# Patient Record
Sex: Female | Born: 1935 | Race: White | Hispanic: No | Smoking: Never smoker
Health system: Southern US, Community
[De-identification: ages and names within clinical notes are randomized; demographics above are authoritative.]

## PROBLEM LIST (undated history)

## (undated) DIAGNOSIS — I951 Orthostatic hypotension: Secondary | ICD-10-CM

## (undated) DIAGNOSIS — I10 Essential (primary) hypertension: Secondary | ICD-10-CM

## (undated) DIAGNOSIS — M549 Dorsalgia, unspecified: Secondary | ICD-10-CM

## (undated) DIAGNOSIS — F32A Depression, unspecified: Secondary | ICD-10-CM

## (undated) DIAGNOSIS — K5900 Constipation, unspecified: Secondary | ICD-10-CM

## (undated) DIAGNOSIS — E785 Hyperlipidemia, unspecified: Secondary | ICD-10-CM

## (undated) DIAGNOSIS — R350 Frequency of micturition: Secondary | ICD-10-CM

## (undated) DIAGNOSIS — C4491 Basal cell carcinoma of skin, unspecified: Secondary | ICD-10-CM

## (undated) DIAGNOSIS — F419 Anxiety disorder, unspecified: Secondary | ICD-10-CM

## (undated) DIAGNOSIS — M1990 Unspecified osteoarthritis, unspecified site: Secondary | ICD-10-CM

## (undated) DIAGNOSIS — T8859XA Other complications of anesthesia, initial encounter: Secondary | ICD-10-CM

## (undated) DIAGNOSIS — F329 Major depressive disorder, single episode, unspecified: Secondary | ICD-10-CM

## (undated) HISTORY — PX: FRACTURE SURGERY: SHX138

## (undated) HISTORY — DX: Depression, unspecified: F32.A

## (undated) HISTORY — DX: Major depressive disorder, single episode, unspecified: F32.9

## (undated) HISTORY — DX: Essential (primary) hypertension: I10

## (undated) HISTORY — DX: Hyperlipidemia, unspecified: E78.5

## (undated) HISTORY — DX: Anxiety disorder, unspecified: F41.9

## (undated) HISTORY — DX: Constipation, unspecified: K59.00

## (undated) HISTORY — DX: Dorsalgia, unspecified: M54.9

## (undated) HISTORY — PX: CATARACT EXTRACTION W/ INTRAOCULAR LENS  IMPLANT, BILATERAL: SHX1307

## (undated) HISTORY — DX: Orthostatic hypotension: I95.1

## (undated) HISTORY — PX: DILATION AND CURETTAGE OF UTERUS: SHX78

---

## 1996-01-06 HISTORY — PX: ANKLE FRACTURE SURGERY: SHX122

## 2003-01-06 HISTORY — PX: WRIST FRACTURE SURGERY: SHX121

## 2003-10-09 ENCOUNTER — Ambulatory Visit: Payer: Self-pay | Admitting: Ophthalmology

## 2004-10-07 ENCOUNTER — Ambulatory Visit: Payer: Self-pay | Admitting: Ophthalmology

## 2010-04-09 ENCOUNTER — Emergency Department: Payer: Self-pay | Admitting: Unknown Physician Specialty

## 2010-05-12 ENCOUNTER — Ambulatory Visit: Payer: Self-pay | Admitting: Obstetrics and Gynecology

## 2010-06-06 DIAGNOSIS — I951 Orthostatic hypotension: Secondary | ICD-10-CM

## 2010-06-06 HISTORY — DX: Orthostatic hypotension: I95.1

## 2010-06-11 ENCOUNTER — Ambulatory Visit: Payer: Self-pay | Admitting: Internal Medicine

## 2010-08-01 LAB — HM MAMMOGRAPHY: HM Mammogram: NORMAL

## 2010-08-29 ENCOUNTER — Other Ambulatory Visit: Payer: Self-pay | Admitting: Internal Medicine

## 2010-08-29 NOTE — Telephone Encounter (Signed)
OK to refill hydroxyzine HCL 25 mg one tablet every 8 hours as needed for itching  #90 3 refills

## 2010-08-29 NOTE — Telephone Encounter (Signed)
Patient called and stated you prescribed her hydroxyzine 25 mg but she would rather have hydroxyzine HCL that her previous doctor prescribed.  She stated she doesn't like the way the other made her feel and the HCL was much better.  Please advise

## 2010-09-01 MED ORDER — HYDROXYZINE HCL 25 MG PO TABS: 25.0000 mg | ORAL_TABLET | Freq: Three times a day (TID) | ORAL | Status: AC | PRN

## 2010-09-01 NOTE — Telephone Encounter (Signed)
Rx has been called in  

## 2010-09-01 NOTE — Telephone Encounter (Signed)
Addended by: Darletta Moll A on: 09/01/2010 03:52 PM   Modules accepted: Orders

## 2010-09-18 ENCOUNTER — Encounter: Payer: Self-pay | Admitting: Internal Medicine

## 2010-09-18 ENCOUNTER — Ambulatory Visit (INDEPENDENT_AMBULATORY_CARE_PROVIDER_SITE_OTHER): Payer: Medicare Other | Admitting: Internal Medicine

## 2010-09-18 VITALS — BP 156/126 | HR 67 | Temp 97.7°F | Resp 16 | Wt 123.0 lb

## 2010-09-18 DIAGNOSIS — F411 Generalized anxiety disorder: Secondary | ICD-10-CM

## 2010-09-18 DIAGNOSIS — G252 Other specified forms of tremor: Secondary | ICD-10-CM

## 2010-09-18 DIAGNOSIS — I10 Essential (primary) hypertension: Secondary | ICD-10-CM | POA: Insufficient documentation

## 2010-09-18 DIAGNOSIS — R030 Elevated blood-pressure reading, without diagnosis of hypertension: Secondary | ICD-10-CM

## 2010-09-18 DIAGNOSIS — F419 Anxiety disorder, unspecified: Secondary | ICD-10-CM

## 2010-09-18 DIAGNOSIS — R251 Tremor, unspecified: Secondary | ICD-10-CM

## 2010-09-18 DIAGNOSIS — M533 Sacrococcygeal disorders, not elsewhere classified: Secondary | ICD-10-CM

## 2010-09-18 DIAGNOSIS — R259 Unspecified abnormal involuntary movements: Secondary | ICD-10-CM

## 2010-09-18 DIAGNOSIS — I951 Orthostatic hypotension: Secondary | ICD-10-CM

## 2010-09-18 MED ORDER — CLORAZEPATE DIPOTASSIUM 7.5 MG PO TABS: ORAL_TABLET | ORAL | Status: DC

## 2010-09-18 NOTE — Progress Notes (Signed)
  Subjective:    Patient ID: [name], female    DOB: 25-May-1926, 77 y.o.   MRN: [id number]  HPI  77 yo white femalewith recent workup for isolated syncopal event which occurred in the setting of recent physical exhaustion, normal workup except for mild dehydration and chronic orthostasis, presents with fatigue (chronic) and difficulty rising from the recliner at home and occasional loss of balance without falls.  No recurrent syncope,  No dragging one foot. Also reporting left lateral thigh pain, with no recent fall but relates a history of remote fall onto hip 30 yrs ago without fracture.   Past Medical History  Diagnosis Date  . Chronic orthostatic hypotension June 2012    with pprior syncopal event,  did not tolelrate florinef trial  . Anxiety     No current outpatient prescriptions on file prior to visit.     Review of Systems  Constitutional: Positive for fatigue. Negative for fever, chills and unexpected weight change.  HENT: Negative for hearing loss, ear pain, nosebleeds, congestion, sore throat, facial swelling, rhinorrhea, sneezing, mouth sores, trouble swallowing, neck pain, neck stiffness, voice change, postnasal drip, sinus pressure, tinnitus and ear discharge.   Eyes: Negative for pain, discharge, redness and visual disturbance.  Respiratory: Negative for cough, chest tightness, shortness of breath, wheezing and stridor.   Cardiovascular: Negative for chest pain, palpitations and leg swelling.  Musculoskeletal: Positive for arthralgias and gait problem. Negative for myalgias.  Skin: Negative for color change and rash.  Neurological: Positive for tremors and light-headedness. Negative for dizziness, weakness and headaches.  Hematological: Negative for adenopathy.  Psychiatric/Behavioral: The patient is nervous/anxious.    BP 156/126  Pulse 67  Temp(Src) 97.7 F (36.5 C) (Oral)  Resp 16  Wt 123 lb (55.792 kg)  SpO2 98%     Objective:   Physical Exam    Constitutional: She is oriented to person, place, and time. She appears well-developed and well-nourished.  HENT:  Mouth/Throat: Oropharynx is clear and moist.  Eyes: EOM are normal. Pupils are equal, round, and reactive to light. No scleral icterus.  Neck: Normal range of motion. Neck supple. No JVD present. No thyromegaly present.  Cardiovascular: Normal rate, regular rhythm, normal heart sounds and intact distal pulses.   Pulmonary/Chest: Effort normal and breath sounds normal.  Abdominal: Soft. Bowel sounds are normal. She exhibits no mass. There is no tenderness.  Musculoskeletal: Normal range of motion. She exhibits no edema.       Right shoulder: She exhibits normal range of motion, no tenderness, no swelling, no effusion, no crepitus, no deformity, no pain, no spasm and normal strength.       Right hip: She exhibits normal range of motion, normal strength, no swelling and no laceration.       Left hip: She exhibits normal range of motion, no swelling, no deformity and no laceration.  Lymphadenopathy:    She has no cervical adenopathy.  Neurological: She is alert and oriented to person, place, and time. She displays tremor. She displays normal reflexes. No cranial nerve deficit. Coordination normal.       Tremor noted of head and hands  Skin: Skin is warm and dry.  Psychiatric: She has a normal mood and affect.          Assessment & Plan:

## 2010-09-18 NOTE — Patient Instructions (Signed)
You should only use the clorazepate as needed for anxiety or insomnia, not more than 2 pills in 24 hours.  The hydroxyzine is for itching but also causes sedation.     Your hip pain appears to be coming from your sacroiliac joint having arthritis. You may use up to 4 or 6 tylenol daily and either one Alleve daily  or 2 ibuprofen every 8 hours (but not both).  Your tremor and your balance may be due to anxiety or possibly from Parkinsons's Disease and if you would like to see a neurologist we will schedule you to see one.

## 2010-09-21 ENCOUNTER — Encounter: Payer: Self-pay | Admitting: Internal Medicine

## 2010-09-21 DIAGNOSIS — I951 Orthostatic hypotension: Secondary | ICD-10-CM | POA: Insufficient documentation

## 2010-09-21 DIAGNOSIS — F419 Anxiety disorder, unspecified: Secondary | ICD-10-CM | POA: Insufficient documentation

## 2010-09-21 NOTE — Assessment & Plan Note (Signed)
She did not tolerate prior trial of Florinef.  Recurrent measurements show drops in systolic but not below 100, and she has had no recurrence of syncope.

## 2010-09-21 NOTE — Assessment & Plan Note (Addendum)
aggravated by her care of ailing husband.  Dicussed trial of SSRI but she is not interested and prefers to use Librium pr insomnia .

## 2010-09-21 NOTE — Assessment & Plan Note (Signed)
Her neurologic and MSK exam is essentially normal except for her coarse tremor of head and hands.  Given her reports of loss of balance, we discussed the possibility that she may be developing Parkinson's' Disease and a Neurology evaluation was offered but deferred.

## 2010-10-15 ENCOUNTER — Encounter: Payer: Self-pay | Admitting: Internal Medicine

## 2010-10-20 ENCOUNTER — Telehealth: Payer: Self-pay | Admitting: Internal Medicine

## 2010-10-20 NOTE — Telephone Encounter (Signed)
Tried calling patient, but line was busy.  Will try again later.

## 2010-10-20 NOTE — Telephone Encounter (Signed)
Pt called wanted appointment gave her 10/28/10 @ 2:15.  Pt wants early in sooner.  She unsteady on her feet, wobbly when walking balance off, has problems with memory.  Both symptons has gotten worse in last month.  Pt has had a small tia in April.  Is it possible to see her earlier than 10/23

## 2010-10-28 ENCOUNTER — Ambulatory Visit (INDEPENDENT_AMBULATORY_CARE_PROVIDER_SITE_OTHER): Payer: Medicare Other | Admitting: Internal Medicine

## 2010-10-28 ENCOUNTER — Encounter: Payer: Self-pay | Admitting: Internal Medicine

## 2010-10-28 DIAGNOSIS — E785 Hyperlipidemia, unspecified: Secondary | ICD-10-CM

## 2010-10-28 DIAGNOSIS — R5383 Other fatigue: Secondary | ICD-10-CM

## 2010-10-28 DIAGNOSIS — M533 Sacrococcygeal disorders, not elsewhere classified: Secondary | ICD-10-CM

## 2010-10-28 DIAGNOSIS — R5381 Other malaise: Secondary | ICD-10-CM

## 2010-10-28 DIAGNOSIS — R030 Elevated blood-pressure reading, without diagnosis of hypertension: Secondary | ICD-10-CM

## 2010-10-28 DIAGNOSIS — I951 Orthostatic hypotension: Secondary | ICD-10-CM

## 2010-10-28 NOTE — Progress Notes (Signed)
Subjective:    Patient ID: [name], female    DOB: May 15, 1926, 77 y.o.   MRN: [id number]  HPI  77 yo white femae with 6 to 8l month history of dizziness and fatigue,  Originally found to be dehydrated, orthostatic,  workup done previously without cardiology evaluation given normal cardiac exam, presents with continued fatigue. " I feel weak and give out."   She has persistent mild back and hip pain which radiates down the back of her leg but not below the knee.  Aggravated by prlonged stopping or kneeling associated with yard work and  [employer], which she continues,  Including cutting up wood in the yard. . She also reports persistent anxiety over the health of her husband and is using clorazepate once during the day and again in the evening for insomna.  She denies chest pain , cough, orthopnea, edema, and shortness of breath.  No weight loss, fevers or night sweats.   Past Medical History  Diagnosis Date  . Chronic orthostatic hypotension June 2012    with pprior syncopal event,  did not tolelrate florinef trial  . Anxiety     Current Outpatient Prescriptions on File Prior to Visit  Medication Sig Dispense Refill  . aspirin 81 MG tablet Take 81 mg by mouth daily.        . clorazepate (TRANXENE) 7.5 MG tablet Take 1.5 tablets daily and at bedtime as needed for anxiety  90 tablet  2  . hydrOXYzine (ATARAX) 25 MG tablet Take 25 mg by mouth every 6 (six) hours as needed.        . Iron-Vitamins (GERITOL PO) Take by mouth.          Review of Systems  Constitutional: Positive for fatigue. Negative for fever, chills and unexpected weight change.  HENT: Negative for hearing loss, ear pain, nosebleeds, congestion, sore throat, facial swelling, rhinorrhea, sneezing, mouth sores, trouble swallowing, neck pain, neck stiffness, voice change, postnasal drip, sinus pressure, tinnitus and ear discharge.   Eyes: Negative for pain, discharge, redness and visual disturbance.  Respiratory: Negative for  cough, chest tightness, shortness of breath, wheezing and stridor.   Cardiovascular: Negative for chest pain, palpitations and leg swelling.  Musculoskeletal: Positive for back pain. Negative for myalgias and arthralgias.  Skin: Negative for color change and rash.  Neurological: Negative for dizziness, weakness, light-headedness and headaches.  Hematological: Negative for adenopathy.  Psychiatric/Behavioral: Positive for sleep disturbance. The patient is nervous/anxious.        Objective:   Physical Exam  Constitutional: She is oriented to person, place, and time. She appears well-developed and well-nourished.  HENT:  Mouth/Throat: Oropharynx is clear and moist.  Eyes: EOM are normal. Pupils are equal, round, and reactive to light. No scleral icterus.  Neck: Normal range of motion. Neck supple. No JVD present. No thyromegaly present.  Cardiovascular: Normal rate, regular rhythm, normal heart sounds and intact distal pulses.   Pulmonary/Chest: Effort normal and breath sounds normal.  Abdominal: Soft. Bowel sounds are normal. She exhibits no mass. There is no tenderness.  Musculoskeletal: Normal range of motion. She exhibits no edema.  Lymphadenopathy:    She has no cervical adenopathy.  Neurological: She is alert and oriented to person, place, and time.  Skin: Skin is warm and dry.  Psychiatric: She has a normal mood and affect.       Assessment & Plan:  Fatigue:  Thyroid, anemia, liver and kidney dysfunction ruled out.  Given persistent symptoms will refer to Cardiology  for evaluation.   Hyperlipidemia:  She is tolerating pravastatin started at last vist 3 months ago without myalgias.  Repeat lipids are due.

## 2010-10-28 NOTE — Patient Instructions (Signed)
Return this week for fasting lipids  We will refer you to Dr. Mariah Milling for a heart evaluation

## 2010-10-29 ENCOUNTER — Other Ambulatory Visit (INDEPENDENT_AMBULATORY_CARE_PROVIDER_SITE_OTHER): Payer: Medicare Other | Admitting: *Deleted

## 2010-10-29 DIAGNOSIS — E785 Hyperlipidemia, unspecified: Secondary | ICD-10-CM

## 2010-10-29 DIAGNOSIS — R5383 Other fatigue: Secondary | ICD-10-CM

## 2010-10-29 DIAGNOSIS — R5381 Other malaise: Secondary | ICD-10-CM

## 2010-10-29 LAB — COMPREHENSIVE METABOLIC PANEL
ALT: 13 U/L (ref 0–35)
AST: 23 U/L (ref 0–37)
Albumin: 4.1 g/dL (ref 3.5–5.2)
Alkaline Phosphatase: 44 U/L (ref 39–117)
BUN: 13 mg/dL (ref 6–23)
CO2: 30 mEq/L (ref 19–32)
Calcium: 9.6 mg/dL (ref 8.4–10.5)
Chloride: 106 mEq/L (ref 96–112)
Creatinine, Ser: 0.9 mg/dL (ref 0.4–1.2)
GFR: 63.73 mL/min (ref >60.00)
Glucose, Bld: 96 mg/dL (ref 70–99)
Potassium: 4.6 mEq/L (ref 3.5–5.1)
Sodium: 141 mEq/L (ref 135–145)
Total Bilirubin: 0.6 mg/dL (ref 0.3–1.2)
Total Protein: 6.7 g/dL (ref 6.0–8.3)

## 2010-10-29 LAB — LIPID PANEL
Cholesterol: 248 mg/dL — ABNORMAL HIGH (ref 0–200)
HDL: 58.3 mg/dL (ref >39.00)
Total CHOL/HDL Ratio: 4
Triglycerides: 111 mg/dL (ref 0.0–149.0)
VLDL: 22.2 mg/dL (ref 0.0–40.0)

## 2010-10-29 LAB — CBC WITH DIFFERENTIAL/PLATELET
Basophils Absolute: 0 10*3/uL (ref 0.0–0.1)
Basophils Relative: 0.5 % (ref 0.0–3.0)
Eosinophils Absolute: 0.1 10*3/uL (ref 0.0–0.7)
Eosinophils Relative: 2.8 % (ref 0.0–5.0)
HCT: 38.8 % (ref 36.0–46.0)
Hemoglobin: 13.3 g/dL (ref 12.0–15.0)
Lymphocytes Relative: 33.3 % (ref 12.0–46.0)
Lymphs Abs: 1.6 10*3/uL (ref 0.7–4.0)
MCHC: 34.3 g/dL (ref 30.0–36.0)
MCV: 96.2 fl (ref 78.0–100.0)
Monocytes Absolute: 0.4 10*3/uL (ref 0.1–1.0)
Monocytes Relative: 8.3 % (ref 3.0–12.0)
Neutro Abs: 2.6 10*3/uL (ref 1.4–7.7)
Neutrophils Relative %: 55.1 % (ref 43.0–77.0)
Platelets: 221 10*3/uL (ref 150.0–400.0)
RBC: 4.03 Mil/uL (ref 3.87–5.11)
RDW: 13.4 % (ref 11.5–14.6)
WBC: 4.7 10*3/uL (ref 4.5–10.5)

## 2010-10-29 LAB — LDL CHOLESTEROL, DIRECT: Direct LDL: 174.6 mg/dL

## 2010-10-29 LAB — TSH: TSH: 1.78 u[IU]/mL (ref 0.35–5.50)

## 2010-10-30 ENCOUNTER — Other Ambulatory Visit: Payer: Self-pay | Admitting: *Deleted

## 2010-10-30 MED ORDER — PRAVASTATIN SODIUM 20 MG PO TABS: 20.0000 mg | ORAL_TABLET | Freq: Every day | ORAL | Status: DC

## 2010-11-08 ENCOUNTER — Encounter: Payer: Self-pay | Admitting: Internal Medicine

## 2010-11-08 ENCOUNTER — Telehealth: Payer: Self-pay | Admitting: Internal Medicine

## 2010-11-08 DIAGNOSIS — M533 Sacrococcygeal disorders, not elsewhere classified: Secondary | ICD-10-CM | POA: Insufficient documentation

## 2010-11-08 DIAGNOSIS — E785 Hyperlipidemia, unspecified: Secondary | ICD-10-CM | POA: Insufficient documentation

## 2010-11-08 NOTE — Assessment & Plan Note (Signed)
She has no weakness on exam, is able to rise from a chair without using her hands, and has a negative straight leg lift .  Continue tyleno and aleve for symptoms.

## 2010-11-08 NOTE — Assessment & Plan Note (Addendum)
Pravastatin was started in June for LDL of 166 .  Repeat lipids suggeast that she has not been taking it as prescribed.  Will review with patient and increase dose if she is actually taking it

## 2010-11-08 NOTE — Telephone Encounter (Signed)
Her cholesterol is even higher, has she been taking the pravastatin daily as prescribed?  Also has she receoved a call about the referral to Dr. Mariah Milling for a cardioogy evaluation?

## 2010-11-08 NOTE — Assessment & Plan Note (Signed)
She did not tolerate a trial of florinef several months ago and has had no more syncopal episodes,  AM cortisol was normal.

## 2010-11-08 NOTE — Assessment & Plan Note (Signed)
Repeat bp today is normal.  

## 2010-11-10 NOTE — Telephone Encounter (Signed)
Patient notified. She says that she has been taking the pravastatin daily. Also she has received the call for the referral and her appt is tomorrow with Dr. Mariah Milling.

## 2010-11-11 ENCOUNTER — Ambulatory Visit (INDEPENDENT_AMBULATORY_CARE_PROVIDER_SITE_OTHER): Payer: Medicare Other | Admitting: Cardiovascular Disease

## 2010-11-11 ENCOUNTER — Encounter: Payer: Self-pay | Admitting: Cardiovascular Disease

## 2010-11-11 VITALS — BP 130/60 | HR 78 | Ht 68.0 in | Wt 125.0 lb

## 2010-11-11 DIAGNOSIS — R55 Syncope and collapse: Secondary | ICD-10-CM

## 2010-11-11 DIAGNOSIS — E785 Hyperlipidemia, unspecified: Secondary | ICD-10-CM

## 2010-11-11 NOTE — Patient Instructions (Signed)
You are doing well. No medication changes were made.  Please call us if you have new issues that need to be addressed before your next appt.  The office will contact you for a follow up Appt. In 6 months  

## 2010-11-12 DIAGNOSIS — R55 Syncope and collapse: Secondary | ICD-10-CM | POA: Insufficient documentation

## 2010-11-12 DIAGNOSIS — E785 Hyperlipidemia, unspecified: Secondary | ICD-10-CM | POA: Insufficient documentation

## 2010-11-12 NOTE — Assessment & Plan Note (Signed)
We have encouraged her to stay on the statin given total chol. >200

## 2010-11-12 NOTE — Progress Notes (Signed)
Patient ID: [name], female    DOB: September 08, 1926, 77 y.o.   MRN: [id number]  HPI Comments: [name] is a please 77 yo woman, patient of Dr. Darrick Huntsman, with a hx of hyperlipidemia, who is very active at baseline, with a hx of near syncope after standing up from her breakfast table, who presents for further evaluation.  She reports that she "works hard", and frequently mows, and helps to cut trees among other activities. It was during a period over several days, that she had been very active that she had her near syncope epsiode. She does report having minimal po fluid intake. She had been taking allergy pills for a "feeling that her head felt big". She also reports losing weight, approximately 10 lbs. She reports eating, though "probably not enough". She attributes some of the weight loss to working hard. She stood up from the dinning room table and almost "blacked out". She sat back down and felt sweaty with left tingling. Symptoms quickly passed. She went to urgent care and was told that she had a UTI.    She does have  has persistent mild back and hip pain which radiates down the back of her leg. . She also reports anxiety over the health of her husband.  She denies chest pain , cough, orthopnea, edema, and shortness of breath.  No weight loss, fevers or night sweats.   EKG shows NSR with no significant ST or T wave changes    Outpatient Encounter Prescriptions as of 11/11/2010  Medication Sig Dispense Refill  . aspirin 81 MG tablet Take 81 mg by mouth daily.        . clorazepate (TRANXENE) 7.5 MG tablet Take 1.5 tablets daily and at bedtime as needed for anxiety  90 tablet  2  . Ginkgo Biloba 120 MG CAPS Take 120 mg by mouth daily.        . hydrOXYzine (ATARAX) 25 MG tablet Take 25 mg by mouth every 6 (six) hours as needed.        . Iron-Vitamins (GERITOL PO) Take by mouth.        . Lutein 20 MG CAPS Take 20 mg by mouth daily.        . magnesium oxide (MAG-OX) 400 MG tablet Take 400 mg by  mouth daily.        [name] POTASSIUM GLUCONATE PO Take by mouth.        . pravastatin (PRAVACHOL) 20 MG tablet Take 1 tablet (20 mg total) by mouth daily.  90 tablet  2  . vitamin B-12 (CYANOCOBALAMIN) 1000 MCG tablet Take 1,000 mcg by mouth daily.        . vitamin E 400 UNIT capsule Take 400 Units by mouth daily.           Review of Systems  Constitutional: Negative.   HENT: Negative.   Eyes: Negative.   Respiratory: Negative.   Cardiovascular: Negative.   Gastrointestinal: Negative.   Musculoskeletal: Negative.   Skin: Negative.   Neurological:       Near syncope  Hematological: Negative.   Psychiatric/Behavioral: Negative.   All other systems reviewed and are negative.    BP 130/60  Pulse 78  Ht 5\' 8"  (1.727 m)  Wt 125 lb (56.7 kg)  BMI 19.01 kg/m2   Physical Exam  Nursing note and vitals reviewed. Constitutional: She is oriented to person, place, and time. She appears well-developed and well-nourished.  HENT:  Head: Normocephalic.  Nose: Nose normal.  Mouth/Throat: Oropharynx is clear and moist.  Eyes: Conjunctivae are normal. Pupils are equal, round, and reactive to light.  Neck: Normal range of motion. Neck supple. No JVD present.  Cardiovascular: Normal rate, regular rhythm, S1 normal, S2 normal, normal heart sounds and intact distal pulses.  Exam reveals no gallop and no friction rub.   No murmur heard. Pulmonary/Chest: Effort normal and breath sounds normal. No respiratory distress. She has no wheezes. She has no rales. She exhibits no tenderness.  Abdominal: Soft. Bowel sounds are normal. She exhibits no distension. There is no tenderness.  Musculoskeletal: Normal range of motion. She exhibits no edema and no tenderness.  Lymphadenopathy:    She has no cervical adenopathy.  Neurological: She is alert and oriented to person, place, and time. Coordination normal.  Skin: Skin is warm and dry. No rash noted. No erythema.  Psychiatric: She has a normal mood and  affect. Her behavior is normal. Judgment and thought content normal.         Assessment and Plan

## 2010-11-12 NOTE — Assessment & Plan Note (Signed)
Previous episode of near syncope. No further episodes.We have suggested he try to stay more hydrated. She is not conscious of her fluids or eating enough calories (recent weight loss). We have enouraged her that if she would like to stay as active as she has been, that she will need to take time to eat and drink, avoid over-doing it.  If she has additional episodes, we could consider additional workup including echo/holter.

## 2010-11-14 ENCOUNTER — Ambulatory Visit: Payer: Medicare Other | Admitting: Internal Medicine

## 2010-11-25 ENCOUNTER — Ambulatory Visit (INDEPENDENT_AMBULATORY_CARE_PROVIDER_SITE_OTHER): Payer: Medicare Other | Admitting: Internal Medicine

## 2010-11-25 ENCOUNTER — Encounter: Payer: Self-pay | Admitting: Internal Medicine

## 2010-11-25 VITALS — BP 146/68 | HR 70 | Temp 98.1°F | Resp 16 | Ht 67.0 in | Wt 127.2 lb

## 2010-11-25 DIAGNOSIS — R413 Other amnesia: Secondary | ICD-10-CM

## 2010-11-25 DIAGNOSIS — F419 Anxiety disorder, unspecified: Secondary | ICD-10-CM

## 2010-11-25 DIAGNOSIS — F411 Generalized anxiety disorder: Secondary | ICD-10-CM

## 2010-11-25 DIAGNOSIS — M79602 Pain in left arm: Secondary | ICD-10-CM

## 2010-11-25 DIAGNOSIS — M79609 Pain in unspecified limb: Secondary | ICD-10-CM

## 2010-11-25 NOTE — Patient Instructions (Signed)
I do recommend that you consider therapy with paxil or zoloft to help  your anxiety   I have you given you samples of Pristiq to see if an antidepressant will help.  Take it daily for one month,  Unless it makes you feel bad.   If it helps,  We will transition to a generic but similar medication (paxil or zoloft)   It would be ok to take and additional half of clorazepate if you wake up at 1:30 am with insomnia.  If your chest or arm symptomss become troublesome,  We will schedule a stress test.

## 2010-11-25 NOTE — Progress Notes (Signed)
Subjective:    Patient ID: [name], female    DOB: Mar 12, 1926, 77 y.o.   MRN: [id number]  HPI  [name] returns to discuss her concerns about her feelings of "craziness."  She was recently evaluated by Dr. Mariah Milling for persistent fatigue and presyncope, given her normal noncardiac workup int he past year. He agrreed that in the setting of vigorous outdoor activity and advance age, dehydration was likely a major componenet in her symptoms and deferred cardiac stress test and ECHO for now.  Patient now states that she has had an isolated recent episode of left hand and arm pain that woke her from sleep , lasted 30 minutes, and resolved spontaneously.  Also reporting separate occasion  of chest numbness brought on by lying down on her right arm.     Regarding her "crazinness," she recalls an episode in 1998 of getting lost while driving to a neice's house from her home in [place] to [place].  She is worried that she may have had a blackout.  A 2nd episode occurred several years ago while driving with a friend and missing her turn while recounting a recent trip to  [facility].     She is anxious about her husband's health,  Which apparently is poor due to ischemic cardiomyopathy and leg weakness which has limited his ambulation and is presumed to be neuropathy.  Past Medical History  Diagnosis Date  . Chronic orthostatic hypotension June 2012    with pprior syncopal event,  did not tolelrate florinef trial  . Anxiety   . Hyperlipidemia     Current Outpatient Prescriptions on File Prior to Visit  Medication Sig Dispense Refill  . aspirin 81 MG tablet Take 81 mg by mouth daily.        . clorazepate (TRANXENE) 7.5 MG tablet Take 1.5 tablets daily and at bedtime as needed for anxiety  90 tablet  2  . Ginkgo Biloba 120 MG CAPS Take 120 mg by mouth daily.        . hydrOXYzine (ATARAX) 25 MG tablet Take 25 mg by mouth every 6 (six) hours as needed.        . Iron-Vitamins  (GERITOL PO) Take by mouth.        . Lutein 20 MG CAPS Take 20 mg by mouth daily.        . magnesium oxide (MAG-OX) 400 MG tablet Take 400 mg by mouth daily.        [name] POTASSIUM GLUCONATE PO Take by mouth.        . pravastatin (PRAVACHOL) 20 MG tablet Take 1 tablet (20 mg total) by mouth daily.  90 tablet  2  . vitamin B-12 (CYANOCOBALAMIN) 1000 MCG tablet Take 1,000 mcg by mouth daily.        . vitamin E 400 UNIT capsule Take 400 Units by mouth daily.          Review of Systems  Constitutional: Positive for fatigue. Negative for fever, chills and unexpected weight change.  HENT: Negative for hearing loss, ear pain, nosebleeds, congestion, sore throat, facial swelling, rhinorrhea, sneezing, mouth sores, trouble swallowing, neck pain, neck stiffness, voice change, postnasal drip, sinus pressure, tinnitus and ear discharge.   Eyes: Negative for pain, discharge, redness and visual disturbance.  Respiratory: Negative for cough, chest tightness, shortness of breath, wheezing and stridor.   Cardiovascular: Negative for chest pain, palpitations and leg swelling.  Musculoskeletal: Positive for back pain. Negative for myalgias and arthralgias.  Skin: Negative for color change and rash.  Neurological: Negative for dizziness, weakness, light-headedness and headaches.  Hematological: Negative for adenopathy.  Psychiatric/Behavioral: Positive for sleep disturbance. The patient is nervous/anxious.        Objective:   Physical Exam  Constitutional: She is oriented to person, place, and time. She appears well-developed and well-nourished.  HENT:  Mouth/Throat: Oropharynx is clear and moist.  Eyes: EOM are normal. Pupils are equal, round, and reactive to light. No scleral icterus.  Neck: Normal range of motion. Neck supple. No JVD present. No thyromegaly present.  Cardiovascular: Normal rate, regular rhythm, normal heart sounds and intact distal pulses.   Pulmonary/Chest: Effort normal and breath sounds  normal.  Abdominal: Soft. Bowel sounds are normal. She exhibits no mass. There is no tenderness.  Musculoskeletal: Normal range of motion. She exhibits no edema.  Lymphadenopathy:    She has no cervical adenopathy.  Neurological: She is alert and oriented to person, place, and time.  Skin: Skin is warm and dry.  Psychiatric: Her speech is normal and behavior is normal. Judgment and thought content normal. Her mood appears anxious. She exhibits abnormal recent memory.          Assessment & Plan:  Chest pain, arm pain:  Given her isolated episodes,  Generally healthy condition , and lack of symptoms brought on by chopping wood and moqwing grass, it is unlikely that her sympotms are due to angina or cervical steosis.  Addressed her as yet inadequately treated anxiety disorder.  She wil willing to try an antidepressant,  Samples of Pristiq for one month given.  Return in one month

## 2010-12-29 ENCOUNTER — Ambulatory Visit (INDEPENDENT_AMBULATORY_CARE_PROVIDER_SITE_OTHER): Payer: Medicare Other | Admitting: Internal Medicine

## 2010-12-29 ENCOUNTER — Encounter: Payer: Self-pay | Admitting: Internal Medicine

## 2010-12-29 ENCOUNTER — Telehealth: Payer: Self-pay | Admitting: *Deleted

## 2010-12-29 VITALS — BP 178/80 | HR 88 | Temp 97.5°F | Wt 125.0 lb

## 2010-12-29 DIAGNOSIS — R42 Dizziness and giddiness: Secondary | ICD-10-CM

## 2010-12-29 NOTE — Progress Notes (Signed)
Subjective:    Patient ID: [name], female    DOB: 1926/12/31, 77 y.o.   MRN: [id number]  HPI 77YO female with h/o orthostatic hypotension presents for acute visit c/o brief episode of dizziness this morning at 9am while she was standing and preparing cookies for baking. She reports she was standing when she suddenly felt lightheaded and dizzy with the room spinning. She notes that she felt hot and sweaty.  She leaned on the counter, then sat down with resolution of her symptoms in a few seconds. She denies any palpitations, chest pain, dyspnea, nausea during this time.  She has not had any recurrence of symptoms.  Over the last week, she has felt well. She has been out in her yard cutting down trees and mowing with a push mower. She hasn't noted any lightheadedness or other symptoms.  She notes that she may be dehydrated from her yard work and notes decreased intake of fluids while working outside. She also notes that recently, she has lit her home wood burning stove and this typically exacerbates her allergies. She has had trouble equalizing pressure in her ears recently.  She denies fever, chills, headache, visual changes, diarrhea, nausea, or other symptoms.   Review of Systems  Constitutional: Negative for fever, chills, appetite change, fatigue and unexpected weight change.  HENT: Negative for ear pain, congestion, sore throat, trouble swallowing, neck pain, voice change and sinus pressure.   Eyes: Negative for visual disturbance.  Respiratory: Negative for cough, shortness of breath, wheezing and stridor.   Cardiovascular: Negative for chest pain, palpitations and leg swelling.  Gastrointestinal: Negative for nausea, vomiting, abdominal pain, diarrhea, constipation, blood in stool, abdominal distention and anal bleeding.  Genitourinary: Negative for dysuria and flank pain.  Musculoskeletal: Negative for myalgias, arthralgias and gait problem.  Skin: Negative for color change and rash.    Neurological: Positive for dizziness. Negative for tremors, syncope, speech difficulty, weakness and headaches.  Hematological: Negative for adenopathy. Does not bruise/bleed easily.  Psychiatric/Behavioral: Negative for suicidal ideas, sleep disturbance and dysphoric mood. The patient is not nervous/anxious.    BP 178/80  Pulse 88  Temp(Src) 97.5 F (36.4 C) (Oral)  Wt 125 lb (56.7 kg)  SpO2 98%     Objective:   Physical Exam  Constitutional: She is oriented to person, place, and time. She appears well-developed and well-nourished. No distress.  HENT:  Head: Normocephalic and atraumatic.  Right Ear: External ear normal. A middle ear effusion is present.  Left Ear: External ear normal. A middle ear effusion is present.  Nose: Nose normal.  Mouth/Throat: Oropharynx is clear and moist. No oropharyngeal exudate.  Eyes: Conjunctivae are normal. Pupils are equal, round, and reactive to light. Right eye exhibits no discharge. Left eye exhibits no discharge. No scleral icterus.  Neck: Normal range of motion. Neck supple. No tracheal deviation present. No thyromegaly present.  Cardiovascular: Normal rate, regular rhythm, normal heart sounds and intact distal pulses.  Exam reveals no gallop and no friction rub.   No murmur heard. Pulmonary/Chest: Effort normal and breath sounds normal. No respiratory distress. She has no wheezes. She has no rales. She exhibits no tenderness.  Musculoskeletal: Normal range of motion. She exhibits no edema and no tenderness.  Lymphadenopathy:    She has no cervical adenopathy.  Neurological: She is alert and oriented to person, place, and time. No cranial nerve deficit. She exhibits normal muscle tone. Coordination normal.  Skin: Skin is warm and dry. No rash noted. She is  not diaphoretic. No erythema. No pallor.  Psychiatric: She has a normal mood and affect. Her behavior is normal. Judgment and thought content normal.          Assessment & Plan:  1.  Dizziness - Resolved. Suspect related to prolonged standing and orthostatic hypotension. Recent increase in allergic symptoms after starting wood-burning fireplace at home may also be playing a role. Note bilateral middle ear effusions on exam. She does have recent h/o TIA and we discussed options including evaluation with CT head versus monitoring for recurrent symptoms.  She would prefer to monitor symptoms for now.  She will start back on Benadryl to help with allergies. She will also increase po fluid intake.  If any recurrent dizziness, she will call back immediately, or go to ED for evaluation.

## 2010-12-29 NOTE — Telephone Encounter (Signed)
I spoke w/patient. She was standing at her kitchen counter this am and began to feel like her head was "spinning", she braced herself and then sat down. She began to feel nauseous and hot. Symptoms have resolved but she feels weak. I scheduled pt apt today for eval. NO CP or SOB.

## 2011-01-07 ENCOUNTER — Ambulatory Visit (INDEPENDENT_AMBULATORY_CARE_PROVIDER_SITE_OTHER): Payer: Medicare Other | Admitting: Internal Medicine

## 2011-01-07 ENCOUNTER — Encounter: Payer: Self-pay | Admitting: Internal Medicine

## 2011-01-07 DIAGNOSIS — I951 Orthostatic hypotension: Secondary | ICD-10-CM

## 2011-01-07 DIAGNOSIS — E785 Hyperlipidemia, unspecified: Secondary | ICD-10-CM

## 2011-01-07 DIAGNOSIS — G4700 Insomnia, unspecified: Secondary | ICD-10-CM | POA: Insufficient documentation

## 2011-01-07 MED ORDER — CHLORDIAZEPOXIDE HCL 10 MG PO CAPS: 10.0000 mg | ORAL_CAPSULE | Freq: Every day | ORAL | Status: DC | PRN

## 2011-01-07 NOTE — Assessment & Plan Note (Signed)
likely the cause of her recent dizziness, aggravated by prolonged standing/.  Reminded to increase her liquid intake.

## 2011-01-07 NOTE — Assessment & Plan Note (Signed)
Managed with pravastatin and aspirin.  Repeat lipids are due

## 2011-01-07 NOTE — Progress Notes (Signed)
Subjective:    Patient ID: [name], female    DOB: 18-Aug-1926, 78 y.o.   MRN: [id number]  HPI  Healthy 78 year old woman with chronic orthostatic hypotension  returns for followup after evaluation for a dizzy spell which occurred during an afternoon of cooking which has She has not had any recurrent spells, but is reporting some low back pain , brought a urine specimen from home fore evaluation and urle out UTI.  2nd issue is anxiety, which remains problematic.  She did not tolerate  Trial of priistiq due to increased anxiety.  Using clorazepate at bedtime for insomnia but it makes her dream a lot, which she finds unrestful, and causes dry mouth .  She also uses hdroxyzine dueing the day for anxiety.  Past Medical History  Diagnosis Date  . Chronic orthostatic hypotension June 2012    with pprior syncopal event,  did not tolelrate florinef trial  . Anxiety   . Hyperlipidemia    Current Outpatient Prescriptions on File Prior to Visit  Medication Sig Dispense Refill  . aspirin 81 MG tablet Take 81 mg by mouth daily.        . Ginkgo Biloba 120 MG CAPS Take 120 mg by mouth daily.        . hydrOXYzine (ATARAX) 25 MG tablet Take 25 mg by mouth every 6 (six) hours as needed.        . Iron-Vitamins (GERITOL PO) Take by mouth.        . Lutein 20 MG CAPS Take 20 mg by mouth daily.        . magnesium oxide (MAG-OX) 400 MG tablet Take 400 mg by mouth daily.        [name] POTASSIUM GLUCONATE PO Take by mouth.        . pravastatin (PRAVACHOL) 20 MG tablet Take 1 tablet (20 mg total) by mouth daily.  90 tablet  2  . vitamin B-12 (CYANOCOBALAMIN) 1000 MCG tablet Take 1,000 mcg by mouth daily.        . vitamin E 400 UNIT capsule Take 400 Units by mouth daily.          Review of Systems  Constitutional: Negative for fever, chills, appetite change, fatigue and unexpected weight change.  HENT: Negative for ear pain, congestion, sore throat, trouble swallowing, neck pain, voice change and sinus pressure.     Eyes: Negative for visual disturbance.  Respiratory: Negative for cough, shortness of breath, wheezing and stridor.   Cardiovascular: Negative for chest pain, palpitations and leg swelling.  Gastrointestinal: Negative for nausea, vomiting, abdominal pain, diarrhea, constipation, blood in stool, abdominal distention and anal bleeding.  Genitourinary: Negative for dysuria and flank pain.  Musculoskeletal: Positive for back pain. Negative for myalgias, arthralgias and gait problem.  Skin: Negative for color change and rash.  Neurological: Positive for dizziness. Negative for tremors, syncope, speech difficulty, weakness and headaches.  Hematological: Negative for adenopathy. Does not bruise/bleed easily.  Psychiatric/Behavioral: Negative for suicidal ideas, sleep disturbance and dysphoric mood. The patient is not nervous/anxious.        Objective:   Physical Exam  Constitutional: She is oriented to person, place, and time. She appears well-developed and well-nourished. No distress.  HENT:  Head: Normocephalic and atraumatic.  Right Ear: External ear normal. A middle ear effusion is present.  Left Ear: External ear normal. A middle ear effusion is present.  Nose: Nose normal.  Mouth/Throat: Oropharynx is clear and moist. No oropharyngeal exudate.  Eyes: Conjunctivae  are normal. Pupils are equal, round, and reactive to light. Right eye exhibits no discharge. Left eye exhibits no discharge. No scleral icterus.  Neck: Normal range of motion. Neck supple. No tracheal deviation present. No thyromegaly present.  Cardiovascular: Normal rate, regular rhythm, normal heart sounds and intact distal pulses.  Exam reveals no gallop and no friction rub.   No murmur heard. Pulmonary/Chest: Effort normal and breath sounds normal. No respiratory distress. She has no wheezes. She has no rales. She exhibits no tenderness.  Musculoskeletal: Normal range of motion. She exhibits no edema and no tenderness.   Lymphadenopathy:    She has no cervical adenopathy.  Neurological: She is alert and oriented to person, place, and time. No cranial nerve deficit. She exhibits normal muscle tone. Coordination normal.  Skin: Skin is warm and dry. No rash noted. She is not diaphoretic. No erythema. No pallor.  Psychiatric: She has a normal mood and affect. Her behavior is normal. Judgment and thought content normal.          Assessment & Plan:

## 2011-01-07 NOTE — Patient Instructions (Signed)
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

## 2011-01-07 NOTE — Assessment & Plan Note (Signed)
Secondary to anxiety aggravated by husband's health,  Did not tmultiple trials of SSRIs.  CTrial of  clordiazepoxide insteady of tranxene.

## 2011-02-04 ENCOUNTER — Telehealth: Payer: Self-pay | Admitting: *Deleted

## 2011-02-04 NOTE — Telephone Encounter (Signed)
Prior auth needed for librium, form is in red folder.

## 2011-02-05 ENCOUNTER — Encounter: Payer: Self-pay | Admitting: Internal Medicine

## 2011-02-05 ENCOUNTER — Ambulatory Visit (INDEPENDENT_AMBULATORY_CARE_PROVIDER_SITE_OTHER): Payer: Medicare Other | Admitting: Internal Medicine

## 2011-02-05 ENCOUNTER — Ambulatory Visit: Payer: Self-pay | Admitting: Internal Medicine

## 2011-02-05 VITALS — BP 160/60 | HR 84 | Temp 98.2°F | Wt 127.0 lb

## 2011-02-05 DIAGNOSIS — M549 Dorsalgia, unspecified: Secondary | ICD-10-CM

## 2011-02-05 DIAGNOSIS — R030 Elevated blood-pressure reading, without diagnosis of hypertension: Secondary | ICD-10-CM

## 2011-02-05 DIAGNOSIS — M533 Sacrococcygeal disorders, not elsewhere classified: Secondary | ICD-10-CM

## 2011-02-05 DIAGNOSIS — M5432 Sciatica, left side: Secondary | ICD-10-CM

## 2011-02-05 DIAGNOSIS — M5430 Sciatica, unspecified side: Secondary | ICD-10-CM

## 2011-02-05 NOTE — Progress Notes (Signed)
Subjective:    Patient ID: [name], female    DOB: [birth year], 78 y.o.   MRN: [id number]  HPI  [name] is a healthy 78 yr old  white female who presents today with history of recurrent persistent eft sacroiliac pain which radiates to left hip and left lateral leg.  Interfering with ambulation.  Has been hurting her for one year, intermittent in nature.  Her chiropractor Dr. Meredeth Ide did some  X rays of her lumbar spine recently which she has brought with her which  which are very abnormal secondary to loss of disc space almost will between L3 and L4.  Her pain is aggravated by squatting ,  But not by sitting or bending unless she is lifting something heavy.  She has been taking Tylenol and tried some of her husbands Percocet which apparently did nothing for her.  She is concerned that she will end up with spinal stenosis like her husband if she does not have intervention soon.  Past Medical History  Diagnosis Date  . Chronic orthostatic hypotension June 2012    with pprior syncopal event,  did not tolelrate florinef trial  . Anxiety   . Hyperlipidemia     Current Outpatient Prescriptions on File Prior to Visit  Medication Sig Dispense Refill  . aspirin 81 MG tablet Take 81 mg by mouth daily.        . chlordiazePOXIDE (LIBRIUM) 10 MG capsule Take 1 capsule (10 mg total) by mouth daily as needed for anxiety.  30 capsule  1  . Ginkgo Biloba 120 MG CAPS Take 120 mg by mouth daily.        . hydrOXYzine (ATARAX) 25 MG tablet Take 25 mg by mouth every 6 (six) hours as needed.        . Iron-Vitamins (GERITOL PO) Take by mouth.        . Lutein 20 MG CAPS Take 20 mg by mouth daily.        . magnesium oxide (MAG-OX) 400 MG tablet Take 400 mg by mouth daily.        [name] POTASSIUM GLUCONATE PO Take by mouth.        . pravastatin (PRAVACHOL) 20 MG tablet Take 1 tablet (20 mg total) by mouth daily.  90 tablet  2  . vitamin B-12 (CYANOCOBALAMIN) 1000 MCG tablet Take 1,000 mcg by mouth daily.        .  vitamin E 400 UNIT capsule Take 400 Units by mouth daily.          Review of Systems  Constitutional: Positive for fatigue. Negative for fever, chills and unexpected weight change.  HENT: Negative for hearing loss, ear pain, nosebleeds, congestion, sore throat, facial swelling, rhinorrhea, sneezing, mouth sores, trouble swallowing, neck pain, neck stiffness, voice change, postnasal drip, sinus pressure, tinnitus and ear discharge.   Eyes: Negative for pain, discharge, redness and visual disturbance.  Respiratory: Negative for cough, chest tightness, shortness of breath, wheezing and stridor.   Cardiovascular: Negative for chest pain, palpitations and leg swelling.  Musculoskeletal: Positive for back pain and arthralgias. Negative for myalgias.  Skin: Negative for color change and rash.  Neurological: Negative for dizziness, weakness, light-headedness and headaches.  Hematological: Negative for adenopathy.  Psychiatric/Behavioral: Positive for sleep disturbance.       Objective:   Physical Exam  Constitutional: She is oriented to person, place, and time. She appears well-developed and well-nourished.  HENT:  Mouth/Throat: Oropharynx is clear and moist.  Eyes: EOM are  normal. Pupils are equal, round, and reactive to light. No scleral icterus.  Neck: Normal range of motion. Neck supple. No JVD present. No thyromegaly present.  Cardiovascular: Normal rate, regular rhythm, normal heart sounds and intact distal pulses.   Pulmonary/Chest: Effort normal and breath sounds normal.  Abdominal: Soft. Bowel sounds are normal. She exhibits no mass. There is no tenderness.  Musculoskeletal: Normal range of motion. She exhibits no edema and no tenderness.  Lymphadenopathy:    She has no cervical adenopathy.  Neurological: She is alert and oriented to person, place, and time. She has normal reflexes.  Skin: Skin is warm and dry.  Psychiatric: She has a normal mood and affect.      Assessment &  Plan:   Back pain, sacroiliac Her lumbar spine films are quite abnormal with almost total loss of disc space between L3 and L4. Her pain radiates into her left hip and down her left thigh. Will order an MRI to rule out spinal stenosis and nerve root compromise. She is agreeable to neurosurgical evaluation if these conditions are found. Urinalysis was ordered to rule out pyelonephritis as a cause of her back pain per patient request.  Elevated blood pressure reading without diagnosis of hypertension She has had 2 readings now which were elevated. Repeat blood pressure was 140/70. She has a history of orthostatic hypotension but did not tolerate trial of Florinef. I am hesitant to treat her with her elevated readings given her persistent symptoms with change in position. I will have her check her blood pressures at home to confirm that these readings are transient.    Updated Medication List Outpatient Encounter Prescriptions as of 02/05/2011  Medication Sig Dispense Refill  . aspirin 81 MG tablet Take 81 mg by mouth daily.        . chlordiazePOXIDE (LIBRIUM) 10 MG capsule Take 1 capsule (10 mg total) by mouth daily as needed for anxiety.  30 capsule  1  . Ginkgo Biloba 120 MG CAPS Take 120 mg by mouth daily.        . hydrOXYzine (ATARAX) 25 MG tablet Take 25 mg by mouth every 6 (six) hours as needed.        . Iron-Vitamins (GERITOL PO) Take by mouth.        . Lutein 20 MG CAPS Take 20 mg by mouth daily.        . magnesium oxide (MAG-OX) 400 MG tablet Take 400 mg by mouth daily.        [name] POTASSIUM GLUCONATE PO Take by mouth.        . pravastatin (PRAVACHOL) 20 MG tablet Take 1 tablet (20 mg total) by mouth daily.  90 tablet  2  . vitamin B-12 (CYANOCOBALAMIN) 1000 MCG tablet Take 1,000 mcg by mouth daily.        . vitamin E 400 UNIT capsule Take 400 Units by mouth daily.

## 2011-02-05 NOTE — Telephone Encounter (Signed)
Form completed and faxed back

## 2011-02-05 NOTE — Patient Instructions (Signed)
You pain may be due to both your lumbar spine and your left hip.   You may combine motrin with tylenol for pain relief,  Use 2 or 3 motrin and on tylenol every 6 hours as needed.    We will call you with an appt for the MRI of the spine and you can do the hip films today

## 2011-02-06 ENCOUNTER — Other Ambulatory Visit: Payer: Self-pay | Admitting: *Deleted

## 2011-02-06 NOTE — Telephone Encounter (Signed)
Faxed request from [facility], last filled 01/16/11.

## 2011-02-06 NOTE — Telephone Encounter (Signed)
Prior auth for librium approved, approval letter placed in doctor's office for signature and scanning.

## 2011-02-07 ENCOUNTER — Encounter: Payer: Self-pay | Admitting: Internal Medicine

## 2011-02-07 NOTE — Assessment & Plan Note (Addendum)
Her lumbar spine films are quite abnormal with almost total loss of disc space between L3 and L4. Her pain radiates into her left hip and down her left thigh. Will order an MRI to rule out spinal stenosis and nerve root compromise. She is agreeable to neurosurgical evaluation if these conditions are found. Urinalysis was ordered to rule out pyelonephritis as a cause of her back pain per patient request.

## 2011-02-07 NOTE — Assessment & Plan Note (Signed)
She has had 2 readings now which were elevated. Repeat blood pressure was 140/70. She has a history of orthostatic hypotension but did not tolerate trial of Florinef. I am hesitant to treat her with her elevated readings given her persistent symptoms with change in position. I will have her check her blood pressures at home to confirm that these readings are transient.

## 2011-02-09 NOTE — Telephone Encounter (Signed)
She has requested this refill a little early , which is why I have not filled it. Has she increased her use? She told me shw was taking 1/2 tablet once or twice daily.

## 2011-02-09 NOTE — Telephone Encounter (Signed)
Pt says she takes one whole capsule a day, she says these were last filled on 01/07/11.

## 2011-02-10 MED ORDER — CHLORDIAZEPOXIDE HCL 10 MG PO CAPS: 10.0000 mg | ORAL_CAPSULE | Freq: Every day | ORAL | Status: AC | PRN

## 2011-02-10 NOTE — Telephone Encounter (Signed)
Ok to refill 

## 2011-02-10 NOTE — Telephone Encounter (Signed)
Medicine called to pharmacy- [facility].

## 2011-02-14 ENCOUNTER — Ambulatory Visit: Payer: Self-pay | Admitting: Internal Medicine

## 2011-02-16 ENCOUNTER — Telehealth: Payer: Self-pay | Admitting: Internal Medicine

## 2011-02-16 DIAGNOSIS — M533 Sacrococcygeal disorders, not elsewhere classified: Secondary | ICD-10-CM

## 2011-02-16 NOTE — Assessment & Plan Note (Signed)
Multiple leves had degenerative changes on MRI Feb 2012  But no high grade spinal stenosis

## 2011-02-16 NOTE — Telephone Encounter (Signed)
Called patient, no answer 

## 2011-02-16 NOTE — Telephone Encounter (Signed)
Her MRI of lumbar spine showed degenerative changes a tmultiple levels but nothing severe enough to warrant surgical evaluation

## 2011-02-17 NOTE — Telephone Encounter (Signed)
Patient notified. Also patient scheduled appt with DR. Darrick Huntsman for Friday to discuss results and other options.

## 2011-02-17 NOTE — Telephone Encounter (Signed)
Pt called out mri results [phone]

## 2011-02-20 ENCOUNTER — Encounter: Payer: Self-pay | Admitting: Internal Medicine

## 2011-02-20 ENCOUNTER — Telehealth: Payer: Self-pay | Admitting: *Deleted

## 2011-02-20 ENCOUNTER — Ambulatory Visit (INDEPENDENT_AMBULATORY_CARE_PROVIDER_SITE_OTHER): Payer: Medicare Other | Admitting: Internal Medicine

## 2011-02-20 DIAGNOSIS — M533 Sacrococcygeal disorders, not elsewhere classified: Secondary | ICD-10-CM

## 2011-02-20 DIAGNOSIS — G4700 Insomnia, unspecified: Secondary | ICD-10-CM

## 2011-02-20 MED ORDER — MELOXICAM 15 MG PO TABS: 15.0000 mg | ORAL_TABLET | Freq: Every day | ORAL | Status: DC

## 2011-02-20 NOTE — Telephone Encounter (Signed)
Pt called to let you know that she is taking clorazepate 7.5 mg's, says she was told to call back with this information.

## 2011-02-20 NOTE — Patient Instructions (Signed)
Your MRI showed that your vertebrae have degenerated a bi,  t but there is nothing severe enough to warrant surgery.  You need to avoid activites that aggravate your back pain (bending and lifting ,  Yardwork, etc)  I am prescribing an alternative to motrin :  meloxicam 1 tablet lasts all day long.  For back pain and arthritis.  OK to combine with tylenol but not with iburpofen or alleve.    continue  Your baby aspirin daily   Call the office to tell us which medication YOU ARE TAKING AT BEDTIME   If you want to try zoloft  For anxiety,  Take 25 mg daily at bedtime,  You may increase  To 50 mg after one week

## 2011-02-20 NOTE — Progress Notes (Signed)
Subjective:    Patient ID: [name], female    DOB: 1926/07/25, 78 y.o.   MRN: [id number]  HPI  Ms. Mogle is a 78 year old white female with a history of anxiety disorder and recurrent low back pain who presents today for follow up on results of MRI done of her lumbar spine. 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 She has had persistent low back pain at times radiates to her left leg and is concerned that she has developed spinal stenosis as her husband has had it. She is concerned about letting her go on too long without treatment since it has really significantly impacted his life. She has been taking ibuprofen once a day and Tylenol on as needed. She denies numbness and tingling in either leg and does think that maybe her anxiety is contributing to her symptoms. Her MRI showed multilevel mild degenerative changes and facet joint changes but nothing that was surgical. .  Past Medical History  Diagnosis Date  . Chronic orthostatic hypotension June 2012    with pprior syncopal event,  did not tolelrate florinef trial  . Anxiety   . Hyperlipidemia    Current Outpatient Prescriptions on File Prior to Visit  Medication Sig Dispense Refill  . aspirin 81 MG tablet Take 81 mg by mouth daily.        . chlordiazePOXIDE (LIBRIUM) 10 MG capsule Take 1 capsule (10 mg total) by mouth daily as needed for anxiety.  30 capsule  5  . Ginkgo Biloba 120 MG CAPS Take 120 mg by mouth daily.        . hydrOXYzine (ATARAX) 25 MG tablet Take 25 mg by mouth every 6 (six) hours as needed.        . Iron-Vitamins (GERITOL PO) Take by mouth.        . Lutein 20 MG CAPS Take 20 mg by mouth daily.        . magnesium oxide (MAG-OX) 400 MG tablet Take 400 mg by mouth daily.        [name] POTASSIUM GLUCONATE PO Take by mouth.        . pravastatin (PRAVACHOL) 20 MG tablet Take 1 tablet (20 mg total) by  mouth daily.  90 tablet  2  . vitamin B-12 (CYANOCOBALAMIN) 1000 MCG tablet Take 1,000 mcg by mouth daily.        . vitamin E 400 UNIT capsule Take 400 Units by mouth daily.          Review of Systems  Constitutional: Positive for fatigue. Negative for fever, chills and unexpected weight change.  HENT: Negative for hearing loss, ear pain, nosebleeds, congestion, sore throat, facial swelling, rhinorrhea, sneezing, mouth sores, trouble swallowing, neck pain, neck stiffness, voice change, postnasal drip, sinus pressure, tinnitus and ear discharge.   Eyes: Negative for pain, discharge, redness and visual disturbance.  Respiratory: Negative for cough, chest tightness, shortness of breath, wheezing and stridor.   Cardiovascular: Negative for chest pain, palpitations and leg swelling.  Musculoskeletal: Positive for back pain and arthralgias. Negative for myalgias.  Skin: Negative for color change and rash.  Neurological: Negative for dizziness, weakness, light-headedness and headaches.  Hematological: Negative for adenopathy.  Psychiatric/Behavioral: Positive for sleep disturbance.       Objective:   Physical Exam  Constitutional: She is oriented to person, place, and time. She appears well-developed and well-nourished.  HENT:  Mouth/Throat: Oropharynx is clear and moist.  Eyes: EOM are normal. Pupils are equal, round, and reactive to light. No scleral icterus.  Neck: Normal range of motion. Neck supple. No JVD present. No thyromegaly present.  Cardiovascular: Normal rate, regular rhythm, normal heart sounds and intact distal pulses.   Pulmonary/Chest: Effort normal and breath sounds normal.  Abdominal: Soft. Bowel sounds are normal. She exhibits no mass. There is no tenderness.  Musculoskeletal: Normal range of motion. She exhibits no edema.  Lymphadenopathy:    She has no cervical adenopathy.  Neurological: She is alert and oriented to person, place, and time.  Skin: Skin is warm and dry.   Psychiatric: She has a normal mood and affect.          Assessment & Plan:   Back pain, sacroiliac Secondary to mild degenerative changes seen on MRI with no surgical issues or nerve root compromise. I recommended that she limit her activities in the yard to those were appropriate for a woman of her 78 years of age. I specifically requested that she stop chopping wood and lifting heavy bundles of wood.  Insomnia Clinical data chronic anxiety. She uses Tranxene and Librium as needed. I have cautioned her that both of these are considered benzodiazepines as there and therefore are habit-forming I have recommended that she consider a trial of an SSRI to the right her from daily use of these medications. She is interested in trying Zoloft and has a prescription that her husband was not able to use that is 50 mg daily. She will begin using this and return in one month. I have asked her start with half of a tablet daily for the first week to avoid any side effects of nausea that are commonly encountered in the elderly when they started the medication.    Updated Medication List Outpatient Encounter Prescriptions as of 02/20/2011  Medication Sig Dispense Refill  . aspirin 81 MG tablet Take 81 mg by mouth daily.        . chlordiazePOXIDE (LIBRIUM) 10 MG capsule Take 1 capsule (10 mg total) by mouth daily as needed for anxiety.  30 capsule  5  . clorazepate (TRANXENE) 7.5 MG tablet Take 7.5 mg by mouth 2 (two) times daily as needed.      . Ginkgo Biloba 120 MG CAPS Take 120 mg by mouth daily.        . hydrOXYzine (ATARAX) 25 MG tablet Take 25 mg by mouth every 6 (six) hours as needed.        . Iron-Vitamins (GERITOL PO) Take by mouth.        . Lutein 20 MG CAPS Take 20 mg by mouth daily.        . magnesium oxide (MAG-OX) 400 MG tablet Take 400 mg by mouth daily.        [name] POTASSIUM GLUCONATE PO Take by mouth.        . pravastatin (PRAVACHOL) 20 MG tablet Take 1 tablet (20 mg total) by mouth daily.  90 tablet   2  . vitamin B-12 (CYANOCOBALAMIN) 1000 MCG tablet Take 1,000 mcg by mouth daily.        . vitamin E 400 UNIT capsule Take 400 Units by mouth daily.        . meloxicam (MOBIC) 15 MG tablet Take 1 tablet (15 mg total) by mouth daily.  30 tablet  3

## 2011-02-22 NOTE — Assessment & Plan Note (Signed)
Secondary to mild degenerative changes seen on MRI with no surgical issues or nerve root compromise. I recommended that she limit her activities in the yard to those were appropriate for a woman of her age. I specifically requested that she stop chopping wood and lifting heavy bundles of wood.

## 2011-02-22 NOTE — Assessment & Plan Note (Signed)
Clinical data chronic anxiety. She uses Tranxene and Librium as needed. I have cautioned her that both of these are considered benzodiazepines as there and therefore are habit-forming I have recommended that she consider a trial of an SSRI to the right her from daily use of these medications. She is interested in trying Zoloft and has a prescription that her husband was not able to use that is 50 mg daily. She will begin using this and return in one month. I have asked her start with half of a tablet daily for the first week to avoid any side effects of nausea that are commonly encountered in the elderly when they started the medication.

## 2011-03-09 ENCOUNTER — Telehealth: Payer: Self-pay | Admitting: Internal Medicine

## 2011-03-09 NOTE — Telephone Encounter (Signed)
[phone] PT CALLED SHE IS CONCERNED THAT SHE HAS BEEN GETTING UP 6 TIMES AT NIGHT TO GO TO THE RESTROOM  THIS AS BEEN GOING ON SINCE Friday.  THE LAST TIME THIS HAPPENED SHE WAS DEHYDRATED

## 2011-03-10 NOTE — Telephone Encounter (Signed)
Need more info:  Is it a full bladder she is emptying 6 times a night or just a little amount of urine each time?

## 2011-03-10 NOTE — Telephone Encounter (Signed)
I talked to patient she stated this frequency started Friday night.  She said the frequency is only at night she is normal during the day.  She does not have any burning when she does urinate.  She reports she stops drinking any fluids long before she goes to bed.  Please advise.

## 2011-03-11 NOTE — Telephone Encounter (Signed)
Patient is coming in tomorrow. 

## 2011-03-11 NOTE — Telephone Encounter (Signed)
Patient stated is a pretty good amount every time she urinates, not just a small amount.  She stated she got up 3 times last night.  She wanted to know if you wanted her to make an appt.  Please advise.

## 2011-03-11 NOTE — Telephone Encounter (Signed)
Yes, to provide a urine specimedn for UA and culture.

## 2011-03-12 ENCOUNTER — Telehealth: Payer: Self-pay | Admitting: *Deleted

## 2011-03-12 ENCOUNTER — Encounter: Payer: Self-pay | Admitting: Internal Medicine

## 2011-03-12 ENCOUNTER — Other Ambulatory Visit (INDEPENDENT_AMBULATORY_CARE_PROVIDER_SITE_OTHER): Payer: Medicare Other | Admitting: *Deleted

## 2011-03-12 DIAGNOSIS — R350 Frequency of micturition: Secondary | ICD-10-CM

## 2011-03-12 LAB — POCT URINALYSIS DIPSTICK
Bilirubin, UA: NEGATIVE
Blood, UA: NEGATIVE
Glucose, UA: NEGATIVE
Ketones, UA: NEGATIVE
Leukocytes, UA: NEGATIVE
Nitrite, UA: NEGATIVE
Protein, UA: NEGATIVE
Spec Grav, UA: 1.015
Urobilinogen, UA: 0.2
pH, UA: 7

## 2011-03-12 NOTE — Telephone Encounter (Signed)
Patient has been having urine frequency during the night. She brought in a urine sample this morning which was completley negative. (Results have been entered). Do you want me to send for culture? Please advise.

## 2011-03-12 NOTE — Telephone Encounter (Signed)
Patient notified.  Lab appt scheduled.

## 2011-03-12 NOTE — Telephone Encounter (Signed)
No, not necessary.  Have her try taking her husband's furosemide 20 mg one tablet daily in the morning for one week to see if this gets rid of the nighttime diuresis.  Return for BMET in one week V58.69

## 2011-03-13 ENCOUNTER — Encounter: Payer: Self-pay | Admitting: Internal Medicine

## 2011-03-18 ENCOUNTER — Other Ambulatory Visit (INDEPENDENT_AMBULATORY_CARE_PROVIDER_SITE_OTHER): Payer: Medicare Other | Admitting: *Deleted

## 2011-03-18 ENCOUNTER — Other Ambulatory Visit: Payer: Self-pay | Admitting: Internal Medicine

## 2011-03-18 DIAGNOSIS — Z79899 Other long term (current) drug therapy: Secondary | ICD-10-CM

## 2011-03-18 LAB — BASIC METABOLIC PANEL
BUN: 17 mg/dL (ref 6–23)
CO2: 30 mEq/L (ref 19–32)
Calcium: 9.5 mg/dL (ref 8.4–10.5)
Chloride: 99 mEq/L (ref 96–112)
Creatinine, Ser: 1 mg/dL (ref 0.4–1.2)
GFR: 55.74 mL/min — ABNORMAL LOW (ref >60.00)
Glucose, Bld: 86 mg/dL (ref 70–99)
Potassium: 4.3 mEq/L (ref 3.5–5.1)
Sodium: 137 mEq/L (ref 135–145)

## 2011-03-18 NOTE — Telephone Encounter (Signed)
No, she told me today that that medicationa was for him.  she was going to try her other medication for sleep, the clorazepate 7.5 mg that she already takes once daily.    She should not take both.

## 2011-03-18 NOTE — Telephone Encounter (Signed)
Patient called and stated you had told her she could use [name] to see if it worked for her.  She has been using it and it does work, you gave her an Rx this morning for a refill for [name] and she took it to the drug store and they would not fill it because it was early.  She wanted to know if you would approve her own Rx for [name] so she didn't have to use [name].  Please advise.

## 2011-03-19 ENCOUNTER — Ambulatory Visit: Payer: Medicare Other | Admitting: Internal Medicine

## 2011-03-19 NOTE — Telephone Encounter (Signed)
Patient stated she likes the [place] better because it doesn't dry her mouth out, and she was not taking the clorazepate and the ambien at the same time.  She wanted to know if you could approve ambien for her and she would not take anymore of the clorazepate.  Please advise.

## 2011-03-22 MED ORDER — ZOLPIDEM TARTRATE 5 MG PO TABS: 5.0000 mg | ORAL_TABLET | Freq: Every evening | ORAL | Status: DC | PRN

## 2011-03-22 NOTE — Telephone Encounter (Signed)
Yes, you can call in the zolpidem for her and discontinue the chlorazepate.  Dose is 5 mg    #30 with 2 refills.

## 2011-04-02 ENCOUNTER — Ambulatory Visit: Payer: Medicare Other | Admitting: Internal Medicine

## 2011-04-27 ENCOUNTER — Telehealth: Payer: Self-pay | Admitting: Internal Medicine

## 2011-04-27 DIAGNOSIS — Z79899 Other long term (current) drug therapy: Secondary | ICD-10-CM

## 2011-04-27 DIAGNOSIS — E785 Hyperlipidemia, unspecified: Secondary | ICD-10-CM

## 2011-04-27 NOTE — Telephone Encounter (Signed)
Yes I do,  Order in [facility]

## 2011-04-27 NOTE — Telephone Encounter (Signed)
Patient notified

## 2011-04-27 NOTE — Telephone Encounter (Signed)
I tried calling patient but it just kept ringing, I will try again later.

## 2011-04-27 NOTE — Telephone Encounter (Signed)
Patient called and stated she has an appt with you tomorrow and wanted to know if you wanted her to come fasting to check her cholesterol.  Please advise.

## 2011-04-27 NOTE — Telephone Encounter (Signed)
Tried calling patient again and was told that she is in the restroom and to call back in 5 min. Will try again before leaving .

## 2011-04-28 ENCOUNTER — Ambulatory Visit (INDEPENDENT_AMBULATORY_CARE_PROVIDER_SITE_OTHER): Payer: Medicare Other | Admitting: Internal Medicine

## 2011-04-28 ENCOUNTER — Encounter: Payer: Self-pay | Admitting: Internal Medicine

## 2011-04-28 ENCOUNTER — Telehealth: Payer: Self-pay | Admitting: Internal Medicine

## 2011-04-28 VITALS — BP 126/72 | HR 79 | Temp 98.2°F | Resp 16 | Wt 123.0 lb

## 2011-04-28 DIAGNOSIS — R259 Unspecified abnormal involuntary movements: Secondary | ICD-10-CM

## 2011-04-28 DIAGNOSIS — E785 Hyperlipidemia, unspecified: Secondary | ICD-10-CM

## 2011-04-28 DIAGNOSIS — F411 Generalized anxiety disorder: Secondary | ICD-10-CM

## 2011-04-28 DIAGNOSIS — Z79899 Other long term (current) drug therapy: Secondary | ICD-10-CM

## 2011-04-28 DIAGNOSIS — F419 Anxiety disorder, unspecified: Secondary | ICD-10-CM

## 2011-04-28 DIAGNOSIS — R030 Elevated blood-pressure reading, without diagnosis of hypertension: Secondary | ICD-10-CM

## 2011-04-28 DIAGNOSIS — G252 Other specified forms of tremor: Secondary | ICD-10-CM

## 2011-04-28 LAB — COMPREHENSIVE METABOLIC PANEL
ALT: 15 U/L (ref 0–35)
AST: 30 U/L (ref 0–37)
Albumin: 4.6 g/dL (ref 3.5–5.2)
Alkaline Phosphatase: 46 U/L (ref 39–117)
BUN: 14 mg/dL (ref 6–23)
CO2: 27 mEq/L (ref 19–32)
Calcium: 9.6 mg/dL (ref 8.4–10.5)
Chloride: 104 mEq/L (ref 96–112)
Creatinine, Ser: 1 mg/dL (ref 0.4–1.2)
GFR: 59.08 mL/min — ABNORMAL LOW (ref >60.00)
Glucose, Bld: 91 mg/dL (ref 70–99)
Potassium: 4.5 mEq/L (ref 3.5–5.1)
Sodium: 140 mEq/L (ref 135–145)
Total Bilirubin: 0.9 mg/dL (ref 0.3–1.2)
Total Protein: 7.2 g/dL (ref 6.0–8.3)

## 2011-04-28 LAB — LIPID PANEL
Cholesterol: 199 mg/dL (ref 0–200)
HDL: 59.3 mg/dL (ref >39.00)
LDL Cholesterol: 117 mg/dL — ABNORMAL HIGH (ref 0–99)
Total CHOL/HDL Ratio: 3
Triglycerides: 115 mg/dL (ref 0.0–149.0)
VLDL: 23 mg/dL (ref 0.0–40.0)

## 2011-04-28 MED ORDER — DIAZEPAM 10 MG PO TABS: 10.0000 mg | ORAL_TABLET | Freq: Two times a day (BID) | ORAL | Status: AC | PRN

## 2011-04-28 MED ORDER — FUROSEMIDE 20 MG PO TABS: 20.0000 mg | ORAL_TABLET | Freq: Every day | ORAL | Status: DC

## 2011-04-28 NOTE — Telephone Encounter (Signed)
Pt came back in stating she needs to get rx for furosemide [facility] [phone] Please advise when this is called in

## 2011-04-28 NOTE — Patient Instructions (Signed)
I would like you to try valium twice daily for a week instead of librium to see if it helps your back.    Start with 1/2 tablet, repeat in one hour if no effect.    Call me in a week to determine if you would llike to resume librium twice dialy  Do not increase your tranxene or ambien at night until we sow how the increased anxiety medication affects you.

## 2011-04-28 NOTE — Telephone Encounter (Signed)
Patient notified, Rx called in. 

## 2011-04-29 NOTE — Assessment & Plan Note (Signed)
improved with use of Toprol. No changes today

## 2011-04-29 NOTE — Assessment & Plan Note (Signed)
Dicussed current use of librium and request for escalation of dose.  Recommended trial of valium bid instead given concurrent muscle spasm and back pain.  One week rx given of 10 mg dose.  Will resume librium if she prefers , after one week trial.  No change in nighttime meds for insomnia at this time.  contineu tranxene. 7.5 mg

## 2011-04-29 NOTE — Progress Notes (Signed)
Patient ID: [name], female   DOB: [birth year], 78 y.o.   MRN: [id number]   Patient Active Problem List  Diagnoses  . Elevated blood pressure reading without diagnosis of hypertension  . Chronic orthostatic hypotension  . Anxiety  . Tremor, coarse  . Other and unspecified hyperlipidemia  . Back pain, sacroiliac  . Near syncope  . Hyperlipidemia  . Insomnia    Subjective:  CC:   Chief Complaint  Patient presents with  . Medication Problem    discuss medications    HPI:   Gwendolyn Hubbardis a 78 y.o. female who presents  with worsening anxiety causing increased somatic complaints of back pain,  insomnia despite use of tranxene at bedtime.  Her husband's declining health is a major contributor to her worries,  He is irritable and verbally abusive to her when he is in pain. She has been taking librium once daily and is requesting an increase to twice daily. 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 Appetite good, no nausea, dizziness,  Abdominal pain. Bowels moving regularly.   Past Medical History  Diagnosis Date  . Chronic orthostatic hypotension June 2012    with pprior syncopal event,  did not tolelrate florinef trial  . Anxiety   . Hyperlipidemia     No past surgical history on file.       The following portions of the patient's history were reviewed and updated as appropriate: Allergies, current medications, and problem list.    Review of Systems:   12 Pt  review of systems was negative except those addressed in the HPI,     History   Social History  . Marital Status: Married    Spouse Name: N/A    Number of Children: N/A  . Years of Education: N/A   Occupational History  . Not on file.   Social History Main Topics  . Smoking status: Never Smoker   . Smokeless tobacco: Never Used  . Alcohol Use: No  . Drug Use: No  . Sexually Active: Not on file    Other Topics Concern  . Not on file   Social History Narrative  . No narrative on file    Objective:  BP 126/72  Pulse 79  Temp(Src) 98.2 F (36.8 C) (Oral)  Resp 16  Wt 123 lb (55.792 kg)  SpO2 100%  General appearance: alert, cooperative and appears stated age Ears: normal TM's and external ear canals both ears Throat: lips, mucosa, and tongue normal; teeth and gums normal Neck: no adenopathy, no carotid bruit, supple, symmetrical, trachea midline and thyroid not enlarged, symmetric, no tenderness/mass/nodules Back: symmetric, no curvature. ROM normal. No CVA tenderness. Lungs: clear to auscultation bilaterally Heart: regular rate and rhythm, S1, S2 normal, no murmur, click, rub or gallop Abdomen: soft, non-tender; bowel sounds normal; no masses,  no organomegaly Pulses: 2+ and symmetric Skin: Skin color, texture, turgor normal. No rashes or lesions Lymph nodes: Cervical, supraclavicular, and axillary nodes normal.  Assessment and Plan:  Tremor, coarse improved with use of Toprol. No changes today  Elevated blood pressure reading without diagnosis of hypertension Better since addition of toprol for tremor.   Anxiety Dicussed current use of librium and request for escalation of dose.  Recommended trial of valium bid instead given concurrent muscle spasm and back pain.  One week rx given of 10 mg dose.  Will resume librium  if she prefers , after one week trial.  No change in nighttime meds for insomnia at this time.  contineu tranxene. 7.5 mg   Hyperlipidemia imporved ,  LDL now 117 with pravastatin,  LFTS normal    Updated Medication List Outpatient Encounter Prescriptions as of 04/28/2011  Medication Sig Dispense Refill  . aspirin 81 MG tablet Take 81 mg by mouth daily.        . clorazepate (TRANXENE) 7.5 MG tablet Take 7.5 mg by mouth 2 (two) times daily as needed.      . Iron-Vitamins (GERITOL PO) Take by mouth.        . Lutein 20 MG CAPS Take 20 mg by mouth  daily.        . magnesium oxide (MAG-OX) 400 MG tablet Take 400 mg by mouth daily.        . meloxicam (MOBIC) 15 MG tablet Take 1 tablet (15 mg total) by mouth daily.  30 tablet  3  . POTASSIUM GLUCONATE PO Take by mouth.        . pravastatin (PRAVACHOL) 20 MG tablet Take 1 tablet (20 mg total) by mouth daily.  90 tablet  2  . vitamin B-12 (CYANOCOBALAMIN) 1000 MCG tablet Take 1,000 mcg by mouth daily.        . vitamin E 400 UNIT capsule Take 400 Units by mouth daily.        [name] zolpidem (AMBIEN) 5 MG tablet Take 1 tablet (5 mg total) by mouth at bedtime as needed for sleep.  30 tablet  3  . DISCONTD: chlordiazePOXIDE (LIBRIUM) 10 MG capsule Take 10 mg by mouth daily.      . diazepam (VALIUM) 10 MG tablet Take 1 tablet (10 mg total) by mouth every 12 (twelve) hours as needed for anxiety (for one week ).  14 tablet  0  . DISCONTD: furosemide (LASIX) 20 MG tablet Take 1 tablet (20 mg total) by mouth daily.  30 tablet  3  . DISCONTD: Ginkgo Biloba 120 MG CAPS Take 120 mg by mouth daily.        [name] DISCONTD: hydrOXYzine (ATARAX) 25 MG tablet Take 25 mg by mouth every 6 (six) hours as needed.           Orders Placed This Encounter  Procedures  . Comp Met (CMET)    No Follow-up on file.

## 2011-04-29 NOTE — Assessment & Plan Note (Signed)
imporved ,  LDL now 117 with pravastatin,  LFTS normal

## 2011-04-29 NOTE — Assessment & Plan Note (Signed)
Better since addition of toprol for tremor.

## 2011-05-01 ENCOUNTER — Telehealth: Payer: Self-pay | Admitting: *Deleted

## 2011-05-01 NOTE — Telephone Encounter (Signed)
Spoke to pt husband and he mentioned pt will return call to set up appointment.

## 2011-05-14 ENCOUNTER — Telehealth: Payer: Self-pay | Admitting: Internal Medicine

## 2011-05-14 NOTE — Telephone Encounter (Signed)
Pt called she spoke with lettie @[facility]  about a back brace.  She wanted to let you know this.   Pt stated they keep calling to she told them she wanted back brace.  She decide to try this since they have been calling all the time Call back # [phone]

## 2011-05-15 NOTE — Telephone Encounter (Signed)
Patient just wanted to let us know she did get the back brace.

## 2011-05-18 ENCOUNTER — Ambulatory Visit: Payer: Medicare Other | Admitting: Cardiovascular Disease

## 2011-05-25 ENCOUNTER — Other Ambulatory Visit: Payer: Self-pay | Admitting: Internal Medicine

## 2011-05-25 DIAGNOSIS — F419 Anxiety disorder, unspecified: Secondary | ICD-10-CM

## 2011-05-25 MED ORDER — CHLORDIAZEPOXIDE HCL 10 MG PO CAPS: 10.0000 mg | ORAL_CAPSULE | Freq: Two times a day (BID) | ORAL | Status: DC | PRN

## 2011-05-25 NOTE — Telephone Encounter (Signed)
rx for librium reinstated.  Please call to pharmacy. And tell pharmacy to dc any valium rx that mey be on file.

## 2011-05-25 NOTE — Telephone Encounter (Signed)
Medication phoned to [facility] pharmacy as instructed. Also d/c Valium rx.Patient notified as instructed by telephone.

## 2011-05-25 NOTE — Telephone Encounter (Signed)
Please call in rx for librium in chart and dc any rx they have on file for valium

## 2011-05-25 NOTE — Telephone Encounter (Signed)
Patient wanting to switch back to her librium at twice a day the valium is making her feel more depressed.

## 2011-06-10 ENCOUNTER — Encounter: Payer: Self-pay | Admitting: Cardiovascular Disease

## 2011-06-10 ENCOUNTER — Ambulatory Visit (INDEPENDENT_AMBULATORY_CARE_PROVIDER_SITE_OTHER): Payer: Medicare Other | Admitting: Cardiovascular Disease

## 2011-06-10 VITALS — BP 140/62 | HR 72 | Ht 68.0 in | Wt 122.0 lb

## 2011-06-10 DIAGNOSIS — E785 Hyperlipidemia, unspecified: Secondary | ICD-10-CM

## 2011-06-10 DIAGNOSIS — M533 Sacrococcygeal disorders, not elsewhere classified: Secondary | ICD-10-CM

## 2011-06-10 DIAGNOSIS — R55 Syncope and collapse: Secondary | ICD-10-CM

## 2011-06-10 DIAGNOSIS — R030 Elevated blood-pressure reading, without diagnosis of hypertension: Secondary | ICD-10-CM

## 2011-06-10 NOTE — Progress Notes (Addendum)
Patient ID: [name], female    DOB: 04-Apr-1926, 78 y.o.   MRN: [id number]  HPI Comments: [name] is a please 78 yo woman, patient of Dr. Darrick Huntsman, with a hx of hyperlipidemia, who is very active at baseline, with a hx of near syncope after standing up from her breakfast table (held secondary to dehydration), who presents for routine followup  She continues to be very active. She reports that her weight is stable. Her legs are somewhat weak when she gets up and she does not walk long distances.   She denies chest pain , cough, orthopnea, edema, and shortness of breath.  No weight loss, fevers or night sweats. She takes care of her husband who has underlying medical issues.  EKG shows NSR ,  rate 72 beats per minute with no significant ST or T wave changes    Outpatient Encounter Prescriptions as of 06/10/2011  Medication Sig Dispense Refill  . aspirin 81 MG tablet Take 81 mg by mouth daily.        . chlordiazePOXIDE (LIBRIUM) 10 MG capsule Take 10 mg by mouth daily.      . clorazepate (TRANXENE) 7.5 MG tablet Take 7.5 mg by mouth daily.       . furosemide (LASIX) 20 MG tablet Take 1 tablet (20 mg total) by mouth daily.  30 tablet  3  . Iron-Vitamins (GERITOL PO) Take by mouth.        . Lutein 20 MG CAPS Take 20 mg by mouth daily.        . magnesium oxide (MAG-OX) 400 MG tablet Take 400 mg by mouth daily.        [name] POTASSIUM GLUCONATE PO Take by mouth.        . pravastatin (PRAVACHOL) 20 MG tablet Take 1 tablet (20 mg total) by mouth daily.  90 tablet  2  . vitamin B-12 (CYANOCOBALAMIN) 1000 MCG tablet Take 1,000 mcg by mouth daily.        . vitamin E 400 UNIT capsule Take 400 Units by mouth daily.        [name] zolpidem (AMBIEN) 5 MG tablet Take 1 tablet (5 mg total) by mouth at bedtime as needed for sleep.  30 tablet  3   Review of Systems  Constitutional: Negative.   HENT: Negative.   Eyes: Negative.   Respiratory: Negative.   Cardiovascular: Negative.   Gastrointestinal: Negative.     Musculoskeletal: Negative.   Skin: Negative.   Neurological: Negative.        Near syncope  Hematological: Negative.   Psychiatric/Behavioral: Negative.   All other systems reviewed and are negative.    BP 140/62  Pulse 72  Ht 5\' 8"  (1.727 m)  Wt 122 lb (55.339 kg)  BMI 18.55 kg/m2  Physical Exam  Nursing note and vitals reviewed. Constitutional: She is oriented to person, place, and time. She appears well-developed and well-nourished.  HENT:  Head: Normocephalic.  Nose: Nose normal.  Mouth/Throat: Oropharynx is clear and moist.  Eyes: Conjunctivae are normal. Pupils are equal, round, and reactive to light.  Neck: Normal range of motion. Neck supple. No JVD present.  Cardiovascular: Normal rate, regular rhythm, S1 normal, S2 normal, normal heart sounds and intact distal pulses.  Exam reveals no gallop and no friction rub.   No murmur heard. Pulmonary/Chest: Effort normal and breath sounds normal. No respiratory distress. She has no wheezes. She has no rales. She exhibits no tenderness.  Abdominal: Soft. Bowel sounds are  normal. She exhibits no distension. There is no tenderness.  Musculoskeletal: Normal range of motion. She exhibits edema. She exhibits no tenderness.  Lymphadenopathy:    She has no cervical adenopathy.  Neurological: She is alert and oriented to person, place, and time. Coordination normal.  Skin: Skin is warm and dry. No rash noted. No erythema.  Psychiatric: She has a normal mood and affect. Her behavior is normal. Judgment and thought content normal.         Assessment and Plan

## 2011-06-10 NOTE — Assessment & Plan Note (Signed)
Left low back pain with rare sciatica seems to be her major issue

## 2011-06-10 NOTE — Assessment & Plan Note (Signed)
No further episodes of syncope or near syncope. She attributes her previous episode 2 dehydration. No further workup at this time. We have asked her to contact us if she has any similar symptoms.

## 2011-06-10 NOTE — Patient Instructions (Addendum)
You are doing well. No medication changes were made.  Please call us if you have new issues that need to be addressed before your next appt.  Your physician wants you to follow-up in: 12 months.  You will receive a reminder letter in the mail two months in advance. If you don't receive a letter, please call our office to schedule the follow-up appointment. 

## 2011-06-10 NOTE — Assessment & Plan Note (Signed)
Cholesterol has significantly improved on pravastatin. She attributes her previous elevated numbers to eating poorly.

## 2011-07-28 ENCOUNTER — Other Ambulatory Visit: Payer: Self-pay | Admitting: Internal Medicine

## 2011-07-29 MED ORDER — CLORAZEPATE DIPOTASSIUM 7.5 MG PO TABS: ORAL_TABLET | ORAL | Status: DC

## 2011-07-30 ENCOUNTER — Other Ambulatory Visit: Payer: Self-pay | Admitting: *Deleted

## 2011-07-30 MED ORDER — PRAVASTATIN SODIUM 20 MG PO TABS: 20.0000 mg | ORAL_TABLET | Freq: Every day | ORAL | Status: DC

## 2011-09-01 ENCOUNTER — Ambulatory Visit (INDEPENDENT_AMBULATORY_CARE_PROVIDER_SITE_OTHER): Payer: Medicare Other | Admitting: Internal Medicine

## 2011-09-01 ENCOUNTER — Encounter: Payer: Self-pay | Admitting: Internal Medicine

## 2011-09-01 VITALS — BP 136/68 | HR 84 | Temp 98.5°F | Resp 16 | Wt 120.8 lb

## 2011-09-01 DIAGNOSIS — M5442 Lumbago with sciatica, left side: Secondary | ICD-10-CM

## 2011-09-01 DIAGNOSIS — G4700 Insomnia, unspecified: Secondary | ICD-10-CM

## 2011-09-01 DIAGNOSIS — M5137 Other intervertebral disc degeneration, lumbosacral region: Secondary | ICD-10-CM | POA: Insufficient documentation

## 2011-09-01 DIAGNOSIS — M5430 Sciatica, unspecified side: Secondary | ICD-10-CM

## 2011-09-01 NOTE — Progress Notes (Signed)
Patient ID: [name], female   DOB: 08/07/1926, 78 y.o.   MRN: [id number]  Patient Active Problem List  Diagnosis  . Elevated blood pressure reading without diagnosis of hypertension  . Chronic orthostatic hypotension  . Anxiety  . Tremor, coarse  . Other and unspecified hyperlipidemia  . Back pain, sacroiliac  . Hyperlipidemia  . Insomnia  . Lumbago with sciatica of left side    Subjective:  CC:   Chief Complaint  Patient presents with  . Back Pain    HPI:   [name] a 78 y.o. female who presents with  Acute on chronic right sided back pain.  Her pain is focal and located at the [facility] joint.  She reports it is more intense than her usual pain . Aggravated by stooping to make bed,  Cannot bend over without having severe pain and continues to have pain even when walking .  No longer relieved  By her usual chiropractic therapy by Dr. Meredeth Ide. The pain Radiates down left leg to foot.  Wants to have a few new therapies by Dr. Meredeth Ide for DDD by prior  lumbar films .  2) followup on insomnia. Since her last visit she has stopped using the lorazepam completely. She is using only tranxene and Vistaril for management of insomnia and anxiety.   Past Medical History  Diagnosis Date  . Chronic orthostatic hypotension June 2012    with pprior syncopal event,  did not tolelrate florinef trial  . Anxiety   . Hyperlipidemia     History reviewed. No pertinent past surgical history.       The following portions of the patient's history were reviewed and updated as appropriate: Allergies, current medications, and problem list.    Review of Systems:   A comprehensive ROS was done and positive for insomnia, anxiety and back pain     The rest was negative.    History   Social History  . Marital Status: Married    Spouse Name: N/A    Number of Children: N/A  . Years of Education: N/A   Occupational History  . Not on file.   Social History Main Topics  . Smoking  status: Never Smoker   . Smokeless tobacco: Never Used  . Alcohol Use: No  . Drug Use: No  . Sexually Active: Not on file   Other Topics Concern  . Not on file   Social History Narrative  . No narrative on file    Objective:  BP 136/68  Pulse 84  Temp 98.5 F (36.9 C) (Oral)  Resp 16  Wt 120 lb 12 oz (54.772 kg)  SpO2 96%  General appearance: alert, cooperative and appears stated age Ears: normal TM's and external ear canals both ears Throat: lips, mucosa, and tongue normal; teeth and gums normal Neck: no adenopathy, no carotid bruit, supple, symmetrical, trachea midline and thyroid not enlarged, symmetric, no tenderness/mass/nodules Back: symmetric, no curvature. ROM normal. No CVA tenderness. Lungs: clear to auscultation bilaterally Heart: regular rate and rhythm, S1, S2 normal, no murmur, click, rub or gallop Abdomen: soft, non-tender; bowel sounds normal; no masses,  no organomegaly Pulses: 2+ and symmetric Skin: Skin color, texture, turgor normal. No rashes or lesions MSK: Normal hip range of motion. Strength in lower extremities 5 out of 5. Can toe walk and heel walk. No muscle wasting. Neuro: DTRs are 2+ bilaterally in Achilles and patellar regions Lymph nodes: Cervical, supraclavicular, and axillary nodes normal.  Assessment and Plan:  Lumbago  with sciatica of left side Her exam is completely normal regarding strength range of motion and reflexes. Continue Motrin. She is welcome to try whatever conservative therapies Dr. Meredeth Ide would like to try. If they do not work I have offered to refer her for MRI since she is having radicular pain.  Insomnia Now managed with Tranxene and Vistaril.   Updated Medication List Outpatient Encounter Prescriptions as of 09/01/2011  Medication Sig Dispense Refill  . aspirin 81 MG tablet Take 81 mg by mouth daily.        . clorazepate (TRANXENE) 7.5 MG tablet Take one and a half tablet daily at bedtime as needed for anxiety.  90  tablet  1  . furosemide (LASIX) 20 MG tablet Take 1 tablet (20 mg total) by mouth daily.  30 tablet  3  . hydrOXYzine (VISTARIL) 25 MG capsule Take 25 mg by mouth 3 (three) times daily as needed.      . Iron-Vitamins (GERITOL PO) Take by mouth.        . Lutein 20 MG CAPS Take 20 mg by mouth daily.        . magnesium oxide (MAG-OX) 400 MG tablet Take 400 mg by mouth daily.        [name] POTASSIUM GLUCONATE PO Take by mouth.        . pravastatin (PRAVACHOL) 20 MG tablet Take 1 tablet (20 mg total) by mouth daily.  90 tablet  2  . vitamin B-12 (CYANOCOBALAMIN) 1000 MCG tablet Take 1,000 mcg by mouth daily.        . vitamin E 400 UNIT capsule Take 400 Units by mouth daily.        [name] DISCONTD: chlordiazePOXIDE (LIBRIUM) 10 MG capsule Take 10 mg by mouth daily.      [name] DISCONTD: zolpidem (AMBIEN) 5 MG tablet Take 1 tablet (5 mg total) by mouth at bedtime as needed for sleep.  30 tablet  3     Orders Placed This Encounter  Procedures  . [facility] MAMMOGRAPHY    No Follow-up on file.

## 2011-09-01 NOTE — Patient Instructions (Addendum)
You can take up to 3 motrin and 1 tylenol every 6 hours if needed for back pain  Ok to try Dr. Reita Cliche treatments for your back pain .  If the pain persistently radiates down leg,  We should get an MRI

## 2011-09-02 NOTE — Assessment & Plan Note (Signed)
Her exam is completely normal regarding strength range of motion and reflexes. Continue Motrin. She is welcome to try whatever conservative therapies Dr. Meredeth Ide would like to try. If they do not work I have offered to refer her for MRI since she is having radicular pain.

## 2011-09-02 NOTE — Assessment & Plan Note (Signed)
Now managed with Tranxene and Vistaril.

## 2011-10-26 ENCOUNTER — Encounter: Payer: Self-pay | Admitting: Internal Medicine

## 2011-10-26 ENCOUNTER — Ambulatory Visit (INDEPENDENT_AMBULATORY_CARE_PROVIDER_SITE_OTHER): Payer: Medicare Other | Admitting: Internal Medicine

## 2011-10-26 VITALS — BP 120/72 | HR 71 | Temp 97.8°F | Ht 66.0 in | Wt 123.2 lb

## 2011-10-26 DIAGNOSIS — E785 Hyperlipidemia, unspecified: Secondary | ICD-10-CM

## 2011-10-26 DIAGNOSIS — F419 Anxiety disorder, unspecified: Secondary | ICD-10-CM

## 2011-10-26 DIAGNOSIS — Z1239 Encounter for other screening for malignant neoplasm of breast: Secondary | ICD-10-CM

## 2011-10-26 DIAGNOSIS — Z1211 Encounter for screening for malignant neoplasm of colon: Secondary | ICD-10-CM

## 2011-10-26 DIAGNOSIS — Z79899 Other long term (current) drug therapy: Secondary | ICD-10-CM

## 2011-10-26 DIAGNOSIS — Z0000 Encounter for general adult medical examination without abnormal findings: Secondary | ICD-10-CM

## 2011-10-26 LAB — LIPID PANEL
Cholesterol: 189 mg/dL (ref 0–200)
HDL: 56.5 mg/dL (ref >39.00)
LDL Cholesterol: 115 mg/dL — ABNORMAL HIGH (ref 0–99)
Total CHOL/HDL Ratio: 3
Triglycerides: 88 mg/dL (ref 0.0–149.0)
VLDL: 17.6 mg/dL (ref 0.0–40.0)

## 2011-10-26 LAB — LDL CHOLESTEROL, DIRECT: Direct LDL: 105.7 mg/dL

## 2011-10-26 MED ORDER — CLORAZEPATE DIPOTASSIUM 7.5 MG PO TABS: 7.5000 mg | ORAL_TABLET | Freq: Three times a day (TID) | ORAL | Status: DC | PRN

## 2011-10-26 MED ORDER — HYDROXYZINE PAMOATE 25 MG PO CAPS: 25.0000 mg | ORAL_CAPSULE | Freq: Three times a day (TID) | ORAL | Status: DC | PRN

## 2011-10-26 NOTE — Progress Notes (Signed)
Patient ID: [name], female   DOB: [birth year], 78 y.o.   MRN: [id number] The patient is here for annual Medicare wellness examination and management of other chronic and acute problems. Her previous episode of right-sided back pain in the setting of degenerative disc disease has resolved. She did have an episode recently brought on by carrying heavy typewriter up a flight of stairs. She continues to do yard work outside her to lift anything very heavy.   The risk factors are reflected in the social history.  The roster of all physicians providing medical care to patient - is listed in the Snapshot section of the chart.  Activities of daily living:  The patient is 100% independent in all ADLs: dressing, toileting, feeding as well as independent mobility  Home safety : The patient has smoke detectors in the home. They wear seatbelts.  There are no firearms at home. There is no violence in the home.   There is no risks for hepatitis, STDs or HIV. There is no   history of blood transfusion. They have no travel history to infectious disease endemic areas of the world.  The patient has seen their dentist in the last six month. They have seen their eye doctor in the last year. They admit to no hearing difficulty with regard to whispered voices or television programs.  They have deferred audiologic testing in the last year.  They do not  have excessive sun exposure. She observes the need for sun protection with use of: hats, long sleeves and use of sunscreen if there is significant sun exposure.   Diet: the importance of a healthy diet is discussed. She does  have a healthy diet.  The benefits of regular aerobic exercise were discussed. She walks 4 times per week ,  20 minutes.   Depression screen: there are no signs or vegative symptoms of depression- irritability, change in appetite, anhedonia, sadness/tearfullness.  Cognitive assessment: the patient manages all their financial and personal  affairs and is actively engaged. They could relate day,date,year and events; recalled 2/3 objects at 3 minutes; performed clock-face test normally.  The following portions of the patient's history were reviewed and updated as appropriate: allergies, current medications, past family history, past medical history,  past surgical history, past social history  and problem list.  Visual acuity was not assessed per patient preference since she has regular follow up with her ophthalmologist. Hearing and body mass index were assessed and reviewed.   During the course of the visit the patient was educated and counseled about appropriate screening and preventive services including : fall prevention , diabetes screening, nutrition counseling, colorectal cancer screening, and recommended immunizations.    Objective:  .BP 120/72  Pulse 71  Temp 97.8 F (36.6 C) (Oral)  Ht 5\' 6"  (1.676 m)  Wt 123 lb 4 oz (55.906 kg)  BMI 19.89 kg/m2  SpO2 96%  General appearance: alert, cooperative and appears stated age Ears: normal TM's and external ear canals both ears Throat: lips, mucosa, and tongue normal; teeth and gums normal Neck: no adenopathy, no carotid bruit, supple, symmetrical, trachea midline and thyroid not enlarged, symmetric, no tenderness/mass/nodules Back: symmetric, no curvature. ROM normal. No CVA tenderness. Lungs: clear to auscultation bilaterally Heart: regular rate and rhythm, S1, S2 normal, no murmur, click, rub or gallop Abdomen: soft, non-tender; bowel sounds normal; no masses,  no organomegaly Pulses: 2+ and symmetric Skin: Skin color, texture, turgor normal. No rashes or lesions Lymph nodes: Cervical, supraclavicular, and axillary nodes  normal.  Assessment and Plan  Screening for breast cancer She has a history deferred due to  Screening for colon cancer She's not interested in continuing colon cancer screening given her age.  Anxiety Anxiety is managed with clorazepate and   hydroxyzine. Refills provided today.   Updated Medication List Outpatient Encounter Prescriptions as of 10/26/2011  Medication Sig Dispense Refill  . aspirin 81 MG tablet Take 81 mg by mouth daily.        . clorazepate (TRANXENE) 7.5 MG tablet Take 1 tablet (7.5 mg total) by mouth 3 (three) times daily as needed for anxiety.  90 tablet  1  . furosemide (LASIX) 20 MG tablet Take 1 tablet (20 mg total) by mouth daily.  30 tablet  3  . hydrOXYzine (VISTARIL) 25 MG capsule Take 1 capsule (25 mg total) by mouth 3 (three) times daily as needed.  90 capsule  11  . Iron-Vitamins (GERITOL PO) Take by mouth.        . Lutein 20 MG CAPS Take 20 mg by mouth daily.        . magnesium oxide (MAG-OX) 400 MG tablet Take 400 mg by mouth daily.        [name] POTASSIUM GLUCONATE PO Take by mouth.        . pravastatin (PRAVACHOL) 20 MG tablet Take 1 tablet (20 mg total) by mouth daily.  90 tablet  2  . vitamin B-12 (CYANOCOBALAMIN) 1000 MCG tablet Take 1,000 mcg by mouth daily.        . vitamin E 400 UNIT capsule Take 400 Units by mouth daily.        [name] DISCONTD: clorazepate (TRANXENE) 7.5 MG tablet Take one and a half tablet daily at bedtime as needed for anxiety.  90 tablet  1  . DISCONTD: hydrOXYzine (VISTARIL) 25 MG capsule Take 25 mg by mouth 3 (three) times daily as needed.

## 2011-10-27 DIAGNOSIS — Z1239 Encounter for other screening for malignant neoplasm of breast: Secondary | ICD-10-CM | POA: Insufficient documentation

## 2011-10-27 DIAGNOSIS — Z1211 Encounter for screening for malignant neoplasm of colon: Secondary | ICD-10-CM | POA: Insufficient documentation

## 2011-10-27 NOTE — Assessment & Plan Note (Signed)
Anxiety is managed with clorazepate and  hydroxyzine. Refills provided today.

## 2011-10-27 NOTE — Assessment & Plan Note (Signed)
She's not interested in continuing colon cancer screening given her age.

## 2011-10-27 NOTE — Assessment & Plan Note (Signed)
She has a history deferred due to

## 2011-12-15 ENCOUNTER — Ambulatory Visit (INDEPENDENT_AMBULATORY_CARE_PROVIDER_SITE_OTHER): Payer: Medicare Other | Admitting: Internal Medicine

## 2011-12-15 ENCOUNTER — Encounter: Payer: Self-pay | Admitting: Internal Medicine

## 2011-12-15 ENCOUNTER — Telehealth: Payer: Self-pay | Admitting: Internal Medicine

## 2011-12-15 VITALS — BP 120/60 | HR 68 | Temp 98.0°F | Resp 12 | Ht 66.0 in | Wt 125.0 lb

## 2011-12-15 DIAGNOSIS — F419 Anxiety disorder, unspecified: Secondary | ICD-10-CM

## 2011-12-15 DIAGNOSIS — R259 Unspecified abnormal involuntary movements: Secondary | ICD-10-CM

## 2011-12-15 DIAGNOSIS — G4762 Sleep related leg cramps: Secondary | ICD-10-CM | POA: Insufficient documentation

## 2011-12-15 DIAGNOSIS — F411 Generalized anxiety disorder: Secondary | ICD-10-CM

## 2011-12-15 DIAGNOSIS — M5430 Sciatica, unspecified side: Secondary | ICD-10-CM

## 2011-12-15 DIAGNOSIS — G4700 Insomnia, unspecified: Secondary | ICD-10-CM

## 2011-12-15 DIAGNOSIS — G252 Other specified forms of tremor: Secondary | ICD-10-CM

## 2011-12-15 DIAGNOSIS — M5442 Lumbago with sciatica, left side: Secondary | ICD-10-CM

## 2011-12-15 DIAGNOSIS — Z1331 Encounter for screening for depression: Secondary | ICD-10-CM

## 2011-12-15 DIAGNOSIS — E785 Hyperlipidemia, unspecified: Secondary | ICD-10-CM

## 2011-12-15 DIAGNOSIS — M533 Sacrococcygeal disorders, not elsewhere classified: Secondary | ICD-10-CM

## 2011-12-15 LAB — COMPREHENSIVE METABOLIC PANEL
ALT: 15 U/L (ref 0–35)
AST: 24 U/L (ref 0–37)
Albumin: 4.1 g/dL (ref 3.5–5.2)
Alkaline Phosphatase: 46 U/L (ref 39–117)
BUN: 14 mg/dL (ref 6–23)
CO2: 29 mEq/L (ref 19–32)
Calcium: 9.3 mg/dL (ref 8.4–10.5)
Chloride: 102 mEq/L (ref 96–112)
Creatinine, Ser: 1 mg/dL (ref 0.4–1.2)
GFR: 56.93 mL/min — ABNORMAL LOW (ref >60.00)
Glucose, Bld: 97 mg/dL (ref 70–99)
Potassium: 4.4 mEq/L (ref 3.5–5.1)
Sodium: 137 mEq/L (ref 135–145)
Total Bilirubin: 0.5 mg/dL (ref 0.3–1.2)
Total Protein: 7.2 g/dL (ref 6.0–8.3)

## 2011-12-15 LAB — MAGNESIUM: Magnesium: 2 mg/dL (ref 1.5–2.5)

## 2011-12-15 NOTE — Telephone Encounter (Addendum)
error 

## 2011-12-15 NOTE — Assessment & Plan Note (Addendum)
Referral to neurology  offerered but  deferred .  Reassurance rovided that I do not think she has a clinical diagnosis of PD>

## 2011-12-15 NOTE — Patient Instructions (Addendum)
If you change you mind about seeing a neurologist,  Let me know    Do not resume your potassium or magnesium unless we direct yo to after reviewing your labs today    You can try 1/2 of a benadryl as a substitution for the hydroxyzine if it is too expensive

## 2011-12-15 NOTE — Progress Notes (Signed)
Patient ID: [name], female   DOB: May 13, 1926, 78 y.o.   MRN: [id number]  Patient Active Problem List  Diagnosis  . Elevated blood pressure reading without diagnosis of hypertension  . Chronic orthostatic hypotension  . Anxiety  . Tremor, coarse  . Other and unspecified hyperlipidemia  . Back pain, sacroiliac  . Hyperlipidemia  . Insomnia  . Lumbago with sciatica of left side  . Screening for colon cancer  . Screening for breast cancer  . Nocturnal leg cramps    Subjective:  CC:   Chief Complaint  Patient presents with  . Medication Problem    HPI:   Gwendolyn Hubbardis a 78 y.o. female who presents with anxiety leg cramps and tremor. She stopped the OTC  potassium and magnesium at night time one week ago,  Per  RN directive   And the leg cramps that were  Occurring haveesolved. She is concerned that her tremor is a sing of Parkinson's disease.  She has no history of falls, dizzy spells, or touble initiating movement.  She is using clorazepate  for chronic insomnia and hydroxyzine for anxiety,.    Past Medical History  Diagnosis Date  . Chronic orthostatic hypotension June 2012    with pprior syncopal event,  did not tolelrate florinef trial  . Anxiety   . Hyperlipidemia     History reviewed. No pertinent past surgical history.   The following portions of the patient's history were reviewed and updated as appropriate: Allergies, current medications, and problem list.    Review of Systems:   Patient denies headache, fevers, malaise, unintentional weight loss, skin rash, eye pain, sinus congestion and sinus pain, sore throat, dysphagia,  hemoptysis , cough, dyspnea, wheezing, chest pain, palpitations, orthopnea, edema, abdominal pain, nausea, melena, diarrhea, constipation, flank pain, dysuria, hematuria, urinary  Frequency, nocturia, numbness, tingling, seizures,  Focal weakness, Loss of consciousness,  and suicidal ideation.       History   Social History  .  Marital Status: Married    Spouse Name: N/A    Number of Children: N/A  . Years of Education: N/A   Occupational History  . Not on file.   Social History Main Topics  . Smoking status: Never Smoker   . Smokeless tobacco: Never Used  . Alcohol Use: No  . Drug Use: No  . Sexually Active: Not on file   Other Topics Concern  . Not on file   Social History Narrative  . No narrative on file    Objective:  BP 120/60  Pulse 68  Temp 98 F (36.7 C) (Oral)  Resp 12  Ht 5\' 6"  (1.676 m)  Wt 125 lb (56.7 kg)  BMI 20.18 kg/m2  SpO2 96%  General appearance: alert, cooperative and appears stated age Ears: normal TM's and external ear canals both ears Throat: lips, mucosa, and tongue normal; teeth and gums normal Neck: no adenopathy, no carotid bruit, supple, symmetrical, trachea midline and thyroid not enlarged, symmetric, no tenderness/mass/nodules Back: symmetric, no curvature. ROM normal. No CVA tenderness. Lungs: clear to auscultation bilaterally Heart: regular rate and rhythm, S1, S2 normal, no murmur, click, rub or gallop Abdomen: soft, non-tender; bowel sounds normal; no masses,  no organomegaly Pulses: 2+ and symmetric Skin: Skin color, texture, turgor normal. No rashes or lesions Lymph nodes: Cervical, supraclavicular, and axillary nodes normal.  Assessment and Plan:  Tremor, coarse Referral to neurology  offerered but  deferred .  Reassurance rovided that I do not think she has a  clinical diagnosis of PD>  Anxiety Aggravated by husband's chronic pain and insomnia due to spinal stenosis.  Continue current medications. Refills given,.   Back pain, sacroiliac Chronic, nonradiating.  Managed with prn medications and activity modificaiton   Lumbago with sciatica of left side Managed with prn NSAIDs and activity modification.   Insomnia managed with tranxene qhs.  Refill given.   Hyperlipidemia Well controlled, lfts normal.,  No changes today   Updated  Medication List Outpatient Encounter Prescriptions as of 12/15/2011  Medication Sig Dispense Refill  . aspirin 81 MG tablet Take 81 mg by mouth daily.        . clorazepate (TRANXENE) 7.5 MG tablet Take 1 tablet (7.5 mg total) by mouth 3 (three) times daily as needed for anxiety.  90 tablet  1  . hydrOXYzine (VISTARIL) 25 MG capsule Take 1 capsule (25 mg total) by mouth 3 (three) times daily as needed.  90 capsule  11  . Iron-Vitamins (GERITOL PO) Take by mouth.        . Lutein 20 MG CAPS Take 20 mg by mouth daily.        . pravastatin (PRAVACHOL) 20 MG tablet Take 1 tablet (20 mg total) by mouth daily.  90 tablet  2  . vitamin B-12 (CYANOCOBALAMIN) 1000 MCG tablet Take 1,000 mcg by mouth daily.        . vitamin E 400 UNIT capsule Take 400 Units by mouth daily.        . furosemide (LASIX) 20 MG tablet Take 1 tablet (20 mg total) by mouth daily.  30 tablet  3  . magnesium oxide (MAG-OX) 400 MG tablet Take 400 mg by mouth daily.       [name] POTASSIUM GLUCONATE PO Take by mouth.

## 2011-12-19 ENCOUNTER — Encounter: Payer: Self-pay | Admitting: Internal Medicine

## 2011-12-19 NOTE — Assessment & Plan Note (Addendum)
Managed with prn NSAIDs and activity modification.

## 2011-12-19 NOTE — Assessment & Plan Note (Signed)
Well controlled, lfts normal.,  No changes today

## 2011-12-19 NOTE — Assessment & Plan Note (Signed)
Chronic, nonradiating.  Managed with prn medications and activity modificaiton

## 2011-12-19 NOTE — Assessment & Plan Note (Signed)
managed with tranxene qhs.  Refill given.

## 2011-12-19 NOTE — Assessment & Plan Note (Signed)
Aggravated by husband's chronic pain and insomnia due to spinal stenosis.  Continue current medications. Refills given,.

## 2012-01-14 ENCOUNTER — Telehealth: Payer: Self-pay | Admitting: General Practice

## 2012-01-14 NOTE — Telephone Encounter (Signed)
Approval for Zolpidem Tartrate found and faxed to Pharmacy, Approved until 04/04/12.

## 2012-02-22 ENCOUNTER — Other Ambulatory Visit: Payer: Self-pay | Admitting: *Deleted

## 2012-02-24 MED ORDER — CLORAZEPATE DIPOTASSIUM 7.5 MG PO TABS: 7.5000 mg | ORAL_TABLET | Freq: Three times a day (TID) | ORAL | Status: DC | PRN

## 2012-03-03 ENCOUNTER — Ambulatory Visit (INDEPENDENT_AMBULATORY_CARE_PROVIDER_SITE_OTHER): Payer: Medicare Other | Admitting: Adult Health

## 2012-03-03 ENCOUNTER — Encounter: Payer: Self-pay | Admitting: Adult Health

## 2012-03-03 VITALS — BP 112/66 | HR 75 | Temp 98.2°F | Resp 14 | Ht 66.0 in | Wt 123.0 lb

## 2012-03-03 DIAGNOSIS — R52 Pain, unspecified: Secondary | ICD-10-CM

## 2012-03-03 DIAGNOSIS — M5430 Sciatica, unspecified side: Secondary | ICD-10-CM

## 2012-03-03 DIAGNOSIS — M5442 Lumbago with sciatica, left side: Secondary | ICD-10-CM

## 2012-03-03 MED ORDER — TRAMADOL HCL 50 MG PO TABS: 50.0000 mg | ORAL_TABLET | Freq: Three times a day (TID) | ORAL | Status: DC | PRN

## 2012-03-03 NOTE — Assessment & Plan Note (Signed)
Will order repeat MRI 2/2 new finding of weakness as well as patient's report of increased weakness notice with ambulation. Ordered ultram for pain to see if this helps relieve her discomfort.

## 2012-03-03 NOTE — Progress Notes (Signed)
  Subjective:    Patient ID: [name], female    DOB: 15-Dec-1926, 79 y.o.   MRN: [id number]  HPI   Mrs.  [name] is an 79 y/o female who presents to clinic today with complaints of low back pain that radiates down the left buttocks, left leg to mid thigh. She reports that pain was aggravated on Sunday after she spent several hours cooking Saturday. I believe she said she was making  Jam. The pain has intensified. She has tried taking Tylenol and Motrin without any improvement. She feels better when she is lying down. The pain is aggravated with standing or sitting. Now patient has weakness in the left lower extremity. She has been to see her chiropractor, Dr. Meredeth Ide. Patient reports that her symptoms are improved while she is having the therapy at the chiropractors office but then the symptoms return. Patient is concerned because she is the sole caregiver for her ailing husband.  Patient had an MRI done in January of 2013. Her MRI showed multilevel mild degenerative disc disease associated with scoliosis convex toward the left; facet joint changes. There was no high grade spinal stenosis or neural foraminal encroachment.    Review of Systems  Constitutional: Positive for activity change. Negative for fever and chills.       Normally very active but recent aggravation of LBP is limiting activity  Neurological: Positive for weakness. Negative for dizziness, tremors, numbness and headaches.       Reports recent weakness in left lower extremity - "I can feel it when I am standing".  Psychiatric/Behavioral: Negative.     BP 112/66  Pulse 75  Temp(Src) 98.2 F (36.8 C) (Oral)  Resp 14  Ht 5\' 6"  (1.676 m)  Wt 123 lb (55.792 kg)  BMI 19.86 kg/m2  SpO2 98%     Objective:   Physical Exam  Constitutional: She is oriented to person, place, and time. She appears well-developed and well-nourished. No distress.  Well dressed.  Cardiovascular: Normal rate and regular rhythm.    Pulmonary/Chest: Effort normal.  Musculoskeletal: She exhibits tenderness.  Tenderness upon palpation L5-S1 area. Also pain upon palpation of left sacroiliac joint. Weakness noted in lifting knee against resistance on the left as compared to the right.  Neurological: She is alert and oriented to person, place, and time. Coordination normal.  Psychiatric: She has a normal mood and affect. Her behavior is normal. Judgment and thought content normal.        Assessment & Plan:

## 2012-03-03 NOTE — Patient Instructions (Signed)
  I am ordering an MRI of the lumbar spine.

## 2012-03-07 ENCOUNTER — Ambulatory Visit: Payer: Medicare Other | Admitting: Internal Medicine

## 2012-03-09 ENCOUNTER — Ambulatory Visit: Payer: Self-pay | Admitting: Internal Medicine

## 2012-03-11 ENCOUNTER — Ambulatory Visit: Payer: Medicare Other | Admitting: Internal Medicine

## 2012-03-14 ENCOUNTER — Encounter: Payer: Self-pay | Admitting: Internal Medicine

## 2012-03-14 ENCOUNTER — Ambulatory Visit (INDEPENDENT_AMBULATORY_CARE_PROVIDER_SITE_OTHER): Payer: Medicare Other | Admitting: Internal Medicine

## 2012-03-14 VITALS — BP 122/72 | HR 76 | Temp 98.1°F | Resp 15 | Wt 124.2 lb

## 2012-03-14 DIAGNOSIS — M5430 Sciatica, unspecified side: Secondary | ICD-10-CM

## 2012-03-14 DIAGNOSIS — M5442 Lumbago with sciatica, left side: Secondary | ICD-10-CM

## 2012-03-14 MED ORDER — HYDROCODONE-ACETAMINOPHEN 5-325 MG PO TABS: 1.0000 | ORAL_TABLET | Freq: Three times a day (TID) | ORAL | Status: DC | PRN

## 2012-03-14 NOTE — Progress Notes (Signed)
Patient ID: [name], female   DOB: December 11, 1926, 79 y.o.   MRN: [id number]   Patient Active Problem List  Diagnosis  . Chronic orthostatic hypotension  . Anxiety  . Tremor, coarse  . Other and unspecified hyperlipidemia  . Back pain, sacroiliac  . Hyperlipidemia  . Insomnia  . Lumbago with sciatica of left side  . Screening for colon cancer  . Screening for breast cancer  . Nocturnal leg cramps    Subjective:  CC:   Chief Complaint  Patient presents with  . Follow-up    MRI    HPI:   Gwendolyn Hubbardis a 79 y.o. female who presents Followup on recent MRI of lumbar spine. Patient requested repeat evaluation for persistent low back pain which continues to plague her a day and night. Her pain radiates to her leg. She is sleeping at night with the use of tramadol and clorazepate but states of the tramadol makes her feel bad. The pain is aggravated by pushing her husband around in a wheelchair. The MRI showed severe scoliosis and degenerative changes with progression of changes at L2-L3 showing a progressive right paracentral bulge and progressive flattening of the right lateral recess at this level.     Past Medical History  Diagnosis Date  . Chronic orthostatic hypotension June 2012    with pprior syncopal event,  did not tolelrate florinef trial  . Anxiety   . Hyperlipidemia     History reviewed. No pertinent past surgical history.     The following portions of the patient's history were reviewed and updated as appropriate: Allergies, current medications, and problem list.    Review of Systems:   Patient denies headache, fevers, malaise, unintentional weight loss, skin rash, eye pain, sinus congestion and sinus pain, sore throat, dysphagia,  hemoptysis , cough, dyspnea, wheezing, chest pain, palpitations, orthopnea, edema, abdominal pain, nausea, melena, diarrhea, constipation, flank pain, dysuria, hematuria, urinary  Frequency, nocturia, numbness, tingling,  seizures,  Focal weakness, Loss of consciousness,   insomnia, depression and suicidal ideation.     History   Social History  . Marital Status: Married    Spouse Name: N/A    Number of Children: N/A  . Years of Education: N/A   Occupational History  . Not on file.   Social History Main Topics  . Smoking status: Never Smoker   . Smokeless tobacco: Never Used  . Alcohol Use: No  . Drug Use: No  . Sexually Active: Not on file   Other Topics Concern  . Not on file   Social History Narrative  . No narrative on file    Objective:  BP 122/72  Pulse 76  Temp(Src) 98.1 F (36.7 C) (Oral)  Resp 15  Wt 124 lb 4 oz (56.359 kg)  BMI 20.06 kg/m2  General appearance: alert, cooperative and appears stated age Ears: normal TM's and external ear canals both ears Throat: lips, mucosa, and tongue normal; teeth and gums normal Neck: no adenopathy, no carotid bruit, supple, symmetrical, trachea midline and thyroid not enlarged, symmetric, no tenderness/mass/nodules Back: symmetric, no curvature. ROM normal. No CVA tenderness. Lungs: clear to auscultation bilaterally Heart: regular rate and rhythm, S1, S2 normal, no murmur, click, rub or gallop Abdomen: soft, non-tender; bowel sounds normal; no masses,  no organomegaly Pulses: 2+ and symmetric Skin: Skin color, texture, turgor normal. No rashes or lesions Lymph nodes: Cervical, supraclavicular, and axillary nodes normal.  Assessment and Plan:  Lumbago with sciatica of left side She has severe scoliosis  and degenerative changes with progression of degenerative changes at L2-L3 causing paracentral annular bulge to the right and disc protrusion with flattening of the right lateral recess by MRI dated 03/09/2012. Her pain is not well controlled on tramadol due to side effects of tramadol. Her pain is aggravated by pushing her husband in her wheelchair. She does not have changes consistent with the need for surgery. I'm recommending that she  be referred to the pain clinic Center for epidural injection. She has some misgivings about this since her husband's pain was not controlled well with epidural injections. I reminded her that he has severe spinal stenosis is not severe. I am sending a prescription for Vicodin to control her pain. This is in place and the tramadol.   Updated Medication List Outpatient Encounter Prescriptions as of 03/14/2012  Medication Sig Dispense Refill  . aspirin 81 MG tablet Take 81 mg by mouth daily.        . clorazepate (TRANXENE) 7.5 MG tablet Take 1 tablet (7.5 mg total) by mouth 3 (three) times daily as needed for anxiety.  90 tablet  1  . hydrOXYzine (VISTARIL) 25 MG capsule Take 1 capsule (25 mg total) by mouth 3 (three) times daily as needed.  90 capsule  11  . Iron-Vitamins (GERITOL PO) Take by mouth.        . Lutein 20 MG CAPS Take 20 mg by mouth daily.        . pravastatin (PRAVACHOL) 20 MG tablet Take 1 tablet (20 mg total) by mouth daily.  90 tablet  2  . traMADol (ULTRAM) 50 MG tablet Take 1 tablet (50 mg total) by mouth every 8 (eight) hours as needed for pain.  30 tablet  1  . vitamin B-12 (CYANOCOBALAMIN) 1000 MCG tablet Take 1,000 mcg by mouth daily.        . vitamin E 400 UNIT capsule Take 400 Units by mouth daily.        . furosemide (LASIX) 20 MG tablet Take 1 tablet (20 mg total) by mouth daily.  30 tablet  3  . HYDROcodone-acetaminophen (NORCO/VICODIN) 5-325 MG per tablet Take 1 tablet by mouth every 8 (eight) hours as needed for pain.  90 tablet  3  . hydrOXYzine (ATARAX/VISTARIL) 25 MG tablet Take 25 mg by mouth every 6 (six) hours as needed.       . magnesium oxide (MAG-OX) 400 MG tablet Take 400 mg by mouth daily.       [name] POTASSIUM GLUCONATE PO Take by mouth.         No facility-administered encounter medications on file as of 03/14/2012.     Orders Placed This Encounter  Procedures  . Ambulatory referral to Pain Clinic    No Follow-up on file.

## 2012-03-14 NOTE — Patient Instructions (Addendum)
You can take an extra hydroxyzine if needed to help you fall back asleep   The new medication for pain (hydrocodoen) may make you sleepy , so make sure you are at home   Magnesium citrate is powerful and will work within the hour.  Do not take more than once  A week  use nightly dulcolax,  Ex lax or correctol for relief by morning  ,  Ok to take every night if takign a narcotic (pain medication that is prescribed)    Your MRI shows progressive degenerative changes but worse on the right side, not the left   I will be happy to  refer you to the Pain Clinic to see if a shot will help

## 2012-03-15 ENCOUNTER — Encounter: Payer: Self-pay | Admitting: Internal Medicine

## 2012-03-15 NOTE — Assessment & Plan Note (Signed)
She has severe scoliosis and degenerative changes with progression of degenerative changes at L2-L3 causing paracentral annular bulge to the right and disc protrusion with flattening of the right lateral recess by MRI dated 03/09/2012. Her pain is not well controlled on tramadol due to side effects of tramadol. Her pain is aggravated by pushing her husband in her wheelchair. She does not have changes consistent with the need for surgery. I'm recommending that she be referred to the pain clinic Center for epidural injection. She has some misgivings about this since her husband's pain was not controlled well with epidural injections. I reminded her that he has severe spinal stenosis is not severe. I am sending a prescription for Vicodin to control her pain. This is in place and the tramadol.

## 2012-03-23 ENCOUNTER — Ambulatory Visit: Payer: Self-pay | Admitting: Pain Medicine

## 2012-03-30 ENCOUNTER — Ambulatory Visit: Payer: Self-pay | Admitting: Pain Medicine

## 2012-04-01 ENCOUNTER — Encounter: Payer: Self-pay | Admitting: Internal Medicine

## 2012-05-23 ENCOUNTER — Other Ambulatory Visit: Payer: Self-pay | Admitting: *Deleted

## 2012-05-23 MED ORDER — PRAVASTATIN SODIUM 20 MG PO TABS: 20.0000 mg | ORAL_TABLET | Freq: Every day | ORAL | Status: DC

## 2012-05-23 NOTE — Telephone Encounter (Signed)
Rx sent to pharmacy by escript  

## 2012-05-27 ENCOUNTER — Telehealth: Payer: Self-pay | Admitting: Internal Medicine

## 2012-05-27 ENCOUNTER — Encounter: Payer: Self-pay | Admitting: Internal Medicine

## 2012-05-27 ENCOUNTER — Ambulatory Visit (INDEPENDENT_AMBULATORY_CARE_PROVIDER_SITE_OTHER): Payer: Medicare Other | Admitting: Internal Medicine

## 2012-05-27 VITALS — BP 150/68 | HR 76 | Temp 97.7°F | Resp 16 | Wt 126.8 lb

## 2012-05-27 DIAGNOSIS — R252 Cramp and spasm: Secondary | ICD-10-CM

## 2012-05-27 DIAGNOSIS — L97929 Non-pressure chronic ulcer of unspecified part of left lower leg with unspecified severity: Secondary | ICD-10-CM

## 2012-05-27 DIAGNOSIS — I872 Venous insufficiency (chronic) (peripheral): Secondary | ICD-10-CM

## 2012-05-27 DIAGNOSIS — M533 Sacrococcygeal disorders, not elsewhere classified: Secondary | ICD-10-CM

## 2012-05-27 DIAGNOSIS — R350 Frequency of micturition: Secondary | ICD-10-CM

## 2012-05-27 DIAGNOSIS — I83018 Varicose veins of right lower extremity with ulcer other part of lower leg: Secondary | ICD-10-CM

## 2012-05-27 DIAGNOSIS — I83011 Varicose veins of right lower extremity with ulcer of thigh: Secondary | ICD-10-CM

## 2012-05-27 LAB — POCT URINALYSIS DIPSTICK
Bilirubin, UA: NEGATIVE
Blood, UA: NEGATIVE
Glucose, UA: NEGATIVE
Ketones, UA: NEGATIVE
Leukocytes, UA: NEGATIVE
Nitrite, UA: NEGATIVE
Protein, UA: NEGATIVE
Spec Grav, UA: <=1.005
Urobilinogen, UA: 0.2
pH, UA: 6

## 2012-05-27 LAB — COMPREHENSIVE METABOLIC PANEL
ALT: 16 U/L (ref 0–35)
AST: 23 U/L (ref 0–37)
Albumin: 4 g/dL (ref 3.5–5.2)
Alkaline Phosphatase: 43 U/L (ref 39–117)
BUN: 11 mg/dL (ref 6–23)
CO2: 28 mEq/L (ref 19–32)
Calcium: 9 mg/dL (ref 8.4–10.5)
Chloride: 100 mEq/L (ref 96–112)
Creatinine, Ser: 0.8 mg/dL (ref 0.4–1.2)
GFR: 71.69 mL/min (ref >60.00)
Glucose, Bld: 93 mg/dL (ref 70–99)
Potassium: 3.8 mEq/L (ref 3.5–5.1)
Sodium: 134 mEq/L — ABNORMAL LOW (ref 135–145)
Total Bilirubin: 0.5 mg/dL (ref 0.3–1.2)
Total Protein: 6.8 g/dL (ref 6.0–8.3)

## 2012-05-27 LAB — CBC WITH DIFFERENTIAL/PLATELET
Basophils Absolute: 0.1 10*3/uL (ref 0.0–0.1)
Basophils Relative: 0.8 % (ref 0.0–3.0)
Eosinophils Absolute: 0.1 10*3/uL (ref 0.0–0.7)
Eosinophils Relative: 1.1 % (ref 0.0–5.0)
HCT: 36 % (ref 36.0–46.0)
Hemoglobin: 12.4 g/dL (ref 12.0–15.0)
Lymphocytes Relative: 28.5 % (ref 12.0–46.0)
Lymphs Abs: 1.9 10*3/uL (ref 0.7–4.0)
MCHC: 34.6 g/dL (ref 30.0–36.0)
MCV: 97.6 fl (ref 78.0–100.0)
Monocytes Absolute: 0.6 10*3/uL (ref 0.1–1.0)
Monocytes Relative: 8.4 % (ref 3.0–12.0)
Neutro Abs: 4 10*3/uL (ref 1.4–7.7)
Neutrophils Relative %: 61.2 % (ref 43.0–77.0)
Platelets: 246 10*3/uL (ref 150.0–400.0)
RBC: 3.69 Mil/uL — ABNORMAL LOW (ref 3.87–5.11)
RDW: 13.1 % (ref 11.5–14.6)
WBC: 6.6 10*3/uL (ref 4.5–10.5)

## 2012-05-27 LAB — TSH: TSH: 0.74 u[IU]/mL (ref 0.35–5.50)

## 2012-05-27 NOTE — Telephone Encounter (Signed)
Referral is in process as requested 

## 2012-05-27 NOTE — Telephone Encounter (Signed)
The patient has changed her mind she will go the wound center.

## 2012-05-27 NOTE — Patient Instructions (Addendum)
Your wound on your right lower leg needs debridement and should not be cleaned with anything but sterile saline   The hydrogen peroxide is killing good cells along with the bad . The oitment may be aggravating the skin due to an allergic reaction  I am recommending that you allow me to send you the Wound center sinc eit has not healed in 5 weeks   I am checking your electrolytes to make sure they are not caused by deficiencies

## 2012-05-27 NOTE — Progress Notes (Signed)
Patient ID: [name], female   DOB: 24-Sep-1926, 79 y.o.   MRN: [id number]   Patient Active Problem List   Diagnosis Date Noted  . Urinary frequency 05/29/2012  . Venous ulcer of left lower extremity without varicose veins 05/29/2012  . Nocturnal leg cramps 12/15/2011  . Screening for colon cancer 10/27/2011  . Screening for breast cancer 10/27/2011  . Lumbago with sciatica of left side 09/01/2011  . Insomnia 01/07/2011  . Hyperlipidemia 11/12/2010  . Other and unspecified hyperlipidemia 11/08/2010  . Back pain, sacroiliac 11/08/2010  . Tremor, coarse 09/21/2010  . Chronic orthostatic hypotension   . Anxiety     Subjective:  CC:   Chief Complaint  Patient presents with  . Acute Visit    polyuria , headache dull ,     HPI:   Gwendolyn Hubbardis a 79 y.o. female who presents Urinary frequency.  Symptoms have been present fo 48 hours, urinating at times hourly in the evening hours, with what feels like a full bladder with each void.  No burning or suprapubic pain .  No fevers, nuchal rigidity or vision changes.  No recent travel, no working in the yard like she used to for hours at a time.    Past Medical History  Diagnosis Date  . Chronic orthostatic hypotension June 2012    with pprior syncopal event,  did not tolelrate florinef trial  . Anxiety   . Hyperlipidemia     History reviewed. No pertinent past surgical history.     The following portions of the patient's history were reviewed and updated as appropriate: Allergies, current medications, and problem list.    Review of Systems:   12 Pt  review of systems was negative except those addressed in the HPI,     History   Social History  . Marital Status: Married    Spouse Name: N/A    Number of Children: N/A  . Years of Education: N/A   Occupational History  . Not on file.   Social History Main Topics  . Smoking status: Never Smoker   . Smokeless tobacco: Never Used  . Alcohol Use: No  . Drug  Use: No  . Sexually Active: Not on file   Other Topics Concern  . Not on file   Social History Narrative  . No narrative on file    Objective:  BP 150/68  Pulse 76  Temp(Src) 97.7 F (36.5 C) (Oral)  Resp 16  Wt 126 lb 12 oz (57.493 kg)  BMI 20.47 kg/m2  SpO2 98%  General appearance: alert, cooperative and appears stated age Ears: normal TM's and external ear canals both ears Throat: lips, mucosa, and tongue normal; teeth and gums normal Neck: no adenopathy, no carotid bruit, supple, symmetrical, trachea midline and thyroid not enlarged, symmetric, no tenderness/mass/nodules Back: symmetric, no curvature. ROM normal. No CVA tenderness. Lungs: clear to auscultation bilaterally Heart: regular rate and rhythm, S1, S2 normal, no murmur, click, rub or gallop Abdomen: soft, non-tender; bowel sounds normal; no masses,  no organomegaly Pulses: 2+ and symmetric Skin: 3 cm stage 2 ulcer right medial leg with granulation tissue, slough and periwound erythema Lymph nodes: Cervical, supraclavicular, and axillary nodes normal.  Assessment and Plan:  Urinary frequency No signs of UTI or glucosuria by UA. Symptoms are improving spontaneously and were likely diet related. Reassurance provided.   Venous ulcer of left lower extremity without varicose veins She was noted to have a nonhealing ulcer of the right medial LE which  occurred 5 weeks ago iatrogenically during removal of a skin ca by Dr, Orson Aloe, her dermatologist.  Bopsy report is not available. Paitne thas been using hydrogen peroxide nad soap and water per instructions from Dr Orson Aloe. Referral to wound center.   Back pain, sacroiliac No improvement after recent epidural injection. Continue vicodin and tramadol prn .    Updated Medication List Outpatient Encounter Prescriptions as of 05/27/2012  Medication Sig Dispense Refill  . aspirin 81 MG tablet Take 81 mg by mouth daily.        . Black Cohosh (BLACK COHOSH HOT FLASH  RELIEF) 40 MG CAPS Take 1 capsule by mouth daily.      . clorazepate (TRANXENE) 7.5 MG tablet Take 1 tablet (7.5 mg total) by mouth 3 (three) times daily as needed for anxiety.  90 tablet  1  . HYDROcodone-acetaminophen (NORCO/VICODIN) 5-325 MG per tablet Take 1 tablet by mouth every 8 (eight) hours as needed for pain.  90 tablet  3  . hydrOXYzine (ATARAX/VISTARIL) 25 MG tablet Take 25 mg by mouth every 6 (six) hours as needed.       . hydrOXYzine (VISTARIL) 25 MG capsule Take 1 capsule (25 mg total) by mouth 3 (three) times daily as needed.  90 capsule  11  . Iron-Vitamins (GERITOL PO) Take by mouth.        . Lutein 20 MG CAPS Take 20 mg by mouth daily.        . magnesium oxide (MAG-OX) 400 MG tablet Take 400 mg by mouth daily.       [name] POTASSIUM GLUCONATE PO Take by mouth.        . pravastatin (PRAVACHOL) 20 MG tablet Take 1 tablet (20 mg total) by mouth daily.  90 tablet  1  . vitamin B-12 (CYANOCOBALAMIN) 1000 MCG tablet Take 1,000 mcg by mouth daily.        . vitamin E 400 UNIT capsule Take 400 Units by mouth daily.        . furosemide (LASIX) 20 MG tablet Take 1 tablet (20 mg total) by mouth daily.  30 tablet  3  . traMADol (ULTRAM) 50 MG tablet Take 1 tablet (50 mg total) by mouth every 8 (eight) hours as needed for pain.  30 tablet  1   No facility-administered encounter medications on file as of 05/27/2012.     Orders Placed This Encounter  Procedures  . CBC with Differential  . Comprehensive metabolic panel  . TSH  . POCT urinalysis dipstick    No Follow-up on file.

## 2012-05-29 ENCOUNTER — Encounter: Payer: Self-pay | Admitting: Internal Medicine

## 2012-05-29 DIAGNOSIS — L97929 Non-pressure chronic ulcer of unspecified part of left lower leg with unspecified severity: Secondary | ICD-10-CM | POA: Insufficient documentation

## 2012-05-29 DIAGNOSIS — N3941 Urge incontinence: Secondary | ICD-10-CM | POA: Insufficient documentation

## 2012-05-29 DIAGNOSIS — I872 Venous insufficiency (chronic) (peripheral): Secondary | ICD-10-CM | POA: Insufficient documentation

## 2012-05-29 NOTE — Assessment & Plan Note (Signed)
She was noted to have a nonhealing ulcer of the right medial LE which occurred 5 weeks ago iatrogenically during removal of a skin ca by Dr, Orson Aloe, her dermatologist.  Bopsy report is not available. Paitne thas been using hydrogen peroxide nad soap and water per instructions from Dr Orson Aloe. Referral to wound center.

## 2012-05-29 NOTE — Assessment & Plan Note (Addendum)
No improvement after recent epidural injection. Continue vicodin and tramadol prn .

## 2012-05-29 NOTE — Assessment & Plan Note (Signed)
No signs of UTI or glucosuria by UA. Symptoms are improving spontaneously and were likely diet related. Reassurance provided.

## 2012-05-31 ENCOUNTER — Telehealth: Payer: Self-pay | Admitting: Internal Medicine

## 2012-05-31 ENCOUNTER — Encounter: Payer: Self-pay | Admitting: *Deleted

## 2012-05-31 NOTE — Telephone Encounter (Signed)
Tried to return call to find out why patient needs to be seen left message patient not at home.

## 2012-05-31 NOTE — Telephone Encounter (Signed)
Patient scheduled next week at wound center to look at leg.

## 2012-05-31 NOTE — Telephone Encounter (Signed)
Pt called stated she called the wound center in [place].  They told her it would be next week before they could get her in.  She wanted to know if you could get her in sooner

## 2012-06-06 ENCOUNTER — Encounter: Payer: Self-pay | Admitting: Cardiothoracic Surgery

## 2012-06-06 ENCOUNTER — Encounter: Payer: Self-pay | Admitting: Nurse Practitioner

## 2012-07-05 ENCOUNTER — Encounter: Payer: Self-pay | Admitting: Cardiothoracic Surgery

## 2012-07-05 ENCOUNTER — Encounter: Payer: Self-pay | Admitting: Nurse Practitioner

## 2012-08-22 ENCOUNTER — Other Ambulatory Visit: Payer: Self-pay | Admitting: *Deleted

## 2012-08-24 ENCOUNTER — Other Ambulatory Visit: Payer: Self-pay | Admitting: Internal Medicine

## 2012-08-24 NOTE — Telephone Encounter (Signed)
Pt called checking on her rx for clorazepatedipotass  7.5mg  [place] Pt stated her drug store requested this on Monday Please advise pt when this is called in,.

## 2012-08-25 MED ORDER — CLORAZEPATE DIPOTASSIUM 7.5 MG PO TABS: 7.5000 mg | ORAL_TABLET | Freq: Three times a day (TID) | ORAL | Status: DC | PRN

## 2012-08-25 NOTE — Telephone Encounter (Signed)
Last OV 5/14, have printed RX for your signature. Script will be sent when signed.

## 2012-08-25 NOTE — Telephone Encounter (Signed)
Script signed and faxed.

## 2012-11-30 ENCOUNTER — Other Ambulatory Visit: Payer: Self-pay | Admitting: Internal Medicine

## 2012-11-30 MED ORDER — PRAVASTATIN SODIUM 20 MG PO TABS: 20.0000 mg | ORAL_TABLET | Freq: Every day | ORAL | Status: DC

## 2012-12-21 ENCOUNTER — Ambulatory Visit (INDEPENDENT_AMBULATORY_CARE_PROVIDER_SITE_OTHER): Payer: Medicare Other | Admitting: Internal Medicine

## 2012-12-21 VITALS — BP 126/80 | HR 86 | Temp 97.5°F | Wt 128.0 lb

## 2012-12-21 DIAGNOSIS — E559 Vitamin D deficiency, unspecified: Secondary | ICD-10-CM

## 2012-12-21 DIAGNOSIS — I951 Orthostatic hypotension: Secondary | ICD-10-CM

## 2012-12-21 DIAGNOSIS — M533 Sacrococcygeal disorders, not elsewhere classified: Secondary | ICD-10-CM

## 2012-12-21 DIAGNOSIS — Z79899 Other long term (current) drug therapy: Secondary | ICD-10-CM

## 2012-12-21 DIAGNOSIS — R5381 Other malaise: Secondary | ICD-10-CM

## 2012-12-21 DIAGNOSIS — F411 Generalized anxiety disorder: Secondary | ICD-10-CM

## 2012-12-21 DIAGNOSIS — F419 Anxiety disorder, unspecified: Secondary | ICD-10-CM

## 2012-12-21 DIAGNOSIS — E785 Hyperlipidemia, unspecified: Secondary | ICD-10-CM

## 2012-12-21 LAB — CBC WITH DIFFERENTIAL/PLATELET
Basophils Absolute: 0 10*3/uL (ref 0.0–0.1)
Basophils Relative: 0.6 % (ref 0.0–3.0)
Eosinophils Absolute: 0.1 10*3/uL (ref 0.0–0.7)
Eosinophils Relative: 1.8 % (ref 0.0–5.0)
HCT: 37.8 % (ref 36.0–46.0)
Hemoglobin: 12.9 g/dL (ref 12.0–15.0)
Lymphocytes Relative: 24.5 % (ref 12.0–46.0)
Lymphs Abs: 1.6 10*3/uL (ref 0.7–4.0)
MCHC: 34.1 g/dL (ref 30.0–36.0)
MCV: 95.1 fl (ref 78.0–100.0)
Monocytes Absolute: 0.5 10*3/uL (ref 0.1–1.0)
Monocytes Relative: 7.8 % (ref 3.0–12.0)
Neutro Abs: 4.2 10*3/uL (ref 1.4–7.7)
Neutrophils Relative %: 65.3 % (ref 43.0–77.0)
Platelets: 233 10*3/uL (ref 150.0–400.0)
RBC: 3.97 Mil/uL (ref 3.87–5.11)
RDW: 13 % (ref 11.5–14.6)
WBC: 6.5 10*3/uL (ref 4.5–10.5)

## 2012-12-21 LAB — COMPREHENSIVE METABOLIC PANEL
ALT: 13 U/L (ref 0–35)
AST: 21 U/L (ref 0–37)
Albumin: 4.4 g/dL (ref 3.5–5.2)
Alkaline Phosphatase: 47 U/L (ref 39–117)
BUN: 13 mg/dL (ref 6–23)
CO2: 28 mEq/L (ref 19–32)
Calcium: 9.5 mg/dL (ref 8.4–10.5)
Chloride: 104 mEq/L (ref 96–112)
Creatinine, Ser: 0.9 mg/dL (ref 0.4–1.2)
GFR: 62.59 mL/min (ref >60.00)
Glucose, Bld: 94 mg/dL (ref 70–99)
Potassium: 4.6 mEq/L (ref 3.5–5.1)
Sodium: 140 mEq/L (ref 135–145)
Total Bilirubin: 0.8 mg/dL (ref 0.3–1.2)
Total Protein: 7.1 g/dL (ref 6.0–8.3)

## 2012-12-21 LAB — TSH: TSH: 1.91 u[IU]/mL (ref 0.35–5.50)

## 2012-12-21 MED ORDER — HYDROCODONE-ACETAMINOPHEN 5-325 MG PO TABS: 1.0000 | ORAL_TABLET | Freq: Three times a day (TID) | ORAL | Status: DC | PRN

## 2012-12-21 MED ORDER — PREDNISONE (PAK) 10 MG PO TABS: ORAL_TABLET | ORAL | Status: DC

## 2012-12-21 NOTE — Progress Notes (Signed)
Patient ID: [name], female   DOB: [birth year], 79 y.o.   MRN: [id number]   Patient Active Problem List   Diagnosis Date Noted  . Urinary frequency 05/29/2012  . Venous ulcer of left lower extremity without varicose veins 05/29/2012  . Nocturnal leg cramps 12/15/2011  . Screening for colon cancer 10/27/2011  . Screening for breast cancer 10/27/2011  . Lumbago with sciatica of left side 09/01/2011  . Insomnia 01/07/2011  . Hyperlipidemia 11/12/2010  . Other and unspecified hyperlipidemia 11/08/2010  . Back pain, sacroiliac 11/08/2010  . Tremor, coarse 09/21/2010  . Chronic orthostatic hypotension   . Anxiety     Subjective:  CC:   Chief Complaint  Patient presents with  . Follow-up    HPI:   Gwendolyn Hubbardis a 79 y.o. female who presents Follow up on chronic anxiety.,hypotension  Insomnia and hyperlipiidemia .  She continues to take her medications as directed for management of anxiety and insomnia. She has no history of medication noncompliance or overdose. Her husband continues to be a major source of her anxiety given his continued decline secondary spinal stenosis and dementia.  Her Back pain has been acting up,  and she has had no significant improv trial of ibuprofen. She has not been doing any outside labor which has historically been the source of her episodes of pain. She has not seen his chiropractor in several months and plans to go see her soon    Past Medical History  Diagnosis Date  . Chronic orthostatic hypotension June 2012    with pprior syncopal event,  did not tolelrate florinef trial  . Anxiety   . Hyperlipidemia     History reviewed. No pertinent past surgical history.     The following portions of the patient's history were reviewed and updated as appropriate: Allergies, current medications, and problem list.    Review of Systems:   Patient denies headache, fevers, malaise, unintentional weight loss, skin rash, eye pain, sinus congestion  and sinus pain, sore throat, dysphagia,  hemoptysis , cough, dyspnea, wheezing, chest pain, palpitations, orthopnea, edema, abdominal pain, nausea, melena, diarrhea, constipation, flank pain, dysuria, hematuria, urinary  Frequency, nocturia, numbness, tingling, seizures,  Focal weakness, Loss of consciousness,  Tremor, insomnia, depression, anxiety, and suicidal ideation.       History   Social History  . Marital Status: Married    Spouse Name: N/A    Number of Children: N/A  . Years of Education: N/A   Occupational History  . Not on file.   Social History Main Topics  . Smoking status: Never Smoker   . Smokeless tobacco: Never Used  . Alcohol Use: No  . Drug Use: No  . Sexual Activity: Not Currently   Other Topics Concern  . Not on file   Social History Narrative  . No narrative on file    Objective:  Filed Vitals:   12/21/12 1044  BP: 126/80  Pulse: 86  Temp: 97.5 F (36.4 C)     General appearance: alert, cooperative and appears stated age Ears: normal TM's and external ear canals both ears Throat: lips, mucosa, and tongue normal; teeth and gums normal Neck: no adenopathy, no carotid bruit, supple, symmetrical, trachea midline and thyroid not enlarged, symmetric, no tenderness/mass/nodules Back: symmetric, no curvature. ROM normal. No CVA tenderness. Lungs: clear to auscultation bilaterally Heart: regular rate and rhythm, S1, S2 normal, no murmur, click, rub or gallop Abdomen: soft, non-tender; bowel sounds normal; no masses,  no organomegaly Pulses: 2+  and symmetric Skin: Skin color, texture, turgor normal. No rashes or lesions Lymph nodes: Cervical, supraclavicular, and axillary nodes normal.  Assessment and Plan:  Back pain, sacroiliac Secondary to known spinal stenosis complicated by scoliosis. Pain is currently nonradiating. It has not responded to ibuprofen. Tramadol and prednisone  Anxiety With insomnia. Patient currently is managing her symptoms  with hydroxyzine and lorazepam. Refills given.  Hyperlipidemia Well controlled on current regimen.  Liver enzymes normal. no changes today. Lab Results  Component Value Date   ALT 13 12/21/2012   AST 21 12/21/2012   ALKPHOS 47 12/21/2012   BILITOT 0.8 12/21/2012   Lab Results  Component Value Date   CHOL 214* 12/21/2012   HDL 55.90 12/21/2012   LDLCALC 115* 10/26/2011   LDLDIRECT 145.6 12/21/2012   TRIG 121.0 12/21/2012   CHOLHDL 4 12/21/2012    Chronic orthostatic hypotension Currently quiescent. She has had no falls or dizzy spells.   Updated Medication List Outpatient Encounter Prescriptions as of 12/21/2012  Medication Sig  . Black Cohosh (BLACK COHOSH HOT FLASH RELIEF) 40 MG CAPS Take 1 capsule by mouth daily.  . clorazepate (TRANXENE) 7.5 MG tablet Take 1 tablet (7.5 mg total) by mouth 3 (three) times daily as needed for anxiety.  [name] HYDROcodone-acetaminophen (NORCO/VICODIN) 5-325 MG per tablet Take 1 tablet by mouth every 8 (eight) hours as needed.  . hydrOXYzine (ATARAX/VISTARIL) 25 MG tablet Take 25 mg by mouth every 6 (six) hours as needed.   . Iron-Vitamins (GERITOL PO) Take by mouth.    . Lutein 20 MG CAPS Take 20 mg by mouth daily.    . magnesium oxide (MAG-OX) 400 MG tablet Take 400 mg by mouth daily.   [name] POTASSIUM GLUCONATE PO Take by mouth.    . pravastatin (PRAVACHOL) 20 MG tablet Take 1 tablet (20 mg total) by mouth daily.  . vitamin B-12 (CYANOCOBALAMIN) 1000 MCG tablet Take 1,000 mcg by mouth daily.    . vitamin E 400 UNIT capsule Take 400 Units by mouth daily.    . [DISCONTINUED] HYDROcodone-acetaminophen (NORCO/VICODIN) 5-325 MG per tablet Take 1 tablet by mouth every 8 (eight) hours as needed for pain.  [name] aspirin 81 MG tablet Take 81 mg by mouth daily.    . furosemide (LASIX) 20 MG tablet Take 1 tablet (20 mg total) by mouth daily.  . hydrOXYzine (VISTARIL) 25 MG capsule Take 1 capsule (25 mg total) by mouth 3 (three) times daily as needed.  .  predniSONE (STERAPRED UNI-PAK) 10 MG tablet 6 tablets on Day 1 , then reduce by 1 tablet daily until gone  . traMADol (ULTRAM) 50 MG tablet Take 1 tablet (50 mg total) by mouth every 8 (eight) hours as needed for pain.

## 2012-12-21 NOTE — Progress Notes (Signed)
Pre visit review using our clinic review tool, if applicable. No additional management support is needed unless otherwise documented below in the visit note. 

## 2012-12-21 NOTE — Patient Instructions (Addendum)
I am putting you on a 6 day course of prednisone for your back pain.  DO NOT take Aleve or Motrin with it Take all 6 tablets on Day 1at the same time Take all 5 tablets on Day 2 at the same time' Etc on Days 3 through 6   You may take up to 2 vicodin (hydrocodone) daily if needed for pain control   We will call you with the blood test results

## 2012-12-22 LAB — LIPID PANEL
Cholesterol: 214 mg/dL — ABNORMAL HIGH (ref 0–200)
HDL: 55.9 mg/dL (ref >39.00)
Total CHOL/HDL Ratio: 4
Triglycerides: 121 mg/dL (ref 0.0–149.0)
VLDL: 24.2 mg/dL (ref 0.0–40.0)

## 2012-12-22 LAB — VITAMIN D 25 HYDROXY (VIT D DEFICIENCY, FRACTURES): Vit D, 25-Hydroxy: 48 ng/mL (ref 30–89)

## 2012-12-22 LAB — LDL CHOLESTEROL, DIRECT: Direct LDL: 145.6 mg/dL

## 2012-12-23 ENCOUNTER — Encounter: Payer: Self-pay | Admitting: Internal Medicine

## 2012-12-23 NOTE — Assessment & Plan Note (Signed)
Currently quiescent. She has had no falls or dizzy spells.

## 2012-12-23 NOTE — Assessment & Plan Note (Signed)
With insomnia. Patient currently is managing her symptoms with hydroxyzine and lorazepam. Refills given.

## 2012-12-23 NOTE — Assessment & Plan Note (Addendum)
Not Well controlled on current regimen.  Liver enzymes normal. Increased pravastatin to 40 mg daily  Lab Results  Component Value Date   ALT 13 12/21/2012   AST 21 12/21/2012   ALKPHOS 47 12/21/2012   BILITOT 0.8 12/21/2012   Lab Results  Component Value Date   CHOL 214* 12/21/2012   HDL 55.90 12/21/2012   LDLCALC 115* 10/26/2011   LDLDIRECT 145.6 12/21/2012   TRIG 121.0 12/21/2012   CHOLHDL 4 12/21/2012

## 2012-12-23 NOTE — Assessment & Plan Note (Signed)
Secondary to known spinal stenosis complicated by scoliosis. Pain is currently nonradiating. It has not responded to ibuprofen. Tramadol and prednisone

## 2012-12-26 MED ORDER — PRAVASTATIN SODIUM 40 MG PO TABS: 40.0000 mg | ORAL_TABLET | Freq: Every day | ORAL | Status: DC

## 2012-12-26 NOTE — Addendum Note (Signed)
Addended by: Sherlene Shams on: 12/26/2012 07:48 AM   Modules accepted: Orders

## 2013-01-20 ENCOUNTER — Other Ambulatory Visit: Payer: Self-pay | Admitting: *Deleted

## 2013-01-20 NOTE — Telephone Encounter (Signed)
Patient called and stated her co pay is to high for this medication is there an alternative.

## 2013-01-20 NOTE — Telephone Encounter (Signed)
Last visit 12/21/12, ok refill?

## 2013-01-26 ENCOUNTER — Other Ambulatory Visit: Payer: Self-pay | Admitting: *Deleted

## 2013-01-27 NOTE — Telephone Encounter (Signed)
Patient stated will you refill this time and she can come in and talk with you later? Please advise.

## 2013-01-27 NOTE — Telephone Encounter (Signed)
No without an office visit. Too many alternatives to discuss because we have tried other things

## 2013-01-30 ENCOUNTER — Telehealth: Payer: Self-pay | Admitting: Internal Medicine

## 2013-01-30 MED ORDER — CLORAZEPATE DIPOTASSIUM 7.5 MG PO TABS: 7.5000 mg | ORAL_TABLET | Freq: Three times a day (TID) | ORAL | Status: DC | PRN

## 2013-01-30 NOTE — Telephone Encounter (Signed)
Pt calling to check status of refill.  States she was last here in December and there have been no changes.  Pt will be home the rest of the day.  Asking for a call.

## 2013-01-30 NOTE — Telephone Encounter (Signed)
Please advise 

## 2013-01-30 NOTE — Telephone Encounter (Signed)
See prior messages.,   Tranxene refilled.  I will not change medications without an office visit.

## 2013-01-30 NOTE — Telephone Encounter (Signed)
Pt asking if Dr. Derrel Nip will refill her medication.  Called to make an appt.  Next available not until Friday.  Pt states she cannot wait that long.  Pt did not want to make an appt Friday and did not want to see anyone else but states she needs her sleep meds.  It is in Tier 4 and she is looking for something cheaper.  Asking to please go ahead and refill until she can come in.  States to not leave a msg at her home if she is not available because her husband does not understand.  If she is unable to speak with you when you call, she will just call back when she gets back home.

## 2013-01-30 NOTE — Telephone Encounter (Signed)
Refill on clorazepate authorized and rx printed

## 2013-01-30 NOTE — Telephone Encounter (Signed)
Pt called to check status of medication refill.

## 2013-01-31 NOTE — Telephone Encounter (Signed)
Pt aware.

## 2013-02-10 ENCOUNTER — Encounter: Payer: Self-pay | Admitting: Internal Medicine

## 2013-02-10 ENCOUNTER — Ambulatory Visit (INDEPENDENT_AMBULATORY_CARE_PROVIDER_SITE_OTHER): Payer: Medicare Other | Admitting: Internal Medicine

## 2013-02-10 VITALS — BP 142/70 | HR 65 | Temp 97.7°F | Resp 16 | Wt 131.8 lb

## 2013-02-10 DIAGNOSIS — Z79899 Other long term (current) drug therapy: Secondary | ICD-10-CM

## 2013-02-10 DIAGNOSIS — F411 Generalized anxiety disorder: Secondary | ICD-10-CM

## 2013-02-10 DIAGNOSIS — E785 Hyperlipidemia, unspecified: Secondary | ICD-10-CM

## 2013-02-10 DIAGNOSIS — F419 Anxiety disorder, unspecified: Secondary | ICD-10-CM

## 2013-02-10 DIAGNOSIS — M5137 Other intervertebral disc degeneration, lumbosacral region: Secondary | ICD-10-CM

## 2013-02-10 DIAGNOSIS — G4700 Insomnia, unspecified: Secondary | ICD-10-CM

## 2013-02-10 MED ORDER — GABAPENTIN 100 MG PO CAPS: 100.0000 mg | ORAL_CAPSULE | Freq: Three times a day (TID) | ORAL | Status: DC

## 2013-02-10 MED ORDER — BUSPIRONE HCL 5 MG PO TABS: ORAL_TABLET | ORAL | Status: DC

## 2013-02-10 NOTE — Progress Notes (Signed)
Pre-visit discussion using our clinic review tool. No additional management support is needed unless otherwise documented below in the visit note.  

## 2013-02-10 NOTE — Progress Notes (Signed)
Patient ID: [name], female   DOB: November 12, 1926, 80 y.o.   MRN: [id number]    Patient Active Problem List   Diagnosis Date Noted  . Urinary frequency 05/29/2012  . Venous ulcer of left lower extremity without varicose veins 05/29/2012  . Nocturnal leg cramps 12/15/2011  . Screening for colon cancer 10/27/2011  . Screening for breast cancer 10/27/2011  . Degeneration of lumbar or lumbosacral intervertebral disc 09/01/2011  . Insomnia 01/07/2011  . Hyperlipidemia 11/12/2010  . Other and unspecified hyperlipidemia 11/08/2010  . Back pain, sacroiliac 11/08/2010  . Tremor, coarse 09/21/2010  . Chronic orthostatic hypotension   . Anxiety     Subjective:  CC:   Chief Complaint  Patient presents with  . Follow-up    medication issues    HPI:   [name] is a 80 y.o. female who presents for  Follow up on chronic issues including, hyperlipidemia, chronic anxiety  and insomnia.  She denies anxiety but states that she needs something for her "nerves," for when I feel shaky.  I do not have anxiety. " She has been using  using clorazepate and hydroxyzine at night and still notping through the night several nights per week well.  She reports falling asleep around 9 and waking up at 4 am  Unable to return to sleep,  She request that I "give her something for her nerves,and also states that  "I need Something to help me sleep and something for my back".She is  requesting a prescription for gabapentin ,  Because she tried her husband's  medication  one night for her back and it seemed to help relieve her back pain.   She is concerned about her husband's worsening dementia and persistent leg pain and difficulty walking,  He has found himself another physician who seems to be helping his pain with gabapentin,  Which we had tried several years ago.    Past Medical History  Diagnosis Date  . Chronic orthostatic hypotension June 2012    with pprior syncopal event,  did not tolelrate florinef  trial  . Anxiety   . Hyperlipidemia     History reviewed. No pertinent past surgical history.     The following portions of the patient's history were reviewed and updated as appropriate: Allergies, current medications, and problem list.    Review of Systems:   Patient denies headache, fevers, malaise, unintentional weight loss, skin rash, eye pain, sinus congestion and sinus pain, sore throat, dysphagia,  hemoptysis , cough, dyspnea, wheezing, chest pain, palpitations, orthopnea, edema, abdominal pain, nausea, melena, diarrhea, constipation, flank pain, dysuria, hematuria, urinary  Frequency, nocturia, numbness, tingling, seizures,  Focal weakness, Loss of consciousness,  Tremor, insomnia, depression, anxiety, and suicidal ideation.     History   Social History  . Marital Status: Married    Spouse Name: N/A    Number of Children: N/A  . Years of Education: N/A   Occupational History  . Not on file.   Social History Main Topics  . Smoking status: Never Smoker   . Smokeless tobacco: Never Used  . Alcohol Use: No  . Drug Use: No  . Sexual Activity: Not Currently   Other Topics Concern  . Not on file   Social History Narrative  . No narrative on file    Objective:  Filed Vitals:   02/10/13 0854  BP: 142/70  Pulse: 65  Temp: 97.7 F (36.5 C)  Resp: 16     General appearance: alert, cooperative and  appears stated age Ears: normal TM's and external ear canals both ears Throat: lips, mucosa, and tongue normal; teeth and gums normal Neck: no adenopathy, no carotid bruit, supple, symmetrical, trachea midline and thyroid not enlarged, symmetric, no tenderness/mass/nodules Back: symmetric, no curvature. ROM normal. No CVA tenderness. Lungs: clear to auscultation bilaterally Heart: regular rate and rhythm, S1, S2 normal, no murmur, click, rub or gallop Abdomen: soft, non-tender; bowel sounds normal; no masses,  no organomegaly Pulses: 2+ and symmetric Skin: Skin  color, texture, turgor normal. No rashes or lesions Lymph nodes: Cervical, supraclavicular, and axillary nodes normal.  Assessment and Plan:  Anxiety Trial of buspirone discussed,  May start with 5 mg bid to tid.  May titrate in 2 weeks if no improvement.   Other and unspecified hyperlipidemia Dose of pravastatin was increased last year for goal LDL 100 and repeat lipids are due.   Insomnia Continue clorazepate for now pending trial of buspirone for anxiety and addition of low dose gabapentin for back pain.   Degeneration of lumbar or lumbosacral intervertebral disc Improved pain control with trial of gabapentin (patient tried husband's medication).  Will rx 100 mg qhs.   A total of 30 minutes was spent with patient more than half of which was spent in counseling, reviewing records from other prviders and coordination of care.  Updated Medication List Outpatient Encounter Prescriptions as of 02/10/2013  Medication Sig  . aspirin 81 MG tablet Take 81 mg by mouth daily.    . Black Cohosh (BLACK COHOSH HOT FLASH RELIEF) 40 MG CAPS Take 1 capsule by mouth daily.  . clorazepate (TRANXENE) 7.5 MG tablet Take 1 tablet (7.5 mg total) by mouth 3 (three) times daily as needed for anxiety.  . hydrOXYzine (VISTARIL) 25 MG capsule Take 1 capsule (25 mg total) by mouth 3 (three) times daily as needed.  . Iron-Vitamins (GERITOL PO) Take by mouth.    . Lutein 20 MG CAPS Take 20 mg by mouth daily.    . magnesium oxide (MAG-OX) 400 MG tablet Take 400 mg by mouth daily.   [name] POTASSIUM GLUCONATE PO Take by mouth.    . pravastatin (PRAVACHOL) 40 MG tablet Take 1 tablet (40 mg total) by mouth daily.  . vitamin B-12 (CYANOCOBALAMIN) 1000 MCG tablet Take 1,000 mcg by mouth daily.    . vitamin E 400 UNIT capsule Take 400 Units by mouth daily.    . busPIRone (BUSPAR) 5 MG tablet 1 tablet 2 to 3 times daily for anxiety  . gabapentin (NEURONTIN) 100 MG capsule Take 1 capsule (100 mg total) by mouth 3 (three)  times daily.  . [DISCONTINUED] furosemide (LASIX) 20 MG tablet Take 1 tablet (20 mg total) by mouth daily.  . [DISCONTINUED] HYDROcodone-acetaminophen (NORCO/VICODIN) 5-325 MG per tablet Take 1 tablet by mouth every 8 (eight) hours as needed.  . [DISCONTINUED] hydrOXYzine (ATARAX/VISTARIL) 25 MG tablet Take 25 mg by mouth every 6 (six) hours as needed.   . [DISCONTINUED] predniSONE (STERAPRED UNI-PAK) 10 MG tablet 6 tablets on Day 1 , then reduce by 1 tablet daily until gone  . [DISCONTINUED] traMADol (ULTRAM) 50 MG tablet Take 1 tablet (50 mg total) by mouth every 8 (eight) hours as needed for pain.     No orders of the defined types were placed in this encounter.    No Follow-up on file.

## 2013-02-10 NOTE — Patient Instructions (Addendum)
I am starting you on buspirone for your nerves,  Start with two daily doses of 5 mg ,  You may add a 3rd dose late evening if it helps you relax and fall asleep.  We can increase the dose gradually up to 30 mg if needed    You can continue to use the clorazepate at bedtime if needed if it is affordable   Please return for fasting labs next week at your convenience so we can check your cholesterol

## 2013-02-11 ENCOUNTER — Encounter: Payer: Self-pay | Admitting: Internal Medicine

## 2013-02-11 NOTE — Assessment & Plan Note (Signed)
Trial of buspirone discussed,  May start with 5 mg bid to tid.  May titrate in 2 weeks if no improvement.

## 2013-02-11 NOTE — Assessment & Plan Note (Signed)
Improved pain control with trial of gabapentin (patient tried husband's medication).  Will rx 100 mg qhs.

## 2013-02-11 NOTE — Addendum Note (Signed)
Addended by: Crecencio Mc on: 02/11/2013 06:40 PM   Modules accepted: Orders

## 2013-02-11 NOTE — Assessment & Plan Note (Signed)
Dose of pravastatin was increased last year for goal LDL 100 and repeat lipids are due.

## 2013-02-11 NOTE — Assessment & Plan Note (Addendum)
Continue clorazepate for now pending trial of buspirone for anxiety and addition of low dose gabapentin for back pain.

## 2013-03-10 ENCOUNTER — Telehealth: Payer: Self-pay | Admitting: Internal Medicine

## 2013-03-10 NOTE — Telephone Encounter (Signed)
She can use the tranxene (clorazepate)  That she already has a prescription for every 8 hours.  I cannot prescribe anything else but she can increase the buspirone to 10 mg three times daily as well

## 2013-03-10 NOTE — Telephone Encounter (Signed)
Tried to reach patient no answer no voicemail. 

## 2013-03-10 NOTE — Telephone Encounter (Signed)
The patient is at the hospital she just received information that her husband is passing away .  She has Buspirone 5 mg . She is wanting to know if she can increase that medication or can the physician call something to the pharmacy. Please call the patient back at the hospital the number is [phone].

## 2013-03-10 NOTE — Telephone Encounter (Signed)
Please advise. Patient has taken three Buspirone today, and stated she feels this is not helping to make her as calm as she needs to be and wanted know if there is something else she can take or can she increase dose please advise.

## 2013-03-14 NOTE — Telephone Encounter (Signed)
Busy signal cannot each patient  No voicemail.

## 2013-03-15 NOTE — Telephone Encounter (Signed)
Finally able to reach patient to day and gave patient directions for medication patient wanted you to know her husband passed away on 04/22/2022.

## 2013-03-15 NOTE — Telephone Encounter (Signed)
He was in the hospital on [facility]. Ask if if she needs anything to let us know

## 2013-03-28 ENCOUNTER — Telehealth: Payer: Self-pay | Admitting: Internal Medicine

## 2013-03-28 NOTE — Telephone Encounter (Signed)
Appointment made and pt is aware.

## 2013-03-28 NOTE — Telephone Encounter (Signed)
Please advise you do not have anything this week.

## 2013-03-28 NOTE — Telephone Encounter (Signed)
Have [name] come in t 1;30 pm this Thursday.  Larene [place] is supposed to open my schedyule for this thursday

## 2013-03-28 NOTE — Telephone Encounter (Signed)
The patient is needing to see Dr. Derrel Nip this week . She is still having trouble sleeping and wants to talk to Dr. Derrel Nip to see what can be done.

## 2013-03-30 ENCOUNTER — Ambulatory Visit (INDEPENDENT_AMBULATORY_CARE_PROVIDER_SITE_OTHER): Payer: Medicare Other | Admitting: Internal Medicine

## 2013-03-30 ENCOUNTER — Encounter: Payer: Self-pay | Admitting: Internal Medicine

## 2013-03-30 VITALS — BP 128/68 | HR 77 | Temp 98.3°F | Resp 16 | Wt 130.8 lb

## 2013-03-30 DIAGNOSIS — F4321 Adjustment disorder with depressed mood: Secondary | ICD-10-CM

## 2013-03-30 DIAGNOSIS — F411 Generalized anxiety disorder: Secondary | ICD-10-CM

## 2013-03-30 DIAGNOSIS — F419 Anxiety disorder, unspecified: Secondary | ICD-10-CM

## 2013-03-30 DIAGNOSIS — M5137 Other intervertebral disc degeneration, lumbosacral region: Secondary | ICD-10-CM

## 2013-03-30 DIAGNOSIS — G4700 Insomnia, unspecified: Secondary | ICD-10-CM

## 2013-03-30 MED ORDER — BUSPIRONE HCL 10 MG PO TABS: ORAL_TABLET | ORAL | Status: DC

## 2013-03-30 MED ORDER — CLORAZEPATE DIPOTASSIUM 7.5 MG PO TABS: 7.5000 mg | ORAL_TABLET | Freq: Three times a day (TID) | ORAL | Status: DC | PRN

## 2013-03-30 NOTE — Progress Notes (Signed)
Patient ID: [name], female   DOB: 1926-06-13, 80 y.o.   MRN: [id number]  Patient Active Problem List   Diagnosis Date Noted  . Grief 04/02/2013  . Urinary frequency 05/29/2012  . Venous ulcer of left lower extremity without varicose veins 05/29/2012  . Nocturnal leg cramps 12/15/2011  . Screening for colon cancer 10/27/2011  . Screening for breast cancer 10/27/2011  . Degeneration of lumbar or lumbosacral intervertebral disc 09/01/2011  . Insomnia 01/07/2011  . Hyperlipidemia 11/12/2010  . Other and unspecified hyperlipidemia 11/08/2010  . Back pain, sacroiliac 11/08/2010  . Tremor, coarse 09/21/2010  . Chronic orthostatic hypotension   . Anxiety     Subjective:  CC:   Chief Complaint  Patient presents with  . Follow-up    Can't sleep at night    HPI:   [name] is a 80 y.o. female who presents for Grief, insomnia and anxiety.  [name] lost her husband last Monday after he was hospitalized with a fractured leg from a fall at home.  His pain became increasingly difficult to control and he developed acute renal failure.  He was receiving palliation and passsed in the hospital. Since his death she has had increased anxeity and difficulty initiating and maintaining sleep.  She has been using tranxenee ambien and hydroxyzine without improvement.    Past Medical History  Diagnosis Date  . Chronic orthostatic hypotension June 2012    with pprior syncopal event,  did not tolelrate florinef trial  . Anxiety   . Hyperlipidemia     History reviewed. No pertinent past surgical history.     The following portions of the patient's history were reviewed and updated as appropriate: Allergies, current medications, and problem list.    Review of Systems:   Patient denies headache, fevers, malaise, unintentional weight loss, skin rash, eye pain, sinus congestion and sinus pain, sore throat, dysphagia,  hemoptysis , cough, dyspnea, wheezing, chest pain, palpitations,  orthopnea, edema, abdominal pain, nausea, melena, diarrhea, constipation, flank pain, dysuria, hematuria, urinary  Frequency, nocturia, numbness, tingling, seizures,  Focal weakness, Loss of consciousness,  Tremor, insomnia, depression, anxiety, and suicidal ideation.     History   Social History  . Marital Status: Married    Spouse Name: N/A    Number of Children: N/A  . Years of Education: N/A   Occupational History  . Not on file.   Social History Main Topics  . Smoking status: Never Smoker   . Smokeless tobacco: Never Used  . Alcohol Use: No  . Drug Use: No  . Sexual Activity: Not Currently   Other Topics Concern  . Not on file   Social History Narrative  . No narrative on file    Objective:  Filed Vitals:   03/30/13 1331  BP: 128/68  Pulse: 77  Temp: 98.3 F (36.8 C)  Resp: 16     General appearance: alert, cooperative and appears stated age Ears: normal TM's and external ear canals both ears Throat: lips, mucosa, and tongue normal; teeth and gums normal Neck: no adenopathy, no carotid bruit, supple, symmetrical, trachea midline and thyroid not enlarged, symmetric, no tenderness/mass/nodules Back: symmetric, no curvature. ROM normal. No CVA tenderness. Lungs: clear to auscultation bilaterally Heart: regular rate and rhythm, S1, S2 normal, no murmur, click, rub or gallop Abdomen: soft, non-tender; bowel sounds normal; no masses,  no organomegaly Pulses: 2+ and symmetric Skin: Skin color, texture, turgor normal. No rashes or lesions Lymph nodes: Cervical, supraclavicular, and axillary nodes normal.  Assessment and Plan:  Insomnia Recommended using tranzene 7. 5 to 15 mg at bedtime for insomnia.   Anxiety Increasing buspirone to 10 mg bid   Degeneration of lumbar or lumbosacral intervertebral disc Adding gabapentin at bedtime.   Grief Long discussion with patient today about her coping methods and management.  She hs already started maging the  administrative tasks, and appears to be functioning well.    Updated Medication List Outpatient Encounter Prescriptions as of 03/30/2013  Medication Sig  . aspirin 81 MG tablet Take 81 mg by mouth daily.    . Black Cohosh (BLACK COHOSH HOT FLASH RELIEF) 40 MG CAPS Take 1 capsule by mouth daily.  . busPIRone (BUSPAR) 10 MG tablet 1 tablet 2 to 3 times daily for anxiety  . clorazepate (TRANXENE) 7.5 MG tablet Take 1 tablet (7.5 mg total) by mouth 3 (three) times daily as needed for anxiety.  . gabapentin (NEURONTIN) 100 MG capsule Take 1 capsule (100 mg total) by mouth 3 (three) times daily.  . hydrOXYzine (VISTARIL) 25 MG capsule Take 1 capsule (25 mg total) by mouth 3 (three) times daily as needed.  . Iron-Vitamins (GERITOL PO) Take by mouth.    . Lutein 20 MG CAPS Take 20 mg by mouth daily.    . magnesium oxide (MAG-OX) 400 MG tablet Take 400 mg by mouth daily.   [name] POTASSIUM GLUCONATE PO Take by mouth.    . pravastatin (PRAVACHOL) 40 MG tablet Take 1 tablet (40 mg total) by mouth daily.  . vitamin B-12 (CYANOCOBALAMIN) 1000 MCG tablet Take 1,000 mcg by mouth daily.    . vitamin E 400 UNIT capsule Take 400 Units by mouth daily.    . [DISCONTINUED] busPIRone (BUSPAR) 5 MG tablet 1 tablet 2 to 3 times daily for anxiety  . [DISCONTINUED] clorazepate (TRANXENE) 7.5 MG tablet Take 1 tablet (7.5 mg total) by mouth 3 (three) times daily as needed for anxiety.  . [DISCONTINUED] clorazepate (TRANXENE) 7.5 MG tablet Take 1 tablet (7.5 mg total) by mouth 3 (three) times daily as needed for anxiety.     No orders of the defined types were placed in this encounter.    Return in about 4 weeks (around 04/27/2013).

## 2013-03-30 NOTE — Patient Instructions (Addendum)
For your anxiety and insomnia:  Increase the buspirone to 10 mg twice daily. Use this during the day and save the clorazepate for bedtime   You may take 1 gabapentin and 2 clorazepate at bedtime if needed   Save the hydroxyzine to add on if needed , to the above regimen

## 2013-04-02 ENCOUNTER — Encounter: Payer: Self-pay | Admitting: Internal Medicine

## 2013-04-02 DIAGNOSIS — F4321 Adjustment disorder with depressed mood: Secondary | ICD-10-CM | POA: Insufficient documentation

## 2013-04-02 NOTE — Assessment & Plan Note (Addendum)
Increasing buspirone to 10 mg bid

## 2013-04-02 NOTE — Assessment & Plan Note (Signed)
Recommended using tranzene 7. 5 to 15 mg at bedtime for insomnia.

## 2013-04-02 NOTE — Assessment & Plan Note (Signed)
Long discussion with patient today about her coping methods and management.  She hs already started maging the administrative tasks, and appears to be functioning well.

## 2013-04-02 NOTE — Assessment & Plan Note (Signed)
Adding gabapentin at bedtime.

## 2013-04-28 ENCOUNTER — Encounter: Payer: Self-pay | Admitting: Internal Medicine

## 2013-04-28 ENCOUNTER — Ambulatory Visit (INDEPENDENT_AMBULATORY_CARE_PROVIDER_SITE_OTHER): Payer: Medicare Other | Admitting: Internal Medicine

## 2013-04-28 VITALS — BP 118/64 | HR 68 | Temp 97.5°F | Resp 16 | Wt 131.5 lb

## 2013-04-28 DIAGNOSIS — G4700 Insomnia, unspecified: Secondary | ICD-10-CM

## 2013-04-28 DIAGNOSIS — M5137 Other intervertebral disc degeneration, lumbosacral region: Secondary | ICD-10-CM

## 2013-04-28 DIAGNOSIS — F4321 Adjustment disorder with depressed mood: Secondary | ICD-10-CM

## 2013-04-28 MED ORDER — HYDROXYZINE PAMOATE 25 MG PO CAPS: 25.0000 mg | ORAL_CAPSULE | Freq: Three times a day (TID) | ORAL | Status: DC | PRN

## 2013-04-28 NOTE — Progress Notes (Signed)
Pre visit review using our clinic review tool, if applicable. No additional management support is needed unless otherwise documented below in the visit note. 

## 2013-04-28 NOTE — Progress Notes (Signed)
Patient ID: [name], female   DOB: Jan 01, 1927, 80 y.o.   MRN: [id number]   Patient Active Problem List   Diagnosis Date Noted  . Grief 04/02/2013  . Urinary frequency 05/29/2012  . Venous ulcer of left lower extremity without varicose veins 05/29/2012  . Nocturnal leg cramps 12/15/2011  . Screening for colon cancer 10/27/2011  . Screening for breast cancer 10/27/2011  . Degeneration of lumbar or lumbosacral intervertebral disc 09/01/2011  . Insomnia 01/07/2011  . Hyperlipidemia 11/12/2010  . Other and unspecified hyperlipidemia 11/08/2010  . Back pain, sacroiliac 11/08/2010  . Tremor, coarse 09/21/2010  . Chronic orthostatic hypotension   . Anxiety     Subjective:  CC:   Chief Complaint  Patient presents with  . Follow-up    HPI:   [name] is a 80 y.o. female who presents for Follow up on insomnia , aggravated by grief and anxiety following the death of her husband of 19 years.   She did not try the regimen I prescribed last month.   She started a trial of bedtime  Gabapentin for back pain with radiculopathy which  is helping , and has been taking 1-2 at bedtime,  along with hydroxyzine (using 2 daily and) 25 mg at bedtime with a clorazepate 7.5 mg at bedtime.  This combination has been effective, and she is able to wake up and void when necessary. She has been updating her house room by room which has helped take her mind off of her loss and solitude since her husband's death.       Past Medical History  Diagnosis Date  . Chronic orthostatic hypotension June 2012    with pprior syncopal event,  did not tolelrate florinef trial  . Anxiety   . Hyperlipidemia     No past surgical history on file.     The following portions of the patient's history were reviewed and updated as appropriate: Allergies, current medications, and problem list.    Review of Systems:   Patient denies headache, fevers, malaise, unintentional weight loss, skin rash, eye pain,  sinus congestion and sinus pain, sore throat, dysphagia,  hemoptysis , cough, dyspnea, wheezing, chest pain, palpitations, orthopnea, edema, abdominal pain, nausea, melena, diarrhea, constipation, flank pain, dysuria, hematuria, urinary  Frequency, nocturia, numbness, tingling, seizures,  Focal weakness, Loss of consciousness,  Tremor, insomnia, depression, anxiety, and suicidal ideation.     History   Social History  . Marital Status: Married    Spouse Name: N/A    Number of Children: N/A  . Years of Education: N/A   Occupational History  . Not on file.   Social History Main Topics  . Smoking status: Never Smoker   . Smokeless tobacco: Never Used  . Alcohol Use: No  . Drug Use: No  . Sexual Activity: Not Currently   Other Topics Concern  . Not on file   Social History Narrative  . No narrative on file    Objective:  Filed Vitals:   04/28/13 1410  BP: 118/64  Pulse: 68  Temp: 97.5 F (36.4 C)  Resp: 16     General appearance: alert, cooperative and appears stated age Ears: normal TM's and external ear canals both ears Throat: lips, mucosa, and tongue normal; teeth and gums normal Neck: no adenopathy, no carotid bruit, supple, symmetrical, trachea midline and thyroid not enlarged, symmetric, no tenderness/mass/nodules Back: symmetric, no curvature. ROM normal. No CVA tenderness. Lungs: clear to auscultation bilaterally Heart: regular rate and rhythm, S1,  S2 normal, no murmur, click, rub or gallop Abdomen: soft, non-tender; bowel sounds normal; no masses,  no organomegaly Pulses: 2+ and symmetric Skin: Skin color, texture, turgor normal. No rashes or lesions Lymph nodes: Cervical, supraclavicular, and axillary nodes normal.  Assessment and Plan:  Degeneration of lumbar or lumbosacral intervertebral disc Pain has improved with addition of gabapentin.   Grief Slowly improving.   Insomnia Chronic , more problematic since the death of her husband.   A total of  25 minutes was spent with patient more than half of which was spent in counseling, reviewing records from other prviders and coordination of care.   Updated Medication List Outpatient Encounter Prescriptions as of 04/28/2013  Medication Sig  . aspirin 81 MG tablet Take 81 mg by mouth daily.    . Black Cohosh (BLACK COHOSH HOT FLASH RELIEF) 40 MG CAPS Take 1 capsule by mouth as needed.   . clorazepate (TRANXENE) 7.5 MG tablet Take 7.5 mg by mouth 3 (three) times daily.  [name] gabapentin (NEURONTIN) 100 MG capsule Take 1 capsule (100 mg total) by mouth 3 (three) times daily.  . hydrOXYzine (VISTARIL) 25 MG capsule Take 1 capsule (25 mg total) by mouth 3 (three) times daily as needed.  . Iron-Vitamins (GERITOL PO) Take by mouth.    . Lutein 20 MG CAPS Take 20 mg by mouth daily.    . magnesium oxide (MAG-OX) 400 MG tablet Take 400 mg by mouth daily.   [name] POTASSIUM GLUCONATE PO Take by mouth.    . vitamin B-12 (CYANOCOBALAMIN) 1000 MCG tablet Take 1,000 mcg by mouth daily.    . vitamin E 400 UNIT capsule Take 400 Units by mouth daily.    . [DISCONTINUED] hydrOXYzine (VISTARIL) 25 MG capsule Take 1 capsule (25 mg total) by mouth 3 (three) times daily as needed.  . [DISCONTINUED] busPIRone (BUSPAR) 10 MG tablet 1 tablet 2 to 3 times daily for anxiety  . [DISCONTINUED] clorazepate (TRANXENE) 7.5 MG tablet Take 1 tablet (7.5 mg total) by mouth 3 (three) times daily as needed for anxiety.  . [DISCONTINUED] pravastatin (PRAVACHOL) 40 MG tablet Take 1 tablet (40 mg total) by mouth daily.     No orders of the defined types were placed in this encounter.    No Follow-up on file.

## 2013-04-28 NOTE — Patient Instructions (Signed)
If your insurance will not cover the hydroxyzine,  You can substitute generic benadryl (dipenhydramine) 25 mg every 8 hours   You can continue the clorazepate at bedtime and the gabapentin if it is helping your back

## 2013-04-30 NOTE — Assessment & Plan Note (Signed)
Chronic , more problematic since the death of her husband.

## 2013-04-30 NOTE — Assessment & Plan Note (Signed)
Pain has improved with addition of gabapentin.

## 2013-04-30 NOTE — Assessment & Plan Note (Signed)
Slowly improving

## 2013-05-09 ENCOUNTER — Telehealth: Payer: Self-pay | Admitting: Internal Medicine

## 2013-05-09 NOTE — Telephone Encounter (Signed)
Pt states she received a bill for 10.00.  States she pays her copay every time she comes to the office and this needs to be taken care of.  States she does not owe 10.00.  Attempted to research but pt states she just wants it taken care of and she will disregard the bill unless she hears from [place].

## 2013-06-13 ENCOUNTER — Ambulatory Visit (INDEPENDENT_AMBULATORY_CARE_PROVIDER_SITE_OTHER): Payer: Medicare Other | Admitting: Adult Health

## 2013-06-13 ENCOUNTER — Encounter: Payer: Self-pay | Admitting: Adult Health

## 2013-06-13 VITALS — BP 120/60 | HR 79 | Temp 99.8°F | Resp 16 | Wt 129.5 lb

## 2013-06-13 DIAGNOSIS — J069 Acute upper respiratory infection, unspecified: Secondary | ICD-10-CM

## 2013-06-13 MED ORDER — MOMETASONE FUROATE 50 MCG/ACT NA SUSP: NASAL | Status: DC

## 2013-06-13 NOTE — Progress Notes (Signed)
Subjective:    Patient ID: [name], female    DOB: 05/19/1926, 80 y.o.   MRN: [id number]  HPI 80 year old caucasian female with history of anxiety and chronic orthostatic hypotension presents today with allergies. Was working in the garden on Thursday and became hoarse on Friday and has slowly improved. She does have a headache, some bloody discharge from her nose, sore throat, coughing and decreased appeptite. Denies any chills, bodyaches, nausea or diarrhea. Has tried diphenhyrdamine and aspirin which has been of little help. She is also taking hydroxyzine.  She is tearful 2/2 husband of 65 years passing away 3 months ago. No children but reports that she has people she can talk to. Also has a church family. Attends church regularly. Staying active in her house. Not going out much.   Past Medical History  Diagnosis Date  . Chronic orthostatic hypotension June 2012    with pprior syncopal event,  did not tolelrate florinef trial  . Anxiety   . Hyperlipidemia     Current Outpatient Prescriptions on File Prior to Visit  Medication Sig Dispense Refill  . aspirin 81 MG tablet Take 81 mg by mouth daily.        . Black Cohosh (BLACK COHOSH HOT FLASH RELIEF) 40 MG CAPS Take 1 capsule by mouth as needed.       . clorazepate (TRANXENE) 7.5 MG tablet Take 7.5 mg by mouth 3 (three) times daily.      [name] gabapentin (NEURONTIN) 100 MG capsule Take 1 capsule (100 mg total) by mouth 3 (three) times daily.  90 capsule  3  . hydrOXYzine (VISTARIL) 25 MG capsule Take 1 capsule (25 mg total) by mouth 3 (three) times daily as needed.  90 capsule  11  . Iron-Vitamins (GERITOL PO) Take by mouth.        . Lutein 20 MG CAPS Take 20 mg by mouth daily.        . magnesium oxide (MAG-OX) 400 MG tablet Take 400 mg by mouth daily.       [name] POTASSIUM GLUCONATE PO Take by mouth.        . vitamin B-12 (CYANOCOBALAMIN) 1000 MCG tablet Take 1,000 mcg by mouth daily.        . vitamin E 400 UNIT capsule Take 400  Units by mouth daily.         No current facility-administered medications on file prior to visit.     Review of Systems  All other systems reviewed and are negative. Except for symptoms reported in HPI     Objective:  BP 120/60  Pulse 79  Temp(Src) 99.8 F (37.7 C) (Oral)  Resp 16  Wt 129 lb 8 oz (58.741 kg)  SpO2 95%   Physical Exam  Constitutional:  80 year old female seated in chair in NAD but appears tearful. Speech is clear and appropriate.  HENT:  Right Ear: Hearing, tympanic membrane, external ear and ear canal normal.  Left Ear: Hearing, tympanic membrane, external ear and ear canal normal.  Mouth/Throat: Uvula is midline and mucous membranes are normal. Posterior oropharyngeal erythema present.  Nose appears reddend  Cardiovascular: Normal rate, regular rhythm, normal heart sounds and intact distal pulses.   Pulmonary/Chest: Effort normal and breath sounds normal.      Assessment & Plan:   1. URI, acute Triggered by allergies vs viral. Start nasonex 2 sprays into each nostril. Tylenol for general discomfort or low grade fever. Continue regular medications. Saline spray  for moistening sinuses. Advised not to take vistaril and benadryl which she had been doing. RTC if not improved within 3-4 days or sooner if necessary.

## 2013-06-13 NOTE — Progress Notes (Signed)
Pre visit review using our clinic review tool, if applicable. No additional management support is needed unless otherwise documented below in the visit note. 

## 2013-06-13 NOTE — Patient Instructions (Signed)
  Start Nasonex 2 sprays into each nostril once daily. This will help for allergies as well as sinus inflammation.  You can also try saline nasal spray. This is several over-the-counter. You can use the spray as often as you like. This will irrigate and moist in your sinuses.  Continue with your usual medication.  Take Tylenol for low-grade fever or general discomfort.  Please call if your symptoms are not improved within 3-4 days or sooner if necessary.

## 2013-06-15 ENCOUNTER — Other Ambulatory Visit: Payer: Self-pay

## 2013-06-15 MED ORDER — FLUTICASONE PROPIONATE 50 MCG/ACT NA SUSP: 2.0000 | Freq: Every day | NASAL | Status: DC

## 2013-07-14 ENCOUNTER — Telehealth: Payer: Self-pay | Admitting: Internal Medicine

## 2013-07-14 NOTE — Telephone Encounter (Signed)
The patient is needing an appointment she is unable to sleep ,depressed over losing her husband. Wanting to discuss medication .

## 2013-07-14 NOTE — Telephone Encounter (Signed)
We need to keep an acute spot open on Monday for something acute.  insomnia is note acute, she has had it for years.  You can give her an 11:30 on Thursday

## 2013-07-14 NOTE — Telephone Encounter (Signed)
Can I put patient in acute spot 6.15 Monday?  Or 08/09/13 at 11.30 AM

## 2013-07-14 NOTE — Telephone Encounter (Signed)
Can we schedule for 11.30 on Thursday?

## 2013-07-17 NOTE — Telephone Encounter (Signed)
The patient is aware of her appointment.

## 2013-07-20 ENCOUNTER — Encounter: Payer: Self-pay | Admitting: Internal Medicine

## 2013-07-20 ENCOUNTER — Ambulatory Visit: Payer: Self-pay | Admitting: Internal Medicine

## 2013-07-20 ENCOUNTER — Telehealth: Payer: Self-pay | Admitting: *Deleted

## 2013-07-20 ENCOUNTER — Ambulatory Visit (INDEPENDENT_AMBULATORY_CARE_PROVIDER_SITE_OTHER): Payer: Medicare Other | Admitting: Internal Medicine

## 2013-07-20 VITALS — BP 120/64 | HR 85 | Temp 98.4°F | Resp 18 | Ht 66.0 in | Wt 124.8 lb

## 2013-07-20 DIAGNOSIS — R5383 Other fatigue: Secondary | ICD-10-CM

## 2013-07-20 DIAGNOSIS — M545 Low back pain, unspecified: Secondary | ICD-10-CM

## 2013-07-20 DIAGNOSIS — E785 Hyperlipidemia, unspecified: Secondary | ICD-10-CM

## 2013-07-20 DIAGNOSIS — R5381 Other malaise: Secondary | ICD-10-CM

## 2013-07-20 DIAGNOSIS — R413 Other amnesia: Secondary | ICD-10-CM

## 2013-07-20 DIAGNOSIS — M546 Pain in thoracic spine: Secondary | ICD-10-CM

## 2013-07-20 DIAGNOSIS — G4700 Insomnia, unspecified: Secondary | ICD-10-CM

## 2013-07-20 DIAGNOSIS — F05 Delirium due to known physiological condition: Secondary | ICD-10-CM

## 2013-07-20 DIAGNOSIS — Z79899 Other long term (current) drug therapy: Secondary | ICD-10-CM

## 2013-07-20 DIAGNOSIS — R350 Frequency of micturition: Secondary | ICD-10-CM

## 2013-07-20 LAB — CBC WITH DIFFERENTIAL/PLATELET
Basophils Absolute: 0.1 10*3/uL (ref 0.0–0.1)
Basophils Relative: 0.5 % (ref 0.0–3.0)
Eosinophils Absolute: 0.1 10*3/uL (ref 0.0–0.7)
Eosinophils Relative: 0.7 % (ref 0.0–5.0)
HCT: 31 % — ABNORMAL LOW (ref 36.0–46.0)
Hemoglobin: 10.2 g/dL — ABNORMAL LOW (ref 12.0–15.0)
Lymphocytes Relative: 12.2 % (ref 12.0–46.0)
Lymphs Abs: 1.5 10*3/uL (ref 0.7–4.0)
MCHC: 32.9 g/dL (ref 30.0–36.0)
MCV: 95 fl (ref 78.0–100.0)
Monocytes Absolute: 0.9 10*3/uL (ref 0.1–1.0)
Monocytes Relative: 7.6 % (ref 3.0–12.0)
Neutro Abs: 9.6 10*3/uL — ABNORMAL HIGH (ref 1.4–7.7)
Neutrophils Relative %: 79 % — ABNORMAL HIGH (ref 43.0–77.0)
Platelets: 383 10*3/uL (ref 150.0–400.0)
RBC: 3.26 Mil/uL — ABNORMAL LOW (ref 3.87–5.11)
RDW: 13.6 % (ref 11.5–15.5)
WBC: 12.1 10*3/uL — ABNORMAL HIGH (ref 4.0–10.5)

## 2013-07-20 LAB — COMPREHENSIVE METABOLIC PANEL
ALT: 12 U/L (ref 0–35)
AST: 19 U/L (ref 0–37)
Albumin: 3.1 g/dL — ABNORMAL LOW (ref 3.5–5.2)
Alkaline Phosphatase: 57 U/L (ref 39–117)
BUN: 13 mg/dL (ref 6–23)
CO2: 29 mEq/L (ref 19–32)
Calcium: 9.2 mg/dL (ref 8.4–10.5)
Chloride: 100 mEq/L (ref 96–112)
Creatinine, Ser: 0.9 mg/dL (ref 0.4–1.2)
GFR: 63.31 mL/min (ref >60.00)
Glucose, Bld: 94 mg/dL (ref 70–99)
Potassium: 4.6 mEq/L (ref 3.5–5.1)
Sodium: 136 mEq/L (ref 135–145)
Total Bilirubin: 0.6 mg/dL (ref 0.2–1.2)
Total Protein: 6.9 g/dL (ref 6.0–8.3)

## 2013-07-20 LAB — LIPID PANEL
Cholesterol: 175 mg/dL (ref 0–200)
HDL: 37.7 mg/dL — ABNORMAL LOW (ref >39.00)
LDL Cholesterol: 122 mg/dL — ABNORMAL HIGH (ref 0–99)
NonHDL: 137.3
Total CHOL/HDL Ratio: 5
Triglycerides: 79 mg/dL (ref 0.0–149.0)
VLDL: 15.8 mg/dL (ref 0.0–40.0)

## 2013-07-20 LAB — MAGNESIUM: Magnesium: 2.1 mg/dL (ref 1.5–2.5)

## 2013-07-20 LAB — VITAMIN B12: Vitamin B-12: >1500 pg/mL — ABNORMAL HIGH (ref 211–911)

## 2013-07-20 LAB — TSH: TSH: 0.98 u[IU]/mL (ref 0.35–4.50)

## 2013-07-20 MED ORDER — MIRTAZAPINE 15 MG PO TABS: 15.0000 mg | ORAL_TABLET | Freq: Every day | ORAL | Status: DC

## 2013-07-20 NOTE — Telephone Encounter (Signed)
fyi patient wasn't able to give a urine sample today

## 2013-07-20 NOTE — Progress Notes (Signed)
Pre-visit discussion using our clinic review tool. No additional management support is needed unless otherwise documented below in the visit note.  

## 2013-07-20 NOTE — Patient Instructions (Addendum)
You are depressed.  You have lost weight and you are more forgetful  I am concerned that your clorazepate is contributing to your lethargy and forgetfulness  I am starting you on mirtazipine at bedtime to help your insomnia, mood and appetite  I also want you get an MRI of your brain to rule out a stroke.   You need  To wear your Medic Alert ALL the time,  DO NOT TAKE IT OFF TO GO OUT!!!

## 2013-07-20 NOTE — Progress Notes (Signed)
Patient ID: [name], female   DOB: [birth year], 80 y.o.   MRN: [id number]    Patient Active Problem List   Diagnosis Date Noted  . Lethargy 07/22/2013  . Grief 04/02/2013  . Urinary frequency 05/29/2012  . Venous ulcer of left lower extremity without varicose veins 05/29/2012  . Nocturnal leg cramps 12/15/2011  . Screening for colon cancer 10/27/2011  . Screening for breast cancer 10/27/2011  . Degeneration of lumbar or lumbosacral intervertebral disc 09/01/2011  . Insomnia 01/07/2011  . Hyperlipidemia 11/12/2010  . Other and unspecified hyperlipidemia 11/08/2010  . Back pain, sacroiliac 11/08/2010  . Tremor, coarse 09/21/2010  . Chronic orthostatic hypotension   . Anxiety     Subjective:  CC:   Chief Complaint  Patient presents with  . Follow-up    medication refills    HPI:   TEARIA White is a 80 y.o. female who presents for Lethargy and forgetfulness. Forgot to put her teeth in today (haven't done that in 40 years)   02 sats at rest were  87 but improved to 95 with deep breathing  Using clorazepate and gabapentin at bedtime  And continues to report insomnia.    Has lost 5 lbs,  Appetite poor   Not moving bowels very often ,  Wants a nighttime medication    Past Medical History  Diagnosis Date  . Chronic orthostatic hypotension June 2012    with pprior syncopal event,  did not tolelrate florinef trial  . Anxiety   . Hyperlipidemia     History reviewed. No pertinent past surgical history.     The following portions of the patient's history were reviewed and updated as appropriate: Allergies, current medications, and problem list.    Review of Systems:   Patient denies headache, fevers, malaise, unintentional weight loss, skin rash, eye pain, sinus congestion and sinus pain, sore throat, dysphagia,  hemoptysis , cough, dyspnea, wheezing, chest pain, palpitations, orthopnea, edema, abdominal pain, nausea, melena, diarrhea, constipation, flank  pain, dysuria, hematuria, urinary  Frequency, nocturia, numbness, tingling, seizures,  Focal weakness, Loss of consciousness,  Tremor, insomnia, depression, anxiety, and suicidal ideation.     History   Social History  . Marital Status: Married    Spouse Name: N/A    Number of Children: N/A  . Years of Education: N/A   Occupational History  . Not on file.   Social History Main Topics  . Smoking status: Never Smoker   . Smokeless tobacco: Never Used  . Alcohol Use: No  . Drug Use: No  . Sexual Activity: Not Currently   Other Topics Concern  . Not on file   Social History Narrative  . No narrative on file    Objective:  Filed Vitals:   07/20/13 1154  BP: 120/64  Pulse: 85  Temp: 98.4 F (36.9 C)  Resp: 18     General appearance: alert, cooperative and appears stated age Ears: normal TM's and external ear canals both ears Throat: lips, mucosa, and tongue normal; teeth and gums normal Neck: no adenopathy, no carotid bruit, supple, symmetrical, trachea midline and thyroid not enlarged, symmetric, no tenderness/mass/nodules Back: symmetric, no curvature. ROM normal. No CVA tenderness. Lungs: clear to auscultation bilaterally Heart: regular rate and rhythm, S1, S2 normal, no murmur, click, rub or gallop Abdomen: soft, non-tender; bowel sounds normal; no masses,  no organomegaly Pulses: 2+ and symmetric Skin: Skin color, texture, turgor normal. No rashes or lesions Lymph nodes: Cervical, supraclavicular, and axillary nodes normal.  Assessment and Plan:  Insomnia Given her lethargy today I am discontinuing the clorazepate and recommending trial of mirtazaipine   Urinary frequency With no evidence of UTI by prior repeated testing   Resume trospium  Lethargy Labs not indicative of infection  thyroid or kidney disease.  MRi of brain ordered to rule out thalamic stroke.   Updated Medication List Outpatient Encounter Prescriptions as of 07/20/2013  Medication Sig   . aspirin 81 MG tablet Take 81 mg by mouth daily.    . Black Cohosh (BLACK COHOSH HOT FLASH RELIEF) 40 MG CAPS Take 1 capsule by mouth as needed.   . clorazepate (TRANXENE) 7.5 MG tablet Take 7.5 mg by mouth 3 (three) times daily.  [name] gabapentin (NEURONTIN) 100 MG capsule Take 1 capsule (100 mg total) by mouth 3 (three) times daily.  . hydrOXYzine (VISTARIL) 25 MG capsule Take 1 capsule (25 mg total) by mouth 3 (three) times daily as needed.  . Iron-Vitamins (GERITOL PO) Take by mouth.    . Lutein 20 MG CAPS Take 20 mg by mouth daily.    . magnesium oxide (MAG-OX) 400 MG tablet Take 400 mg by mouth daily.   [name] POTASSIUM GLUCONATE PO Take by mouth.    . vitamin B-12 (CYANOCOBALAMIN) 1000 MCG tablet Take 1,000 mcg by mouth daily.    . vitamin E 400 UNIT capsule Take 400 Units by mouth daily.    . diphenhydrAMINE (BENADRYL) 25 MG tablet Take 25 mg by mouth every 6 (six) hours as needed.  . fluticasone (FLONASE) 50 MCG/ACT nasal spray Place 2 sprays into both nostrils daily.  . mirtazapine (REMERON) 15 MG tablet Take 1 tablet (15 mg total) by mouth at bedtime. Increase to 2 pills after two weeks     Orders Placed This Encounter  Procedures  . Urine Culture  . DG Thoracic Spine W/Swimmers  . DG Lumbar Spine 2-3 Views  . MR Brain Wo Contrast  . TSH  . Comprehensive metabolic panel  . CBC with Differential  . Magnesium  . Vitamin B12  . Folate RBC  . Urinalysis, Routine w reflex microscopic  . Urinalysis, dipstick only    No Follow-up on file.

## 2013-07-21 ENCOUNTER — Encounter: Payer: Self-pay | Admitting: *Deleted

## 2013-07-21 LAB — FOLATE RBC: RBC Folate: 427 ng/mL (ref >280)

## 2013-07-22 ENCOUNTER — Encounter: Payer: Self-pay | Admitting: Internal Medicine

## 2013-07-22 DIAGNOSIS — R5383 Other fatigue: Secondary | ICD-10-CM | POA: Insufficient documentation

## 2013-07-22 NOTE — Assessment & Plan Note (Signed)
Given her lethargy today I am discontinuing the clorazepate and recommending trial of mirtazaipine

## 2013-07-22 NOTE — Assessment & Plan Note (Signed)
Labs not indicative of infection  thyroid or kidney disease.  MRi of brain ordered to rule out thalamic stroke.

## 2013-07-22 NOTE — Assessment & Plan Note (Signed)
With no evidence of UTI by prior repeated testing   Resume trospium

## 2013-07-24 ENCOUNTER — Telehealth: Payer: Self-pay | Admitting: *Deleted

## 2013-07-24 ENCOUNTER — Other Ambulatory Visit: Payer: Self-pay | Admitting: Internal Medicine

## 2013-07-24 ENCOUNTER — Other Ambulatory Visit (INDEPENDENT_AMBULATORY_CARE_PROVIDER_SITE_OTHER): Payer: Medicare Other | Admitting: *Deleted

## 2013-07-24 DIAGNOSIS — R5383 Other fatigue: Secondary | ICD-10-CM

## 2013-07-24 DIAGNOSIS — M545 Low back pain, unspecified: Secondary | ICD-10-CM

## 2013-07-24 DIAGNOSIS — N390 Urinary tract infection, site not specified: Secondary | ICD-10-CM

## 2013-07-24 LAB — POCT URINALYSIS DIPSTICK
Bilirubin, UA: NEGATIVE
Blood, UA: NEGATIVE
Glucose, UA: NEGATIVE
Ketones, UA: NEGATIVE
Nitrite, UA: NEGATIVE
Protein, UA: NEGATIVE
Spec Grav, UA: 1.01
Urobilinogen, UA: 1
pH, UA: 6

## 2013-07-24 LAB — URINALYSIS, ROUTINE W REFLEX MICROSCOPIC
Bilirubin Urine: NEGATIVE
Hgb urine dipstick: NEGATIVE
Ketones, ur: NEGATIVE
Nitrite: NEGATIVE
Specific Gravity, Urine: <=1.005 — AB (ref 1.000–1.030)
Total Protein, Urine: NEGATIVE
Urine Glucose: NEGATIVE
Urobilinogen, UA: 1 (ref 0.0–1.0)
pH: 6 (ref 5.0–8.0)

## 2013-07-24 MED ORDER — TRAMADOL HCL 50 MG PO TABS: 50.0000 mg | ORAL_TABLET | Freq: Two times a day (BID) | ORAL | Status: DC

## 2013-07-24 NOTE — Telephone Encounter (Signed)
UA resulted.   

## 2013-07-24 NOTE — Telephone Encounter (Signed)
Pt called states she has now began to have severe back soreness.  Further states she is barely able to move.  Please advise

## 2013-07-24 NOTE — Telephone Encounter (Signed)
Can you plase run the UA on [name]'s urine from this morning? Thanks if you already have, I can't find it

## 2013-07-26 LAB — URINE CULTURE: Colony Count: 15000

## 2013-07-29 ENCOUNTER — Ambulatory Visit: Payer: Self-pay | Admitting: Internal Medicine

## 2013-07-31 ENCOUNTER — Telehealth: Payer: Self-pay | Admitting: Internal Medicine

## 2013-07-31 DIAGNOSIS — R5383 Other fatigue: Secondary | ICD-10-CM

## 2013-07-31 NOTE — Assessment & Plan Note (Signed)
Her brain MRI was negative for acute infarct, hemorrhage and mass.  Chronic changes were noted including small vessel disease , progressive atrophy of brain and cervical spine issues due to herniated disk

## 2013-07-31 NOTE — Telephone Encounter (Signed)
Her brain MRI was negative for acute infarct, hemorrhage and mass.  Chronic changes were noted including small vessel disease , progressive atrophy of brain and cervical spine issues due to herniated disk

## 2013-08-02 ENCOUNTER — Encounter: Payer: Self-pay | Admitting: Internal Medicine

## 2013-08-07 ENCOUNTER — Telehealth: Payer: Self-pay | Admitting: Internal Medicine

## 2013-08-07 NOTE — Telephone Encounter (Signed)
Have a 11.15 on 08/09/13 please advise if we can use.

## 2013-08-07 NOTE — Telephone Encounter (Signed)
Wanting an appointment this week , the patient has sprained her back and she wants to talk about her labs.

## 2013-08-07 NOTE — Telephone Encounter (Signed)
Explained appointment for Wednesday just to address acute problem and will arrange later appointment to address any other problems patient agreed. Appointment scheduled,

## 2013-08-07 NOTE — Telephone Encounter (Signed)
Ok to give her the 11:15 but it must be stressed that this is a 15 min app t because I have a 12:00 meeting

## 2013-08-09 ENCOUNTER — Encounter: Payer: Self-pay | Admitting: Internal Medicine

## 2013-08-09 ENCOUNTER — Ambulatory Visit (INDEPENDENT_AMBULATORY_CARE_PROVIDER_SITE_OTHER): Payer: Medicare Other | Admitting: Internal Medicine

## 2013-08-09 VITALS — BP 144/70 | HR 79 | Temp 97.7°F | Resp 16 | Ht 66.0 in | Wt 121.0 lb

## 2013-08-09 DIAGNOSIS — R259 Unspecified abnormal involuntary movements: Secondary | ICD-10-CM

## 2013-08-09 DIAGNOSIS — M545 Low back pain, unspecified: Secondary | ICD-10-CM

## 2013-08-09 DIAGNOSIS — G4700 Insomnia, unspecified: Secondary | ICD-10-CM

## 2013-08-09 DIAGNOSIS — K5900 Constipation, unspecified: Secondary | ICD-10-CM

## 2013-08-09 DIAGNOSIS — G252 Other specified forms of tremor: Secondary | ICD-10-CM

## 2013-08-09 DIAGNOSIS — M51379 Other intervertebral disc degeneration, lumbosacral region without mention of lumbar back pain or lower extremity pain: Secondary | ICD-10-CM

## 2013-08-09 DIAGNOSIS — M5137 Other intervertebral disc degeneration, lumbosacral region: Secondary | ICD-10-CM

## 2013-08-09 DIAGNOSIS — M533 Sacrococcygeal disorders, not elsewhere classified: Secondary | ICD-10-CM

## 2013-08-09 LAB — POCT URINALYSIS DIPSTICK
Bilirubin, UA: NEGATIVE
Glucose, UA: NEGATIVE
Ketones, UA: NEGATIVE
Nitrite, UA: NEGATIVE
Protein, UA: NEGATIVE
Spec Grav, UA: 1.015
Urobilinogen, UA: 0.2
pH, UA: 6.5

## 2013-08-09 LAB — URINALYSIS, ROUTINE W REFLEX MICROSCOPIC
Bilirubin Urine: NEGATIVE
Hgb urine dipstick: NEGATIVE
Ketones, ur: NEGATIVE
Leukocytes, UA: NEGATIVE
Nitrite: NEGATIVE
Specific Gravity, Urine: 1.01 (ref 1.000–1.030)
Total Protein, Urine: NEGATIVE
Urine Glucose: NEGATIVE
Urobilinogen, UA: 0.2 (ref 0.0–1.0)
pH: 6.5 (ref 5.0–8.0)

## 2013-08-09 MED ORDER — MIRTAZAPINE 30 MG PO TABS: 30.0000 mg | ORAL_TABLET | Freq: Every day | ORAL | Status: DC

## 2013-08-09 MED ORDER — LACTULOSE 10 GM/15ML PO SOLN: 30.0000 g | Freq: Three times a day (TID) | ORAL | Status: DC

## 2013-08-09 MED ORDER — LACTULOSE 10 GM/15ML PO SOLN: 20.0000 g | Freq: Two times a day (BID) | ORAL | Status: DC | PRN

## 2013-08-09 NOTE — Assessment & Plan Note (Addendum)
She has spinal stenosis , scoliosis with progression of disk bulging at L2-L3 level by repeat MRI 2014.Increasing tramadol to tid ,  continue gabapentin

## 2013-08-09 NOTE — Progress Notes (Signed)
Pre-visit discussion using our clinic review tool. No additional management support is needed unless otherwise documented below in the visit note.  

## 2013-08-09 NOTE — Patient Instructions (Addendum)
I am treating your constipation  A liquid cathartic laxative called lactulose   Drink 30 ml and wait for 3 to 4 hours.,  You May repeat dose one time in 24 hours if you do nto have a bowel movement after the first dose   Then:  I want you to start taking  metamucil 2 capsules with a full glass of water every day  increaseyour daily  fluid intake to 64 ounces minimum daily  Using G2 or Powerade  We can increase the mirtazipine  At night to 30 mg  For your depression and insomnia.   We can a third tramadol tablet daily for pain.   take it during the day (morning afternon and evening)

## 2013-08-09 NOTE — Progress Notes (Signed)
Patient ID: [name], female   DOB: [birth year], 80 y.o.   MRN: [id number]   Patient Active Problem List   Diagnosis Date Noted  . Unspecified constipation 08/12/2013  . Lethargy 07/22/2013  . Urinary frequency 05/29/2012  . Venous ulcer of left lower extremity without varicose veins 05/29/2012  . Nocturnal leg cramps 12/15/2011  . Screening for colon cancer 10/27/2011  . Screening for breast cancer 10/27/2011  . Degeneration of lumbar or lumbosacral intervertebral disc 09/01/2011  . Insomnia 01/07/2011  . Hyperlipidemia 11/12/2010  . Other and unspecified hyperlipidemia 11/08/2010  . Back pain, sacroiliac 11/08/2010  . Tremor, coarse 09/21/2010  . Chronic orthostatic hypotension   . Anxiety     Subjective:  CC:   Chief Complaint  Patient presents with  . Back Pain    left sided back pain from thoracic radiating down back.  . Constipation    X 2 weeks stated by patient.    HPI:   [name] is a 80 y.o. female who presents for Right sided Back strain caused by worsening constipation and straining .  Started 2 weeks ago, no improvemen.   moving bowels only with use of senna and enema   Sleeping a lttle better but "not like before"  1 am wakes up unable to return to sleep  Using tramadol bid and gabapentin tid for back pain secondary to DDD by prior MRI.  Her brain MRI was negative for acute infarct, hemorrhage and mass but noted small vessel disease , progressive atrophy of brain and cervical spine issues due to herniated disk .  Using clorazepate and gabapentin at bedtime    Has lost 10 lbs since April      Past Medical History  Diagnosis Date  . Chronic orthostatic hypotension June 2012    with pprior syncopal event,  did not tolelrate florinef trial  . Anxiety   . Hyperlipidemia     No past surgical history on file.     The following portions of the patient's history were reviewed and updated as appropriate: Allergies, current medications, and  problem list.    Review of Systems:   Patient denies headache, fevers, malaise, unintentional weight loss, skin rash, eye pain, sinus congestion and sinus pain, sore throat, dysphagia,  hemoptysis , cough, dyspnea, wheezing, chest pain, palpitations, orthopnea, edema, abdominal pain, nausea, melena, diarrhea, constipation, flank pain, dysuria, hematuria, urinary  Frequency, nocturia, numbness, tingling, seizures,  Focal weakness, Loss of consciousness,  Tremor, insomnia, depression, anxiety, and suicidal ideation.     History   Social History  . Marital Status: Married    Spouse Name: N/A    Number of Children: N/A  . Years of Education: N/A   Occupational History  . Not on file.   Social History Main Topics  . Smoking status: Never Smoker   . Smokeless tobacco: Never Used  . Alcohol Use: No  . Drug Use: No  . Sexual Activity: Not Currently   Other Topics Concern  . Not on file   Social History Narrative  . No narrative on file    Objective:  Filed Vitals:   08/09/13 1119  BP: 144/70  Pulse: 79  Temp: 97.7 F (36.5 C)  Resp: 16     General appearance: alert, cooperative and appears stated age Ears: normal TM's and external ear canals both ears Throat: lips, mucosa, and tongue normal; teeth and gums normal Neck: no adenopathy, no carotid bruit, supple, symmetrical, trachea midline and thyroid not  enlarged, symmetric, no tenderness/mass/nodules Back: symmetric, no curvature. ROM normal. No CVA tenderness. Lungs: clear to auscultation bilaterally Heart: regular rate and rhythm, S1, S2 normal, no murmur, click, rub or gallop Abdomen: soft, non-tender; bowel sounds normal; no masses,  no organomegaly Pulses: 2+ and symmetric Skin: Skin color, texture, turgor normal. No rashes or lesions Lymph nodes: Cervical, supraclavicular, and axillary nodes normal.  Assessment and Plan:  Back pain, sacroiliac She has spinal stenosis , scoliosis with progression of disk  bulging at L2-L3 level by repeat MRI 2014.Increasing tramadol to tid ,  continue gabapentin   Degeneration of lumbar or lumbosacral intervertebral disc She has had an aggravation of low back pain. Tramadol and tylenol.   Tremor, coarse Patient inquiring about medication to control her tremor ,  Discussed trial of metoprolol  Insomnia Aggravated by complicated grief following loss of husband.  Increasing Remeron to 30 mg daily   Unspecified constipation Lactulose prescribed for prn use.  Metamucil for daily use.    Updated Medication List Outpatient Encounter Prescriptions as of 08/09/2013  Medication Sig  . aspirin 81 MG tablet Take 81 mg by mouth daily.    . Black Cohosh (BLACK COHOSH HOT FLASH RELIEF) 40 MG CAPS Take 1 capsule by mouth as needed.   . clorazepate (TRANXENE) 7.5 MG tablet Take 7.5 mg by mouth 3 (three) times daily.  . diphenhydrAMINE (BENADRYL) 25 MG tablet Take 25 mg by mouth every 6 (six) hours as needed.  . fluticasone (FLONASE) 50 MCG/ACT nasal spray Place 2 sprays into both nostrils daily.  [name] gabapentin (NEURONTIN) 100 MG capsule Take 1 capsule (100 mg total) by mouth 3 (three) times daily.  . hydrOXYzine (VISTARIL) 25 MG capsule Take 1 capsule (25 mg total) by mouth 3 (three) times daily as needed.  . Iron-Vitamins (GERITOL PO) Take by mouth.    . Lutein 20 MG CAPS Take 20 mg by mouth daily.    . magnesium oxide (MAG-OX) 400 MG tablet Take 400 mg by mouth daily.   . mirtazapine (REMERON) 30 MG tablet Take 1 tablet (30 mg total) by mouth at bedtime.  [name] POTASSIUM GLUCONATE PO Take by mouth.    . traMADol (ULTRAM) 50 MG tablet Take 1 tablet (50 mg total) by mouth 2 (two) times daily.  . vitamin B-12 (CYANOCOBALAMIN) 1000 MCG tablet Take 1,000 mcg by mouth daily.    . vitamin E 400 UNIT capsule Take 400 Units by mouth daily.    . [DISCONTINUED] mirtazapine (REMERON) 15 MG tablet Take 1 tablet (15 mg total) by mouth at bedtime. Increase to 2 pills after two weeks  .  lactulose (CHRONULAC) 10 GM/15ML solution Take 30 mLs (20 g total) by mouth 2 (two) times daily as needed for mild constipation. DO NOT USE MORE THAN ONCE A WEEK  . [DISCONTINUED] lactulose (CHRONULAC) 10 GM/15ML solution Take 45 mLs (30 g total) by mouth 3 (three) times daily. As needed to relieve constipation,  maxiumum 2 doses daily     Orders Placed This Encounter  Procedures  . Urine Culture  . Urinalysis, Routine w reflex microscopic  . POCT Urinalysis Dipstick    Return in about 4 weeks (around 09/06/2013).

## 2013-08-10 LAB — URINE CULTURE
Colony Count: NO GROWTH
Organism ID, Bacteria: NO GROWTH

## 2013-08-11 ENCOUNTER — Encounter: Payer: Self-pay | Admitting: Internal Medicine

## 2013-08-12 ENCOUNTER — Encounter: Payer: Self-pay | Admitting: Internal Medicine

## 2013-08-12 DIAGNOSIS — K5900 Constipation, unspecified: Secondary | ICD-10-CM | POA: Insufficient documentation

## 2013-08-12 HISTORY — DX: Constipation, unspecified: K59.00

## 2013-08-12 NOTE — Assessment & Plan Note (Signed)
She has had an aggravation of low back pain. Tramadol and tylenol.

## 2013-08-12 NOTE — Assessment & Plan Note (Signed)
Aggravated by complicated grief following loss of husband.  Increasing Remeron to 30 mg daily

## 2013-08-12 NOTE — Assessment & Plan Note (Addendum)
Patient inquiring about medication to control her tremor ,  Discussed trial of metoprolol

## 2013-08-12 NOTE — Assessment & Plan Note (Signed)
Lactulose prescribed for prn use.  Metamucil for daily use.

## 2013-09-13 ENCOUNTER — Encounter: Payer: Self-pay | Admitting: Internal Medicine

## 2013-09-13 ENCOUNTER — Ambulatory Visit (INDEPENDENT_AMBULATORY_CARE_PROVIDER_SITE_OTHER): Payer: Medicare Other | Admitting: Internal Medicine

## 2013-09-13 ENCOUNTER — Telehealth: Payer: Self-pay | Admitting: Internal Medicine

## 2013-09-13 VITALS — BP 120/66 | HR 86 | Temp 97.9°F | Resp 14 | Ht 66.0 in | Wt 117.5 lb

## 2013-09-13 DIAGNOSIS — M5137 Other intervertebral disc degeneration, lumbosacral region: Secondary | ICD-10-CM

## 2013-09-13 DIAGNOSIS — G252 Other specified forms of tremor: Secondary | ICD-10-CM

## 2013-09-13 DIAGNOSIS — G4700 Insomnia, unspecified: Secondary | ICD-10-CM

## 2013-09-13 DIAGNOSIS — R259 Unspecified abnormal involuntary movements: Secondary | ICD-10-CM

## 2013-09-13 MED ORDER — TIZANIDINE HCL 2 MG PO TABS: 2.0000 mg | ORAL_TABLET | Freq: Three times a day (TID) | ORAL | Status: DC | PRN

## 2013-09-13 MED ORDER — CLORAZEPATE DIPOTASSIUM 7.5 MG PO TABS: 7.5000 mg | ORAL_TABLET | Freq: Every evening | ORAL | Status: DC | PRN

## 2013-09-13 NOTE — Telephone Encounter (Signed)
The patient called the office needing a muscle relaxer for her back.

## 2013-09-13 NOTE — Assessment & Plan Note (Signed)
Refills on tranxene and hydroxyzine given,

## 2013-09-13 NOTE — Progress Notes (Signed)
Patient ID: [name], female   DOB: 12-May-1926, 80 y.o.   MRN: [id number]   Patient Active Problem List   Diagnosis Date Noted  . Unspecified constipation 08/12/2013  . Lethargy 07/22/2013  . Urinary frequency 05/29/2012  . Venous ulcer of left lower extremity without varicose veins 05/29/2012  . Nocturnal leg cramps 12/15/2011  . Screening for colon cancer 10/27/2011  . Screening for breast cancer 10/27/2011  . Degeneration of lumbar or lumbosacral intervertebral disc 09/01/2011  . Insomnia 01/07/2011  . Hyperlipidemia 11/12/2010  . Other and unspecified hyperlipidemia 11/08/2010  . Back pain, sacroiliac 11/08/2010  . Tremor, coarse 09/21/2010  . Anxiety     Subjective:  CC:   Chief Complaint  Patient presents with  . Follow-up    HPI:   [name] is a 80 y.o. female who presents for Follow up on multiple issues  Asking for refills on the clorazepate and hydroxyzine.  She has had multiple trials of sleep aids for chronic insomnia and finds this combination the most effective. Did not tolerate the mirtazipine due to excessive morning sedation and excessive dreaming  Allergic rhinitisl Using flonase as needed,  Not using benadryl  Recurrent back pain aggravated by yard work . [name] Has tried salon pas which makes it worse.,  Using ice and heat .  requesting a muscle relaxer   Bothered by tremor of hands and head,  "getting worse"  Thinks it's due to nerves"   Lots of family relatives affected,  No known FH of parkinson's diesease but nephew was treated at [facility] with some type of brain procedure (DBS?)    Past Medical History  Diagnosis Date  . Chronic orthostatic hypotension June 2012    with pprior syncopal event,  did not tolelrate florinef trial  . Anxiety   . Hyperlipidemia     No past surgical history on file.     The following portions of the patient's history were reviewed and updated as appropriate: Allergies, current medications, and problem  list.    Review of Systems:   Patient denies headache, fevers, malaise, unintentional weight loss, skin rash, eye pain, sinus congestion and sinus pain, sore throat, dysphagia,  hemoptysis , cough, dyspnea, wheezing, chest pain, palpitations, orthopnea, edema, abdominal pain, nausea, melena, diarrhea, constipation, flank pain, dysuria, hematuria, urinary  Frequency, nocturia, numbness, tingling, seizures,  Focal weakness, Loss of consciousness,  Tremor, insomnia, depression, anxiety, and suicidal ideation.     History   Social History  . Marital Status: Married    Spouse Name: N/A    Number of Children: N/A  . Years of Education: N/A   Occupational History  . Not on file.   Social History Main Topics  . Smoking status: Never Smoker   . Smokeless tobacco: Never Used  . Alcohol Use: No  . Drug Use: No  . Sexual Activity: Not Currently   Other Topics Concern  . Not on file   Social History Narrative  . No narrative on file    Objective:  Filed Vitals:   09/13/13 0902  BP: 120/66  Pulse: 86  Temp: 97.9 F (36.6 C)  Resp: 14     General appearance: alert, cooperative and appears stated age Ears: normal TM's and external ear canals both ears Throat: lips, mucosa, and tongue normal; teeth and gums normal Neck: no adenopathy, no carotid bruit, supple, symmetrical, trachea midline and thyroid not enlarged, symmetric, no tenderness/mass/nodules Back: symmetric, no curvature. ROM normal. No CVA tenderness. Lungs:  clear to auscultation bilaterally Heart: regular rate and rhythm, S1, S2 normal, no murmur, click, rub or gallop Abdomen: soft, non-tender; bowel sounds normal; no masses,  no organomegaly Pulses: 2+ and symmetric Skin: Skin color, texture, turgor normal. No rashes or lesions Lymph nodes: Cervical, supraclavicular, and axillary nodes normal.  Assessment and Plan:  Degeneration of lumbar or lumbosacral intervertebral disc With recurrent muscle spasm brought  on by continued physical labor.  Tizanidine prescribed.   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 Discussed adding a beta blocker.    Updated Medication List Outpatient Encounter Prescriptions as of 09/13/2013  Medication Sig  . aspirin 81 MG tablet Take 81 mg by mouth daily.    . Black Cohosh (BLACK COHOSH HOT FLASH RELIEF) 40 MG CAPS Take 1 capsule by mouth as needed.   . clorazepate (TRANXENE) 7.5 MG tablet Take 1 tablet (7.5 mg total) by mouth at bedtime and may repeat dose one time if needed.  . fluticasone (FLONASE) 50 MCG/ACT nasal spray Place 2 sprays into both nostrils daily as needed.  . hydrOXYzine (VISTARIL) 25 MG capsule Take 1 capsule (25 mg total) by mouth 3 (three) times daily as needed.  . Iron-Vitamins (GERITOL PO) Take by mouth.    . lactulose (CHRONULAC) 10 GM/15ML solution Take 30 mLs (20 g total) by mouth 2 (two) times daily as needed for mild constipation. DO NOT USE MORE THAN ONCE A WEEK  . Lutein 20 MG CAPS Take 20 mg by mouth daily.    . magnesium oxide (MAG-OX) 400 MG tablet Take 400 mg by mouth daily.   [name] POTASSIUM GLUCONATE PO Take by mouth.    . vitamin B-12 (CYANOCOBALAMIN) 1000 MCG tablet Take 1,000 mcg by mouth daily.    . vitamin E 400 UNIT capsule Take 400 Units by mouth daily.    . [DISCONTINUED] clorazepate (TRANXENE) 7.5 MG tablet Take 7.5 mg by mouth 3 (three) times daily.  . [DISCONTINUED] fluticasone (FLONASE) 50 MCG/ACT nasal spray Place 2 sprays into both nostrils daily.  . [DISCONTINUED] diphenhydrAMINE (BENADRYL) 25 MG tablet Take 25 mg by mouth every 6 (six) hours as needed.  . [DISCONTINUED] gabapentin (NEURONTIN) 100 MG capsule Take 1 capsule (100 mg total) by mouth 3 (three) times daily.  . [DISCONTINUED] mirtazapine (REMERON) 30 MG tablet Take 1 tablet (30 mg total) by mouth at bedtime.  . [DISCONTINUED] traMADol (ULTRAM) 50 MG  tablet Take 1 tablet (50 mg total) by mouth 2 (two) times daily.     No orders of the defined types were placed in this encounter.    Return in about 4 months (around 01/13/2014).

## 2013-09-13 NOTE — Telephone Encounter (Signed)
tizanadine MR sent to [facility]

## 2013-09-13 NOTE — Assessment & Plan Note (Signed)
With recurrent muscle spasm brought on by continued physical labor.  Tizanidine prescribed.

## 2013-09-13 NOTE — Patient Instructions (Signed)
I am refilling your clorazepate as requested for your insomnia  I can refer you to one of our neurologists for your tremor if you would like to see a specialist  If you would like to try metoprolol first. Let me know

## 2013-09-13 NOTE — Progress Notes (Signed)
Pre visit review using our clinic review tool, if applicable. No additional management support is needed unless otherwise documented below in the visit note. 

## 2013-09-13 NOTE — Assessment & Plan Note (Signed)
She has a coarse essential tremor of hands and head which is becoming problematic. Discussed adding a beta blocker.

## 2013-09-15 ENCOUNTER — Ambulatory Visit: Payer: Medicare Other | Admitting: Internal Medicine

## 2013-09-19 ENCOUNTER — Telehealth: Payer: Self-pay | Admitting: Internal Medicine

## 2013-09-19 DIAGNOSIS — R251 Tremor, unspecified: Secondary | ICD-10-CM

## 2013-09-19 NOTE — Telephone Encounter (Signed)
Patient needing a neurology referral for her shaking and back.

## 2013-09-19 NOTE — Telephone Encounter (Signed)
Does sh prefer to stay in [place]?

## 2013-09-20 NOTE — Telephone Encounter (Signed)
Patient stated you had advised of a neurologist in [place] but patient is willing to go to one of your preference.

## 2013-09-20 NOTE — Telephone Encounter (Signed)
No I would like her to see one of the [facility] neurologists. Referral is in process as requested

## 2013-09-20 NOTE — Telephone Encounter (Signed)
Patient notifed

## 2013-09-28 ENCOUNTER — Encounter: Payer: Self-pay | Admitting: Internal Medicine

## 2013-09-28 ENCOUNTER — Telehealth: Payer: Self-pay | Admitting: Internal Medicine

## 2013-09-28 ENCOUNTER — Ambulatory Visit (INDEPENDENT_AMBULATORY_CARE_PROVIDER_SITE_OTHER): Payer: Medicare Other | Admitting: Internal Medicine

## 2013-09-28 VITALS — BP 122/88 | HR 72 | Temp 98.0°F | Wt 118.0 lb

## 2013-09-28 DIAGNOSIS — M545 Low back pain, unspecified: Secondary | ICD-10-CM

## 2013-09-28 DIAGNOSIS — M5430 Sciatica, unspecified side: Secondary | ICD-10-CM

## 2013-09-28 DIAGNOSIS — M5442 Lumbago with sciatica, left side: Secondary | ICD-10-CM

## 2013-09-28 MED ORDER — PREDNISONE 10 MG PO TABS: ORAL_TABLET | ORAL | Status: DC

## 2013-09-28 NOTE — Telephone Encounter (Signed)
Referral is in process as requested TO [facility]

## 2013-09-28 NOTE — Progress Notes (Signed)
Pre visit review using our clinic review tool, if applicable. No additional management support is needed unless otherwise documented below in the visit note. 

## 2013-09-28 NOTE — Telephone Encounter (Signed)
Pt is requesting a referral to Orthopedic.  My apologies.

## 2013-09-28 NOTE — Telephone Encounter (Addendum)
Pt was seen today by Webb Silversmith for back pain. The pain radiates down the left leg. This is a chronic issue for her.It is worse with walking, standing or bending. It is better with sitting down.  Pt is not requesting a referral to Orthopedic for same.  Please advise

## 2013-09-28 NOTE — Patient Instructions (Addendum)
Back Pain, Adult °Back pain is very common. The pain often gets better over time. The cause of back pain is usually not dangerous. Most people can learn to manage their back pain on their own.  °HOME CARE  °· Stay active. Start with short walks on flat ground if you can. Try to walk farther each day. °· Do not sit, drive, or stand in one place for more than 30 minutes. Do not stay in bed. °· Do not avoid exercise or work. Activity can help your back heal faster. °· Be careful when you bend or lift an object. Bend at your knees, keep the object close to you, and do not twist. °· Sleep on a firm mattress. Lie on your side, and bend your knees. If you lie on your back, put a pillow under your knees. °· Only take medicines as told by your doctor. °· Put ice on the injured area. °¨ Put ice in a plastic bag. °¨ Place a towel between your skin and the bag. °¨ Leave the ice on for 15-20 minutes, 03-04 times a day for the first 2 to 3 days. After that, you can switch between ice and heat packs. °· Ask your doctor about back exercises or massage. °· Avoid feeling anxious or stressed. Find good ways to deal with stress, such as exercise. °GET HELP RIGHT AWAY IF:  °· Your pain does not go away with rest or medicine. °· Your pain does not go away in 1 week. °· You have new problems. °· You do not feel well. °· The pain spreads into your legs. °· You cannot control when you poop (bowel movement) or pee (urinate). °· Your arms or legs feel weak or lose feeling (numbness). °· You feel sick to your stomach (nauseous) or throw up (vomit). °· You have belly (abdominal) pain. °· You feel like you may pass out (faint). °MAKE SURE YOU:  °· Understand these instructions. °· Will watch your condition. °· Will get help right away if you are not doing well or get worse. °Document Released: 06/10/2007 Document Revised: 03/16/2011 Document Reviewed: 04/25/2013 °Exit[employer]® Patient Information ©2015 [employer]. This information is not intended  to replace advice given to you by your health care provider. Make sure you discuss any questions you have with your health care provider. ° °

## 2013-09-28 NOTE — Progress Notes (Signed)
Subjective:    Patient ID: [name], female    DOB: Dec 01, 1926, 80 y.o.   MRN: [id number]  HPI  Pt presents to the clinic today with c/o back pain. The pain radiates down the left leg. This is a chronic issue for her.It is worse with walking, standing or bending. It is better with sitting down. She was recently given zanaflex by Dr. Derrel Nip for the same complaint. Prior to the muscle relaxer, she was given tramadol and gabapentin. She has spinal stenosis, scoliosis with progression of disk bulging at L2-L3 level by repeat MRI 2014. She reports the medication she has been given is not helping. She did see her chiropractor who is concerned that this may be a pinched nerve. She also called an orthopedic doctor, who advised her to follow up with her PCP. She is not currently under the care of any orthopedic doctor.    Review of Systems      Past Medical History  Diagnosis Date  . Chronic orthostatic hypotension June 2012    with pprior syncopal event,  did not tolelrate florinef trial  . Anxiety   . Hyperlipidemia     Current Outpatient Prescriptions  Medication Sig Dispense Refill  . aspirin 81 MG tablet Take 81 mg by mouth daily.        . Black Cohosh (BLACK COHOSH HOT FLASH RELIEF) 40 MG CAPS Take 1 capsule by mouth as needed.       . clorazepate (TRANXENE) 7.5 MG tablet Take 1 tablet (7.5 mg total) by mouth at bedtime and may repeat dose one time if needed.  60 tablet  3  . fluticasone (FLONASE) 50 MCG/ACT nasal spray Place 2 sprays into both nostrils daily as needed.      . hydrOXYzine (VISTARIL) 25 MG capsule Take 1 capsule (25 mg total) by mouth 3 (three) times daily as needed.  90 capsule  11  . Iron-Vitamins (GERITOL PO) Take by mouth.        . lactulose (CHRONULAC) 10 GM/15ML solution Take 30 mLs (20 g total) by mouth 2 (two) times daily as needed for mild constipation. DO NOT USE MORE THAN ONCE A WEEK  240 mL  0  . Lutein 20 MG CAPS Take 20 mg by mouth daily.        .  magnesium oxide (MAG-OX) 400 MG tablet Take 400 mg by mouth daily.       [name] POTASSIUM GLUCONATE PO Take by mouth.        [name] tiZANidine (ZANAFLEX) 2 MG tablet Take 1 tablet (2 mg total) by mouth every 8 (eight) hours as needed for muscle spasms.  30 tablet  1  . vitamin B-12 (CYANOCOBALAMIN) 1000 MCG tablet Take 1,000 mcg by mouth daily.        . vitamin E 400 UNIT capsule Take 400 Units by mouth daily.         No current facility-administered medications for this visit.    No Known Allergies  Family History  Problem Relation Age of Onset  . Cancer Neg Hx   . Diabetes Neg Hx   . Heart disease Neg Hx     History   Social History  . Marital Status: Married    Spouse Name: N/A    Number of Children: N/A  . Years of Education: N/A   Occupational History  . Not on file.   Social History Main Topics  . Smoking status: Never Smoker   . Smokeless  tobacco: Never Used  . Alcohol Use: No  . Drug Use: No  . Sexual Activity: Not Currently   Other Topics Concern  . Not on file   Social History Narrative  . No narrative on file     Constitutional: Denies fever, malaise, fatigue, headache or abrupt weight changes.  Musculoskeletal: Pt reports back pain. Denies decrease in range of motion, difficulty with gait,  or joint pain and swelling.   No other specific complaints in a complete review of systems (except as listed in HPI above).  Objective:   Physical Exam   BP 122/88  Pulse 72  Temp(Src) 98 F (36.7 C)  Wt 118 lb (53.524 kg)  SpO2 98% Wt Readings from Last 3 Encounters:  09/28/13 118 lb (53.524 kg)  09/13/13 117 lb 8 oz (53.298 kg)  08/09/13 121 lb (54.885 kg)    General: Appears her stated age, well developed, well nourished in NAD. Cardiovascular: Normal rate and rhythm. S1,S2 noted.  No murmur, rubs or gallops noted.  Pulmonary/Chest: Normal effort and positive vesicular breath sounds. No respiratory distress. No wheezes, rales or ronchi noted.    Musculoskeletal: Decreased flexion and extension. Pain with palpation of the left SI joint. Strength 5/5 BLE.   BMET    Component Value Date/Time   NA 136 07/20/2013 1221   K 4.6 07/20/2013 1221   CL 100 07/20/2013 1221   CO2 29 07/20/2013 1221   GLUCOSE 94 07/20/2013 1221   BUN 13 07/20/2013 1221   CREATININE 0.9 07/20/2013 1221   CALCIUM 9.2 07/20/2013 1221    Lipid Panel     Component Value Date/Time   CHOL 175 07/20/2013 1221   TRIG 79.0 07/20/2013 1221   HDL 37.70* 07/20/2013 1221   CHOLHDL 5 07/20/2013 1221   VLDL 15.8 07/20/2013 1221   LDLCALC 122* 07/20/2013 1221    CBC    Component Value Date/Time   WBC 12.1* 07/20/2013 1221   RBC 3.26* 07/20/2013 1221   HGB 10.2* 07/20/2013 1221   HCT 31.0* 07/20/2013 1221   PLT 383.0 07/20/2013 1221   MCV 95.0 07/20/2013 1221   MCHC 32.9 07/20/2013 1221   RDW 13.6 07/20/2013 1221   LYMPHSABS 1.5 07/20/2013 1221   MONOABS 0.9 07/20/2013 1221   EOSABS 0.1 07/20/2013 1221   BASOSABS 0.1 07/20/2013 1221    Hgb A1C No results found for this basename: HGBA1C        Assessment & Plan:   Low back pain with left side sciatica:  Will try prednisone taper to see if there is any improvement Would discuss with PCP possible ortho referral for injection Continue tramadol and gabapentin  RTC as needed or if symptoms persist or worsen

## 2013-09-28 NOTE — Telephone Encounter (Signed)
Spoke with pt advised referral sent

## 2013-09-28 NOTE — Telephone Encounter (Signed)
The patient is needing a referral to an orthopedic specialist for her back. She saw Marko Plume on 9.24.15.

## 2013-09-28 NOTE — Telephone Encounter (Signed)
Please clarify your message "patient is not requesting a referral to orthopedics....."

## 2013-10-02 ENCOUNTER — Telehealth: Payer: Self-pay | Admitting: Neurology

## 2013-10-02 ENCOUNTER — Ambulatory Visit (INDEPENDENT_AMBULATORY_CARE_PROVIDER_SITE_OTHER): Payer: Medicare Other | Admitting: Neurology

## 2013-10-02 ENCOUNTER — Encounter: Payer: Self-pay | Admitting: Neurology

## 2013-10-02 VITALS — BP 140/84 | HR 68 | Ht 66.0 in | Wt 118.1 lb

## 2013-10-02 DIAGNOSIS — IMO0002 Reserved for concepts with insufficient information to code with codable children: Secondary | ICD-10-CM

## 2013-10-02 DIAGNOSIS — M5416 Radiculopathy, lumbar region: Secondary | ICD-10-CM

## 2013-10-02 DIAGNOSIS — G252 Other specified forms of tremor: Principal | ICD-10-CM

## 2013-10-02 DIAGNOSIS — G250 Essential tremor: Secondary | ICD-10-CM | POA: Insufficient documentation

## 2013-10-02 DIAGNOSIS — G609 Hereditary and idiopathic neuropathy, unspecified: Secondary | ICD-10-CM

## 2013-10-02 DIAGNOSIS — F330 Major depressive disorder, recurrent, mild: Secondary | ICD-10-CM

## 2013-10-02 NOTE — Telephone Encounter (Signed)
Please advise 

## 2013-10-02 NOTE — Progress Notes (Signed)
[name] was seen today in the movement disorders clinic for neurologic consultation at the request of TULLO,TERESA, MD.  The consultation is for the evaluation of tremor.  I reviewed medical records available to me.  Her niece accompanied her to the appt and supplements the hx.  Pt has had tremor for over 3 years.  Notes in 2013 state that pt was worried about PD because of tremor then; she was offered neurology consult then but refused it back in 2013.  Pt reports that it started in the head and that was over 10 years ago and it didn't bother the patient back then.  She states that it now involves the hands as well.  She thinks that it is caused by "nerves and anxiety."  She states that 3 of her nephews and her niece shake (her dads side);  Her grandfather shook as well.  One of her nephews has had DBS.  Specific Symptoms:  Tremor: Yes.    Affected by caffeine:  No. (1/2 cup to one cup coffee per day; 1 pepsi per day)  Affected by alcohol:  Doesn't drink  Affected by stress:  Yes.    Affected by fatigue:  No.  Spills soup if on spoon:  Hasn't had soup in a year  Spills glass of liquid if full:  No. but has to hold with both hands  Affects ADL's (tying shoes, brushing teeth, etc):  No. Voice: no changes Sleep: sleeps well; notes indicate hx of nocturnal leg cramping.  Vivid Dreams:  No. (some meds in the past have caused dreams)  Acting out dreams:  No. Wet Pillows: No. Postural symptoms:  "comes and goes"  Falls?  Last fall in July, slipped on wood floor while in nylons and fell Bradykinesia symptoms: difficulty getting out of a chair but relates to back pain Loss of smell:  No. Loss of taste:  Yes.   but relates to depression/grieving from death of husband Urinary Incontinence:  No. Difficulty Swallowing:  No. Handwriting, micrographia: No. but it is sloppy Trouble with ADL's:  No.  Trouble buttoning clothing: No. Depression:  Yes.   Memory changes:  No. Hallucinations:   No.  visual distortions: No. N/V:  No. Lightheaded:  No. (has been on florinef in the past for [employer] but is no longer)  Syncope: No. Diplopia:  No. Dyskinesia:  No.  Her biggest c/o is really back pain.  Apparently had MRI in July but I don't have that.  Last MRI I have is from 03/2012.  She tried to get appt with Dr. Vertell Limber but they wanted even more updated MRI than July as she didn't have this pain in July.  Radiates down the L leg.  Started after strained BM.  Chiropractor not been of help.   ALLERGIES:  No Known Allergies  CURRENT MEDICATIONS:  Outpatient Encounter Prescriptions as of 10/02/2013  Medication Sig  . aspirin 81 MG tablet Take 81 mg by mouth daily.    . Black Cohosh (BLACK COHOSH HOT FLASH RELIEF) 40 MG CAPS Take 1 capsule by mouth as needed.   . clorazepate (TRANXENE) 7.5 MG tablet Take 1 tablet (7.5 mg total) by mouth at bedtime and may repeat dose one time if needed.  . fluticasone (FLONASE) 50 MCG/ACT nasal spray Place 2 sprays into both nostrils daily as needed.  . hydrOXYzine (VISTARIL) 25 MG capsule Take 1 capsule (25 mg total) by mouth 3 (three) times daily as needed.  . Iron-Vitamins (GERITOL PO)  Take by mouth.    . lactulose (CHRONULAC) 10 GM/15ML solution Take 30 mLs (20 g total) by mouth 2 (two) times daily as needed for mild constipation. DO NOT USE MORE THAN ONCE A WEEK  . Lutein 20 MG CAPS Take 20 mg by mouth daily.    . magnesium oxide (MAG-OX) 400 MG tablet Take 400 mg by mouth daily.   . mirtazapine (REMERON) 30 MG tablet   . POTASSIUM GLUCONATE PO Take by mouth.    . predniSONE (DELTASONE) 10 MG tablet Take 3 tabs on days 1-3, take 2 tabs on days 4-6, take 1 tab on days 7-9  . traMADol (ULTRAM) 50 MG tablet   . vitamin B-12 (CYANOCOBALAMIN) 1000 MCG tablet Take 1,000 mcg by mouth daily.    . vitamin E 400 UNIT capsule Take 400 Units by mouth daily.    . [DISCONTINUED] gabapentin (NEURONTIN) 100 MG capsule   . [DISCONTINUED] tiZANidine (ZANAFLEX) 2 MG  tablet Take 1 tablet (2 mg total) by mouth every 8 (eight) hours as needed for muscle spasms.    PAST MEDICAL HISTORY:   Past Medical History  Diagnosis Date  . Chronic orthostatic hypotension June 2012    with pprior syncopal event,  did not tolelrate florinef trial  . Anxiety   . Hyperlipidemia   . Depression   . Back pain     PAST SURGICAL HISTORY:   Past Surgical History  Procedure Laterality Date  . Ankle surgery Left   . Wrist surgery Right   . Cataract extraction, bilateral      SOCIAL HISTORY:   History   Social History  . Marital Status: Married    Spouse Name: N/A    Number of Children: N/A  . Years of Education: N/A   Occupational History  . retired     Fish farm manager   Social History Main Topics  . Smoking status: Never Smoker   . Smokeless tobacco: Never Used  . Alcohol Use: No  . Drug Use: No  . Sexual Activity: Not Currently   Other Topics Concern  . Not on file   Social History Narrative  . No narrative on file    FAMILY HISTORY:   Family Status  Relation Status Death Age  . Mother Deceased     colon cancer  . Father Deceased     renal failure  . Brother Deceased     72, unknown causes  . Sister Deceased     56, unknown cause    ROS:  Weight loss since husbands death in 22-Apr-2022.  No appetite.  A complete 10 system review of systems was obtained and was unremarkable apart from what is mentioned above.  PHYSICAL EXAMINATION:    VITALS:   Filed Vitals:   10/02/13 0806  BP: 140/84  Pulse: 68  Height: 5\' 6"  (1.676 m)  Weight: 118 lb 2 oz (53.581 kg)  SpO2: 97%    GEN:  The patient appears stated age and is in NAD. HEENT:  Normocephalic, atraumatic.  The mucous membranes are moist. The superficial temporal arteries are without ropiness or tenderness. CV:  RRR Lungs:  CTAB Neck/HEME:  There are no carotid bruits bilaterally.  Neurological examination:  Orientation: The patient is alert and oriented x3. Fund of  knowledge is appropriate.  Recent and remote memory are intact.  Attention and concentration are normal.    Able to name objects and repeat phrases. Cranial nerves: There is good facial symmetry.  Extraocular muscles  are intact. The visual fields are full to confrontational testing. The speech is fluent and clear. Soft palate rises symmetrically and there is no tongue deviation. Hearing is intact to conversational tone. Sensation: Sensation is intact to light and pinprick throughout (facial, trunk, extremities). Vibration is decreased distally. There is no extinction with double simultaneous stimulation. There is no sensory dermatomal level identified. Motor: Strength is 5/5 in the bilateral upper and lower extremities.   Shoulder shrug is equal and symmetric.  There is no pronator drift. Deep tendon reflexes: Deep tendon reflexes are 2+/4 at the bilateral biceps, triceps, brachioradialis, patella and achilles. Plantar responses are downgoing bilaterally.  Movement examination: Tone: There is normal tone in the bilateral upper extremities.  The tone in the lower extremities is normal.  Abnormal movements: There is complex head titubation, but it is primarily in the "yes" direction.  There is mild leg tremor.  There is tremor at rest in the hands, but it is much more evident with intention.  She has difficulty with Archimedes spirals, although fairly minimally.  She spills water somewhat when pouring a full glass of water from one glass to another. Coordination:  There is no decremation with RAM's, with any form of rapid alternating movements, including alternating supination and pronation of the forearm, hand opening and closing, finger taps, heel taps and toe taps. Gait and Station: The patient has minimal difficulty arising out of a deep-seated chair without the use of the hands.  The biggest issue is back pain.  The patient's stride length is normal, but she is slow in tenuous because of back pain.     ASSESSMENT/PLAN:  1.  Essential tremor.  -This is evidenced by the long-standing history, strong family history and history of gradually getting worse.  We talked about various treatment options.  Patient education was provided.  We talked about options ranging from medical equipment (weighted gloves and utensils), medications and surgical options.  She is really not interested in any of these.  We talked about the role of caffeine and alcohol.  She rarely drinks very little caffeine and basically drinks no alcohol.  The only significant interference tremor is providing is with writing her checks and her niece is helping her.  She will let me know if she needs further treatment in the future. 2.  low back pain and likely lumbar radiculopathy.  -I. offered to help her get an appointment with Dr. Vertell Limber, as she was having difficulties herself.  She states that she now has an appointment with [facility].  She really is not looking for surgical options anyway, but rather pain management options.  She will let me know if I can help her facilitate this appointment, but I don't have her most recent MRI either. 3.  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  Safety discussed.  Not causing paresthesias. 4.  followup with me will be on an as-needed basis.  Greater than 50% of the 45 minute visit was spent in counseling, as above.

## 2013-10-02 NOTE — Telephone Encounter (Signed)
Pt. wants referral to Dr. Vertell Limber or a provider in his group regarding her back / Sherri S.

## 2013-10-02 NOTE — Telephone Encounter (Signed)
[name] will you get a copy of her July MRI of the lumbar spine from [place] and then send a referral.

## 2013-10-03 ENCOUNTER — Telehealth: Payer: Self-pay | Admitting: Neurology

## 2013-10-03 NOTE — Telephone Encounter (Signed)
She had MRI at [place], we just don't have results.  I thought that she only had x-ray too but she did have July MRI (I can see the order even and pt said that she got the bill) but no results in epic.  PCP made note of it being ordered?

## 2013-10-03 NOTE — Telephone Encounter (Signed)
Pt wants to talk to someone about referral [phone]

## 2013-10-03 NOTE — Telephone Encounter (Signed)
Spoke with patient and made her aware. She is going to talk with [facility] tomorrow and let me know if she wants to proceed with MR lumbar spine.

## 2013-10-03 NOTE — Telephone Encounter (Signed)
[facility] states last MR lumbar spine 03/2012

## 2013-10-03 NOTE — Telephone Encounter (Signed)
Spoke with patient and made her aware I am sending referral to [facility]. She wants to see 1st available MD. Referral to be faxed after we receive MR results from [facility]. Patient will pick up films and bring to her appt.

## 2013-10-03 NOTE — Telephone Encounter (Signed)
Okay.  Then she will likely need MRI L-spine updated before sees stern.  Talk to pt.  Let her know as she was insistent yesterday that she had one done in July.

## 2013-10-03 NOTE — Telephone Encounter (Signed)
Patient did not have MR - she had XRay of Lumbar spine. Results received and referral faxed to [facility] at [phone] with confirmation received. They will contact patient.

## 2013-10-03 NOTE — Telephone Encounter (Signed)
Request sent to [facility] at [phone] for MR results.

## 2013-10-04 ENCOUNTER — Telehealth: Payer: Self-pay | Admitting: *Deleted

## 2013-10-04 DIAGNOSIS — M5416 Radiculopathy, lumbar region: Secondary | ICD-10-CM

## 2013-10-04 NOTE — Telephone Encounter (Signed)
Does she need MR Lumbar spine with and without for back pain?

## 2013-10-04 NOTE — Telephone Encounter (Signed)
No,  Just without

## 2013-10-04 NOTE — Telephone Encounter (Signed)
FYI patient had a MRI of head at [facility] not MRI back

## 2013-10-05 NOTE — Telephone Encounter (Signed)
Spoke with patient and MR Lumbar spine scheduled 10/07/2013 at 2:45 pm at [place] per patient's request. Scan will be faxed to [facility] once completed.

## 2013-10-07 ENCOUNTER — Inpatient Hospital Stay: Admission: RE | Admit: 2013-10-07 | Payer: Medicare Other | Source: Ambulatory Visit

## 2013-10-11 ENCOUNTER — Telehealth: Payer: Self-pay | Admitting: Neurology

## 2013-10-11 NOTE — Telephone Encounter (Signed)
Janelle - Please advise.

## 2013-10-11 NOTE — Telephone Encounter (Signed)
It's done

## 2013-10-11 NOTE — Telephone Encounter (Signed)
Pt called f/u to see if the MRI has been approved for her to have. C/B [phone]

## 2013-10-12 ENCOUNTER — Emergency Department (HOSPITAL_COMMUNITY)
Admission: EM | Admit: 2013-10-12 | Discharge: 2013-10-12 | Disposition: A | Discharge status: Home / Self Care | Payer: Medicare Other | Attending: Emergency Medicine | Admitting: Emergency Medicine

## 2013-10-12 ENCOUNTER — Encounter (HOSPITAL_COMMUNITY): Payer: Self-pay | Admitting: Emergency Medicine

## 2013-10-12 DIAGNOSIS — Z8679 Personal history of other diseases of the circulatory system: Secondary | ICD-10-CM | POA: Diagnosis not present

## 2013-10-12 DIAGNOSIS — M5432 Sciatica, left side: Secondary | ICD-10-CM | POA: Insufficient documentation

## 2013-10-12 DIAGNOSIS — Z79899 Other long term (current) drug therapy: Secondary | ICD-10-CM | POA: Diagnosis not present

## 2013-10-12 DIAGNOSIS — R634 Abnormal weight loss: Secondary | ICD-10-CM | POA: Diagnosis not present

## 2013-10-12 DIAGNOSIS — F419 Anxiety disorder, unspecified: Secondary | ICD-10-CM | POA: Diagnosis not present

## 2013-10-12 DIAGNOSIS — Z7952 Long term (current) use of systemic steroids: Secondary | ICD-10-CM | POA: Diagnosis not present

## 2013-10-12 DIAGNOSIS — F329 Major depressive disorder, single episode, unspecified: Secondary | ICD-10-CM | POA: Diagnosis not present

## 2013-10-12 DIAGNOSIS — Z7982 Long term (current) use of aspirin: Secondary | ICD-10-CM | POA: Diagnosis not present

## 2013-10-12 DIAGNOSIS — Z8639 Personal history of other endocrine, nutritional and metabolic disease: Secondary | ICD-10-CM | POA: Diagnosis not present

## 2013-10-12 DIAGNOSIS — M549 Dorsalgia, unspecified: Secondary | ICD-10-CM | POA: Diagnosis present

## 2013-10-12 MED ORDER — CYCLOBENZAPRINE HCL 10 MG PO TABS: 10.0000 mg | ORAL_TABLET | Freq: Two times a day (BID) | ORAL | Status: DC | PRN

## 2013-10-12 NOTE — ED Notes (Signed)
Pt states since March when her husband died she has lost a lot of weight; suffered from constipation and has to strain for bowel movements; when she strained she pulled something in her back in August; has pain in left hip area with walking and standing; no pain with sitting or lying; pain radiates down left leg; has been seeing a chiropractor and a neurologist with no relief

## 2013-10-12 NOTE — Discharge Instructions (Signed)
Sciatica Sciatica is pain, weakness, numbness, or tingling along the path of the sciatic nerve. The nerve starts in the lower back and runs down the back of each leg. The nerve controls the muscles in the lower leg and in the back of the knee, while also providing sensation to the back of the thigh, lower leg, and the sole of your foot. Sciatica is a symptom of another medical condition. For instance, nerve damage or certain conditions, such as a herniated disk or bone spur on the spine, pinch or put pressure on the sciatic nerve. This causes the pain, weakness, or other sensations normally associated with sciatica. Generally, sciatica only affects one side of the body. CAUSES   Herniated or slipped disc.  Degenerative disk disease.  A pain disorder involving the narrow muscle in the buttocks (piriformis syndrome).  Pelvic injury or fracture.  Pregnancy.  Tumor (rare). SYMPTOMS  Symptoms can vary from mild to very severe. The symptoms usually travel from the low back to the buttocks and down the back of the leg. Symptoms can include:  Mild tingling or dull aches in the lower back, leg, or hip.  Numbness in the back of the calf or sole of the foot.  Burning sensations in the lower back, leg, or hip.  Sharp pains in the lower back, leg, or hip.  Leg weakness.  Severe back pain inhibiting movement. These symptoms may get worse with coughing, sneezing, laughing, or prolonged sitting or standing. Also, being overweight may worsen symptoms. DIAGNOSIS  Your caregiver will perform a physical exam to look for common symptoms of sciatica. He or she may ask you to do certain movements or activities that would trigger sciatic nerve pain. Other tests may be performed to find the cause of the sciatica. These may include:  Blood tests.  X-rays.  Imaging tests, such as an MRI or CT scan. TREATMENT  Treatment is directed at the cause of the sciatic pain. Sometimes, treatment is not necessary  and the pain and discomfort goes away on its own. If treatment is needed, your caregiver may suggest:  Over-the-counter medicines to relieve pain.  Prescription medicines, such as anti-inflammatory medicine, muscle relaxants, or narcotics.  Applying heat or ice to the painful area.  Steroid injections to lessen pain, irritation, and inflammation around the nerve.  Reducing activity during periods of pain.  Exercising and stretching to strengthen your abdomen and improve flexibility of your spine. Your caregiver may suggest losing weight if the extra weight makes the back pain worse.  Physical therapy.  Surgery to eliminate what is pressing or pinching the nerve, such as a bone spur or part of a herniated disk. HOME CARE INSTRUCTIONS   Only take over-the-counter or prescription medicines for pain or discomfort as directed by your caregiver.  Apply ice to the affected area for 20 minutes, 3-4 times a day for the first 48-72 hours. Then try heat in the same way.  Exercise, stretch, or perform your usual activities if these do not aggravate your pain.  Attend physical therapy sessions as directed by your caregiver.  Keep all follow-up appointments as directed by your caregiver.  Do not wear high heels or shoes that do not provide proper support.  Check your mattress to see if it is too soft. A firm mattress may lessen your pain and discomfort. SEEK IMMEDIATE MEDICAL CARE IF:   You lose control of your bowel or bladder (incontinence).  You have increasing weakness in the lower back, pelvis, buttocks,   or legs.  You have redness or swelling of your back.  You have a burning sensation when you urinate.  You have pain that gets worse when you lie down or awakens you at night.  Your pain is worse than you have experienced in the past.  Your pain is lasting longer than 4 weeks.  You are suddenly losing weight without reason. MAKE SURE YOU:  Understand these  instructions.  Will watch your condition.  Will get help right away if you are not doing well or get worse. Document Released: 12/16/2000 Document Revised: 06/23/2011 Document Reviewed: 05/03/2011 ExitCare Patient Information 2015 [employer]. This information is not intended to replace advice given to you by your health care provider. Make sure you discuss any questions you have with your health care provider.  

## 2013-10-12 NOTE — ED Notes (Signed)
Per pt, states she was straining to move bowels and did something to her back-pain with ambulation

## 2013-10-12 NOTE — ED Provider Notes (Signed)
CSN: [id number]     Arrival date & time 10/12/13  2956 History   First MD Initiated Contact with Patient 10/12/13 [phone]     Chief Complaint  Patient presents with  . Back Pain     (Consider location/radiation/quality/duration/timing/severity/associated sxs/prior Treatment) Patient is a 80 y.o. female presenting with back pain. The history is provided by the patient.  Back Pain Location:  Gluteal region Quality:  Shooting Radiates to:  L posterior upper leg Pain severity:  Moderate Onset quality:  Gradual Duration:  2 months Timing:  Constant Progression:  Unchanged Chronicity:  New Context comment:  Straining with bowel movement Relieved by:  Nothing Worsened by:  Nothing tried Ineffective treatments:  Muscle relaxants (tramadol, tizanidine) Associated symptoms: weight loss   Associated symptoms: no abdominal pain, no bladder incontinence, no bowel incontinence, no chest pain, no dysuria, no fever, no numbness, no paresthesias and no perianal numbness     Past Medical History  Diagnosis Date  . Chronic orthostatic hypotension June 2012    with pprior syncopal event,  did not tolelrate florinef trial  . Anxiety   . Hyperlipidemia   . Depression   . Back pain    Past Surgical History  Procedure Laterality Date  . Ankle surgery Left   . Wrist surgery Right   . Cataract extraction, bilateral     Family History  Problem Relation Age of Onset  . Cancer Neg Hx   . Diabetes Neg Hx   . Heart disease Neg Hx    History  Substance Use Topics  . Smoking status: Never Smoker   . Smokeless tobacco: Never Used  . Alcohol Use: No   OB History   Grav Para Term Preterm Abortions TAB SAB Ect Mult Living                 Review of Systems  Constitutional: Positive for weight loss. Negative for fever and chills.  Respiratory: Negative for cough and shortness of breath.   Cardiovascular: Negative for chest pain.  Gastrointestinal: Negative for vomiting, abdominal pain and  bowel incontinence.  Genitourinary: Negative for bladder incontinence and dysuria.  Musculoskeletal: Positive for back pain.  Neurological: Negative for numbness and paresthesias.  All other systems reviewed and are negative.     Allergies  Review of patient's allergies indicates no known allergies.  Home Medications   Prior to Admission medications   Medication Sig Start Date End Date Taking? Authorizing Provider  aspirin 81 MG tablet Take 81 mg by mouth daily.      Historical Provider, MD  Black Cohosh (BLACK COHOSH HOT FLASH RELIEF) 40 MG CAPS Take 1 capsule by mouth as needed.     Historical Provider, MD  clorazepate (TRANXENE) 7.5 MG tablet Take 1 tablet (7.5 mg total) by mouth at bedtime and may repeat dose one time if needed. 09/13/13   Crecencio Mc, MD  cyclobenzaprine (FLEXERIL) 10 MG tablet Take 1 tablet (10 mg total) by mouth 2 (two) times daily as needed for muscle spasms. 10/12/13   Evelina Bucy, MD  fluticasone [facility]) 50 MCG/ACT nasal spray Place 2 sprays into both nostrils daily as needed. 06/15/13   Raquel Dagoberto Ligas, NP  hydrOXYzine (VISTARIL) 25 MG capsule Take 1 capsule (25 mg total) by mouth 3 (three) times daily as needed. 04/28/13   Crecencio Mc, MD  Iron-Vitamins (GERITOL PO) Take by mouth.      Historical Provider, MD  lactulose (CHRONULAC) 10 GM/15ML solution Take 30 mLs (20  g total) by mouth 2 (two) times daily as needed for mild constipation. DO NOT USE MORE THAN ONCE A WEEK 08/09/13   Crecencio Mc, MD  Lutein 20 MG CAPS Take 20 mg by mouth daily.      Historical Provider, MD  magnesium oxide (MAG-OX) 400 MG tablet Take 400 mg by mouth daily.     Historical Provider, MD  mirtazapine (REMERON) 30 MG tablet  08/09/13   Historical Provider, MD  POTASSIUM GLUCONATE PO Take by mouth.      Historical Provider, MD  predniSONE (DELTASONE) 10 MG tablet Take 3 tabs on days 1-3, take 2 tabs on days 4-6, take 1 tab on days 7-9 09/28/13   Jearld Fenton, NP  traMADol [facility]) 50  MG tablet  07/24/13   Historical Provider, MD  vitamin B-12 (CYANOCOBALAMIN) 1000 MCG tablet Take 1,000 mcg by mouth daily.      Historical Provider, MD  vitamin E 400 UNIT capsule Take 400 Units by mouth daily.      Historical Provider, MD   BP 177/68  Pulse 73  Temp(Src) 98.4 F (36.9 C) (Oral)  Resp 16  SpO2 100% Physical Exam  Nursing note and vitals reviewed. Constitutional: She is oriented to person, place, and time. She appears well-developed and well-nourished. No distress.  HENT:  Head: Normocephalic and atraumatic.  Mouth/Throat: Oropharynx is clear and moist.  Eyes: EOM are normal. Pupils are equal, round, and reactive to light.  Neck: Normal range of motion. Neck supple.  Cardiovascular: Normal rate and regular rhythm.  Exam reveals no friction rub.   No murmur heard. Pulmonary/Chest: Effort normal and breath sounds normal. No respiratory distress. She has no wheezes. She has no rales.  Abdominal: Soft. She exhibits no distension. There is no tenderness. There is no rebound.  Musculoskeletal: Normal range of motion. She exhibits no edema.       Cervical back: She exhibits no tenderness and no bony tenderness.       Thoracic back: She exhibits no tenderness and no bony tenderness.       Lumbar back: She exhibits normal range of motion, no tenderness and no bony tenderness.       Back:  Neurological: She is alert and oriented to person, place, and time.  Skin: She is not diaphoretic.    ED Course  Procedures (including critical care time) Labs Review Labs Reviewed - No data to display  Imaging Review No results found.   EKG Interpretation None      MDM   Final diagnoses:  Sciatica of left side    31F here with L sciatica. Present for 2 months, began after straining to have a bowel movement. No fevers, no vomiting, no saddle anesthesia, no LE weakness/numbness. Pain daily, pain with movement and standing. No urinary/stool incontinence/retention.  On exam,  good ambulation, good strength, reflexes intact. Will start on flexeril, will not start on narcotics since it constipation caused her issue in the first place. No luck with tramadol. Will not start valium since she lives alone.  Instructed to f/u with PCP and also to call PT for possible session for her L sided sciatica.  Stable for discharge.    Evelina Bucy, MD 10/12/13 1002

## 2013-10-14 ENCOUNTER — Ambulatory Visit
Admission: RE | Admit: 2013-10-14 | Discharge: 2013-10-14 | Disposition: A | Discharge status: Home / Self Care | Payer: Medicare Other | Source: Ambulatory Visit | Attending: Neurology | Admitting: Neurology

## 2013-10-14 DIAGNOSIS — M5416 Radiculopathy, lumbar region: Secondary | ICD-10-CM

## 2013-10-16 ENCOUNTER — Telehealth: Payer: Self-pay | Admitting: Neurology

## 2013-10-16 NOTE — Telephone Encounter (Signed)
Message copied by Annamaria Helling on Mon Oct 16, 2013  2:55 PM ------      Message from: TAT, Gholson      Created: Mon Oct 16, 2013  2:38 PM       Luvenia Starch, let pt know that she has acute/subacute fx of L1 vertebrae.  Think she has an upcoming appt with stern.  Kyphoplasty may be an option but she can discuss with him ------

## 2013-10-16 NOTE — Telephone Encounter (Signed)
Appt with Dr Hal Neer at [facility] on 10/18/2013 at 3:30 pm. Patient aware.

## 2013-10-16 NOTE — Telephone Encounter (Signed)
LMOM with [facility] - patient not yet scheduled for appt but in their system. Called to make sure they call patient with appt. Copy of MR faxed with confirmation received.

## 2013-10-16 NOTE — Telephone Encounter (Signed)
Patient made aware.

## 2013-10-30 ENCOUNTER — Encounter: Payer: Self-pay | Admitting: Internal Medicine

## 2013-10-30 ENCOUNTER — Ambulatory Visit (INDEPENDENT_AMBULATORY_CARE_PROVIDER_SITE_OTHER): Payer: Medicare Other | Admitting: Internal Medicine

## 2013-10-30 VITALS — BP 144/78 | HR 109 | Temp 97.8°F | Resp 14 | Wt 117.8 lb

## 2013-10-30 DIAGNOSIS — R030 Elevated blood-pressure reading, without diagnosis of hypertension: Secondary | ICD-10-CM

## 2013-10-30 DIAGNOSIS — K5909 Other constipation: Secondary | ICD-10-CM

## 2013-10-30 DIAGNOSIS — E785 Hyperlipidemia, unspecified: Secondary | ICD-10-CM

## 2013-10-30 DIAGNOSIS — M533 Sacrococcygeal disorders, not elsewhere classified: Secondary | ICD-10-CM

## 2013-10-30 MED ORDER — LACTULOSE 10 GM/15ML PO SOLN: 20.0000 g | Freq: Two times a day (BID) | ORAL | Status: DC | PRN

## 2013-10-30 NOTE — Progress Notes (Signed)
Pre visit review using our clinic review tool, if applicable. No additional management support is needed unless otherwise documented below in the visit note. 

## 2013-10-30 NOTE — Patient Instructions (Addendum)
You can try adding colace 100 mg twice daily to help soften your stools.  This can be taken every day.  I will refill the lactulose.  Stop the potato chips,  If the blood pressure  remains > 140/80,  Call for a medication     Hypertension Hypertension, commonly called high blood pressure, is when the force of blood pumping through your arteries is too strong. Your arteries are the blood vessels that carry blood from your heart throughout your body. A blood pressure reading consists of a higher number over a lower number, such as 110/72. The higher number (systolic) is the pressure inside your arteries when your heart pumps. The lower number (diastolic) is the pressure inside your arteries when your heart relaxes. Ideally you want your blood pressure below 120/80. Hypertension forces your heart to work harder to pump blood. Your arteries may become narrow or stiff. Having hypertension puts you at risk for heart disease, stroke, and other problems.  RISK FACTORS Some risk factors for high blood pressure are controllable. Others are not.  Risk factors you cannot control include:   Race. You may be at higher risk if you are African American.  Age. Risk increases with age.  Gender. Men are at higher risk than women before age 45 years. After age 45, women are at higher risk than men. Risk factors you can control include:  Not getting enough exercise or physical activity.  Being overweight.  Getting too much fat, sugar, calories, or salt in your diet.  Drinking too much alcohol. SIGNS AND SYMPTOMS Hypertension does not usually cause signs or symptoms. Extremely high blood pressure (hypertensive crisis) may cause headache, anxiety, shortness of breath, and nosebleed. DIAGNOSIS  To check if you have hypertension, your health care provider will measure your blood pressure while you are seated, with your arm held at the level of your heart. It should be measured at least twice using the same  arm. Certain conditions can cause a difference in blood pressure between your right and left arms. A blood pressure reading that is higher than normal on one occasion does not mean that you need treatment. If one blood pressure reading is high, ask your health care provider about having it checked again. TREATMENT  Treating high blood pressure includes making lifestyle changes and possibly taking medicine. Living a healthy lifestyle can help lower high blood pressure. You may need to change some of your habits. Lifestyle changes may include:  Following the DASH diet. This diet is high in fruits, vegetables, and whole grains. It is low in salt, red meat, and added sugars.  Getting at least 2 hours of brisk physical activity every week.  Losing weight if necessary.  Not smoking.  Limiting alcoholic beverages.  Learning ways to reduce stress. If lifestyle changes are not enough to get your blood pressure under control, your health care provider may prescribe medicine. You may need to take more than one. Work closely with your health care provider to understand the risks and benefits. HOME CARE INSTRUCTIONS  Have your blood pressure rechecked as directed by your health care provider.   Take medicines only as directed by your health care provider. Follow the directions carefully. Blood pressure medicines must be taken as prescribed. The medicine does not work as well when you skip doses. Skipping doses also puts you at risk for problems.   Do not smoke.   Monitor your blood pressure at home as directed by your health care provider. SEEK  MEDICAL CARE IF:   You think you are having a reaction to medicines taken.  You have recurrent headaches or feel dizzy.  You have swelling in your ankles.  You have trouble with your vision. SEEK IMMEDIATE MEDICAL CARE IF:  You develop a severe headache or confusion.  You have unusual weakness, numbness, or feel faint.  You have severe chest  or abdominal pain.  You vomit repeatedly.  You have trouble breathing. MAKE SURE YOU:   Understand these instructions.  Will watch your condition.  Will get help right away if you are not doing well or get worse. Document Released: 12/22/2004 Document Revised: 05/08/2013 Document Reviewed: 10/14/2012 [facility] Patient Information 2015 [place], [place]. This information is not intended to replace advice given to you by your health care provider. Make sure you discuss any questions you have with your health care provider.

## 2013-10-30 NOTE — Progress Notes (Signed)
Patient ID: [name], female   DOB: 10/14/1926, 80 y.o.   MRN: [id number]  Patient Active Problem List   Diagnosis Date Noted  . Essential and other specified forms of tremor 10/02/2013  . Constipation 08/12/2013  . Lethargy 07/22/2013  . Urinary frequency 05/29/2012  . Venous ulcer of left lower extremity without varicose veins 05/29/2012  . Nocturnal leg cramps 12/15/2011  . Screening for colon cancer 10/27/2011  . Screening for breast cancer 10/27/2011  . Degeneration of lumbar or lumbosacral intervertebral disc 09/01/2011  . Insomnia 01/07/2011  . Hyperlipidemia LDL goal <130 11/08/2010  . Back pain, sacroiliac 11/08/2010  . Anxiety     Subjective:  CC:   Chief Complaint  Patient presents with  . Acute Visit    Elevated blood pressure    HPI:   DONAE KUEKER is a 80 y.o. female who presents for follow up on multiple issues:   E1) vated Blood pressure readings by self measurement and recent measurement by Neurology. Last week was 190/80 during neurosurgical preprocedure evaluation.  She has been monitoring it at home and readings are averaging 409 to 811 systolic.  Diet and meds reviewed.  She has been eating large amounts of potato chips daily to initiate thirts.    2) Constipation.  She continues to have BM every 3rd day despite  using miralax twice daily and is requesting refill of  lactulose   3) back pain;  She received a steroid injection in the sacro iliac crest by Neurosurgery.  She states that after a few days she did finally experience a relief of pain, and has been advised to alter her physical activity and stop doing yardwork.  4) tremor:  She had a comprehensive neurology evaluation by dr, Hall Busing, which was reviewed with patient today.  NO medication changes occurred.    Past Medical History  Diagnosis Date  . Chronic orthostatic hypotension June 2012    with pprior syncopal event,  did not tolelrate florinef trial  . Anxiety   . Hyperlipidemia    . Depression   . Back pain     Past Surgical History  Procedure Laterality Date  . Ankle surgery Left   . Wrist surgery Right   . Cataract extraction, bilateral         The following portions of the patient's history were reviewed and updated as appropriate: Allergies, current medications, and problem list.    Review of Systems:   Patient denies headache, fevers, malaise, unintentional weight loss, skin rash, eye pain, sinus congestion and sinus pain, sore throat, dysphagia,  hemoptysis , cough, dyspnea, wheezing, chest pain, palpitations, orthopnea, edema, abdominal pain, nausea, melena, diarrhea, constipation, flank pain, dysuria, hematuria, urinary  Frequency, nocturia, numbness, tingling, seizures,  Focal weakness, Loss of consciousness,  Tremor, insomnia, depression, anxiety, and suicidal ideation.     History   Social History  . Marital Status: Married    Spouse Name: N/A    Number of Children: N/A  . Years of Education: N/A   Occupational History  . retired     Fish farm manager   Social History Main Topics  . Smoking status: Never Smoker   . Smokeless tobacco: Never Used  . Alcohol Use: No  . Drug Use: No  . Sexual Activity: Not Currently   Other Topics Concern  . Not on file   Social History Narrative  . No narrative on file    Objective:  Filed Vitals:   10/30/13 1426  BP: 144/78  Pulse: 109  Temp: 97.8 F (36.6 C)  Resp: 14     General appearance: alert, cooperative and appears stated age Ears: normal TM's and external ear canals both ears Throat: lips, mucosa, and tongue normal; teeth and gums normal Neck: no adenopathy, no carotid bruit, supple, symmetrical, trachea midline and thyroid not enlarged, symmetric, no tenderness/mass/nodules Back: symmetric, no curvature. ROM normal. No CVA tenderness. Lungs: clear to auscultation bilaterally Heart: regular rate and rhythm, S1, S2 normal, no murmur, click, rub or gallop Abdomen:  soft, non-tender; bowel sounds normal; no masses,  no organomegaly Pulses: 2+ and symmetric Skin: Skin color, texture, turgor normal. No rashes or lesions Lymph nodes: Cervical, supraclavicular, and axillary nodes normal.  Assessment and Plan:  Elevated blood pressure reading without diagnosis of hypertension advised her to stop eating potato chips and continue home  Monitoring,  Will add amlodipine 2.5 mg daily for BP consistently > 140/80  Hyperlipidemia LDL goal <130 No indicate for statins . Lab Results  Component Value Date   CHOL 175 07/20/2013   HDL 37.70* 07/20/2013   LDLCALC 122* 07/20/2013   LDLDIRECT 145.6 12/21/2012   TRIG 79.0 07/20/2013   CHOLHDL 5 07/20/2013     Back pain, sacroiliac Secondary to Spinal stenosis and, scoliosis with progression of disk bulging at L2-L3 level by repeat MRI 2014. Pain improved after N/S s/i joint steroid injection.    Constipation Lactulose prescribed for prn use.  continue daily miralax,  Add stool softener.     Updated Medication List Outpatient Encounter Prescriptions as of 10/30/2013  Medication Sig  . aspirin 81 MG tablet Take 81 mg by mouth daily.    . Black Cohosh (BLACK COHOSH HOT FLASH RELIEF) 40 MG CAPS Take 1 capsule by mouth as needed.   . clorazepate (TRANXENE) 7.5 MG tablet Take 1 tablet (7.5 mg total) by mouth at bedtime and may repeat dose one time if needed.  . fluticasone (FLONASE) 50 MCG/ACT nasal spray Place 2 sprays into both nostrils daily as needed.  . hydrOXYzine (VISTARIL) 25 MG capsule Take 1 capsule (25 mg total) by mouth 3 (three) times daily as needed.  . Iron-Vitamins (GERITOL PO) Take by mouth.    . lactulose (CHRONULAC) 10 GM/15ML solution Take 30 mLs (20 g total) by mouth 2 (two) times daily as needed for mild constipation. DO NOT USE MORE THAN ONCE A WEEK  . Lutein 20 MG CAPS Take 20 mg by mouth daily.    . magnesium oxide (MAG-OX) 400 MG tablet Take 400 mg by mouth daily.   [name] POTASSIUM GLUCONATE PO  Take by mouth.    . vitamin B-12 (CYANOCOBALAMIN) 1000 MCG tablet Take 1,000 mcg by mouth daily.    . vitamin E 400 UNIT capsule Take 400 Units by mouth daily.    . [DISCONTINUED] lactulose (CHRONULAC) 10 GM/15ML solution Take 30 mLs (20 g total) by mouth 2 (two) times daily as needed for mild constipation. DO NOT USE MORE THAN ONCE A WEEK  . docusate sodium (COLACE) 100 MG capsule Take 1 capsule (100 mg total) by mouth 2 (two) times daily.  . [DISCONTINUED] cyclobenzaprine (FLEXERIL) 10 MG tablet Take 1 tablet (10 mg total) by mouth 2 (two) times daily as needed for muscle spasms.  . [DISCONTINUED] mirtazapine (REMERON) 30 MG tablet   . [DISCONTINUED] predniSONE (DELTASONE) 10 MG tablet Take 3 tabs on days 1-3, take 2 tabs on days 4-6, take 1 tab on days 7-9  . [DISCONTINUED] traMADol (  ULTRAM) 50 MG tablet      No orders of the defined types were placed in this encounter.    Return in about 6 months (around 05/01/2014).

## 2013-10-31 MED ORDER — DOCUSATE SODIUM 100 MG PO CAPS: 100.0000 mg | ORAL_CAPSULE | Freq: Two times a day (BID) | ORAL | Status: DC

## 2013-10-31 NOTE — Assessment & Plan Note (Addendum)
advised her to stop eating potato chips and continue home  Monitoring,  Will add amlodipine 2.5 mg daily for BP consistently > 140/80

## 2013-10-31 NOTE — Assessment & Plan Note (Signed)
Lactulose prescribed for prn use.  continue daily miralax,  Add stool softener.

## 2013-10-31 NOTE — Assessment & Plan Note (Signed)
Secondary to Spinal stenosis and, scoliosis with progression of disk bulging at L2-L3 level by repeat MRI 2014. Pain improved after N/S s/i joint steroid injection.

## 2013-10-31 NOTE — Assessment & Plan Note (Signed)
No indicate for statins . Lab Results  Component Value Date   CHOL 175 07/20/2013   HDL 37.70* 07/20/2013   LDLCALC 122* 07/20/2013   LDLDIRECT 145.6 12/21/2012   TRIG 79.0 07/20/2013   CHOLHDL 5 07/20/2013

## 2013-11-13 ENCOUNTER — Ambulatory Visit (INDEPENDENT_AMBULATORY_CARE_PROVIDER_SITE_OTHER): Payer: Medicare Other | Admitting: Internal Medicine

## 2013-11-13 ENCOUNTER — Encounter: Payer: Self-pay | Admitting: Internal Medicine

## 2013-11-13 VITALS — BP 144/68 | HR 76 | Temp 97.6°F | Wt 116.5 lb

## 2013-11-13 DIAGNOSIS — I10 Essential (primary) hypertension: Secondary | ICD-10-CM

## 2013-11-13 NOTE — Progress Notes (Signed)
Pre visit review using our clinic review tool, if applicable. No additional management support is needed unless otherwise documented below in the visit note. 

## 2013-11-13 NOTE — Patient Instructions (Signed)

## 2013-11-13 NOTE — Progress Notes (Signed)
Subjective:    Patient ID: [name], female    DOB: March 16, 1926, 80 y.o.   MRN: [id number]  HPI  Pt presents to the clinic today with c/o elevated blood pressure. She reports that she has taken her blood pressure at home with her cuff and she got 190/90. She reports that she has not felt symptomatic. She denies chest pain, chest tightness, dizziness, or blurred vision. She does not take her blood pressure regularly. She is not sure what even prompted her to take it that day. She is not on blood pressure medication. At her last visit with Dr. Derrel Nip, BP was slightly elevated. She advised pt to start Norvasc 2.5 mg daily. The patient reports that she never did start this. She does not want to take any more medication than she has to. Of notet, she does have a history of orthostatic hypotension with syncopal episodes.   She also reports that she is here for her annual wellness visit.  Review of Systems  Past Medical History  Diagnosis Date  . Chronic orthostatic hypotension June 2012    with pprior syncopal event,  did not tolelrate florinef trial  . Anxiety   . Hyperlipidemia   . Depression   . Back pain     Current Outpatient Prescriptions  Medication Sig Dispense Refill  . aspirin 81 MG tablet Take 81 mg by mouth daily.      . Black Cohosh (BLACK COHOSH HOT FLASH RELIEF) 40 MG CAPS Take 1 capsule by mouth as needed.     . clorazepate (TRANXENE) 7.5 MG tablet Take 1 tablet (7.5 mg total) by mouth at bedtime and may repeat dose one time if needed. 60 tablet 3  . docusate sodium (COLACE) 100 MG capsule Take 1 capsule (100 mg total) by mouth 2 (two) times daily. 60 capsule 11  . fluticasone (FLONASE) 50 MCG/ACT nasal spray Place 2 sprays into both nostrils daily as needed.    [name] GARLIC PO Take 1 tablet by mouth daily.    . hydrOXYzine (VISTARIL) 25 MG capsule Take 1 capsule (25 mg total) by mouth 3 (three) times daily as needed. 90 capsule 11  . Iron-Vitamins (GERITOL PO) Take by  mouth.      . lactulose (CHRONULAC) 10 GM/15ML solution Take 30 mLs (20 g total) by mouth 2 (two) times daily as needed for mild constipation. DO NOT USE MORE THAN ONCE A WEEK 240 mL 0  . Lutein 20 MG CAPS Take 20 mg by mouth daily.      . magnesium oxide (MAG-OX) 400 MG tablet Take 400 mg by mouth daily.     [name] POTASSIUM GLUCONATE PO Take by mouth.      . vitamin B-12 (CYANOCOBALAMIN) 1000 MCG tablet Take 1,000 mcg by mouth daily.      . vitamin E 400 UNIT capsule Take 400 Units by mouth daily.       No current facility-administered medications for this visit.    No Known Allergies  Family History  Problem Relation Age of Onset  . Cancer Neg Hx   . Diabetes Neg Hx   . Heart disease Neg Hx     History   Social History  . Marital Status: Married    Spouse Name: N/A    Number of Children: N/A  . Years of Education: N/A   Occupational History  . retired     Fish farm manager   Social History Main Topics  . Smoking status: Never  Smoker   . Smokeless tobacco: Never Used  . Alcohol Use: No  . Drug Use: No  . Sexual Activity: Not Currently   Other Topics Concern  . Not on file   Social History Narrative     Constitutional: Denies fever, malaise, fatigue, headache or abrupt weight changes.  HEENT: Denies eye pain, eye redness, ear pain, ringing in the ears, wax buildup, runny nose, nasal congestion, bloody nose, or sore throat. Respiratory: Denies difficulty breathing, shortness of breath, cough or sputum production.   Cardiovascular: Denies chest pain, chest tightness, palpitations or swelling in the hands or feet.  Neurological: Denies dizziness, difficulty with memory, difficulty with speech.   No other specific complaints in a complete review of systems (except as listed in HPI above).     Objective:   Physical Exam  BP 144/68 mmHg  Pulse 76  Temp(Src) 97.6 F (36.4 C) (Oral)  Wt 116 lb 8 oz (52.844 kg)  SpO2 98%  Wt Readings from Last 3  Encounters:  11/13/13 116 lb 8 oz (52.844 kg)  10/30/13 117 lb 12 oz (53.411 kg)  10/02/13 118 lb 2 oz (53.581 kg)     General: Appears her stated age, well developed, well nourished in NAD. Cardiovascular: Normal rate and rhythm. S1,S2 noted.  No murmur, rubs or gallops noted. No JVD or BLE edema. No carotid bruits noted. Pulmonary/Chest: Normal effort and positive vesicular breath sounds. No respiratory distress. No wheezes, rales or ronchi noted.  Neurological: Alert and oriented. Resting tremor noted.  BMET    Component Value Date/Time   NA 136 07/20/2013 1221   K 4.6 07/20/2013 1221   CL 100 07/20/2013 1221   CO2 29 07/20/2013 1221   GLUCOSE 94 07/20/2013 1221   BUN 13 07/20/2013 1221   CREATININE 0.9 07/20/2013 1221   CALCIUM 9.2 07/20/2013 1221    Lipid Panel     Component Value Date/Time   CHOL 175 07/20/2013 1221   TRIG 79.0 07/20/2013 1221   HDL 37.70* 07/20/2013 1221   CHOLHDL 5 07/20/2013 1221   VLDL 15.8 07/20/2013 1221   LDLCALC 122* 07/20/2013 1221    CBC    Component Value Date/Time   WBC 12.1* 07/20/2013 1221   RBC 3.26* 07/20/2013 1221   HGB 10.2* 07/20/2013 1221   HCT 31.0* 07/20/2013 1221   PLT 383.0 07/20/2013 1221   MCV 95.0 07/20/2013 1221   MCHC 32.9 07/20/2013 1221   RDW 13.6 07/20/2013 1221   LYMPHSABS 1.5 07/20/2013 1221   MONOABS 0.9 07/20/2013 1221   EOSABS 0.1 07/20/2013 1221   BASOSABS 0.1 07/20/2013 1221    Hgb A1C No results found for: HGBA1C       Assessment & Plan:   HTN:  Discussed her BP today- over 140/80 She declines to start the Norvasc today- as Dr. Derrel Nip had suggested for BO > 140/80 She reports she would rather watch her diet- cut out salt, especially potato chips I told her that if she is going to monitor her BP at home, to get a new cuff because I do not think her cuff is accurate She should let us know if BP stays > 150/90- if so, she should probably start the Norvasc  I also advised her that her AWV  should be done by her PCP. She understands and agrees and will call her PCP to schedule appt

## 2013-11-14 ENCOUNTER — Telehealth: Payer: Self-pay | Admitting: Internal Medicine

## 2013-11-14 NOTE — Telephone Encounter (Signed)
emmi mailed  °

## 2013-12-18 ENCOUNTER — Telehealth: Payer: Self-pay | Admitting: Neurology

## 2013-12-18 NOTE — Telephone Encounter (Signed)
Spoke with patient - she states tremor has gotten worse since September and didn't know if Dr Tat would want to start her on medication. Made appt with patient so that Dr Tat can evaluate tremor in office and discuss possible medication management with patient at that time. She agrees with this plan.

## 2013-12-18 NOTE — Telephone Encounter (Signed)
Pt needs to ask a couple of questions please call [phone]

## 2013-12-20 ENCOUNTER — Telehealth: Payer: Self-pay | Admitting: Neurology

## 2013-12-20 NOTE — Telephone Encounter (Signed)
Pt canceled her f/u appt for tomorrow 12/21/13. Pt will call back to r/s.  She did not give a reason to why she was canceling.

## 2013-12-21 ENCOUNTER — Ambulatory Visit: Payer: Medicare Other | Admitting: Neurology

## 2014-01-09 DIAGNOSIS — H01003 Unspecified blepharitis right eye, unspecified eyelid: Secondary | ICD-10-CM | POA: Diagnosis not present

## 2014-01-24 ENCOUNTER — Other Ambulatory Visit: Payer: Self-pay | Admitting: Internal Medicine

## 2014-01-24 NOTE — Telephone Encounter (Signed)
Last visit 10/30/13, ok refill?

## 2014-01-24 NOTE — Telephone Encounter (Signed)
Ok to refill,  Authorized in epic 

## 2014-01-25 NOTE — Telephone Encounter (Signed)
Script faxed to pharmacy

## 2014-02-05 DIAGNOSIS — M5441 Lumbago with sciatica, right side: Secondary | ICD-10-CM | POA: Diagnosis not present

## 2014-02-05 DIAGNOSIS — M9904 Segmental and somatic dysfunction of sacral region: Secondary | ICD-10-CM | POA: Diagnosis not present

## 2014-02-05 DIAGNOSIS — M532X7 Spinal instabilities, lumbosacral region: Secondary | ICD-10-CM | POA: Diagnosis not present

## 2014-02-05 DIAGNOSIS — M9903 Segmental and somatic dysfunction of lumbar region: Secondary | ICD-10-CM | POA: Diagnosis not present

## 2014-02-06 DIAGNOSIS — L821 Other seborrheic keratosis: Secondary | ICD-10-CM | POA: Diagnosis not present

## 2014-02-06 DIAGNOSIS — L570 Actinic keratosis: Secondary | ICD-10-CM | POA: Diagnosis not present

## 2014-02-12 DIAGNOSIS — H01003 Unspecified blepharitis right eye, unspecified eyelid: Secondary | ICD-10-CM | POA: Diagnosis not present

## 2014-03-19 ENCOUNTER — Encounter: Payer: Self-pay | Admitting: Internal Medicine

## 2014-03-19 ENCOUNTER — Ambulatory Visit (INDEPENDENT_AMBULATORY_CARE_PROVIDER_SITE_OTHER): Payer: Medicare Other | Admitting: Internal Medicine

## 2014-03-19 VITALS — BP 140/62 | HR 84 | Temp 98.6°F | Resp 14 | Ht 66.0 in | Wt 125.5 lb

## 2014-03-19 DIAGNOSIS — M5137 Other intervertebral disc degeneration, lumbosacral region: Secondary | ICD-10-CM

## 2014-03-19 DIAGNOSIS — F419 Anxiety disorder, unspecified: Secondary | ICD-10-CM | POA: Diagnosis not present

## 2014-03-19 MED ORDER — DIAZEPAM 5 MG PO TABS: 5.0000 mg | ORAL_TABLET | Freq: Two times a day (BID) | ORAL | Status: DC | PRN

## 2014-03-19 NOTE — Progress Notes (Signed)
Pre-visit discussion using our clinic review tool. No additional management support is needed unless otherwise documented below in the visit note.  

## 2014-03-19 NOTE — Progress Notes (Signed)
Patient ID: [name], female   DOB: 1926-06-03, 81 y.o.   MRN: [id number]  Patient Active Problem List   Diagnosis Date Noted  . Essential and other specified forms of tremor 10/02/2013  . Constipation 08/12/2013  . Lethargy 07/22/2013  . Urinary frequency 05/29/2012  . Nocturnal leg cramps 12/15/2011  . Screening for colon cancer 10/27/2011  . Screening for breast cancer 10/27/2011  . Degeneration of lumbar or lumbosacral intervertebral disc 09/01/2011  . Insomnia 01/07/2011  . Hyperlipidemia LDL goal <130 11/08/2010  . Back pain, sacroiliac 11/08/2010  . Anxiety     Subjective:  CC:   Chief Complaint  Patient presents with  . Anxiety    HPI:   [name] is a 81 y.o. female who presents for  follow up on chronic issues including GAD and low back pain from DDD and spinal stenosis.  Back pain really bothering her .  Received an epidural steroid injection In January which was ineffective, and ds not want to get another one,because she is wary of the risks of paralysis .  She is using diazepam  5 mg daily and notes that she is able to stand a little while  Longer on the days she takes it in the morning .  She would like to have a prescription for it.  She is accompanied by her niece , who has been staying with her during the day and has not noticed any sedation with use of the valium .  Patient continues to maintain her own house and yard,  No recent falls. Has gained back a few lbs since her last visit, at which time she wasl grieving the loss of her husband.    Past Medical History  Diagnosis Date  . Chronic orthostatic hypotension June 2012    with pprior syncopal event,  did not tolelrate florinef trial  . Anxiety   . Hyperlipidemia   . Depression   . Back pain     Past Surgical History  Procedure Laterality Date  . Ankle surgery Left   . Wrist surgery Right   . Cataract extraction, bilateral         The following portions of the patient's history were  reviewed and updated as appropriate: Allergies, current medications, and problem list.    Review of Systems:   Patient denies headache, fevers, malaise, unintentional weight loss, skin rash, eye pain, sinus congestion and sinus pain, sore throat, dysphagia,  hemoptysis , cough, dyspnea, wheezing, chest pain, palpitations, orthopnea, edema, abdominal pain, nausea, melena, diarrhea, constipation, flank pain, dysuria, hematuria, urinary  Frequency, nocturia, numbness, tingling, seizures,  Focal weakness, Loss of consciousness,  Tremor, insomnia, depression, anxiety, and suicidal ideation.     History   Social History  . Marital Status: Married    Spouse Name: N/A  . Number of Children: N/A  . Years of Education: N/A   Occupational History  . retired     Fish farm manager   Social History Main Topics  . Smoking status: Never Smoker   . Smokeless tobacco: Never Used  . Alcohol Use: No  . Drug Use: No  . Sexual Activity: Not Currently   Other Topics Concern  . Not on file   Social History Narrative    Objective:  Filed Vitals:   03/19/14 1804  BP: 140/62  Pulse: 84  Temp: 98.6 F (37 C)  Resp: 14     General appearance: alert, cooperative, well groomed and appears stated age Neck:  no adenopathy, no carotid bruit, supple, symmetrical, trachea midline and thyroid not enlarged, symmetric, no tenderness/mass/nodules Back: symmetric, no curvature. ROM normal. No CVA tenderness. Lungs: clear to auscultation bilaterally Heart: regular rate and rhythm, S1, S2 normal, no murmur, click, rub or gallop Abdomen: soft, non-tender; bowel sounds normal; no masses,  no organomegaly Pulses: 2+ and symmetric Skin: Skin color, texture, turgor normal. No rashes or lesions Lymph nodes: Cervical, supraclavicular, and axillary nodes normal. Psych: affect normal, makes good eye contact. No fidgeting,  Smiles easily.  Denies suicidal thoughts   Assessment and Plan:  Degeneration  of lumbar or lumbosacral intervertebral disc She has had one epidural steroid injection January 2016 ,  Does not want another due to fear of complications/adverse events.  Tiral of diazepam per patient has helped her tolerate muscle spasm and allows her to function at home. The risks and benefits of benzodiazepine use were discussed with patient today including excessive sedation leading to respiratory depression,  impaired thinking/driving, and addiction.  Patient was advised to avoid concurrent use with alcohol, to use medication only as needed and not to share with others  .    Anxiety She did not tolerate buspirone, which has been stopped.  Primary symptom is chronic insomnia , managed with tranxene and hydroxyzine . No changes today     Updated Medication List Outpatient Encounter Prescriptions as of 03/19/2014  Medication Sig  . aspirin 81 MG tablet Take 81 mg by mouth daily.    . Black Cohosh (BLACK COHOSH HOT FLASH RELIEF) 40 MG CAPS Take 1 capsule by mouth as needed.   . clorazepate (TRANXENE) 7.5 MG tablet TAKE ONE (1) TABLET AT BEDTIME AND MAY REPEAT ONCE IF NEEDED  . diazepam (VALIUM) 5 MG tablet Take 1 tablet (5 mg total) by mouth every 12 (twelve) hours as needed for anxiety.  . docusate sodium (COLACE) 100 MG capsule Take 1 capsule (100 mg total) by mouth 2 (two) times daily. (Patient not taking: Reported on 03/19/2014)  . fluticasone (FLONASE) 50 MCG/ACT nasal spray Place 2 sprays into both nostrils daily as needed.  [name] GARLIC PO Take 1 tablet by mouth daily.  . hydrOXYzine (VISTARIL) 25 MG capsule Take 1 capsule (25 mg total) by mouth 3 (three) times daily as needed.  . Iron-Vitamins (GERITOL PO) Take by mouth.    . lactulose (CHRONULAC) 10 GM/15ML solution TAKE 30MLS(0NE OUNCE) BY MOUTH 2 TIMES DAILY AS NEEDED FOR MILD CONSTIPATION. DO NOT USE MORE THAN ONCE A WEEK  . Lutein 20 MG CAPS Take 20 mg by mouth daily.    . magnesium oxide (MAG-OX) 400 MG tablet Take 400 mg by mouth  daily.   [name] POTASSIUM GLUCONATE PO Take by mouth.    . vitamin B-12 (CYANOCOBALAMIN) 1000 MCG tablet Take 1,000 mcg by mouth daily.    . vitamin E 400 UNIT capsule Take 400 Units by mouth daily.       No orders of the defined types were placed in this encounter.    No Follow-up on file.

## 2014-03-19 NOTE — Patient Instructions (Signed)
I am prescribing diazepam as you requested to help you tolerate your back pain .  Do not take more than once a day.

## 2014-03-21 ENCOUNTER — Encounter: Payer: Self-pay | Admitting: Internal Medicine

## 2014-03-21 NOTE — Assessment & Plan Note (Signed)
She did not tolerate buspirone, which has been stopped.  Primary symptom is chronic insomnia , managed with tranxene and hydroxyzine . No changes today

## 2014-03-21 NOTE — Assessment & Plan Note (Signed)
She has had one epidural steroid injection January 2016 ,  Does not want another due to fear of complications/adverse events.  Tiral of diazepam per patient has helped her tolerate muscle spasm and allows her to function at home. The risks and benefits of benzodiazepine use were discussed with patient today including excessive sedation leading to respiratory depression,  impaired thinking/driving, and addiction.  Patient was advised to avoid concurrent use with alcohol, to use medication only as needed and not to share with others  .

## 2014-04-12 ENCOUNTER — Other Ambulatory Visit: Payer: Self-pay | Admitting: Internal Medicine

## 2014-04-12 MED ORDER — DIAZEPAM 5 MG PO TABS: 5.0000 mg | ORAL_TABLET | Freq: Every day | ORAL | Status: DC

## 2014-04-12 NOTE — Telephone Encounter (Signed)
I have refilled the diazepam and tell her that she can only take ONE PER DAY

## 2014-04-12 NOTE — Telephone Encounter (Signed)
Last OV and refill 3.14.16.  Please advise refill

## 2014-04-12 NOTE — Telephone Encounter (Signed)
Pt states she is only taking once daily, in the morning as previously directed. Faxed to pharmacy

## 2014-04-16 DIAGNOSIS — L570 Actinic keratosis: Secondary | ICD-10-CM | POA: Diagnosis not present

## 2014-04-16 DIAGNOSIS — L853 Xerosis cutis: Secondary | ICD-10-CM | POA: Diagnosis not present

## 2014-04-16 DIAGNOSIS — L821 Other seborrheic keratosis: Secondary | ICD-10-CM | POA: Diagnosis not present

## 2014-04-27 ENCOUNTER — Telehealth: Payer: Self-pay

## 2014-04-27 NOTE — Telephone Encounter (Signed)
Patient called [facility] for another patient FM.

## 2014-06-12 DIAGNOSIS — Z961 Presence of intraocular lens: Secondary | ICD-10-CM | POA: Diagnosis not present

## 2014-06-13 ENCOUNTER — Ambulatory Visit
Admission: RE | Admit: 2014-06-13 | Discharge: 2014-06-13 | Disposition: A | Discharge status: Home / Self Care | Payer: Medicare Other | Source: Ambulatory Visit | Attending: Internal Medicine | Admitting: Internal Medicine

## 2014-06-13 ENCOUNTER — Ambulatory Visit (INDEPENDENT_AMBULATORY_CARE_PROVIDER_SITE_OTHER): Payer: Medicare Other | Admitting: Internal Medicine

## 2014-06-13 ENCOUNTER — Encounter: Payer: Self-pay | Admitting: *Deleted

## 2014-06-13 ENCOUNTER — Other Ambulatory Visit: Payer: Self-pay | Admitting: Internal Medicine

## 2014-06-13 ENCOUNTER — Encounter: Payer: Self-pay | Admitting: Internal Medicine

## 2014-06-13 VITALS — BP 128/54 | HR 78 | Temp 97.8°F | Resp 14 | Ht 66.0 in | Wt 126.5 lb

## 2014-06-13 DIAGNOSIS — G8929 Other chronic pain: Secondary | ICD-10-CM

## 2014-06-13 DIAGNOSIS — M25552 Pain in left hip: Principal | ICD-10-CM

## 2014-06-13 DIAGNOSIS — Z79899 Other long term (current) drug therapy: Secondary | ICD-10-CM | POA: Diagnosis not present

## 2014-06-13 DIAGNOSIS — R636 Underweight: Secondary | ICD-10-CM

## 2014-06-13 DIAGNOSIS — M898X8 Other specified disorders of bone, other site: Secondary | ICD-10-CM | POA: Diagnosis not present

## 2014-06-13 DIAGNOSIS — M8580 Other specified disorders of bone density and structure, unspecified site: Secondary | ICD-10-CM | POA: Diagnosis not present

## 2014-06-13 DIAGNOSIS — M533 Sacrococcygeal disorders, not elsewhere classified: Secondary | ICD-10-CM | POA: Diagnosis not present

## 2014-06-13 DIAGNOSIS — R5383 Other fatigue: Secondary | ICD-10-CM | POA: Diagnosis not present

## 2014-06-13 DIAGNOSIS — M47816 Spondylosis without myelopathy or radiculopathy, lumbar region: Secondary | ICD-10-CM | POA: Insufficient documentation

## 2014-06-13 LAB — COMPREHENSIVE METABOLIC PANEL
ALT: 11 U/L (ref 0–35)
AST: 20 U/L (ref 0–37)
Albumin: 4.3 g/dL (ref 3.5–5.2)
Alkaline Phosphatase: 48 U/L (ref 39–117)
BUN: 12 mg/dL (ref 6–23)
CO2: 30 mEq/L (ref 19–32)
Calcium: 9.4 mg/dL (ref 8.4–10.5)
Chloride: 100 mEq/L (ref 96–112)
Creatinine, Ser: 0.84 mg/dL (ref 0.40–1.20)
GFR: 68.41 mL/min (ref >60.00)
Glucose, Bld: 92 mg/dL (ref 70–99)
Potassium: 4.2 mEq/L (ref 3.5–5.1)
Sodium: 132 mEq/L — ABNORMAL LOW (ref 135–145)
Total Bilirubin: 0.6 mg/dL (ref 0.2–1.2)
Total Protein: 7 g/dL (ref 6.0–8.3)

## 2014-06-13 LAB — CBC WITH DIFFERENTIAL/PLATELET
Basophils Absolute: 0 10*3/uL (ref 0.0–0.1)
Basophils Relative: 0.7 % (ref 0.0–3.0)
Eosinophils Absolute: 0.1 10*3/uL (ref 0.0–0.7)
Eosinophils Relative: 1.7 % (ref 0.0–5.0)
HCT: 37.3 % (ref 36.0–46.0)
Hemoglobin: 12.6 g/dL (ref 12.0–15.0)
Lymphocytes Relative: 30 % (ref 12.0–46.0)
Lymphs Abs: 1.9 10*3/uL (ref 0.7–4.0)
MCHC: 33.9 g/dL (ref 30.0–36.0)
MCV: 96.8 fl (ref 78.0–100.0)
Monocytes Absolute: 0.5 10*3/uL (ref 0.1–1.0)
Monocytes Relative: 8.5 % (ref 3.0–12.0)
Neutro Abs: 3.8 10*3/uL (ref 1.4–7.7)
Neutrophils Relative %: 59.1 % (ref 43.0–77.0)
Platelets: 264 10*3/uL (ref 150.0–400.0)
RBC: 3.85 Mil/uL — ABNORMAL LOW (ref 3.87–5.11)
RDW: 13.1 % (ref 11.5–15.5)
WBC: 6.4 10*3/uL (ref 4.0–10.5)

## 2014-06-13 LAB — IBC PANEL
Iron: 94 ug/dL (ref 42–145)
Saturation Ratios: 32.4 % (ref 20.0–50.0)
Transferrin: 207 mg/dL — ABNORMAL LOW (ref 212.0–360.0)

## 2014-06-13 LAB — TSH: TSH: 1.43 u[IU]/mL (ref 0.35–4.50)

## 2014-06-13 MED ORDER — DIAZEPAM 5 MG PO TABS: 5.0000 mg | ORAL_TABLET | Freq: Two times a day (BID) | ORAL | Status: DC | PRN

## 2014-06-13 NOTE — Assessment & Plan Note (Signed)
Suspect Sacral insufficiency fracture given history.  Has muscle spasm and scoliosis  On exam.  Palin films of hip and pelvis ordered .  Trial of diazepam , tylenol and motrin of control.  Pt eval if no fractures seen

## 2014-06-13 NOTE — Patient Instructions (Addendum)
Your pain may be coming from a slight fracture in your pelvis or from bursitis in the hip   Try the following combination for your pain:   Diazepam 5 mg for muscle spasm,  Motrin 600 mg   And tylenol 650 mg   Twice daily  Take this combination   X rays today  PT referral if no fractures

## 2014-06-13 NOTE — Addendum Note (Signed)
Addended by: Crecencio Mc on: 06/13/2014 03:43 PM   Modules accepted: Orders

## 2014-06-13 NOTE — Progress Notes (Signed)
Pre-visit discussion using our clinic review tool. No additional management support is needed unless otherwise documented below in the visit note.  

## 2014-06-13 NOTE — Progress Notes (Addendum)
Subjective:  Patient ID: [name], female    DOB: January 01, 1927  Age: 81 y.o. MRN: [id number]  CC: The primary encounter diagnosis was Hip pain, chronic, left. Diagnoses of Other fatigue, Chronic left hip pain, Underweight, and Back pain, sacroiliac were also pertinent to this visit.  HPI [name] presents for left sided sacroiliac and posterolateral hip pain.  Started 4 months ago with a "pop" while straining on the commode.  Pain is brought on with walking and prolonged standing and radiates down leg.  Convinced she injured hip during defecation/straining .      Outpatient Prescriptions Prior to Visit  Medication Sig Dispense Refill  . aspirin 81 MG tablet Take 81 mg by mouth daily.      . Black Cohosh (BLACK COHOSH HOT FLASH RELIEF) 40 MG CAPS Take 1 capsule by mouth as needed.     . clorazepate (TRANXENE) 7.5 MG tablet TAKE ONE (1) TABLET AT BEDTIME AND MAY REPEAT ONCE IF NEEDED 60 tablet 3  . docusate sodium (COLACE) 100 MG capsule Take 1 capsule (100 mg total) by mouth 2 (two) times daily. 60 capsule 11  . GARLIC PO Take 1 tablet by mouth daily.    . hydrOXYzine (VISTARIL) 25 MG capsule Take 1 capsule (25 mg total) by mouth 3 (three) times daily as needed. 90 capsule 11  . Iron-Vitamins (GERITOL PO) Take by mouth.      . lactulose (CHRONULAC) 10 GM/15ML solution TAKE 30MLS(0NE OUNCE) BY MOUTH 2 TIMES DAILY AS NEEDED FOR MILD CONSTIPATION. DO NOT USE MORE THAN ONCE A WEEK 240 mL 2  . Lutein 20 MG CAPS Take 20 mg by mouth daily.      . magnesium oxide (MAG-OX) 400 MG tablet Take 400 mg by mouth daily.     [name] POTASSIUM GLUCONATE PO Take by mouth.      . vitamin B-12 (CYANOCOBALAMIN) 1000 MCG tablet Take 1,000 mcg by mouth daily.      . vitamin E 400 UNIT capsule Take 400 Units by mouth daily.      . diazepam (VALIUM) 5 MG tablet Take 1 tablet (5 mg total) by mouth daily. As needed for muscle spasm 30 tablet 1  . fluticasone (FLONASE) 50 MCG/ACT nasal spray Place 2 sprays into  both nostrils daily as needed.     No facility-administered medications prior to visit.    Review of Systems;  Patient denies headache, fevers, malaise, unintentional weight loss, skin rash, eye pain, sinus congestion and sinus pain, sore throat, dysphagia,  hemoptysis , cough, dyspnea, wheezing, chest pain, palpitations, orthopnea, edema, abdominal pain, nausea, melena, diarrhea, constipation, flank pain, dysuria, hematuria, urinary  Frequency, nocturia, numbness, tingling, seizures,  Focal weakness, Loss of consciousness,  Tremor, insomnia, depression, anxiety, and suicidal ideation.      Objective:  BP 128/54 mmHg  Pulse 78  Temp(Src) 97.8 F (36.6 C) (Oral)  Resp 14  Ht 5\' 6"  (1.676 m)  Wt 126 lb 8 oz (57.38 kg)  BMI 20.43 kg/m2  SpO2 99%  BP Readings from Last 3 Encounters:  06/13/14 128/54  03/19/14 140/62  11/13/13 144/68    Wt Readings from Last 3 Encounters:  06/13/14 126 lb 8 oz (57.38 kg)  03/19/14 125 lb 8 oz (56.926 kg)  11/13/13 116 lb 8 oz (52.844 kg)    General appearance: alert, cooperative and appears stated age Ears: normal TM's and external ear canals both ears Throat: lips, mucosa, and tongue normal; teeth and gums normal  Neck: no adenopathy, no carotid bruit, supple, symmetrical, trachea midline and thyroid not enlarged, symmetric, no tenderness/mass/nodules Back: scoliotic,  ROM normal. No CVA tenderness. Muscle spasm and SI joint tenderness on left side.. Lungs: clear to auscultation bilaterally Heart: regular rate and rhythm, S1, S2 normal, no murmur, click, rub or gallop Abdomen: soft, non-tender; bowel sounds normal; no masses,  no organomegaly Pulses: 2+ and symmetric Skin: Skin color, texture, turgor normal. No rashes or lesions MSK: normal ROM left hip, negative straight leg lift  Lymph nodes: Cervical, supraclavicular, and axillary nodes normal.  No results found for: HGBA1C  Lab Results  Component Value Date   CREATININE 0.84  06/13/2014   CREATININE 0.9 07/20/2013   CREATININE 0.9 12/21/2012    Lab Results  Component Value Date   WBC 6.4 06/13/2014   HGB 12.6 06/13/2014   HCT 37.3 06/13/2014   PLT 264.0 06/13/2014   GLUCOSE 92 06/13/2014   CHOL 175 07/20/2013   TRIG 79.0 07/20/2013   HDL 37.70* 07/20/2013   LDLDIRECT 145.6 12/21/2012   LDLCALC 122* 07/20/2013   ALT 11 06/13/2014   AST 20 06/13/2014   NA 132* 06/13/2014   K 4.2 06/13/2014   CL 100 06/13/2014   CREATININE 0.84 06/13/2014   BUN 12 06/13/2014   CO2 30 06/13/2014   TSH 1.43 06/13/2014    Mr Lumbar Spine Wo Contrast  10/14/2013   CLINICAL DATA:  81 year old female with low back pain radiating to the left lower extremity. Left sacral pain. No known injury. Initial encounter.  EXAM: MRI LUMBAR SPINE WITHOUT CONTRAST  TECHNIQUE: Multiplanar, multisequence MR imaging of the lumbar spine was performed. No intravenous contrast was administered.  COMPARISON:  [facility] Lumbar radiographs 07/20/2013, and Lumbar MRI 03/09/2012.  FINDINGS: Normal lumbar segmentation depicted on 07/20/2013. Chronic dextro convex lumbar scoliosis associated with reversal of lordosis.  L1 compression fracture with deformity primarily affecting the superior endplate. Loss of height up to 40%. Associated marrow edema (series 4, image 9). Mild retropulsion of the posterior superior endplate. No marrow edema tracking into the pedicles or posterior elements. Signal changes in the adjacent T12-L1 disc are likely posttraumatic.  The visible lower thoracic levels remain intact. Other lumbar levels appear stable and intact ; interval regressed L3-L4 endplate marrow edema which appeared to be degenerative in nature on the prior study.  Visible sacrum intact.  Visualized lower thoracic spinal cord is normal with conus medularis at T12.  Stable visualized abdominal viscera.  Preexisting mild degenerative spinal stenosis at T12-L1 has not significantly changed. Mild  to moderate left T12 foraminal stenosis has increased.  Moderate degenerative spinal stenosis at L1-L2 is stable. Moderate to severe degenerative right lateral recess and right foraminal stenosis at L2-L3 are stable. Moderate to severe right lateral recess and right L3 foraminal stenosis with Mild overall spinal stenosis at L3-L4 are stable. Moderate to severe left lateral recess stenosis and left foraminal stenosis at L4-L5 are stable. Moderate left L5 foraminal stenosis is stable.  IMPRESSION: 1. Acute to subacute moderate L1 compression fracture. Mild retropulsion of the posterior superior endplate, but no significant exacerbation of preexisting degenerative mild T12-L1 spinal stenosis. 2. No other acute osseous abnormality.  Visible sacrum intact. 3. Stable chronic multilevel moderate to severe degenerative lateral recess and foraminal stenosis in the lumbar spine in the setting of chronic scoliosis and reversed lordosis.   Electronically Signed   By: Lars Pinks M.D.   On: 10/14/2013 12:18    Assessment & Plan:  Problem List Items Addressed This Visit    Back pain, sacroiliac    Plain films of pelvis done to rule out SI joint fracture .  Films were negative. Referral to [facility] PT  Pelvic and hip films on left were normal.       Relevant Orders   Ambulatory referral to Physical Therapy   Underweight    Patient's weight is stable.  I have reviewed her diet and recommended that she increase her protein and fat intake while monitoring her carbohydrates.       Chronic left hip pain    Ruled out  sacral insufficiency fracture given history.  Has muscle spasm and scoliosis  On exam.  Palin films of hip and pelvis normal.  Trial of diazepam , tylenol and motrin of contrlo, and  Pt eval       Other Visit Diagnoses    Hip pain, chronic, left    -  Primary    Relevant Orders    Ambulatory referral to Physical Therapy    Other fatigue        Relevant Orders    IBC panel (Completed)    CBC with  Differential/Platelet (Completed)    TSH (Completed)    Comprehensive metabolic panel (Completed)       I have changed [name]'s diazepam. I am also having her maintain her aspirin, Iron-Vitamins (GERITOL PO), Lutein, POTASSIUM GLUCONATE PO, magnesium oxide, vitamin E, vitamin B-12, Black Cohosh, hydrOXYzine, fluticasone, docusate sodium, GARLIC PO, lactulose, and clorazepate.  Meds ordered this encounter  Medications  . diazepam (VALIUM) 5 MG tablet    Sig: Take 1 tablet (5 mg total) by mouth every 12 (twelve) hours as needed for anxiety. As needed for muscle spasm    Dispense:  60 tablet    Refill:  2    Medications Discontinued During This Encounter  Medication Reason  . diazepam (VALIUM) 5 MG tablet Reorder    Follow-up: No Follow-up on file.   Crecencio Mc, MD

## 2014-06-15 DIAGNOSIS — R636 Underweight: Secondary | ICD-10-CM | POA: Insufficient documentation

## 2014-06-15 NOTE — Assessment & Plan Note (Addendum)
Plain films of pelvis done to rule out SI joint fracture .  Films were negative. Referral to [facility] PT  Pelvic and hip films on left were normal.

## 2014-06-15 NOTE — Addendum Note (Signed)
Addended by: Crecencio Mc on: 06/15/2014 08:31 AM   Modules accepted: Orders

## 2014-06-15 NOTE — Assessment & Plan Note (Signed)
Patient's weight is stable.  I have reviewed her diet and recommended that she increase her protein and fat intake while monitoring her carbohydrates.

## 2014-06-29 DIAGNOSIS — M545 Low back pain: Secondary | ICD-10-CM | POA: Diagnosis not present

## 2014-07-03 DIAGNOSIS — M545 Low back pain: Secondary | ICD-10-CM | POA: Diagnosis not present

## 2014-07-05 DIAGNOSIS — M545 Low back pain: Secondary | ICD-10-CM | POA: Diagnosis not present

## 2014-07-10 DIAGNOSIS — M545 Low back pain: Secondary | ICD-10-CM | POA: Diagnosis not present

## 2014-07-12 ENCOUNTER — Encounter: Payer: Self-pay | Admitting: Internal Medicine

## 2014-07-13 DIAGNOSIS — M545 Low back pain: Secondary | ICD-10-CM | POA: Diagnosis not present

## 2014-07-16 DIAGNOSIS — M545 Low back pain: Secondary | ICD-10-CM | POA: Diagnosis not present

## 2014-07-23 DIAGNOSIS — M545 Low back pain: Secondary | ICD-10-CM | POA: Diagnosis not present

## 2014-07-25 DIAGNOSIS — M545 Low back pain: Secondary | ICD-10-CM | POA: Diagnosis not present

## 2014-07-26 DIAGNOSIS — L508 Other urticaria: Secondary | ICD-10-CM | POA: Diagnosis not present

## 2014-07-26 DIAGNOSIS — L570 Actinic keratosis: Secondary | ICD-10-CM | POA: Diagnosis not present

## 2014-07-30 DIAGNOSIS — M545 Low back pain: Secondary | ICD-10-CM | POA: Diagnosis not present

## 2014-08-02 DIAGNOSIS — M545 Low back pain: Secondary | ICD-10-CM | POA: Diagnosis not present

## 2014-09-03 ENCOUNTER — Other Ambulatory Visit: Payer: Self-pay | Admitting: Internal Medicine

## 2014-09-11 ENCOUNTER — Ambulatory Visit (INDEPENDENT_AMBULATORY_CARE_PROVIDER_SITE_OTHER): Payer: Medicare Other | Admitting: Internal Medicine

## 2014-09-11 ENCOUNTER — Encounter: Payer: Self-pay | Admitting: Internal Medicine

## 2014-09-11 ENCOUNTER — Telehealth: Payer: Self-pay | Admitting: *Deleted

## 2014-09-11 VITALS — BP 138/70 | HR 64 | Temp 98.0°F | Wt 129.0 lb

## 2014-09-11 DIAGNOSIS — M5137 Other intervertebral disc degeneration, lumbosacral region: Secondary | ICD-10-CM

## 2014-09-11 DIAGNOSIS — M545 Low back pain, unspecified: Secondary | ICD-10-CM

## 2014-09-11 DIAGNOSIS — G8929 Other chronic pain: Secondary | ICD-10-CM

## 2014-09-11 DIAGNOSIS — M25552 Pain in left hip: Secondary | ICD-10-CM | POA: Diagnosis not present

## 2014-09-11 DIAGNOSIS — K5900 Constipation, unspecified: Secondary | ICD-10-CM

## 2014-09-11 NOTE — Assessment & Plan Note (Signed)
Advised to use colace and metamucil daily , stimulant laxatives not more than twice weekly.

## 2014-09-11 NOTE — Assessment & Plan Note (Signed)
Managed with diazepam for muscle spasm. Prior neurosurgical evaluation

## 2014-09-11 NOTE — Assessment & Plan Note (Signed)
Recurrent due to lapse in stretching exercises,  referring to PT

## 2014-09-11 NOTE — Telephone Encounter (Signed)
2:15 today is fine thanks!

## 2014-09-11 NOTE — Telephone Encounter (Signed)
Since 2:15 appt has been canceled by patient, please call patient and schedule them in this time slot. Thanks!

## 2014-09-11 NOTE — Telephone Encounter (Signed)
Patient has requested to see Dr.Tullo today. Please advise where to place her on the schedule this afternoon.-Thanks

## 2014-09-11 NOTE — Telephone Encounter (Signed)
Your 2:15 appt has been canceled. Would that be an appropriate slot to schedule this patient?

## 2014-09-11 NOTE — Progress Notes (Signed)
Pre visit review using our clinic review tool, if applicable. No additional management support is needed unless otherwise documented below in the visit note. 

## 2014-09-11 NOTE — Progress Notes (Signed)
Subjective:  Patient ID: [name], female    DOB: 1926-10-31  Age: 81 y.o. MRN: [id number]  CC: The primary encounter diagnosis was Left-sided low back pain without sciatica. Diagnoses of Chronic left hip pain, Degeneration of lumbar or lumbosacral intervertebral disc, and Constipation, unspecified constipation type were also pertinent to this visit.  HPI [name] presents for left sided pain ,  Constipation despite use of colace and lactulose .  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 aspirin 81 MG tablet Take 81 mg by mouth daily.      . Black Cohosh (BLACK COHOSH HOT FLASH RELIEF) 40 MG CAPS Take 1 capsule by mouth as needed.     . clorazepate (TRANXENE) 7.5 MG tablet TAKE ONE TABLET AT BEDTIME MAY REPEAT ONCE IF NEEDED 60 tablet 3  . diazepam (VALIUM) 5 MG tablet Take 1 tablet (5 mg total) by mouth every 12 (twelve) hours as needed for anxiety. As needed for muscle spasm 60 tablet 2  . fluticasone (FLONASE) 50 MCG/ACT nasal spray Place 2 sprays into both nostrils daily as needed.    [name] GARLIC PO Take 1 tablet by mouth daily.    . hydrOXYzine (ATARAX/VISTARIL) 25 MG tablet TAKE ONE (1) CAPSULE THREE (3) TIMES EACH DAY AS NEEDED 90 tablet 3  . Iron-Vitamins (GERITOL PO) Take by mouth.      . lactulose (CHRONULAC) 10 GM/15ML solution TAKE 30MLS(0NE OUNCE) BY MOUTH 2 TIMES DAILY AS NEEDED FOR MILD CONSTIPATION. DO NOT USE MORE THAN ONCE A WEEK 240 mL 2  . Lutein 20 MG CAPS Take 20 mg by mouth daily.      . magnesium oxide (MAG-OX) 400 MG tablet Take 400 mg by mouth daily.     [name] POTASSIUM GLUCONATE PO Take by  mouth.      . vitamin B-12 (CYANOCOBALAMIN) 1000 MCG tablet Take 1,000 mcg by mouth daily.      . vitamin E 400 UNIT capsule Take 400 Units by mouth daily.      [name] docusate sodium (COLACE) 100 MG capsule Take 1 capsule (100 mg total) by mouth 2 (two) times daily. (Patient not taking: Reported on 09/11/2014) 60 capsule 11  . hydrOXYzine (VISTARIL) 25 MG capsule Take 1 capsule (25 mg total) by mouth 3 (three) times daily as needed. (Patient not taking: Reported on 09/11/2014) 90 capsule 11   No facility-administered medications prior to visit.    Review of Systems;  Patient denies headache, fevers, malaise, unintentional weight loss, skin rash, eye pain, sinus congestion and sinus pain, sore throat, dysphagia,  hemoptysis , cough, dyspnea, wheezing, chest pain, palpitations, orthopnea, edema, abdominal pain, nausea, melena, diarrhea, constipation, flank pain, dysuria, hematuria, urinary  Frequency, nocturia, numbness, tingling, seizures,  Focal weakness, Loss of consciousness,  Tremor, insomnia, depression, anxiety, and suicidal ideation.      Objective:  BP 138/70 mmHg  Pulse 64  Temp(Src) 98 F (36.7 C) (Oral)  Wt 129 lb (58.514 kg)  BP Readings from Last 3 Encounters:  09/11/14 138/70  06/13/14 128/54  03/19/14 140/62  Wt Readings from Last 3 Encounters:  09/11/14 129 lb (58.514 kg)  06/13/14 126 lb 8 oz (57.38 kg)  03/19/14 125 lb 8 oz (56.926 kg)    General appearance: alert, cooperative and appears stated age Ears: normal TM's and external ear canals both ears Throat: lips, mucosa, and tongue normal; teeth and gums normal Neck: no adenopathy, no carotid bruit, supple, symmetrical, trachea midline and thyroid not enlarged, symmetric, no tenderness/mass/nodules Back: symmetric, no curvature. ROM normal. No CVA tenderness. Lungs: clear to auscultation bilaterally Heart: regular rate and rhythm, S1, S2 normal, no murmur, click, rub or gallop Abdomen: soft, non-tender; bowel  sounds normal; no masses,  no organomegaly Pulses: 2+ and symmetric Skin: Skin color, texture, turgor normal. No rashes or lesions Lymph nodes: Cervical, supraclavicular, and axillary nodes normal.  No results found for: HGBA1C  Lab Results  Component Value Date   CREATININE 0.84 06/13/2014   CREATININE 0.9 07/20/2013   CREATININE 0.9 12/21/2012    Lab Results  Component Value Date   WBC 6.4 06/13/2014   HGB 12.6 06/13/2014   HCT 37.3 06/13/2014   PLT 264.0 06/13/2014   GLUCOSE 92 06/13/2014   CHOL 175 07/20/2013   TRIG 79.0 07/20/2013   HDL 37.70* 07/20/2013   LDLDIRECT 145.6 12/21/2012   LDLCALC 122* 07/20/2013   ALT 11 06/13/2014   AST 20 06/13/2014   NA 132* 06/13/2014   K 4.2 06/13/2014   CL 100 06/13/2014   CREATININE 0.84 06/13/2014   BUN 12 06/13/2014   CO2 30 06/13/2014   TSH 1.43 06/13/2014    Dg Hip Unilat With Pelvis 2-3 Views Left  06/13/2014   CLINICAL DATA:  Popping sensation in the left hip 1 year ago with persistent pain ever since. Pain with movement. Initial encounter.  EXAM: LEFT HIP (WITH PELVIS) 2-3 VIEWS  COMPARISON:  None.  FINDINGS: Osteopenia. No acute or healing fracture. No degenerative changes in the hips. Obturator rings are intact. Degenerative changes are seen in the visualized portion of the lumbar spine.  IMPRESSION: 1. No findings in the left hip to explain the patient's pain. 2. Lumbar spondylosis. 3. Osteopenia.   Electronically Signed   By: Lorin Picket M.D.   On: 06/13/2014 16:14    Assessment & Plan:   Problem List Items Addressed This Visit      Unprioritized   Degeneration of lumbar or lumbosacral intervertebral disc    Managed with diazepam for muscle spasm. Prior neurosurgical evaluation       Constipation    Advised to use colace and metamucil daily , stimulant laxatives not more than twice weekly.       Chronic left hip pain    Recurrent due to lapse in stretching exercises,  referring to PT        Other Visit  Diagnoses    Left-sided low back pain without sciatica    -  Primary    Relevant Orders    Ambulatory referral to Physical Therapy       I am having [name] maintain her aspirin, Iron-Vitamins (GERITOL PO), Lutein, POTASSIUM GLUCONATE PO, magnesium oxide, vitamin E, vitamin B-12, Black Cohosh, hydrOXYzine, fluticasone, docusate sodium, GARLIC PO, lactulose, diazepam, hydrOXYzine, and clorazepate.  No orders of the defined types were placed in this encounter.    There are no discontinued medications.  Follow-up: No Follow-up on file.   Crecencio Mc, MD

## 2014-09-11 NOTE — Patient Instructions (Signed)
You need to increase the water in your diet  To 3 16 ounce bottles daily   You can take  metamucil and colace (200 mg ) EVERY DAY  Save the Equate for every 3rd day if needed   You should not use the diazepam more than once daily  Please resume your stretching exercises daily,  Referral to [facility] is under way

## 2014-09-18 DIAGNOSIS — M9903 Segmental and somatic dysfunction of lumbar region: Secondary | ICD-10-CM | POA: Diagnosis not present

## 2014-09-18 DIAGNOSIS — M5441 Lumbago with sciatica, right side: Secondary | ICD-10-CM | POA: Diagnosis not present

## 2014-09-18 DIAGNOSIS — M532X7 Spinal instabilities, lumbosacral region: Secondary | ICD-10-CM | POA: Diagnosis not present

## 2014-09-18 DIAGNOSIS — M9904 Segmental and somatic dysfunction of sacral region: Secondary | ICD-10-CM | POA: Diagnosis not present

## 2014-09-20 DIAGNOSIS — M5416 Radiculopathy, lumbar region: Secondary | ICD-10-CM | POA: Diagnosis not present

## 2014-09-20 DIAGNOSIS — M25552 Pain in left hip: Secondary | ICD-10-CM | POA: Diagnosis not present

## 2014-09-20 DIAGNOSIS — M545 Low back pain: Secondary | ICD-10-CM | POA: Diagnosis not present

## 2014-09-21 ENCOUNTER — Telehealth: Payer: Self-pay | Admitting: Internal Medicine

## 2014-09-21 NOTE — Telephone Encounter (Signed)
Pt called about wanting to get a MRI and see what's going on with her back. Pt states before she went through 8 treatments and it got better and now it's back and the therapist does not understand why? Thank You!

## 2014-09-21 NOTE — Telephone Encounter (Signed)
She will have to make an appoint,ent .  I will not order an MRI based on that vague information

## 2014-09-21 NOTE — Telephone Encounter (Signed)
Please advise 

## 2014-09-24 ENCOUNTER — Telehealth: Payer: Self-pay | Admitting: *Deleted

## 2014-09-24 NOTE — Telephone Encounter (Signed)
I  will not  order imaging  without more specific symptoms.  If the pain is not radiating down  either leg,  She does not need an MRI.  It the pain  suddenly got worse in one area in her back, she may have a fracture and needs plain films,  This is why i asked the patientto make an appt bc the message was not specific enough.

## 2014-09-24 NOTE — Telephone Encounter (Signed)
She has an appointment with you scheduled on 9/20 (tomorrow)

## 2014-09-24 NOTE — Telephone Encounter (Signed)
Patient scheduled.

## 2014-09-24 NOTE — Telephone Encounter (Signed)
Patient stated she does not need to be seen if she can get an MRI, Patient stated PT helped for about 2 weeks but know back is worse than before PT. Patient is requesting CT or MRI for back, patient is willing to come to office for an appointment if needed.

## 2014-09-24 NOTE — Telephone Encounter (Signed)
Patient has requested to be seen by Dr. Derrel Nip today. Please advise were to place her on the schedule. Patient has also requested to have a MRI . -thanks

## 2014-09-25 ENCOUNTER — Encounter: Payer: Self-pay | Admitting: Internal Medicine

## 2014-09-25 ENCOUNTER — Ambulatory Visit (INDEPENDENT_AMBULATORY_CARE_PROVIDER_SITE_OTHER): Payer: Medicare Other | Admitting: Internal Medicine

## 2014-09-25 VITALS — BP 146/82 | HR 70 | Temp 98.0°F | Resp 14 | Ht 66.0 in | Wt 130.0 lb

## 2014-09-25 DIAGNOSIS — M5137 Other intervertebral disc degeneration, lumbosacral region: Secondary | ICD-10-CM

## 2014-09-25 DIAGNOSIS — M533 Sacrococcygeal disorders, not elsewhere classified: Secondary | ICD-10-CM

## 2014-09-25 NOTE — Progress Notes (Signed)
Pre-visit discussion using our clinic review tool. No additional management support is needed unless otherwise documented below in the visit note.  

## 2014-09-25 NOTE — Patient Instructions (Addendum)
We will order the MRI of your lumbar spine to investigate  Your pain  We will also make a referral to Dr Sharlet Salina to see if he has other ways to treat your pain

## 2014-09-25 NOTE — Progress Notes (Signed)
Subjective:  Patient ID: [name], female    DOB: 1926-09-30  Age: 81 y.o. MRN: [id number]  CC: The primary encounter diagnosis was Degeneration of lumbar or lumbosacral intervertebral disc. A diagnosis of Back pain, sacroiliac was also pertinent to this visit.  HPI Mariany B Toran presents for worsening low back pain .  She has had chronic low back pain radiating to left gluteal region for over 6 months with no signs of fracture. Has been getting PT  At [facility] which as involved stretching which improved her  Pain to a tolerable level , and has  Continued the stretching on her own when her therapist went on vacation. During his absence the pain has returned with walking . Occasionally has pain that shoots down the left leg,  But not always, but her baseline level of pain now making it difficult to walk.   Pain began 81yr 4 months ago and she has attributed it to an episode of constipation  when she had to strain very hard to move her bowels and she felt and heard something fgo "pop."  No priror MRI.  has never completely resolved, .  Now taking 3 motrin and 2 tylnol and pian is moderate to severe  worried about becoming constipated with anything stronger  Taking sennalot twice daily and metamucil.   Outpatient Prescriptions Prior to Visit  Medication Sig Dispense Refill  . aspirin 81 MG tablet Take 81 mg by mouth daily.      . Black Cohosh (BLACK COHOSH HOT FLASH RELIEF) 40 MG CAPS Take 1 capsule by mouth as needed.     . clorazepate (TRANXENE) 7.5 MG tablet TAKE ONE TABLET AT BEDTIME MAY REPEAT ONCE IF NEEDED 60 tablet 3  . diazepam (VALIUM) 5 MG tablet Take 1 tablet (5 mg total) by mouth every 12 (twelve) hours as needed for anxiety. As needed for muscle spasm 60 tablet 2  . fluticasone (FLONASE) 50 MCG/ACT nasal spray Place 2 sprays into both nostrils daily as needed.    [name] GARLIC PO Take 1 tablet by mouth daily.    . hydrOXYzine (ATARAX/VISTARIL) 25 MG tablet TAKE ONE (1) CAPSULE  THREE (3) TIMES EACH DAY AS NEEDED 90 tablet 3  . Iron-Vitamins (GERITOL PO) Take by mouth.      . lactulose (CHRONULAC) 10 GM/15ML solution TAKE 30MLS(0NE OUNCE) BY MOUTH 2 TIMES DAILY AS NEEDED FOR MILD CONSTIPATION. DO NOT USE MORE THAN ONCE A WEEK 240 mL 2  . Lutein 20 MG CAPS Take 20 mg by mouth daily.      . magnesium oxide (MAG-OX) 400 MG tablet Take 400 mg by mouth daily.     [name] POTASSIUM GLUCONATE PO Take by mouth.      . vitamin B-12 (CYANOCOBALAMIN) 1000 MCG tablet Take 1,000 mcg by mouth daily.      . vitamin E 400 UNIT capsule Take 400 Units by mouth daily.      [name] docusate sodium (COLACE) 100 MG capsule Take 1 capsule (100 mg total) by mouth 2 (two) times daily. (Patient not taking: Reported on 09/11/2014) 60 capsule 11  . hydrOXYzine (VISTARIL) 25 MG capsule Take 1 capsule (25 mg total) by mouth 3 (three) times daily as needed. (Patient not taking: Reported on 09/11/2014) 90 capsule 11   No facility-administered medications prior to visit.    Review of Systems;  Patient denies headache, fevers, malaise, unintentional weight loss, skin rash, eye pain, sinus congestion and sinus pain, sore throat, dysphagia,  hemoptysis , cough, dyspnea, wheezing, chest pain, palpitations, orthopnea, edema, abdominal pain, nausea, melena, diarrhea, constipation, flank pain, dysuria, hematuria, urinary  Frequency, nocturia, numbness, tingling, seizures,  Focal weakness, Loss of consciousness,  Tremor, insomnia, depression, anxiety, and suicidal ideation.      Objective:  BP 146/82 mmHg  Pulse 70  Temp(Src) 98 F (36.7 C) (Oral)  Resp 14  Ht 5\' 6"  (1.676 m)  Wt 130 lb (58.968 kg)  BMI 20.99 kg/m2  SpO2 98%  BP Readings from Last 3 Encounters:  09/25/14 146/82  09/11/14 138/70  06/13/14 128/54    Wt Readings from Last 3 Encounters:  09/25/14 130 lb (58.968 kg)  09/11/14 129 lb (58.514 kg)  06/13/14 126 lb 8 oz (57.38 kg)    General appearance: alert, cooperative and appears stated  age Ears: normal TM's and external ear canals both ears Throat: lips, mucosa, and tongue normal; teeth and gums normal Neck: no adenopathy, no carotid bruit, supple, symmetrical, trachea midline and thyroid not enlarged, symmetric, no tenderness/mass/nodules Back: symmetric, no curvature. ROM normal. No CVA tenderness. Lungs: clear to auscultation bilaterally Heart: regular rate and rhythm, S1, S2 normal, no murmur, click, rub or gallop Abdomen: soft, non-tender; bowel sounds normal; no masses,  no organomegaly Pulses: 2+ and symmetric Skin: Skin color, texture, turgor normal. No rashes or lesions Lymph nodes: Cervical, supraclavicular, and axillary nodes normal.  No results found for: HGBA1C  Lab Results  Component Value Date   CREATININE 0.84 06/13/2014   CREATININE 0.9 07/20/2013   CREATININE 0.9 12/21/2012    Lab Results  Component Value Date   WBC 6.4 06/13/2014   HGB 12.6 06/13/2014   HCT 37.3 06/13/2014   PLT 264.0 06/13/2014   GLUCOSE 92 06/13/2014   CHOL 175 07/20/2013   TRIG 79.0 07/20/2013   HDL 37.70* 07/20/2013   LDLDIRECT 145.6 12/21/2012   LDLCALC 122* 07/20/2013   ALT 11 06/13/2014   AST 20 06/13/2014   NA 132* 06/13/2014   K 4.2 06/13/2014   CL 100 06/13/2014   CREATININE 0.84 06/13/2014   BUN 12 06/13/2014   CO2 30 06/13/2014   TSH 1.43 06/13/2014    Dg Hip Unilat With Pelvis 2-3 Views Left  06/13/2014   CLINICAL DATA:  Popping sensation in the left hip 1 year ago with persistent pain ever since. Pain with movement. Initial encounter.  EXAM: LEFT HIP (WITH PELVIS) 2-3 VIEWS  COMPARISON:  None.  FINDINGS: Osteopenia. No acute or healing fracture. No degenerative changes in the hips. Obturator rings are intact. Degenerative changes are seen in the visualized portion of the lumbar spine.  IMPRESSION: 1. No findings in the left hip to explain the patient's pain. 2. Lumbar spondylosis. 3. Osteopenia.   Electronically Signed   By: Lorin Picket M.D.   On:  06/13/2014 16:14    Assessment & Plan:   Problem List Items Addressed This Visit      Unprioritized   Back pain, sacroiliac    With recurrence of pain radiating to left gluteal area.  Despite initial improvement with PT  Pain is now interfering with daily activities.  She has spinal stenosis and scoliosis by 2014 MRI at th L2-L3 level,  Repeat MRI requested for clarification of degree of stenosis and to direct treatment.       Degeneration of lumbar or lumbosacral intervertebral disc - Primary    Cannot raise left leg while standing due to pain .  Repeat MRI ordered given recurrence of low back  pain radiating to left buttock suggesting l3 issue       Relevant Orders   MR Lumbar Spine Wo Contrast   Ambulatory referral to Sports Medicine      I am having Ms. Lograsso maintain her aspirin, Iron-Vitamins (GERITOL PO), Lutein, POTASSIUM GLUCONATE PO, magnesium oxide, vitamin E, vitamin B-12, Black Cohosh, fluticasone, docusate sodium, GARLIC PO, lactulose, diazepam, hydrOXYzine, and clorazepate.  No orders of the defined types were placed in this encounter.    Medications Discontinued During This Encounter  Medication Reason  . hydrOXYzine (VISTARIL) 25 MG capsule Duplicate    Follow-up: No Follow-up on file.   Crecencio Mc, MD

## 2014-09-25 NOTE — Assessment & Plan Note (Addendum)
Cannot raise left leg while standing due to pain .  Repeat MRI ordered given recurrence of low back pain radiating to left buttock suggesting l3 issue

## 2014-09-28 NOTE — Assessment & Plan Note (Signed)
With recurrence of pain radiating to left gluteal area.  Despite initial improvement with PT  Pain is now interfering with daily activities.  She has spinal stenosis and scoliosis by 2014 MRI at th L2-L3 level,  Repeat MRI requested for clarification of degree of stenosis and to direct treatment.

## 2014-10-03 ENCOUNTER — Ambulatory Visit
Admission: RE | Admit: 2014-10-03 | Discharge: 2014-10-03 | Disposition: A | Discharge status: Home / Self Care | Payer: Medicare Other | Source: Ambulatory Visit | Attending: Internal Medicine | Admitting: Internal Medicine

## 2014-10-03 DIAGNOSIS — M545 Low back pain: Secondary | ICD-10-CM | POA: Diagnosis not present

## 2014-10-03 DIAGNOSIS — M4186 Other forms of scoliosis, lumbar region: Secondary | ICD-10-CM | POA: Insufficient documentation

## 2014-10-03 DIAGNOSIS — R29890 Loss of height: Secondary | ICD-10-CM | POA: Diagnosis not present

## 2014-10-03 DIAGNOSIS — M5137 Other intervertebral disc degeneration, lumbosacral region: Secondary | ICD-10-CM | POA: Diagnosis not present

## 2014-10-03 DIAGNOSIS — M47816 Spondylosis without myelopathy or radiculopathy, lumbar region: Secondary | ICD-10-CM | POA: Diagnosis not present

## 2014-10-08 DIAGNOSIS — M545 Low back pain: Secondary | ICD-10-CM | POA: Diagnosis not present

## 2014-10-08 DIAGNOSIS — M5416 Radiculopathy, lumbar region: Secondary | ICD-10-CM | POA: Diagnosis not present

## 2014-10-08 DIAGNOSIS — M25552 Pain in left hip: Secondary | ICD-10-CM | POA: Diagnosis not present

## 2014-10-11 DIAGNOSIS — M545 Low back pain: Secondary | ICD-10-CM | POA: Diagnosis not present

## 2014-10-11 DIAGNOSIS — M25552 Pain in left hip: Secondary | ICD-10-CM | POA: Diagnosis not present

## 2014-10-11 DIAGNOSIS — M5416 Radiculopathy, lumbar region: Secondary | ICD-10-CM | POA: Diagnosis not present

## 2014-10-12 ENCOUNTER — Telehealth: Payer: Self-pay | Admitting: *Deleted

## 2014-10-12 DIAGNOSIS — M5137 Other intervertebral disc degeneration, lumbosacral region: Secondary | ICD-10-CM

## 2014-10-12 NOTE — Telephone Encounter (Signed)
Patient ask for order to be sent to [facility] faxed as requested.

## 2014-10-12 NOTE — Telephone Encounter (Signed)
DME  For back brace ordered and printed .  If he recommends a certain one,  He can tell her which one and then let us know which medical supply store we need to send the DME to

## 2014-10-12 NOTE — Telephone Encounter (Signed)
Patient stated that she was returning a call for [facility]. Patient stated that that the call was referring to a back brace.

## 2014-10-12 NOTE — Telephone Encounter (Signed)
Patient stated that Shanon Brow  the PT from [facility] suggested that the back brace may be beneficial to patient. Please advise.

## 2014-10-16 DIAGNOSIS — M25552 Pain in left hip: Secondary | ICD-10-CM | POA: Diagnosis not present

## 2014-10-16 DIAGNOSIS — M5416 Radiculopathy, lumbar region: Secondary | ICD-10-CM | POA: Diagnosis not present

## 2014-10-16 DIAGNOSIS — M545 Low back pain: Secondary | ICD-10-CM | POA: Diagnosis not present

## 2014-10-19 DIAGNOSIS — M25552 Pain in left hip: Secondary | ICD-10-CM | POA: Diagnosis not present

## 2014-10-19 DIAGNOSIS — M545 Low back pain: Secondary | ICD-10-CM | POA: Diagnosis not present

## 2014-10-19 DIAGNOSIS — M5416 Radiculopathy, lumbar region: Secondary | ICD-10-CM | POA: Diagnosis not present

## 2014-10-26 DIAGNOSIS — M545 Low back pain: Secondary | ICD-10-CM | POA: Diagnosis not present

## 2014-10-26 DIAGNOSIS — M5416 Radiculopathy, lumbar region: Secondary | ICD-10-CM | POA: Diagnosis not present

## 2014-10-26 DIAGNOSIS — M25552 Pain in left hip: Secondary | ICD-10-CM | POA: Diagnosis not present

## 2014-10-29 ENCOUNTER — Other Ambulatory Visit: Payer: Self-pay | Admitting: Internal Medicine

## 2014-10-29 NOTE — Telephone Encounter (Signed)
Ok to refill,  Refill sent  

## 2014-10-29 NOTE — Telephone Encounter (Signed)
Last OV 9.20.16, no OV scheduled. Please advise refill

## 2014-11-05 DIAGNOSIS — M5136 Other intervertebral disc degeneration, lumbar region: Secondary | ICD-10-CM | POA: Diagnosis not present

## 2014-11-05 DIAGNOSIS — M5416 Radiculopathy, lumbar region: Secondary | ICD-10-CM | POA: Diagnosis not present

## 2014-11-05 DIAGNOSIS — M62830 Muscle spasm of back: Secondary | ICD-10-CM | POA: Diagnosis not present

## 2014-11-06 DIAGNOSIS — M532X7 Spinal instabilities, lumbosacral region: Secondary | ICD-10-CM | POA: Diagnosis not present

## 2014-11-06 DIAGNOSIS — M9904 Segmental and somatic dysfunction of sacral region: Secondary | ICD-10-CM | POA: Diagnosis not present

## 2014-11-06 DIAGNOSIS — M9903 Segmental and somatic dysfunction of lumbar region: Secondary | ICD-10-CM | POA: Diagnosis not present

## 2014-11-06 DIAGNOSIS — M5441 Lumbago with sciatica, right side: Secondary | ICD-10-CM | POA: Diagnosis not present

## 2014-11-28 ENCOUNTER — Other Ambulatory Visit: Payer: Medicare Other

## 2014-11-28 ENCOUNTER — Other Ambulatory Visit (INDEPENDENT_AMBULATORY_CARE_PROVIDER_SITE_OTHER): Payer: Medicare Other

## 2014-11-28 DIAGNOSIS — R300 Dysuria: Secondary | ICD-10-CM

## 2014-11-28 LAB — URINALYSIS, ROUTINE W REFLEX MICROSCOPIC
Bilirubin Urine: NEGATIVE
Ketones, ur: NEGATIVE
Nitrite: POSITIVE — AB
Specific Gravity, Urine: 1.025 (ref 1.000–1.030)
Total Protein, Urine: 30 — AB
Urine Glucose: NEGATIVE
Urobilinogen, UA: 0.2 (ref 0.0–1.0)
pH: 5.5 (ref 5.0–8.0)

## 2014-11-28 LAB — POCT URINALYSIS DIPSTICK
Bilirubin, UA: NEGATIVE
Glucose, UA: NEGATIVE
Ketones, UA: NEGATIVE
Nitrite, UA: POSITIVE
Protein, UA: 30
Spec Grav, UA: 1.02
Urobilinogen, UA: 0.2
pH, UA: 5.5

## 2014-11-28 MED ORDER — SULFAMETHOXAZOLE-TRIMETHOPRIM 800-160 MG PO TABS: 1.0000 | ORAL_TABLET | Freq: Two times a day (BID) | ORAL | Status: DC

## 2014-11-28 NOTE — Addendum Note (Signed)
Addended by: Crecencio Mc on: 11/28/2014 04:41 PM   Modules accepted: Orders

## 2014-12-01 LAB — URINE CULTURE: Colony Count: >=100000

## 2014-12-04 ENCOUNTER — Encounter: Payer: Self-pay | Admitting: *Deleted

## 2014-12-25 DIAGNOSIS — M79675 Pain in left toe(s): Secondary | ICD-10-CM | POA: Diagnosis not present

## 2014-12-25 DIAGNOSIS — B351 Tinea unguium: Secondary | ICD-10-CM | POA: Diagnosis not present

## 2015-01-04 ENCOUNTER — Encounter: Payer: Self-pay | Admitting: Internal Medicine

## 2015-01-04 ENCOUNTER — Ambulatory Visit (INDEPENDENT_AMBULATORY_CARE_PROVIDER_SITE_OTHER): Payer: Medicare Other | Admitting: Internal Medicine

## 2015-01-04 VITALS — BP 138/82 | HR 64 | Temp 97.4°F | Resp 14 | Ht 64.75 in | Wt 133.2 lb

## 2015-01-04 DIAGNOSIS — R5383 Other fatigue: Secondary | ICD-10-CM | POA: Diagnosis not present

## 2015-01-04 DIAGNOSIS — Z1239 Encounter for other screening for malignant neoplasm of breast: Secondary | ICD-10-CM

## 2015-01-04 DIAGNOSIS — E559 Vitamin D deficiency, unspecified: Secondary | ICD-10-CM

## 2015-01-04 DIAGNOSIS — Z0000 Encounter for general adult medical examination without abnormal findings: Secondary | ICD-10-CM | POA: Diagnosis not present

## 2015-01-04 DIAGNOSIS — F419 Anxiety disorder, unspecified: Secondary | ICD-10-CM

## 2015-01-04 DIAGNOSIS — R636 Underweight: Secondary | ICD-10-CM

## 2015-01-04 DIAGNOSIS — M8580 Other specified disorders of bone density and structure, unspecified site: Secondary | ICD-10-CM

## 2015-01-04 DIAGNOSIS — N3941 Urge incontinence: Secondary | ICD-10-CM

## 2015-01-04 DIAGNOSIS — G4700 Insomnia, unspecified: Secondary | ICD-10-CM

## 2015-01-04 LAB — CBC WITH DIFFERENTIAL/PLATELET
Basophils Absolute: 0 10*3/uL (ref 0.0–0.1)
Basophils Relative: 0.5 % (ref 0.0–3.0)
Eosinophils Absolute: 0.1 10*3/uL (ref 0.0–0.7)
Eosinophils Relative: 1.3 % (ref 0.0–5.0)
HCT: 36.6 % (ref 36.0–46.0)
Hemoglobin: 12.3 g/dL (ref 12.0–15.0)
Lymphocytes Relative: 25.9 % (ref 12.0–46.0)
Lymphs Abs: 1.7 10*3/uL (ref 0.7–4.0)
MCHC: 33.5 g/dL (ref 30.0–36.0)
MCV: 97.2 fl (ref 78.0–100.0)
Monocytes Absolute: 0.5 10*3/uL (ref 0.1–1.0)
Monocytes Relative: 8.2 % (ref 3.0–12.0)
Neutro Abs: 4.2 10*3/uL (ref 1.4–7.7)
Neutrophils Relative %: 64.1 % (ref 43.0–77.0)
Platelets: 255 10*3/uL (ref 150.0–400.0)
RBC: 3.76 Mil/uL — ABNORMAL LOW (ref 3.87–5.11)
RDW: 13 % (ref 11.5–15.5)
WBC: 6.5 10*3/uL (ref 4.0–10.5)

## 2015-01-04 LAB — COMPREHENSIVE METABOLIC PANEL
ALT: 11 U/L (ref 0–35)
AST: 19 U/L (ref 0–37)
Albumin: 4 g/dL (ref 3.5–5.2)
Alkaline Phosphatase: 51 U/L (ref 39–117)
BUN: 15 mg/dL (ref 6–23)
CO2: 30 mEq/L (ref 19–32)
Calcium: 9.3 mg/dL (ref 8.4–10.5)
Chloride: 94 mEq/L — ABNORMAL LOW (ref 96–112)
Creatinine, Ser: 0.79 mg/dL (ref 0.40–1.20)
GFR: 73.33 mL/min (ref >60.00)
Glucose, Bld: 86 mg/dL (ref 70–99)
Potassium: 4.5 mEq/L (ref 3.5–5.1)
Sodium: 130 mEq/L — ABNORMAL LOW (ref 135–145)
Total Bilirubin: 0.4 mg/dL (ref 0.2–1.2)
Total Protein: 6.6 g/dL (ref 6.0–8.3)

## 2015-01-04 LAB — VITAMIN D 25 HYDROXY (VIT D DEFICIENCY, FRACTURES): VITD: 30.11 ng/mL (ref 30.00–100.00)

## 2015-01-04 LAB — TSH: TSH: 1.92 u[IU]/mL (ref 0.35–4.50)

## 2015-01-04 NOTE — Patient Instructions (Addendum)
You can continue the metamucil and the stool softeners twice daily    You should try to get in 12  ounces of water with each meal (3 times daily)  In between you may have one [employer]    You can stop using the diazepam on a daily basis and just use AS NEEDED to help your anxiety ( not to stop your tremor )   Menopause is a normal process in which your reproductive ability comes to an end. This process happens gradually over a span of months to years, usually between the ages of 80 and 21. Menopause is complete when you have missed 12 consecutive menstrual periods. It is important to talk with your health care provider about some of the most common conditions that affect postmenopausal women, such as heart disease, cancer, and bone loss (osteoporosis). Adopting a healthy lifestyle and getting preventive care can help to promote your health and wellness. Those actions can also lower your chances of developing some of these common conditions. WHAT SHOULD I KNOW ABOUT MENOPAUSE? During menopause, you may experience a number of symptoms, such as:  Moderate-to-severe hot flashes.  Night sweats.  Decrease in sex drive.  Mood swings.  Headaches.  Tiredness.  Irritability.  Memory problems.  Insomnia. Choosing to treat or not to treat menopausal changes is an individual decision that you make with your health care provider. WHAT SHOULD I KNOW ABOUT HORMONE REPLACEMENT THERAPY AND SUPPLEMENTS? Hormone therapy products are effective for treating symptoms that are associated with menopause, such as hot flashes and night sweats. Hormone replacement carries certain risks, especially as you become older. If you are thinking about using estrogen or estrogen with progestin treatments, discuss the benefits and risks with your health care provider. WHAT SHOULD I KNOW ABOUT HEART DISEASE AND STROKE? Heart disease, heart attack, and stroke become more likely as you age. This may be due, in part, to the  hormonal changes that your body experiences during menopause. These can affect how your body processes dietary fats, triglycerides, and cholesterol. Heart attack and stroke are both medical emergencies. There are many things that you can do to help prevent heart disease and stroke:  Have your blood pressure checked at least every 1-2 years. High blood pressure causes heart disease and increases the risk of stroke.  If you are 8-1 years old, ask your health care provider if you should take aspirin to prevent a heart attack or a stroke.  Do not use any tobacco products, including cigarettes, chewing tobacco, or electronic cigarettes. If you need help quitting, ask your health care provider.  It is important to eat a healthy diet and maintain a healthy weight.  Be sure to include plenty of vegetables, fruits, low-fat dairy products, and lean protein.  Avoid eating foods that are high in solid fats, added sugars, or salt (sodium).  Get regular exercise. This is one of the most important things that you can do for your health.  Try to exercise for at least 150 minutes each week. The type of exercise that you do should increase your heart rate and make you sweat. This is known as moderate-intensity exercise.  Try to do strengthening exercises at least twice each week. Do these in addition to the moderate-intensity exercise.  Know your numbers.Ask your health care provider to check your cholesterol and your blood glucose. Continue to have your blood tested as directed by your health care provider. WHAT SHOULD I KNOW ABOUT CANCER SCREENING? There are several types  of cancer. Take the following steps to reduce your risk and to catch any cancer development as early as possible. Breast Cancer  Practice breast self-awareness.  This means understanding how your breasts normally appear and feel.  It also means doing regular breast self-exams. Let your health care provider know about any changes,  no matter how small.  If you are 16 or older, have a clinician do a breast exam (clinical breast exam or CBE) every year. Depending on your age, family history, and medical history, it may be recommended that you also have a yearly breast X-ray (mammogram).  If you have a family history of breast cancer, talk with your health care provider about genetic screening.  If you are at high risk for breast cancer, talk with your health care provider about having an MRI and a mammogram every year.  Breast cancer (BRCA) gene test is recommended for women who have family members with BRCA-related cancers. Results of the assessment will determine the need for genetic counseling and BRCA1 and for BRCA2 testing. BRCA-related cancers include these types:  Breast. This occurs in males or females.  Ovarian.  Tubal. This may also be called fallopian tube cancer.  Cancer of the abdominal or pelvic lining (peritoneal cancer).  Prostate.  Pancreatic. Cervical, Uterine, and Ovarian Cancer Your health care provider may recommend that you be screened regularly for cancer of the pelvic organs. These include your ovaries, uterus, and vagina. This screening involves a pelvic exam, which includes checking for microscopic changes to the surface of your cervix (Pap test).  For women ages 21-65, health care providers may recommend a pelvic exam and a Pap test every three years. For women ages 67-65, they may recommend the Pap test and pelvic exam, combined with testing for human papilloma virus (HPV), every five years. Some types of HPV increase your risk of cervical cancer. Testing for HPV may also be done on women of any age who have unclear Pap test results.  Other health care providers may not recommend any screening for nonpregnant women who are considered low risk for pelvic cancer and have no symptoms. Ask your health care provider if a screening pelvic exam is right for you.  If you have had past treatment for  cervical cancer or a condition that could lead to cancer, you need Pap tests and screening for cancer for at least 20 years after your treatment. If Pap tests have been discontinued for you, your risk factors (such as having a new sexual partner) need to be reassessed to determine if you should start having screenings again. Some women have medical problems that increase the chance of getting cervical cancer. In these cases, your health care provider may recommend that you have screening and Pap tests more often.  If you have a family history of uterine cancer or ovarian cancer, talk with your health care provider about genetic screening.  If you have vaginal bleeding after reaching menopause, tell your health care provider.  There are currently no reliable tests available to screen for ovarian cancer. Lung Cancer Lung cancer screening is recommended for adults 87-39 years old who are at high risk for lung cancer because of a history of smoking. A yearly low-dose CT scan of the lungs is recommended if you:  Currently smoke.  Have a history of at least 30 pack-years of smoking and you currently smoke or have quit within the past 15 years. A pack-year is smoking an average of one pack of cigarettes  per day for one year. Yearly screening should:  Continue until it has been 15 years since you quit.  Stop if you develop a health problem that would prevent you from having lung cancer treatment. Colorectal Cancer  This type of cancer can be detected and can often be prevented.  Routine colorectal cancer screening usually begins at age 69 and continues through age 12.  If you have risk factors for colon cancer, your health care provider may recommend that you be screened at an earlier age.  If you have a family history of colorectal cancer, talk with your health care provider about genetic screening.  Your health care provider may also recommend using home test kits to check for hidden blood in  your stool.  A small camera at the end of a tube can be used to examine your colon directly (sigmoidoscopy or colonoscopy). This is done to check for the earliest forms of colorectal cancer.  Direct examination of the colon should be repeated every 5-10 years until age 14. However, if early forms of precancerous polyps or small growths are found or if you have a family history or genetic risk for colorectal cancer, you may need to be screened more often. Skin Cancer  Check your skin from head to toe regularly.  Monitor any moles. Be sure to tell your health care provider:  About any new moles or changes in moles, especially if there is a change in a mole's shape or color.  If you have a mole that is larger than the size of a pencil eraser.  If any of your family members has a history of skin cancer, especially at a young age, talk with your health care provider about genetic screening.  Always use sunscreen. Apply sunscreen liberally and repeatedly throughout the day.  Whenever you are outside, protect yourself by wearing long sleeves, pants, a wide-brimmed hat, and sunglasses. WHAT SHOULD I KNOW ABOUT OSTEOPOROSIS? Osteoporosis is a condition in which bone destruction happens more quickly than new bone creation. After menopause, you may be at an increased risk for osteoporosis. To help prevent osteoporosis or the bone fractures that can happen because of osteoporosis, the following is recommended:  If you are 40-43 years old, get at least 1,000 mg of calcium and at least 600 mg of vitamin D per day.  If you are older than age 78 but younger than age 90, get at least 1,200 mg of calcium and at least 600 mg of vitamin D per day.  If you are older than age 63, get at least 1,200 mg of calcium and at least 800 mg of vitamin D per day. Smoking and excessive alcohol intake increase the risk of osteoporosis. Eat foods that are rich in calcium and vitamin D, and do weight-bearing exercises  several times each week as directed by your health care provider. WHAT SHOULD I KNOW ABOUT HOW MENOPAUSE AFFECTS [place]? Depression may occur at any age, but it is more common as you become older. Common symptoms of depression include:  Low or sad mood.  Changes in sleep patterns.  Changes in appetite or eating patterns.  Feeling an overall lack of motivation or enjoyment of activities that you previously enjoyed.  Frequent crying spells. Talk with your health care provider if you think that you are experiencing depression. WHAT SHOULD I KNOW ABOUT IMMUNIZATIONS? It is important that you get and maintain your immunizations. These include:  Tetanus, diphtheria, and pertussis (Tdap) booster vaccine.  Influenza  every year before the flu season begins.  Pneumonia vaccine.  Shingles vaccine. Your health care provider may also recommend other immunizations.   This information is not intended to replace advice given to you by your health care provider. Make sure you discuss any questions you have with your health care provider.   Document Released: 02/13/2005 Document Revised: 01/12/2014 Document Reviewed: 08/24/2013 Elsevier Interactive Patient Education [employer].

## 2015-01-04 NOTE — Progress Notes (Signed)
Patient ID: [name], female    DOB: March 24, 1926  Age: 81 y.o. MRN: [id number]  The patient is here for annual wellness examination and management of other chronic and acute problems.   The risk factors are reflected in the social history.  The roster of all physicians providing medical care to patient - is listed in the Snapshot section of the chart.  Activities of daily living:  The patient is 100% independent in all ADLs: dressing, toileting, feeding as well as independent mobility  Home safety : The patient has smoke detectors in the home. They wear seatbelts.  There are no firearms at home. There is no violence in the home.   There is no risks for hepatitis, STDs or HIV. There is no   history of blood transfusion. They have no travel history to infectious disease endemic areas of the world.  The patient has seen their dentist in the last six month. They have seen their eye doctor in the last year. They admit to slight hearing difficulty with regard to whispered voices and some television programs.  They have deferred audiologic testing in the last year.  They do not  have excessive sun exposure. Discussed the need for sun protection: hats, long sleeves and use of sunscreen if there is significant sun exposure.   Diet: the importance of a healthy diet is discussed. They do have a healthy diet.  The benefits of regular aerobic exercise were discussed. She walks 4 times per week ,  20 minutes.   Depression screen: there are no signs or vegative symptoms of depression- irritability, change in appetite, anhedonia, sadness/tearfullness.  Cognitive assessment: the patient manages all their financial and personal affairs and is actively engaged. They could relate day,date,year and events; recalled 2/3 objects at 3 minutes; performed clock-face test normally.  The following portions of the patient's history were reviewed and updated as appropriate: allergies, current medications, past family  history, past medical history,  past surgical history, past social history  and problem list.  Visual acuity was not assessed per patient preference since she has regular follow up with her ophthalmologist. Hearing and body mass index were assessed and reviewed.   During the course of the visit the patient was educated and counseled about appropriate screening and preventive services including : fall prevention , diabetes screening, nutrition counseling, colorectal cancer screening, and recommended immunizations.    CC: The primary encounter diagnosis was Vitamin D deficiency. Diagnoses of Other fatigue, Osteopenia of the elderly, Anxiety, Medicare annual wellness visit, subsequent, Encounter for preventive health examination, Screening for breast cancer, Urinary incontinence, urge, Insomnia, and Underweight were also pertinent to this visit.  She has been having Some urnary urge incontinence .  Right thumb has been intermittently going numb .  Chronic constipation , using metamucil and a stool softener twice daily    Chronic anxiety.  She has been using 5 mg valium once  daily ,  Hydroxyzine 25 mg  at bedtime along with chlorazepate 7.5 mg for management of insomnia,     Back pain has been stable.   had a cortisone injection by [facility] in an area of prior compression  fracture at  L1 . she saw no significant change in pain.  Does not want additional injections..  Pain is tolerable without opioids.   Using garlic to manage her cholesterol.    No health screenings.  Last DEXA 2002 [facility] . Needs DEXA   History [name] has a past medical history of  Chronic orthostatic hypotension (June 2012); Anxiety; Hyperlipidemia; Depression; and Back pain.   She has past surgical history that includes Ankle surgery (Left); Wrist surgery (Right); and Cataract extraction, bilateral.   Her family history is negative for Cancer, Diabetes, and Heart disease.She reports that she has never smoked. She has never  used smokeless tobacco. She reports that she does not drink alcohol or use illicit drugs.  Outpatient Prescriptions Prior to Visit  Medication Sig Dispense Refill  . aspirin 81 MG tablet Take 81 mg by mouth daily.      . Black Cohosh (BLACK COHOSH HOT FLASH RELIEF) 40 MG CAPS Take 1 capsule by mouth as needed.     . clorazepate (TRANXENE) 7.5 MG tablet TAKE ONE TABLET AT BEDTIME MAY REPEAT ONCE IF NEEDED 60 tablet 3  . docusate sodium (COLACE) 100 MG capsule Take 1 capsule (100 mg total) by mouth 2 (two) times daily. 60 capsule 11  . fluticasone (FLONASE) 50 MCG/ACT nasal spray Place 2 sprays into both nostrils daily as needed.    [name] GARLIC PO Take 1 tablet by mouth daily.    . hydrOXYzine (ATARAX/VISTARIL) 25 MG tablet TAKE ONE (1) CAPSULE THREE (3) TIMES EACH DAY AS NEEDED 90 tablet 3  . Iron-Vitamins (GERITOL PO) Take by mouth.      . lactulose (CHRONULAC) 10 GM/15ML solution TAKE 30MLS(0NE OUNCE) BY MOUTH 2 TIMES DAILY AS NEEDED FOR MILD CONSTIPATION. DO NOT USE MORE THAN ONCE A WEEK 240 mL 2  . Lutein 20 MG CAPS Take 20 mg by mouth daily.      . magnesium oxide (MAG-OX) 400 MG tablet Take 400 mg by mouth daily.     [name] POTASSIUM GLUCONATE PO Take by mouth.      . vitamin B-12 (CYANOCOBALAMIN) 1000 MCG tablet Take 1,000 mcg by mouth daily.      . vitamin E 400 UNIT capsule Take 400 Units by mouth daily.      . diazepam (VALIUM) 5 MG tablet TAKE ONE TABLET EVERY 12 HOURS ANXIETY AND MUSCLE SPASMS 60 tablet 3  . sulfamethoxazole-trimethoprim (BACTRIM DS,SEPTRA DS) 800-160 MG tablet Take 1 tablet by mouth 2 (two) times daily. 10 tablet 0   No facility-administered medications prior to visit.    Review of Systems  Patient denies headache, fevers, malaise, unintentional weight loss, skin rash, eye pain, sinus congestion and sinus pain, sore throat, dysphagia,  hemoptysis , cough, dyspnea, wheezing, chest pain, palpitations, orthopnea, edema, abdominal pain, nausea, melena, diarrhea,  constipation, flank pain, dysuria, hematuria, urinary  Frequency, nocturia, numbness, tingling, seizures,  Focal weakness, Loss of consciousness,  Tremor, insomnia, depression, anxiety, and suicidal ideation.     Objective:  BP 138/82 mmHg  Pulse 64  Temp(Src) 97.4 F (36.3 C) (Oral)  Resp 14  Ht 5' 4.75" (1.645 m)  Wt 133 lb 3.2 oz (60.419 kg)  BMI 22.33 kg/m2  SpO2 95%  Physical Exam   General appearance: alert, cooperative and appears stated age Ears: normal TM's and external ear canals both ears Throat: lips, mucosa, and tongue normal; teeth and gums normal Neck: no adenopathy, no carotid bruit, supple, symmetrical, trachea midline and thyroid not enlarged, symmetric, no tenderness/mass/nodules Back: symmetric, no curvature. ROM normal. No CVA tenderness. Lungs: clear to auscultation bilaterally Heart: regular rate and rhythm, S1, S2 normal, no murmur, click, rub or gallop Abdomen: soft, non-tender; bowel sounds normal; no masses,  no organomegaly Pulses: 2+ and symmetric Skin: Skin color, texture, turgor normal. No rashes or lesions Lymph  nodes: Cervical, supraclavicular, and axillary nodes normal.    Assessment & Plan:   Problem List Items Addressed This Visit    Anxiety    Advised to suspend the valium since she is using it to treat her tremor,  Which I advised is not caused by anxiety.        Insomnia    Managed with tranxene and hydroxyzine. No changes today.       Screening for breast cancer    She has deferred further testing due to her age.       Urinary incontinence, urge    Discussed medications to treat her sympomts, including side effects.  She has deferred medication at this time.       Underweight    . I have reviewed her diet and recommended that she increase her protein and fat intake while monitoring her carbohydrates.       Medicare annual wellness visit, subsequent   Encounter for preventive health examination    Annual comprehensive  preventive exam was done as well as an evaluation and management of chronic conditions .  During the course of the visit the patient was educated and counseled about appropriate screening and preventive services including :  diabetes screening, lipid analysis , , nutrition counseling, colorectal cancer screening, and recommended immunizations.  Printed recommendations for health maintenance screenings was given.   Lab Results  Component Value Date   WBC 6.5 01/04/2015   HGB 12.3 01/04/2015   HCT 36.6 01/04/2015   MCV 97.2 01/04/2015   PLT 255.0 01/04/2015   Lab Results  Component Value Date   CREATININE 0.79 01/04/2015   Lab Results  Component Value Date   NA 130* 01/04/2015   K 4.5 01/04/2015   CL 94* 01/04/2015   CO2 30 01/04/2015          Other Visit Diagnoses    Vitamin D deficiency    -  Primary    Relevant Orders    VITAMIN D 25 Hydroxy (Vit-D Deficiency, Fractures) (Completed)    Other fatigue        Relevant Orders    Comprehensive metabolic panel (Completed)    TSH (Completed)    CBC with Differential/Platelet (Completed)    Osteopenia of the elderly        Relevant Orders    DG Bone Density       I have discontinued Ms. Gilberti's diazepam and sulfamethoxazole-trimethoprim. I am also having her maintain her aspirin, Iron-Vitamins (GERITOL PO), Lutein, POTASSIUM GLUCONATE PO, magnesium oxide, vitamin E, vitamin B-12, Black Cohosh, fluticasone, docusate sodium, GARLIC PO, lactulose, hydrOXYzine, and clorazepate.  No orders of the defined types were placed in this encounter.    Medications Discontinued During This Encounter  Medication Reason  . sulfamethoxazole-trimethoprim (BACTRIM DS,SEPTRA DS) 800-160 MG tablet Completed Course  . diazepam (VALIUM) 5 MG tablet     Follow-up: No Follow-up on file.   Crecencio Mc, MD

## 2015-01-04 NOTE — Progress Notes (Signed)
Pre visit review using our clinic review tool, if applicable. No additional management support is needed unless otherwise documented below in the visit note. 

## 2015-01-06 DIAGNOSIS — Z0000 Encounter for general adult medical examination without abnormal findings: Secondary | ICD-10-CM | POA: Insufficient documentation

## 2015-01-06 NOTE — Assessment & Plan Note (Addendum)
Annual comprehensive preventive exam was done as well as an evaluation and management of chronic conditions .  During the course of the visit the patient was educated and counseled about appropriate screening and preventive services including :  diabetes screening, lipid analysis , , nutrition counseling, colorectal cancer screening, and recommended immunizations.  Printed recommendations for health maintenance screenings was given.   Lab Results  Component Value Date   WBC 6.5 01/04/2015   HGB 12.3 01/04/2015   HCT 36.6 01/04/2015   MCV 97.2 01/04/2015   PLT 255.0 01/04/2015   Lab Results  Component Value Date   CREATININE 0.79 01/04/2015   Lab Results  Component Value Date   NA 130* 01/04/2015   K 4.5 01/04/2015   CL 94* 01/04/2015   CO2 30 01/04/2015

## 2015-01-06 NOTE — Assessment & Plan Note (Signed)
Discussed medications to treat her sympomts, including side effects.  She has deferred medication at this time.

## 2015-01-06 NOTE — Assessment & Plan Note (Signed)
I have reviewed her diet and recommended that she increase her protein and fat intake while monitoring her carbohydrates.  

## 2015-01-06 NOTE — Assessment & Plan Note (Signed)
Managed with tranxene and hydroxyzine. No changes today.

## 2015-01-06 NOTE — Assessment & Plan Note (Signed)
Discussed trial of metoprolol but she has deferred for now.

## 2015-01-06 NOTE — Assessment & Plan Note (Signed)
She has deferred further testing due to her age.

## 2015-01-06 NOTE — Assessment & Plan Note (Signed)
Advised to suspend the valium since she is using it to treat her tremor,  Which I advised is not caused by anxiety.

## 2015-01-07 ENCOUNTER — Telehealth: Payer: Self-pay | Admitting: Internal Medicine

## 2015-01-07 NOTE — Telephone Encounter (Signed)
your sodium level is a little low compared to last time.  This suggests mild  dehydration.  Please try to drink at least 3 16 ounce servings of water  Or gatorade daily, and liberalize the salt in your diet.

## 2015-01-08 NOTE — Telephone Encounter (Signed)
Patient notified and voiced understanding. " I will try to increase my intake with gatorade and water."

## 2015-01-28 DIAGNOSIS — M859 Disorder of bone density and structure, unspecified: Secondary | ICD-10-CM | POA: Diagnosis not present

## 2015-01-28 DIAGNOSIS — M8580 Other specified disorders of bone density and structure, unspecified site: Secondary | ICD-10-CM | POA: Diagnosis not present

## 2015-01-29 LAB — HM DEXA SCAN

## 2015-01-30 ENCOUNTER — Encounter: Payer: Self-pay | Admitting: Family Medicine

## 2015-01-30 ENCOUNTER — Telehealth: Payer: Self-pay | Admitting: *Deleted

## 2015-01-30 ENCOUNTER — Ambulatory Visit (INDEPENDENT_AMBULATORY_CARE_PROVIDER_SITE_OTHER): Payer: Medicare Other | Admitting: Family Medicine

## 2015-01-30 DIAGNOSIS — R350 Frequency of micturition: Secondary | ICD-10-CM | POA: Insufficient documentation

## 2015-01-30 DIAGNOSIS — N390 Urinary tract infection, site not specified: Secondary | ICD-10-CM | POA: Diagnosis not present

## 2015-01-30 LAB — POC URINALSYSI DIPSTICK (AUTOMATED)
Bilirubin, UA: NEGATIVE
Glucose, UA: NEGATIVE
Ketones, UA: NEGATIVE
Leukocytes, UA: NEGATIVE
Nitrite, UA: NEGATIVE
Protein, UA: NEGATIVE
Spec Grav, UA: 1.01
Urobilinogen, UA: 0.2
pH, UA: 5.5

## 2015-01-30 NOTE — Patient Instructions (Addendum)
Your history and urine was not consistent with UTI.  This is likely related to overactive bladder and fluid intake.  There is no evidence that you are dehydrated at this time.  We will call with the culture results.  Follow up as needed  Take care  Dr. Lacinda Axon

## 2015-01-30 NOTE — Telephone Encounter (Signed)
Patient stated that she was seen in the office 01/30/15, she has concerns that she's urinating to often, she's drinking  8 glasses of water a day . She's concerned that she will get dehydrated from urinating so often.

## 2015-01-30 NOTE — Progress Notes (Signed)
Pre visit review using our clinic review tool, if applicable. No additional management support is needed unless otherwise documented below in the visit note. 

## 2015-01-30 NOTE — Assessment & Plan Note (Signed)
Patient with urinary frequency and urgency. Patient has a history of urge incontinence/overactive bladder. This is the likely source of her current symptoms in addition to increased fluid intake to combat worry about dehydration. Urinalysis revealed trace lysed blood. It was otherwise negative. History and urinalysis not consistent with UTI. Will send culture for confirmation. Advised supportive care.

## 2015-01-30 NOTE — Progress Notes (Signed)
Subjective:  Patient ID: [name], female    DOB: [birth year]  Age: 82 y.o. MRN: [id number]  CC: Concern for UTI/Kidney infection  HPI:  82 year old female with a past medical history of urge incontinence presents with the above concern.  Patient states that since Monday she has had urinary frequency and urgency. She states that she's having to get up several times to urinate during the night. She denies any associated dysuria. No abdominal pain or fever. No nausea or vomiting. She states that given her symptoms she was worried that she was going to get dehydrated so she's been increasing her fluid intake. No exacerbating or relieving factors. She states that she is concerned that she has a kidney infection.  Social Hx   Social History   Social History  . Marital Status: Married    Spouse Name: N/A  . Number of Children: N/A  . Years of Education: N/A   Occupational History  . retired     Fish farm manager   Social History Main Topics  . Smoking status: Never Smoker   . Smokeless tobacco: Never Used  . Alcohol Use: No  . Drug Use: No  . Sexual Activity: Not Currently   Other Topics Concern  . None   Social History Narrative   Review of Systems  Constitutional: Negative for fever.  Gastrointestinal: Negative for abdominal pain.  Genitourinary: Positive for urgency and frequency. Negative for dysuria.   Objective:  BP 122/76 mmHg  Pulse 63  Temp(Src) 97.5 F (36.4 C) (Oral)  Wt 130 lb (58.968 kg)  SpO2 99%  BP/Weight 01/30/2015 01/04/2015 [id number]  Systolic BP 123XX123 [id number]  Diastolic BP 76 82 82  Wt. (Lbs) 130 133.2 130  BMI 21.79 22.33 20.99   Physical Exam  Constitutional: She appears well-developed. No distress.  Cardiovascular: Normal rate and regular rhythm.   Pulmonary/Chest: Effort normal and breath sounds normal.  Abdominal: Soft. She exhibits no distension. There is no tenderness. There is no rebound and no guarding.  Neurological:  She is alert.  Psychiatric: She has a normal mood and affect.  Vitals reviewed.  Lab Results  Component Value Date   WBC 6.5 01/04/2015   HGB 12.3 01/04/2015   HCT 36.6 01/04/2015   PLT 255.0 01/04/2015   GLUCOSE 86 01/04/2015   CHOL 175 07/20/2013   TRIG 79.0 07/20/2013   HDL 37.70* 07/20/2013   LDLDIRECT 145.6 12/21/2012   LDLCALC 122* 07/20/2013   ALT 11 01/04/2015   AST 19 01/04/2015   NA 130* 01/04/2015   K 4.5 01/04/2015   CL 94* 01/04/2015   CREATININE 0.79 01/04/2015   BUN 15 01/04/2015   CO2 30 01/04/2015   TSH 1.92 01/04/2015   Results for orders placed or performed in visit on 01/30/15 (from the past 24 hour(s))  POCT Urinalysis Dipstick (Automated)     Status: None   Collection Time: 01/30/15  2:52 PM  Result Value Ref Range   Color, UA yellow    Clarity, UA clear    Glucose, UA neg    Bilirubin, UA neg    Ketones, UA neg    Spec Grav, UA 1.010    Blood, UA tracelysed    pH, UA 5.5    Protein, UA neg    Urobilinogen, UA 0.2    Nitrite, UA neg    Leukocytes, UA Negative Negative   Assessment & Plan:   Problem List Items Addressed This Visit    Urinary frequency  Patient with urinary frequency and urgency. Patient has a history of urge incontinence/overactive bladder. This is the likely source of her current symptoms in addition to increased fluid intake to combat worry about dehydration. Urinalysis revealed trace lysed blood. It was otherwise negative. History and urinalysis not consistent with UTI. Will send culture for confirmation. Advised supportive care.       Relevant Orders   POCT Urinalysis Dipstick (Automated) (Completed)   Urine Culture      Follow-up: PRN  [place]

## 2015-02-03 LAB — URINE CULTURE: Colony Count: >=100000

## 2015-02-04 ENCOUNTER — Telehealth: Payer: Self-pay | Admitting: Internal Medicine

## 2015-02-04 ENCOUNTER — Ambulatory Visit: Payer: Medicare Other | Admitting: Internal Medicine

## 2015-02-04 ENCOUNTER — Other Ambulatory Visit: Payer: Self-pay | Admitting: Family Medicine

## 2015-02-04 MED ORDER — CEPHALEXIN 500 MG PO CAPS: 500.0000 mg | ORAL_CAPSULE | Freq: Two times a day (BID) | ORAL | Status: DC

## 2015-02-04 NOTE — Telephone Encounter (Signed)
Ok, Monday evening appointments should not be offered to retired patients unless there are open slots on  the day in question or if the elderly patient has to be driven by a family member who is working normal hours.  Please pass this on to those who schedule the evening slots; they should be for acutes first,  Hospital follow ups second,

## 2015-02-04 NOTE — Telephone Encounter (Signed)
FYI, Pt will not be coming in for her appointment today, due to not wanting to be out in the dark after her appt. I rescheduled her appt to 02/05/2014. Thank you!

## 2015-02-05 ENCOUNTER — Encounter: Payer: Self-pay | Admitting: Internal Medicine

## 2015-02-06 ENCOUNTER — Ambulatory Visit (INDEPENDENT_AMBULATORY_CARE_PROVIDER_SITE_OTHER): Payer: Medicare Other | Admitting: Internal Medicine

## 2015-02-06 ENCOUNTER — Encounter: Payer: Self-pay | Admitting: Internal Medicine

## 2015-02-06 VITALS — BP 128/76 | HR 62 | Temp 98.0°F | Resp 12 | Ht 65.0 in | Wt 134.0 lb

## 2015-02-06 DIAGNOSIS — F329 Major depressive disorder, single episode, unspecified: Secondary | ICD-10-CM

## 2015-02-06 DIAGNOSIS — M533 Sacrococcygeal disorders, not elsewhere classified: Secondary | ICD-10-CM

## 2015-02-06 DIAGNOSIS — K5900 Constipation, unspecified: Secondary | ICD-10-CM

## 2015-02-06 DIAGNOSIS — E871 Hypo-osmolality and hyponatremia: Secondary | ICD-10-CM | POA: Diagnosis not present

## 2015-02-06 DIAGNOSIS — F32A Depression, unspecified: Secondary | ICD-10-CM

## 2015-02-06 NOTE — Progress Notes (Signed)
Pre-visit discussion using our clinic review tool. No additional management support is needed unless otherwise documented below in the visit note.  

## 2015-02-06 NOTE — Progress Notes (Signed)
Subjective:  Patient ID: [name], female   OB: Aug 28, 1926  Age: 82 y.o. MRN: [id number]  CC: The primary encounter diagnosis was Back pain, sacroiliac. Diagnoses of Melancholia, Constipation, unspecified constipation type, and Hyponatremia were also pertinent to this visit.  HPI [name] presents for follow up on recent UTI.  She was last seen 5 days ago by Dr Lacinda Axon for urinary urgency,  A dipstick was normal but Urine culture eventually grew 100K E Coli and she  was treated with keflex 500 mg bid x 5 days for pan sensitive E Coli .  Urgency and frequency has resolved by today's report. .    Discussed that fact that this washer 2nd UTI in 3 months.  None in recent years.  Not sexually active, has mild urge incontinence untreated per patient preference.  Discussed adding daily cranberry tablets.  Patient raising several other concerns today:  1) Has episodes of Right thumb/finger becomes numb after prolonged use (crocheting)   2) She has been gaining weight due to salt loading for managment of  chronic hyponatremia  3) Has periodic episodes of  Waking up without motivation to do anything secondary to decreased energy.  Her beloved husband died 3 years ago..  She has daily contact with her good friind, Florine and has social outings at least once a week. She does note thtat at times her back pain prevents her from going out and being active in the yard.  Outpatient Prescriptions Prior to Visit  Medication Sig Dispense Refill  . aspirin 81 MG tablet Take 81 mg by mouth daily.      . Black Cohosh (BLACK COHOSH HOT FLASH RELIEF) 40 MG CAPS Take 1 capsule by mouth as needed.     . cephALEXin (KEFLEX) 500 MG capsule Take 1 capsule (500 mg total) by mouth 2 (two) times daily. 10 capsule 0  . clorazepate (TRANXENE) 7.5 MG tablet TAKE ONE TABLET AT BEDTIME MAY REPEAT ONCE IF NEEDED 60 tablet 3  . docusate sodium (COLACE) 100 MG capsule Take 1 capsule (100 mg total) by mouth 2 (two)  times daily. 60 capsule 11  . fluticasone (FLONASE) 50 MCG/ACT nasal spray Place 2 sprays into both nostrils daily as needed.    [name] GARLIC PO Take 1 tablet by mouth daily.    . hydrOXYzine (ATARAX/VISTARIL) 25 MG tablet TAKE ONE (1) CAPSULE THREE (3) TIMES EACH DAY AS NEEDED 90 tablet 3  . Iron-Vitamins (GERITOL PO) Take by mouth.      . lactulose (CHRONULAC) 10 GM/15ML solution TAKE 30MLS(0NE OUNCE) BY MOUTH 2 TIMES DAILY AS NEEDED FOR MILD CONSTIPATION. DO NOT USE MORE THAN ONCE A WEEK 240 mL 2  . Lutein 20 MG CAPS Take 20 mg by mouth daily.      . magnesium oxide (MAG-OX) 400 MG tablet Take 400 mg by mouth daily.     [name] POTASSIUM GLUCONATE PO Take by mouth.      . vitamin B-12 (CYANOCOBALAMIN) 1000 MCG tablet Take 1,000 mcg by mouth daily.      . vitamin E 400 UNIT capsule Take 400 Units by mouth daily.       No facility-administered medications prior to visit.    Review of Systems;  Patient denies headache, fevers, malaise, unintentional weight loss, skin rash, eye pain, sinus congestion and sinus pain, sore throat, dysphagia,  hemoptysis , cough, dyspnea, wheezing, chest pain, palpitations, orthopnea, edema, abdominal pain, nausea, melena, diarrhea, constipation, flank pain, dysuria, hematuria, urinary  Frequency, nocturia, numbness, tingling, seizures,  Focal weakness, Loss of consciousness,  Tremor, insomnia, depression, anxiety, and suicidal ideation.      Objective:  BP 128/76 mmHg  Pulse 62  Temp(Src) 98 F (36.7 C) (Oral)  Resp 12  Ht 5\' 5"  (1.651 m)  Wt 134 lb (60.782 kg)  BMI 22.30 kg/m2  SpO2 99%  BP Readings from Last 3 Encounters:  02/06/15 128/76  01/30/15 122/76  01/04/15 138/82    Wt Readings from Last 3 Encounters:  02/06/15 134 lb (60.782 kg)  01/30/15 130 lb (58.968 kg)  01/04/15 133 lb 3.2 oz (60.419 kg)    General appearance: alert, cooperative and appears stated age Ears: normal TM's and external ear canals both ears Throat: lips, mucosa, and  tongue normal; teeth and gums normal Neck: no adenopathy, no carotid bruit, supple, symmetrical, trachea midline and thyroid not enlarged, symmetric, no tenderness/mass/nodules Back: symmetric, no curvature. ROM normal. No CVA tenderness. Lungs: clear to auscultation bilaterally Heart: regular rate and rhythm, S1, S2 normal, no murmur, click, rub or gallop Abdomen: soft, non-tender; bowel sounds normal; no masses,  no organomegaly Pulses: 2+ and symmetric Skin: Skin color, texture, turgor normal. No rashes or lesions Lymph nodes: Cervical, supraclavicular, and axillary nodes normal.  No results found for: HGBA1C  Lab Results  Component Value Date   CREATININE 0.79 01/04/2015   CREATININE 0.84 06/13/2014   CREATININE 0.9 07/20/2013    Lab Results  Component Value Date   WBC 6.5 01/04/2015   HGB 12.3 01/04/2015   HCT 36.6 01/04/2015   PLT 255.0 01/04/2015   GLUCOSE 86 01/04/2015   CHOL 175 07/20/2013   TRIG 79.0 07/20/2013   HDL 37.70* 07/20/2013   LDLDIRECT 145.6 12/21/2012   LDLCALC 122* 07/20/2013   ALT 11 01/04/2015   AST 19 01/04/2015   NA 130* 01/04/2015   K 4.5 01/04/2015   CL 94* 01/04/2015   CREATININE 0.79 01/04/2015   BUN 15 01/04/2015   CO2 30 01/04/2015   TSH 1.92 01/04/2015    Mr Lumbar Spine Wo Contrast  10/03/2014  CLINICAL DATA:  82 year old female with pain radiating to the left buttock. L1 compression fracture in 2015. Subsequent encounter. EXAM: MRI LUMBAR SPINE WITHOUT CONTRAST TECHNIQUE: Multiplanar, multisequence MR imaging of the lumbar spine was performed. No intravenous contrast was administered. COMPARISON:  Lumbar MRI 10/14/2013. Lumbar radiographs 07/20/2013, and earlier. FINDINGS: Chronic levoconvex lumbar scoliosis and mild reversal of lumbar lordosis. Normal lumbar segmentation demonstrated on 07/20/2013. This is the same numbering system as on the MRI that year. L1 compression fracture re-identified and appears healed. No convincing residual  marrow edema at that level. Height and alignment are stable since October. Visible lower thoracic levels remain intact. Lumbar vertebral height and alignment elsewhere appears stable. Degenerative endplate marrow edema at L2-L3 and L3-L4 re- identified and eccentric to the right. This has not significantly changed. The sacrum is poorly visualized on sagittal images today. Visualized sacrum on axial images appears intact. Visualized lower thoracic spinal cord is normal with conus medularis at T12. Visualized abdominal viscera and paraspinal soft tissues are within normal limits. Advanced widespread lumbar degenerative changes as described in 2015 appears stable. Among these, left side neural impingement appears maximal at L4-L5 (multifactorial moderate to severe left lateral recess and foraminal stenosis). IMPRESSION: 1. 2015 L1 compression fracture appears healed with stable loss of height and no adverse features. 2. Advanced lumbar scoliosis and degenerative findings are stable. As before, left side side neural impingement appears maximal  at L4-L5. Electronically Signed   By: Genevie Ann M.D.   On: 10/03/2014 13:11    Assessment & Plan:   Problem List Items Addressed This Visit    Back pain, sacroiliac - Primary    Managed with ESI by Chasnis for spinal stenosis.        Constipation    Improved,  Advised to reduce use of laxatives       Melancholia    Episodic,  likely due to unresolved grief and loneliness following the loss of her husband 3 years ago,  Discussed adding SSRI which she has deferred.       Hyponatremia    Chronic, stable , managed with salt loading.  Weight gain addressed and reassurance provided that she has no signg of volume overload or overweight.    Lab Results  Component Value Date   NA 130* 01/04/2015   K 4.5 01/04/2015   CL 94* 01/04/2015   CO2 30 01/04/2015           A total of 25 minutes of face to face time was spent with patient more than half of which was  spent in counselling about the above mentioned conditions  and coordination of care   I have discontinued Ms. Tuft's POTASSIUM GLUCONATE PO, magnesium oxide, vitamin E, and vitamin B-12. I am also having her maintain her aspirin, Iron-Vitamins (GERITOL PO), Lutein, Black Cohosh, fluticasone, docusate sodium, GARLIC PO, lactulose, hydrOXYzine, clorazepate, and cephALEXin.  No orders of the defined types were placed in this encounter.    Medications Discontinued During This Encounter  Medication Reason  . POTASSIUM GLUCONATE PO   . vitamin E 400 UNIT capsule   . vitamin B-12 (CYANOCOBALAMIN) 1000 MCG tablet   . magnesium oxide (MAG-OX) 400 MG tablet     Follow-up: Return in about 3 months (around 05/06/2015).   Crecencio Mc, MD

## 2015-02-06 NOTE — Patient Instructions (Addendum)
Try taking cranberry tablets daily to prevent Urinary tract infections  You do not need Magnesium, potassium,  iron or vitamin E supplements    You and Florine should try making smoothies to get your spinach and other greens in daily.  If  use frozen fruit (slightly thawed) you will not even taste the greens!   Stop your  Daily docusate (colace) .  [place] every other day   Continue using metamucil every night

## 2015-02-09 ENCOUNTER — Encounter: Payer: Self-pay | Admitting: Internal Medicine

## 2015-02-09 DIAGNOSIS — F32A Depression, unspecified: Secondary | ICD-10-CM | POA: Insufficient documentation

## 2015-02-09 DIAGNOSIS — F329 Major depressive disorder, single episode, unspecified: Secondary | ICD-10-CM | POA: Insufficient documentation

## 2015-02-09 DIAGNOSIS — E871 Hypo-osmolality and hyponatremia: Secondary | ICD-10-CM | POA: Insufficient documentation

## 2015-02-09 NOTE — Assessment & Plan Note (Signed)
Improved,  Advised to reduce use of laxatives

## 2015-02-09 NOTE — Assessment & Plan Note (Signed)
Chronic, stable , managed with salt loading.  Weight gain addressed and reassurance provided that she has no signg of volume overload or overweight.    Lab Results  Component Value Date   NA 130* 01/04/2015   K 4.5 01/04/2015   CL 94* 01/04/2015   CO2 30 01/04/2015

## 2015-02-09 NOTE — Assessment & Plan Note (Signed)
Managed with ESI by Chasnis for spinal stenosis.

## 2015-02-09 NOTE — Assessment & Plan Note (Signed)
Episodic,  likely due to unresolved grief and loneliness following the loss of her husband 3 years ago,  Discussed adding SSRI which she has deferred.

## 2015-02-22 ENCOUNTER — Encounter: Payer: Self-pay | Admitting: Internal Medicine

## 2015-02-22 ENCOUNTER — Ambulatory Visit (INDEPENDENT_AMBULATORY_CARE_PROVIDER_SITE_OTHER): Payer: Medicare Other | Admitting: Internal Medicine

## 2015-02-22 VITALS — BP 118/80 | HR 75 | Temp 97.6°F | Ht 65.0 in | Wt 133.5 lb

## 2015-02-22 DIAGNOSIS — I7300 Raynaud's syndrome without gangrene: Secondary | ICD-10-CM | POA: Diagnosis not present

## 2015-02-22 NOTE — Patient Instructions (Addendum)
Your fingers turn blue (not black) when they get cold because the blood vessels contract in cold weather  Your circulation if fine  Wear gloves to keep your fingers arm when the temperature is below 65 degrees,  And when you get food out of the refrigerator  Raynaud Phenomenon Raynaud phenomenon is a condition that affects the blood vessels (arteries) that carry blood to your fingers and toes. The arteries that supply blood to your ears or the tip of your nose might also be affected. Raynaud phenomenon causes the arteries to temporarily narrow. As a result, the flow of blood to the affected areas is temporarily decreased. This usually occurs in response to cold temperatures or stress. During an attack, the skin in the affected areas turns white. You may also feel tingling or numbness in those areas. Attacks usually last for only a brief period, and then the blood flow to the area returns to normal. In most cases, Raynaud phenomenon does not cause serious health problems. CAUSES  For many people with this condition, the cause is not known. Raynaud phenomenon is sometimes associated with other diseases, such as scleroderma or lupus. RISK FACTORS Raynaud phenomenon can affect anyone, but it develops most often in people who are 50-65 years old. It affects more females than males. SIGNS AND SYMPTOMS Symptoms of Raynaud phenomenon may occur when you are exposed to cold temperatures or when you have emotional stress. The symptoms may last for a few minutes or up to several hours. They usually affect your fingers but may also affect your toes, ears, or the tip of your nose. Symptoms may include:  Changes in skin color. The skin in the affected areas will turn pale or white. The skin may then change from white to bluish to red as normal blood flow returns to the area.  Numbness, tingling, or pain in the affected areas. In severe cases, sores may develop in the affected areas.  DIAGNOSIS  Your health  care provider will do a physical exam and take your medical history. You may be asked to put your hands in cold water to check for a reaction to cold temperature. Blood tests may be done to check for other diseases or conditions. Your health care provider may also order a test to check the movement of blood through your arteries and veins (vascular ultrasound). TREATMENT  Treatment often involves making lifestyle changes and taking steps to control your exposure to cold temperatures. For more severe cases, medicine (calcium channel blockers) may be used to improve blood flow. Surgery is sometimes done to block the nerves that control the affected arteries, but this is rare. HOME CARE INSTRUCTIONS   Avoid exposure to cold by taking these steps:  If possible, stay indoors during cold weather.  When you go outside during cold weather, dress in layers and wear mittens, a hat, a scarf, and warm footwear.  Wear mittens or gloves when handling ice or frozen food.  Use holders for glasses or cans containing cold drinks.  Let warm water run for a while before taking a shower or bath.  Warm up the car before driving in cold weather.  If possible, avoid stressful and emotional situations. Exercise, meditation, and yoga may help you cope with stress. Biofeedback may be useful.  Do not use any tobacco products, including cigarettes, chewing tobacco, or electronic cigarettes. If you need help quitting, ask your health care provider.  Avoid secondhand smoke.  Limit your use of caffeine. Switch to decaffeinated coffee,  tea, and soda. Avoid chocolate.  Wear loose fitting socks and comfortable, roomy shoes.  Avoid vibrating tools and machinery.  Take medicines only as directed by your health care provider. SEEK MEDICAL CARE IF:   Your discomfort becomes worse despite lifestyle changes.  You develop sores on your fingers or toes that do not heal.  Your fingers or toes turn black.  You have  breaks in the skin on your fingers or toes.  You have a fever.  You have pain or swelling in your joints.  You have a rash.  Your symptoms occur on only one side of your body.   This information is not intended to replace advice given to you by your health care provider. Make sure you discuss any questions you have with your health care provider.   Document Released: 12/20/1999 Document Revised: 01/12/2014 Document Reviewed: 07/27/2013 Elsevier Interactive Patient Education [employer].

## 2015-02-22 NOTE — Progress Notes (Signed)
Pre visit review using our clinic review tool, if applicable. No additional management support is needed unless otherwise documented below in the visit note. 

## 2015-02-22 NOTE — Progress Notes (Signed)
Subjective:  Patient ID: [name], female    DOB: [birth year]  Age: 82 y.o. MRN: [id number]  CC: The encounter diagnosis was Raynaud phenomenon.  HPI [name] presents for evaluation recurrent color changes to right thumb accompanied by temperature change and intermittent feeling of numbness. Denies pain in affected digit. First noticed while working outside in the yard . Symptoms are aggravated by cold weather.    Outpatient Prescriptions Prior to Visit  Medication Sig Dispense Refill  . aspirin 81 MG tablet Take 81 mg by mouth daily.      . Black Cohosh (BLACK COHOSH HOT FLASH RELIEF) 40 MG CAPS Take 1 capsule by mouth as needed.     . clorazepate (TRANXENE) 7.5 MG tablet TAKE ONE TABLET AT BEDTIME MAY REPEAT ONCE IF NEEDED 60 tablet 3  . docusate sodium (COLACE) 100 MG capsule Take 1 capsule (100 mg total) by mouth 2 (two) times daily. 60 capsule 11  . fluticasone (FLONASE) 50 MCG/ACT nasal spray Place 2 sprays into both nostrils daily as needed.    [name] GARLIC PO Take 1 tablet by mouth daily.    . hydrOXYzine (ATARAX/VISTARIL) 25 MG tablet TAKE ONE (1) CAPSULE THREE (3) TIMES EACH DAY AS NEEDED 90 tablet 3  . Iron-Vitamins (GERITOL PO) Take by mouth.      . lactulose (CHRONULAC) 10 GM/15ML solution TAKE 30MLS(0NE OUNCE) BY MOUTH 2 TIMES DAILY AS NEEDED FOR MILD CONSTIPATION. DO NOT USE MORE THAN ONCE A WEEK 240 mL 2  . Lutein 20 MG CAPS Take 20 mg by mouth daily.      . cephALEXin (KEFLEX) 500 MG capsule Take 1 capsule (500 mg total) by mouth 2 (two) times daily. (Patient not taking: Reported on 02/22/2015) 10 capsule 0   No facility-administered medications prior to visit.    Review of Systems;  Patient denies headache, fevers, malaise, unintentional weight loss, skin rash, eye pain, sinus congestion and sinus pain, sore throat, dysphagia,  hemoptysis , cough, dyspnea, wheezing, chest pain, palpitations, orthopnea, edema, abdominal pain, nausea, melena, diarrhea,  constipation, flank pain, dysuria, hematuria, urinary  Frequency, nocturia, numbness, tingling, seizures,  Focal weakness, Loss of consciousness,  Tremor, insomnia, depression, anxiety, and suicidal ideation.      Objective:  BP 118/80 mmHg  Pulse 75  Temp(Src) 97.6 F (36.4 C) (Oral)  Ht 5\' 5"  (1.651 m)  Wt 133 lb 8 oz (60.555 kg)  BMI 22.22 kg/m2  SpO2 99%  BP Readings from Last 3 Encounters:  02/22/15 118/80  02/06/15 128/76  01/30/15 122/76    Wt Readings from Last 3 Encounters:  02/22/15 133 lb 8 oz (60.555 kg)  02/06/15 134 lb (60.782 kg)  01/30/15 130 lb (58.968 kg)    General appearance: alert, cooperative and appears stated age Ears: normal TM's and external ear canals both ears Throat: lips, mucosa, and tongue normal; teeth and gums normal Neck: no adenopathy, no carotid bruit, supple, symmetrical, trachea midline and thyroid not enlarged, symmetric, no tenderness/mass/nodules Back: symmetric, no curvature. ROM normal. No CVA tenderness. Lungs: clear to auscultation bilaterally Heart: regular rate and rhythm, S1, S2 normal, no murmur, click, rub or gallop Abdomen: soft, non-tender; bowel sounds normal; no masses,  no organomegaly Pulses: 2+ and symmetric Skin: Skin color, texture, turgor normal. No rashes or lesions Lymph nodes: Cervical, supraclavicular, and axillary nodes normal.  No results found for: HGBA1C  Lab Results  Component Value Date   CREATININE 0.79 01/04/2015   CREATININE 0.84 06/13/2014  CREATININE 0.9 07/20/2013    Lab Results  Component Value Date   WBC 6.5 01/04/2015   HGB 12.3 01/04/2015   HCT 36.6 01/04/2015   PLT 255.0 01/04/2015   GLUCOSE 86 01/04/2015   CHOL 175 07/20/2013   TRIG 79.0 07/20/2013   HDL 37.70* 07/20/2013   LDLDIRECT 145.6 12/21/2012   LDLCALC 122* 07/20/2013   ALT 11 01/04/2015   AST 19 01/04/2015   NA 130* 01/04/2015   K 4.5 01/04/2015   CL 94* 01/04/2015   CREATININE 0.79 01/04/2015   BUN 15  01/04/2015   CO2 30 01/04/2015   TSH 1.92 01/04/2015    Mr Lumbar Spine Wo Contrast  10/03/2014  CLINICAL DATA:  82 year old female with pain radiating to the left buttock. L1 compression fracture in 2015. Subsequent encounter. EXAM: MRI LUMBAR SPINE WITHOUT CONTRAST TECHNIQUE: Multiplanar, multisequence MR imaging of the lumbar spine was performed. No intravenous contrast was administered. COMPARISON:  Lumbar MRI 10/14/2013. Lumbar radiographs 07/20/2013, and earlier. FINDINGS: Chronic levoconvex lumbar scoliosis and mild reversal of lumbar lordosis. Normal lumbar segmentation demonstrated on 07/20/2013. This is the same numbering system as on the MRI that year. L1 compression fracture re-identified and appears healed. No convincing residual marrow edema at that level. Height and alignment are stable since October. Visible lower thoracic levels remain intact. Lumbar vertebral height and alignment elsewhere appears stable. Degenerative endplate marrow edema at L2-L3 and L3-L4 re- identified and eccentric to the right. This has not significantly changed. The sacrum is poorly visualized on sagittal images today. Visualized sacrum on axial images appears intact. Visualized lower thoracic spinal cord is normal with conus medularis at T12. Visualized abdominal viscera and paraspinal soft tissues are within normal limits. Advanced widespread lumbar degenerative changes as described in 2015 appears stable. Among these, left side neural impingement appears maximal at L4-L5 (multifactorial moderate to severe left lateral recess and foraminal stenosis). IMPRESSION: 1. 2015 L1 compression fracture appears healed with stable loss of height and no adverse features. 2. Advanced lumbar scoliosis and degenerative findings are stable. As before, left side side neural impingement appears maximal at L4-L5. Electronically Signed   By: Genevie Ann M.D.   On: 10/03/2014 13:11    Assessment & Plan:   Problem List Items Addressed  This Visit    Raynaud phenomenon - Primary    Suggested by history and exam.  Arterial circulation appears to be fine at the wrist.  Cap refill is < 3 sec ,  Improves to < 2 sec when hands are warmed. Discussed diagnosis with her. Handwarmers and hand protection advised.          A total of 25 minutes of face to face time was spent with patient more than half of which was spent in counselling and coordination of care    I am having [name] maintain her aspirin, Iron-Vitamins (GERITOL PO), Lutein, Black Cohosh, fluticasone, docusate sodium, GARLIC PO, lactulose, hydrOXYzine, clorazepate, cephALEXin, diazepam, CRANBERRY CONCENTRATE PO, and prenatal multivitamin.  Meds ordered this encounter  Medications  . diazepam (VALIUM) 5 MG tablet    Sig: Take 5 mg by mouth every 12 (twelve) hours.    Refill:  3  . CRANBERRY CONCENTRATE PO    Sig: Take by mouth.  . Prenatal Vit-Fe Fumarate-FA (PRENATAL MULTIVITAMIN) TABS tablet    Sig: Take 1 tablet by mouth daily at 12 noon.    There are no discontinued medications.  Follow-up: No Follow-up on file.   Crecencio Mc, MD

## 2015-02-24 DIAGNOSIS — I7300 Raynaud's syndrome without gangrene: Secondary | ICD-10-CM | POA: Insufficient documentation

## 2015-02-24 NOTE — Assessment & Plan Note (Addendum)
Suggested by history and exam.  Arterial circulation appears to be fine at the wrist.  Cap refill is < 3 sec ,  Improves to < 2 sec when hands are warmed. Discussed diagnosis with her. Handwarmers and hand protection advised.

## 2015-02-25 DIAGNOSIS — D0462 Carcinoma in situ of skin of left upper limb, including shoulder: Secondary | ICD-10-CM | POA: Diagnosis not present

## 2015-02-25 DIAGNOSIS — L570 Actinic keratosis: Secondary | ICD-10-CM | POA: Diagnosis not present

## 2015-02-25 DIAGNOSIS — C44519 Basal cell carcinoma of skin of other part of trunk: Secondary | ICD-10-CM | POA: Diagnosis not present

## 2015-03-02 ENCOUNTER — Encounter: Payer: Self-pay | Admitting: Emergency Medicine

## 2015-03-02 ENCOUNTER — Emergency Department
Admission: EM | Admit: 2015-03-02 | Discharge: 2015-03-02 | Disposition: A | Discharge status: Home / Self Care | Payer: Medicare Other | Attending: Emergency Medicine | Admitting: Emergency Medicine

## 2015-03-02 DIAGNOSIS — Y9389 Activity, other specified: Secondary | ICD-10-CM | POA: Insufficient documentation

## 2015-03-02 DIAGNOSIS — Y998 Other external cause status: Secondary | ICD-10-CM | POA: Diagnosis not present

## 2015-03-02 DIAGNOSIS — E785 Hyperlipidemia, unspecified: Secondary | ICD-10-CM | POA: Diagnosis not present

## 2015-03-02 DIAGNOSIS — Z7982 Long term (current) use of aspirin: Secondary | ICD-10-CM | POA: Diagnosis not present

## 2015-03-02 DIAGNOSIS — F419 Anxiety disorder, unspecified: Secondary | ICD-10-CM | POA: Diagnosis not present

## 2015-03-02 DIAGNOSIS — W57XXXA Bitten or stung by nonvenomous insect and other nonvenomous arthropods, initial encounter: Secondary | ICD-10-CM | POA: Diagnosis not present

## 2015-03-02 DIAGNOSIS — F329 Major depressive disorder, single episode, unspecified: Secondary | ICD-10-CM | POA: Diagnosis not present

## 2015-03-02 DIAGNOSIS — Z79899 Other long term (current) drug therapy: Secondary | ICD-10-CM | POA: Diagnosis not present

## 2015-03-02 DIAGNOSIS — I10 Essential (primary) hypertension: Secondary | ICD-10-CM | POA: Diagnosis not present

## 2015-03-02 DIAGNOSIS — S30861A Insect bite (nonvenomous) of abdominal wall, initial encounter: Secondary | ICD-10-CM | POA: Diagnosis not present

## 2015-03-02 DIAGNOSIS — Y9289 Other specified places as the place of occurrence of the external cause: Secondary | ICD-10-CM | POA: Diagnosis not present

## 2015-03-02 MED ORDER — DOXYCYCLINE MONOHYDRATE 100 MG PO CAPS: 100.0000 mg | ORAL_CAPSULE | Freq: Two times a day (BID) | ORAL | Status: DC

## 2015-03-02 NOTE — ED Provider Notes (Signed)
[facility] Emergency Department Provider Note  ____________________________________________  Time seen: Approximately 4:00 PM  I have reviewed the triage vital signs and the nursing notes.   HISTORY  Chief Complaint Insect Bite    HPI [name] is a 82 y.o. female patient was bitten by a tick last night removed intact tick last night. Patient awakened this morning with some redness around the area. Patient denies any fever associated this complaint she denies any increased joint pain. He denies any other pain at this time. Except for removal of the tick no other palliative measures taken.   Past Medical History  Diagnosis Date  . Chronic orthostatic hypotension June 2012    with pprior syncopal event,  did not tolelrate florinef trial  . Anxiety   . Hyperlipidemia   . Depression   . Back pain     Patient Active Problem List   Diagnosis Date Noted  . Raynaud phenomenon 02/24/2015  . Melancholia 02/09/2015  . Hyponatremia 02/09/2015  . Urinary frequency 01/30/2015  . Medicare annual wellness visit, subsequent 01/06/2015  . Encounter for preventive health examination 01/06/2015  . Underweight 06/15/2014  . Chronic left hip pain 06/13/2014  . Essential and other specified forms of tremor 10/02/2013  . Constipation 08/12/2013  . Lethargy 07/22/2013  . Urinary incontinence, urge 05/29/2012  . Nocturnal leg cramps 12/15/2011  . Screening for breast cancer 10/27/2011  . Degeneration of lumbar or lumbosacral intervertebral disc 09/01/2011  . Insomnia 01/07/2011  . Hyperlipidemia LDL goal <130 11/08/2010  . Back pain, sacroiliac 11/08/2010  . Anxiety     Past Surgical History  Procedure Laterality Date  . Ankle surgery Left   . Wrist surgery Right   . Cataract extraction, bilateral      Current Outpatient Rx  Name  Route  Sig  Dispense  Refill  . aspirin 81 MG tablet   Oral   Take 81 mg by mouth daily.           . Black Cohosh  (BLACK COHOSH HOT FLASH RELIEF) 40 MG CAPS   Oral   Take 1 capsule by mouth as needed.          . cephALEXin (KEFLEX) 500 MG capsule   Oral   Take 1 capsule (500 mg total) by mouth 2 (two) times daily. Patient not taking: Reported on 02/22/2015   10 capsule   0   . clorazepate (TRANXENE) 7.5 MG tablet      TAKE ONE TABLET AT BEDTIME MAY REPEAT ONCE IF NEEDED   60 tablet   3   . CRANBERRY CONCENTRATE PO   Oral   Take by mouth.         . diazepam (VALIUM) 5 MG tablet   Oral   Take 5 mg by mouth every 12 (twelve) hours.      3   . docusate sodium (COLACE) 100 MG capsule   Oral   Take 1 capsule (100 mg total) by mouth 2 (two) times daily.   60 capsule   11   . fluticasone (FLONASE) 50 MCG/ACT nasal spray   Each Nare   Place 2 sprays into both nostrils daily as needed.         [name] GARLIC PO   Oral   Take 1 tablet by mouth daily.         . hydrOXYzine (ATARAX/VISTARIL) 25 MG tablet      TAKE ONE (1) CAPSULE THREE (3) TIMES EACH DAY AS  NEEDED   90 tablet   3   . Iron-Vitamins (GERITOL PO)   Oral   Take by mouth.           . lactulose (CHRONULAC) 10 GM/15ML solution      TAKE 30MLS(0NE OUNCE) BY MOUTH 2 TIMES DAILY AS NEEDED FOR MILD CONSTIPATION. DO NOT USE MORE THAN ONCE A WEEK   240 mL   2   . Lutein 20 MG CAPS   Oral   Take 20 mg by mouth daily.           . Prenatal Vit-Fe Fumarate-FA (PRENATAL MULTIVITAMIN) TABS tablet   Oral   Take 1 tablet by mouth daily at 12 noon.           Allergies Review of patient's allergies indicates no known allergies.  Family History  Problem Relation Age of Onset  . Cancer Neg Hx   . Diabetes Neg Hx   . Heart disease Neg Hx     Social History Social History  Substance Use Topics  . Smoking status: Never Smoker   . Smokeless tobacco: Never Used  . Alcohol Use: No    Review of Systems Constitutional: No fever/chills Eyes: No visual changes. ENT: No sore throat. Cardiovascular: Denies chest  pain. Respiratory: Denies shortness of breath. Gastrointestinal: No abdominal pain.  No nausea, no vomiting.  No diarrhea.  No constipation. Genitourinary: Negative for dysuria. Musculoskeletal: Negative for back pain. Skin: Redness around insect bite site  Neurological: Negative for headaches, focal weakness or numbness. Psychiatric:Anxiety and depression Endocrine: Hyperlipidemia and hypertension. 10-point ROS otherwise negative.  ____________________________________________   PHYSICAL EXAM:  VITAL SIGNS: ED Triage Vitals  Enc Vitals Group     BP 03/02/15 1544 176/74 mmHg     Pulse Rate 03/02/15 1544 67     Resp 03/02/15 1544 18     Temp 03/02/15 1544 97.9 F (36.6 C)     Temp Source 03/02/15 1544 Oral     SpO2 03/02/15 1544 98 %     Weight 03/02/15 1544 133 lb (60.328 kg)     Height 03/02/15 1544 5\' 6"  (1.676 m)     Head Cir --      Peak Flow --      Pain Score 03/02/15 1543 0     Pain Loc --      Pain Edu? --      Excl. in [place]? --     Constitutional: Alert and oriented. Well appearing and in no acute distress. Eyes: Conjunctivae are normal. PERRL. EOMI. Head: Atraumatic. Nose: No congestion/rhinnorhea. Mouth/Throat: Mucous membranes are moist.  Oropharynx non-erythematous. Neck: No stridor.  No cervical spine tenderness to palpation. Hematological/Lymphatic/Immunilogical: No cervical lymphadenopathy. Cardiovascular: Normal rate, regular rhythm. Grossly normal heart sounds.  Good peripheral circulation. Respiratory: Normal respiratory effort.  No retractions. Lungs CTAB. Gastrointestinal: Soft and nontender. No distention. No abdominal bruits. No CVA tenderness. Musculoskeletal: No lower extremity tenderness nor edema.  No joint effusions. Neurologic:  Normal speech and language. No gross focal neurologic deficits are appreciated. No gait instability. Skin:  Skin is warm, dry and intact. Papular lesion on erythematous base lower abdomen. Psychiatric: Mood and  affect are normal. Speech and behavior are normal.  ____________________________________________   LABS (all labs ordered are listed, but only abnormal results are displayed)  Labs Reviewed - No data to display ____________________________________________  EKG   ____________________________________________  RADIOLOGY   ____________________________________________   PROCEDURES  Procedure(s) performed: None  Critical Care performed: No  ____________________________________________   INITIAL IMPRESSION / ASSESSMENT AND PLAN / ED COURSE  Pertinent labs & imaging results that were available during my care of the patient were reviewed by me and considered in my medical decision making (see chart for details).  Tick bite. Patient given discharge Instructions. Patient given a prescription for doxycycline to take as directed. Patient advised follow-up family doctor if her condition worsens. ____________________________________________   FINAL CLINICAL IMPRESSION(S) / ED DIAGNOSES  Final diagnoses:  None      Sable Feil, PA-C 03/02/15 1612  Daymon Larsen, MD 03/02/15 [phone]

## 2015-03-02 NOTE — Discharge Instructions (Signed)
Tick Bite Information Ticks are insects that attach themselves to the skin and draw blood for food. There are various types of ticks. Common types include wood ticks and deer ticks. Most ticks live in shrubs and grassy areas. Ticks can climb onto your body when you make contact with leaves or grass where the tick is waiting. The most common places on the body for ticks to attach themselves are the scalp, neck, armpits, waist, and groin. Most tick bites are harmless, but sometimes ticks carry germs that cause diseases. These germs can be spread to a person during the tick's feeding process. The chance of a disease spreading through a tick bite depends on:   The type of tick.  Time of year.   How long the tick is attached.   Geographic location.  HOW CAN YOU PREVENT TICK BITES? Take these steps to help prevent tick bites when you are outdoors:  Wear protective clothing. Long sleeves and long pants are best.   Wear white clothes so you can see ticks more easily.  Tuck your pant legs into your socks.   If walking on a trail, stay in the middle of the trail to avoid brushing against bushes.  Avoid walking through areas with long grass.  Put insect repellent on all exposed skin and along boot tops, pant legs, and sleeve cuffs.   Check clothing, hair, and skin repeatedly and before going inside.   Brush off any ticks that are not attached.  Take a shower or bath as soon as possible after being outdoors.  WHAT IS THE PROPER WAY TO REMOVE A TICK? Ticks should be removed as soon as possible to help prevent diseases caused by tick bites. 1. If latex gloves are available, put them on before trying to remove a tick.  2. Using fine-point tweezers, grasp the tick as close to the skin as possible. You may also use curved forceps or a tick removal tool. Grasp the tick as close to its head as possible. Avoid grasping the tick on its body. 3. Pull gently with steady upward pressure until  the tick lets go. Do not twist the tick or jerk it suddenly. This may break off the tick's head or mouth parts. 4. Do not squeeze or crush the tick's body. This could force disease-carrying fluids from the tick into your body.  5. After the tick is removed, wash the bite area and your hands with soap and water or other disinfectant such as alcohol. 6. Apply a small amount of antiseptic cream or ointment to the bite site.  7. Wash and disinfect any instruments that were used.  Do not try to remove a tick by applying a hot match, petroleum jelly, or fingernail polish to the tick. These methods do not work and may increase the chances of disease being spread from the tick bite.  WHEN SHOULD YOU SEEK MEDICAL CARE? Contact your health care provider if you are unable to remove a tick from your skin or if a part of the tick breaks off and is stuck in the skin.  After a tick bite, you need to be aware of signs and symptoms that could be related to diseases spread by ticks. Contact your health care provider if you develop any of the following in the days or weeks after the tick bite:  Unexplained fever.  Rash. A circular rash that appears days or weeks after the tick bite may indicate the possibility of Lyme disease. The rash may resemble   a target with a bull's-eye and may occur at a different part of your body than the tick bite.  Redness and swelling in the area of the tick bite.   Tender, swollen lymph glands.   Diarrhea.   Weight loss.   Cough.   Fatigue.   Muscle, joint, or bone pain.   Abdominal pain.   Headache.   Lethargy or a change in your level of consciousness.  Difficulty walking or moving your legs.   Numbness in the legs.   Paralysis.  Shortness of breath.   Confusion.   Repeated vomiting.    This information is not intended to replace advice given to you by your health care provider. Make sure you discuss any questions you have with your health  care provider.   Document Released: 12/20/1999 Document Revised: 01/12/2014 Document Reviewed: 06/01/2012 Elsevier Interactive Patient Education 2016 [employer]  

## 2015-03-02 NOTE — ED Notes (Signed)
Pt states was bitten by a tick last night. Pt presents with intact tick in a jar. Pt points to small bump on upper abdomen where she states she removed the tick from. Area surrounding lesion in slightly reddened.

## 2015-03-02 NOTE — ED Notes (Signed)
Pt states she pulled a tick off her abd last pm, wants to be checked.  Small red area noted.

## 2015-03-04 ENCOUNTER — Telehealth: Payer: Self-pay | Admitting: *Deleted

## 2015-03-04 NOTE — Telephone Encounter (Signed)
Pt was seen in the ER for a tick bit and needs a hospital f/u. She has an appt for 03/19/15 can we bring her in sooner. Please advise, thanks

## 2015-03-04 NOTE — Telephone Encounter (Signed)
Patient was seen in the ER, she had a tick removed 03/02/15. She was advised to follow up with her PCP this week. She will also like to discuss kidney function. She stated that at times she feels she has to urinate, and at other times she has to rush to the restroom. Please advise a  Place on Dr.Tullo schedule for this week. Pt will not be home on 03/05/15 Pt. [phone]

## 2015-03-05 NOTE — Telephone Encounter (Signed)
Tried calling pt unable to leave message

## 2015-03-05 NOTE — Telephone Encounter (Signed)
11:30 on thursday

## 2015-03-05 NOTE — Telephone Encounter (Signed)
LM with family member to call the office to confirm appt offered by Dr. Derrel Nip

## 2015-03-05 NOTE — Telephone Encounter (Signed)
Pt scheduled  

## 2015-03-07 ENCOUNTER — Encounter: Payer: Self-pay | Admitting: Internal Medicine

## 2015-03-07 ENCOUNTER — Ambulatory Visit (INDEPENDENT_AMBULATORY_CARE_PROVIDER_SITE_OTHER): Payer: Medicare Other | Admitting: Internal Medicine

## 2015-03-07 VITALS — BP 140/74 | HR 71 | Temp 97.5°F | Resp 12 | Ht 66.0 in | Wt 134.5 lb

## 2015-03-07 DIAGNOSIS — T673XXD Heat exhaustion, anhydrotic, subsequent encounter: Secondary | ICD-10-CM | POA: Diagnosis not present

## 2015-03-07 DIAGNOSIS — S30861A Insect bite (nonvenomous) of abdominal wall, initial encounter: Secondary | ICD-10-CM

## 2015-03-07 DIAGNOSIS — R350 Frequency of micturition: Secondary | ICD-10-CM | POA: Diagnosis not present

## 2015-03-07 DIAGNOSIS — W57XXXA Bitten or stung by nonvenomous insect and other nonvenomous arthropods, initial encounter: Secondary | ICD-10-CM | POA: Diagnosis not present

## 2015-03-07 DIAGNOSIS — S30861D Insect bite (nonvenomous) of abdominal wall, subsequent encounter: Secondary | ICD-10-CM

## 2015-03-07 DIAGNOSIS — W57XXXD Bitten or stung by nonvenomous insect and other nonvenomous arthropods, subsequent encounter: Secondary | ICD-10-CM

## 2015-03-07 NOTE — Progress Notes (Signed)
Subjective:  Patient ID: [name], female    DOB: 08-25-1926  Age: 82 y.o. MRN: [id number]  CC: The primary encounter diagnosis was Urinary frequency. Diagnoses of Tick bite of abdominal wall, subsequent encounter and Heat exhaustion due to water depletion, subsequent encounter were also pertinent to this visit.  HPI [name] presents for ER follow up  For tick bite on Feb 29th,  Removed the tick on Feb 28th,  The following morning noticed redness to a raised area on abdomen .  Was given an rx for doxycycline   BP was elevated at visit systolic [id number] .  She has no history of hypertension  [facility] Dermatology Dr Baxter Kail (took over Dr [facility] practice on [address])  Urinary frequency without incontinence   Had an episode  of recurrent nausea and recent emesis which is brought on with physical exertion ( working in the yard  [employer],  Raking leaves)  Does not want a cardiac workup,. Think it is related ot left hip /back pain    Outpatient Prescriptions Prior to Visit  Medication Sig Dispense Refill  . aspirin 81 MG tablet Take 81 mg by mouth daily.      . Black Cohosh (BLACK COHOSH HOT FLASH RELIEF) 40 MG CAPS Take 1 capsule by mouth as needed.     . clorazepate (TRANXENE) 7.5 MG tablet TAKE ONE TABLET AT BEDTIME MAY REPEAT ONCE IF NEEDED 60 tablet 3  . CRANBERRY CONCENTRATE PO Take by mouth.    . diazepam (VALIUM) 5 MG tablet Take 5 mg by mouth every 12 (twelve) hours.  3  . docusate sodium (COLACE) 100 MG capsule Take 1 capsule (100 mg total) by mouth 2 (two) times daily. 60 capsule 11  . doxycycline (MONODOX) 100 MG capsule Take 1 capsule (100 mg total) by mouth 2 (two) times daily. 20 capsule 0  . fluticasone (FLONASE) 50 MCG/ACT nasal spray Place 2 sprays into both nostrils daily as needed.    [name] GARLIC PO Take 1 tablet by mouth daily.    . hydrOXYzine (ATARAX/VISTARIL) 25 MG tablet TAKE ONE (1) CAPSULE THREE (3) TIMES EACH DAY AS NEEDED 90 tablet 3  .  Iron-Vitamins (GERITOL PO) Take by mouth.      . lactulose (CHRONULAC) 10 GM/15ML solution TAKE 30MLS(0NE OUNCE) BY MOUTH 2 TIMES DAILY AS NEEDED FOR MILD CONSTIPATION. DO NOT USE MORE THAN ONCE A WEEK 240 mL 2  . Lutein 20 MG CAPS Take 20 mg by mouth daily.      . Prenatal Vit-Fe Fumarate-FA (PRENATAL MULTIVITAMIN) TABS tablet Take 1 tablet by mouth daily at 12 noon.    . cephALEXin (KEFLEX) 500 MG capsule Take 1 capsule (500 mg total) by mouth 2 (two) times daily. (Patient not taking: Reported on 03/07/2015) 10 capsule 0   No facility-administered medications prior to visit.    Review of Systems;  Patient denies headache, fevers, malaise, unintentional weight loss, skin rash, eye pain, sinus congestion and sinus pain, sore throat, dysphagia,  hemoptysis , cough, dyspnea, wheezing, chest pain, palpitations, orthopnea, edema, abdominal pain, nausea, melena, diarrhea, constipation, flank pain, dysuria, hematuria, urinary  Frequency, nocturia, numbness, tingling, seizures,  Focal weakness, Loss of consciousness,  Tremor, insomnia, depression, anxiety, and suicidal ideation.      Objective:  BP 140/74 mmHg  Pulse 71  Temp(Src) 97.5 F (36.4 C) (Oral)  Resp 12  Ht 5\' 6"  (1.676 m)  Wt 134 lb 8 oz (61.009 kg)  BMI  21.72 kg/m2  SpO2 99%  BP Readings from Last 3 Encounters:  03/07/15 140/74  03/02/15 176/74  02/22/15 118/80    Wt Readings from Last 3 Encounters:  03/07/15 134 lb 8 oz (61.009 kg)  03/02/15 133 lb (60.328 kg)  02/22/15 133 lb 8 oz (60.555 kg)    General appearance: alert, cooperative and appears stated age Ears: normal TM's and external ear canals both ears Throat: lips, mucosa, and tongue normal; teeth and gums normal Neck: no adenopathy, no carotid bruit, supple, symmetrical, trachea midline and thyroid not enlarged, symmetric, no tenderness/mass/nodules Back: symmetric, no curvature. ROM normal. No CVA tenderness. Lungs: clear to auscultation bilaterally Heart:  regular rate and rhythm, S1, S2 normal, no murmur, click, rub or gallop Abdomen: soft, non-tender; bowel sounds normal; no masses,  no organomegaly Pulses: 2+ and symmetric Skin: Skin color, texture, turgor normal. No rashes or lesions Lymph nodes: Cervical, supraclavicular, and axillary nodes normal.  No results found for: HGBA1C  Lab Results  Component Value Date   CREATININE 0.79 01/04/2015   CREATININE 0.84 06/13/2014   CREATININE 0.9 07/20/2013    Lab Results  Component Value Date   WBC 6.5 01/04/2015   HGB 12.3 01/04/2015   HCT 36.6 01/04/2015   PLT 255.0 01/04/2015   GLUCOSE 86 01/04/2015   CHOL 175 07/20/2013   TRIG 79.0 07/20/2013   HDL 37.70* 07/20/2013   LDLDIRECT 145.6 12/21/2012   LDLCALC 122* 07/20/2013   ALT 11 01/04/2015   AST 19 01/04/2015   NA 130* 01/04/2015   K 4.5 01/04/2015   CL 94* 01/04/2015   CREATININE 0.79 01/04/2015   BUN 15 01/04/2015   CO2 30 01/04/2015   TSH 1.92 01/04/2015    No results found.  Assessment & Plan:   Problem List Items Addressed This Visit    Urinary frequency - Primary    Reviewed risks and benefits of medications to treat frequency. Advised supportive care.         Tick bite of abdominal wall    continue doxycycline and probiotic.       Heat exhaustion due to water depletion    Recurrent , during work outside.  Advised to continue use of Gatorade         I have discontinued [name]'s cephALEXin. I am also having her maintain her aspirin, Iron-Vitamins (GERITOL PO), Lutein, Black Cohosh, fluticasone, docusate sodium, GARLIC PO, lactulose, hydrOXYzine, clorazepate, diazepam, CRANBERRY CONCENTRATE PO, prenatal multivitamin, and doxycycline.  No orders of the defined types were placed in this encounter.    Medications Discontinued During This Encounter  Medication Reason  . cephALEXin (KEFLEX) 500 MG capsule Completed Course    Follow-up: No Follow-up on file.   Crecencio Mc, MD

## 2015-03-07 NOTE — Progress Notes (Signed)
Pre-visit discussion using our clinic review tool. No additional management support is needed unless otherwise documented below in the visit note.  

## 2015-03-09 DIAGNOSIS — T673XXA Heat exhaustion, anhydrotic, initial encounter: Secondary | ICD-10-CM | POA: Insufficient documentation

## 2015-03-09 DIAGNOSIS — S30861A Insect bite (nonvenomous) of abdominal wall, initial encounter: Secondary | ICD-10-CM | POA: Insufficient documentation

## 2015-03-09 DIAGNOSIS — W57XXXA Bitten or stung by nonvenomous insect and other nonvenomous arthropods, initial encounter: Secondary | ICD-10-CM

## 2015-03-09 NOTE — Assessment & Plan Note (Signed)
continue doxycycline and probiotic.

## 2015-03-09 NOTE — Assessment & Plan Note (Signed)
Reviewed risks and benefits of medications to treat frequency. Advised supportive care.

## 2015-03-09 NOTE — Assessment & Plan Note (Signed)
Recurrent , during work outside.  Advised to continue ue of Gatorade

## 2015-03-12 DIAGNOSIS — M898X9 Other specified disorders of bone, unspecified site: Secondary | ICD-10-CM | POA: Diagnosis not present

## 2015-03-12 DIAGNOSIS — Q6621 Congenital metatarsus primus varus: Secondary | ICD-10-CM | POA: Diagnosis not present

## 2015-03-13 ENCOUNTER — Telehealth: Payer: Self-pay | Admitting: Internal Medicine

## 2015-03-13 DIAGNOSIS — M810 Age-related osteoporosis without current pathological fracture: Secondary | ICD-10-CM | POA: Insufficient documentation

## 2015-03-13 NOTE — Telephone Encounter (Signed)
Line busy no voicemail will continue to call.

## 2015-03-13 NOTE — Telephone Encounter (Signed)
Bone Density scores received, she has bone loss which is significant and diagnostic of osteoporosis.    I recommend treating with medication but would need to discuss the pros and cons in an office visit continue calcium with a goal of 1800 mg daily through diet and supplements ,  2000 units of vitamin d daily,  and weight bearing exercise on a regular basis, and make appt to discuss treatment options

## 2015-03-14 NOTE — Telephone Encounter (Signed)
Patient notified and voiced understanding.

## 2015-03-19 ENCOUNTER — Ambulatory Visit: Payer: Medicare Other | Admitting: Internal Medicine

## 2015-03-27 DIAGNOSIS — D0462 Carcinoma in situ of skin of left upper limb, including shoulder: Secondary | ICD-10-CM | POA: Diagnosis not present

## 2015-03-28 ENCOUNTER — Telehealth: Payer: Self-pay | Admitting: Internal Medicine

## 2015-03-28 MED ORDER — CLORAZEPATE DIPOTASSIUM 7.5 MG PO TABS: ORAL_TABLET | ORAL | Status: DC

## 2015-03-28 NOTE — Telephone Encounter (Signed)
Notified pt. 

## 2015-03-28 NOTE — Telephone Encounter (Signed)
clorazepate (TRANXENE) 7.5 MG tablet

## 2015-03-28 NOTE — Telephone Encounter (Signed)
Refill authorized and printed 

## 2015-03-28 NOTE — Telephone Encounter (Signed)
Pt is requesting a refill on clorazepate (TRANXENE) 7.5 MG tablet. Pt's last OV 03/07/15, last filled 09/04/14. Please advise, thanks

## 2015-04-01 ENCOUNTER — Ambulatory Visit (INDEPENDENT_AMBULATORY_CARE_PROVIDER_SITE_OTHER): Payer: Medicare Other | Admitting: Internal Medicine

## 2015-04-01 VITALS — BP 126/68 | HR 71 | Temp 97.8°F | Resp 12 | Ht 66.0 in | Wt 133.5 lb

## 2015-04-01 DIAGNOSIS — M810 Age-related osteoporosis without current pathological fracture: Secondary | ICD-10-CM | POA: Diagnosis not present

## 2015-04-01 DIAGNOSIS — Z5189 Encounter for other specified aftercare: Secondary | ICD-10-CM

## 2015-04-01 DIAGNOSIS — M25552 Pain in left hip: Secondary | ICD-10-CM | POA: Diagnosis not present

## 2015-04-01 DIAGNOSIS — G4700 Insomnia, unspecified: Secondary | ICD-10-CM

## 2015-04-01 DIAGNOSIS — G8929 Other chronic pain: Secondary | ICD-10-CM

## 2015-04-01 MED ORDER — DICLOFENAC SODIUM ER 100 MG PO TB24: 100.0000 mg | ORAL_TABLET | Freq: Every day | ORAL | Status: DC

## 2015-04-01 MED ORDER — DIAZEPAM 5 MG PO TABS: 5.0000 mg | ORAL_TABLET | Freq: Three times a day (TID) | ORAL | Status: DC | PRN

## 2015-04-01 MED ORDER — TRAMADOL HCL 50 MG PO TABS: 50.0000 mg | ORAL_TABLET | Freq: Three times a day (TID) | ORAL | Status: DC | PRN

## 2015-04-01 NOTE — Patient Instructions (Addendum)
Your Bone Density scores have been received, and you have osteoporisis  I am advising you to start the medication that is given  twice  a year as an injection called Prolia.  It  Has been very well tolerated and has an excellent safety profile.    I am going to to try to obtain Prior authorization for Prolia  from your insurance.   Please try  to get 1800 mg  Of calcium daily ;  I recommend getting the majority of your calcium and Vitamin D  through diet rather than supplements given the recent association of calcium supplements with increased coronary artery calcium scores  You need 1000 IUs of D3 daily .   You can take the diazepam every 8 hours as needed for muscle spasms and for insomnia  .  Do not combine with clorazepate though, use one or the other at bedtime.  I am also prescribing once daily diclofenac,  An anti inflammatory that takes the place of Motrin and is much stronger  take once daily with a meal .  You can also add tylenol and tramadol according to the directions,  As needed for low back and hip pain .

## 2015-04-01 NOTE — Progress Notes (Signed)
Pre-visit discussion using our clinic review tool. No additional management support is needed unless otherwise documented below in the visit note.  

## 2015-04-01 NOTE — Progress Notes (Signed)
Subjective:  Patient ID: [name], female    DOB: 06-12-1926  Age: 82 y.o. MRN: [id number]  CC: The primary encounter diagnosis was Osteoporosis. Diagnoses of Visit for wound check, Chronic left hip pain, and Insomnia were also pertinent to this visit.  HPI  [name] presents for discussion of  Recent DEXA scan indicating osteoporosis . Patient had DEXA scan  Last month which was diagnostic  With T scores of -3.4 .  She has no history of fractures or treatment of osteoporosis.  .  She has chronic back pain and left buttock pain due to spinal stenosis and DDD which has intermittently responded to PT.  She has not had any falls recently and continues to live alone since the death of her husband several years ago. Discussed etiology and treatment options for osteoporosis .  Fosamax c/i secondary to dysphagia   Insomnia:  She gets some relief of muscle spasm with  prn use of  valium, and wonders if she can use this at night insteadof chlorazepate.    Saw Dermatologist Molli Barrows  And she had  4 skin cancers removed last week. Some ofthe resulting wounds have had some irritation and redness and she has asked for an examination of her skin today.   Wounds examined today and noted to be covered in slough and dry appearing.     Outpatient Prescriptions Prior to Visit  Medication Sig Dispense Refill  . aspirin 81 MG tablet Take 81 mg by mouth daily.      . Black Cohosh (BLACK COHOSH HOT FLASH RELIEF) 40 MG CAPS Take 1 capsule by mouth as needed.     . clorazepate (TRANXENE) 7.5 MG tablet TAKE ONE TABLET AT BEDTIME MAY REPEAT ONCE IF NEEDED 60 tablet 3  . CRANBERRY CONCENTRATE PO Take by mouth.    . docusate sodium (COLACE) 100 MG capsule Take 1 capsule (100 mg total) by mouth 2 (two) times daily. 60 capsule 11  . fluticasone (FLONASE) 50 MCG/ACT nasal spray Place 2 sprays into both nostrils daily as needed.    . hydrOXYzine (ATARAX/VISTARIL) 25 MG tablet TAKE ONE (1) CAPSULE THREE (3)  TIMES EACH DAY AS NEEDED 90 tablet 3  . Iron-Vitamins (GERITOL PO) Take by mouth.      . lactulose (CHRONULAC) 10 GM/15ML solution TAKE 30MLS(0NE OUNCE) BY MOUTH 2 TIMES DAILY AS NEEDED FOR MILD CONSTIPATION. DO NOT USE MORE THAN ONCE A WEEK 240 mL 2  . Lutein 20 MG CAPS Take 20 mg by mouth daily.      . diazepam (VALIUM) 5 MG tablet Take 5 mg by mouth every 12 (twelve) hours.  3  . doxycycline (MONODOX) 100 MG capsule Take 1 capsule (100 mg total) by mouth 2 (two) times daily. 20 capsule 0  . GARLIC PO Take 1 tablet by mouth daily.    . Prenatal Vit-Fe Fumarate-FA (PRENATAL MULTIVITAMIN) TABS tablet Take 1 tablet by mouth daily at 12 noon.     No facility-administered medications prior to visit.    Review of Systems;  Patient denies headache, fevers, malaise, unintentional weight loss, skin rash, eye pain, sinus congestion and sinus pain, sore throat, dysphagia,  hemoptysis , cough, dyspnea, wheezing, chest pain, palpitations, orthopnea, edema, abdominal pain, nausea, melena, diarrhea, constipation, flank pain, dysuria, hematuria, urinary  Frequency, nocturia, numbness, tingling, seizures,  Focal weakness, Loss of consciousness,  Tremor, insomnia, depression, anxiety, and suicidal ideation.      Objective:  BP 126/68 mmHg  Pulse  71  Temp(Src) 97.8 F (36.6 C) (Oral)  Resp 12  Ht 5\' 6"  (1.676 m)  Wt 133 lb 8 oz (60.555 kg)  BMI 21.56 kg/m2  SpO2 98%  BP Readings from Last 3 Encounters:  04/01/15 126/68  03/07/15 140/74  03/02/15 176/74    Wt Readings from Last 3 Encounters:  04/01/15 133 lb 8 oz (60.555 kg)  03/07/15 134 lb 8 oz (61.009 kg)  03/02/15 133 lb (60.328 kg)    General appearance: alert, cooperative and appears stated age Back: symmetric, kypotic, e. ROM normal. No CVA tenderness. Lungs: clear to auscultation bilaterally Heart: regular rate and rhythm, S1, S2 normal, no murmur, click, rub or gallop Abdomen: soft, non-tender; bowel sounds normal; no masses,  no  organomegaly Pulses: 2+ and symmetric Skin: 4 surgical wounds with adherent slough, mild erythema NOT SUGGESTIVE of cellulitis.  Lymph nodes: Cervical, supraclavicular, and axillary nodes normal.  No results found for: HGBA1C  Lab Results  Component Value Date   CREATININE 0.79 01/04/2015   CREATININE 0.84 06/13/2014   CREATININE 0.9 07/20/2013    Lab Results  Component Value Date   WBC 6.5 01/04/2015   HGB 12.3 01/04/2015   HCT 36.6 01/04/2015   PLT 255.0 01/04/2015   GLUCOSE 86 01/04/2015   CHOL 175 07/20/2013   TRIG 79.0 07/20/2013   HDL 37.70* 07/20/2013   LDLDIRECT 145.6 12/21/2012   LDLCALC 122* 07/20/2013   ALT 11 01/04/2015   AST 19 01/04/2015   NA 130* 01/04/2015   K 4.5 01/04/2015   CL 94* 01/04/2015   CREATININE 0.79 01/04/2015   BUN 15 01/04/2015   CO2 30 01/04/2015   TSH 1.92 01/04/2015    No results found.  Assessment & Plan:   Problem List Items Addressed This Visit    Insomnia    Discussed simplifying her regimen and using valium instead of chlorazepate. The risks and benefits of benzodiazepine use were discussed with patient today including excessive sedation leading to respiratory depression,  impaired thinking/driving, and addiction.  Patient was advised to avoid concurrent use with alcohol, to use medication only as needed and not to share with others  .       Chronic left hip pain    Trial of diclofenac , prn tramadol, and tylenol.       Osteoporosis - Primary    Discussed treatment options,  Calcium and Vit d requirements.  Prolia advised given her age and contraindication to fosomax given her dysphagia.       Visit for wound check    Recent dermatologic procedures resulting in 4 dime sized wounds covered in slough,  No erythema.  Advised to keep them covered with vaseline and non stick telfa          I have discontinued Ms. Fitzsimons's GARLIC PO, prenatal multivitamin, and doxycycline. I have also changed her diazepam. Additionally, I  am having her start on traMADol and Diclofenac Sodium CR. Lastly, I am having her maintain her aspirin, Iron-Vitamins (GERITOL PO), Lutein, Black Cohosh, fluticasone, docusate sodium, lactulose, hydrOXYzine, CRANBERRY CONCENTRATE PO, and clorazepate.  Meds ordered this encounter  Medications  . traMADol (ULTRAM) 50 MG tablet    Sig: Take 1 tablet (50 mg total) by mouth every 8 (eight) hours as needed.    Dispense:  90 tablet    Refill:  2  . Diclofenac Sodium CR 100 MG 24 hr tablet    Sig: Take 1 tablet (100 mg total) by mouth daily. With a meal  for back pain    Dispense:  30 tablet    Refill:  3  . diazepam (VALIUM) 5 MG tablet    Sig: Take 1 tablet (5 mg total) by mouth every 8 (eight) hours as needed for anxiety.    Dispense:  90 tablet    Refill:  3   A total of 25 minutes of face to face time was spent with patient more than half of which was spent in counselling about the above mentioned conditions  and coordination of care   Medications Discontinued During This Encounter  Medication Reason  . doxycycline (MONODOX) 100 MG capsule   . Prenatal Vit-Fe Fumarate-FA (PRENATAL MULTIVITAMIN) TABS tablet   . GARLIC PO   . diazepam (VALIUM) 5 MG tablet Reorder    Follow-up: No Follow-up on file.   Crecencio Mc, MD

## 2015-04-02 DIAGNOSIS — Z5189 Encounter for other specified aftercare: Secondary | ICD-10-CM | POA: Insufficient documentation

## 2015-04-02 NOTE — Assessment & Plan Note (Addendum)
Trial of diclofenac , prn tramadol, and tylenol.

## 2015-04-02 NOTE — Assessment & Plan Note (Signed)
Discussed treatment options,  Calcium and Vit d requirements.  Prolia advised given her age and contraindication to fosomax given her dysphagia.

## 2015-04-02 NOTE — Assessment & Plan Note (Addendum)
Discussed simplifying her regimen and using valium instead of chlorazepate. The risks and benefits of benzodiazepine use were discussed with patient today including excessive sedation leading to respiratory depression,  impaired thinking/driving, and addiction.  Patient was advised to avoid concurrent use with alcohol, to use medication only as needed and not to share with others  .

## 2015-04-02 NOTE — Assessment & Plan Note (Signed)
Recent dermatologic procedures resulting in 4 dime sized wounds covered in slough,  No erythema.  Advised to keep them covered with vaseline and non stick telfa

## 2015-04-10 ENCOUNTER — Telehealth: Payer: Self-pay | Admitting: Internal Medicine

## 2015-04-10 NOTE — Telephone Encounter (Signed)
I have electronically submitted pt's info for Prolia insurance verification and will notify you once I have a response. Thank you. °

## 2015-04-16 ENCOUNTER — Telehealth: Payer: Self-pay | Admitting: Surgical

## 2015-04-16 NOTE — Telephone Encounter (Signed)
FYI: patient walked into office today because she had to 3 moles removed on 03/25/15 by Dermatology. Two of the three moles are not healing like they should per patient. After looking at the moles I scheduled her an appointment with Dr. Caryl Bis 04/17/15 due to having no open appointments available in the office today. When looking at the moles I did not notice any redness or pus draining. Patient stated that Dr Derrel Nip has referred her to the wound clinic for this before.

## 2015-04-17 ENCOUNTER — Encounter: Payer: Self-pay | Admitting: Family Medicine

## 2015-04-17 ENCOUNTER — Ambulatory Visit (INDEPENDENT_AMBULATORY_CARE_PROVIDER_SITE_OTHER): Payer: Medicare Other | Admitting: Family Medicine

## 2015-04-17 VITALS — BP 106/68 | HR 71 | Temp 98.4°F | Ht 66.0 in | Wt 134.8 lb

## 2015-04-17 DIAGNOSIS — R238 Other skin changes: Secondary | ICD-10-CM | POA: Diagnosis not present

## 2015-04-17 DIAGNOSIS — T148XXA Other injury of unspecified body region, initial encounter: Secondary | ICD-10-CM | POA: Insufficient documentation

## 2015-04-17 NOTE — Assessment & Plan Note (Signed)
Patient with slowly healing wounds in the areas of biopsies on her right lower extremity and right posterior shoulder. There are no signs of infection. Discussed referral to wound center as these have been slow to heal. Discussed applying Vaseline every other day. Will keep covered when she is up and about though when at home and inactive can leave open to air for brief periods of time. Monitor for signs and symptoms of infection. Given return precautions.

## 2015-04-17 NOTE — Progress Notes (Signed)
Pre visit review using our clinic review tool, if applicable. No additional management support is needed unless otherwise documented below in the visit note. 

## 2015-04-17 NOTE — Patient Instructions (Signed)
Nice to meet you. We are going to refer you to the wound center. You should keep the areas uncovered while you are sitting around at home though can keep them covered if you go out or are active. You can apply Vaseline every other day. You should wash with soap and water. If you develop redness, fevers, pain, drainage, or any new or changing symptoms please seek medical attention.

## 2015-04-17 NOTE — Progress Notes (Signed)
Patient ID: [name], female   DOB: [birth year], 82 y.o.   MRN: [id number]  Gwendolyn Rumps, MD Phone: [phone]  [name] is a 82 y.o. female who presents today for same-day visit.  Patient notes on March 20 she had several skin lesions biopsied. She notes 2 of the 3 lesions have not healed well. 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 The lesions are on her right medial calf and right posterior shoulder. They're not draining. There is no erythema. There is no pain from them. She has not had any fevers. She feels well overall. She tried to contact her dermatologist though the dermatologist was out of the office thus she is here for evaluation.   ROS see history of present illness  Objective  Physical Exam Filed Vitals:   04/17/15 1454  BP: 106/68  Pulse: 71  Temp: 98.4 F (36.9 C)   Physical Exam  Constitutional: She is well-developed, well-nourished, and in no distress.  Pulmonary/Chest: Effort normal. No respiratory distress.  Neurological: She is alert.  Skin: Skin is warm and dry.  Right medial calf with dime-sized shallow ulceration with good granulation tissue that has no drainage, minimal erythema surrounding the area, no induration, no fluctuance, no tenderness, similar lesion located right posterior shoulder Sensation to light touch intact right lower extremity and posterior shoulder on the right side     Assessment/Plan: Please see individual problem list.  Wound of skin Patient with slowly healing wounds in the areas of biopsies on her right lower extremity and right posterior shoulder. There are no signs of infection. Discussed referral to wound center as these have been slow to heal. Discussed applying Vaseline every other day. Will keep covered when she is up and about though when at home and inactive can leave open to air for brief periods of time. Monitor for signs and symptoms of infection. Given return precautions.    Orders Placed This  Encounter  Procedures  . AMB referral to wound care center    Referral Priority:  Routine    Referral Type:  Consultation    Number of Visits Requested:  [place]em[place], MD [place]

## 2015-04-18 ENCOUNTER — Encounter: Payer: Medicare Other | Attending: Surgery | Admitting: Surgery

## 2015-04-18 DIAGNOSIS — F419 Anxiety disorder, unspecified: Secondary | ICD-10-CM | POA: Insufficient documentation

## 2015-04-18 DIAGNOSIS — I7300 Raynaud's syndrome without gangrene: Secondary | ICD-10-CM | POA: Insufficient documentation

## 2015-04-18 DIAGNOSIS — T8131XA Disruption of external operation (surgical) wound, not elsewhere classified, initial encounter: Secondary | ICD-10-CM | POA: Diagnosis not present

## 2015-04-18 DIAGNOSIS — M549 Dorsalgia, unspecified: Secondary | ICD-10-CM | POA: Diagnosis not present

## 2015-04-18 DIAGNOSIS — Y839 Surgical procedure, unspecified as the cause of abnormal reaction of the patient, or of later complication, without mention of misadventure at the time of the procedure: Secondary | ICD-10-CM | POA: Insufficient documentation

## 2015-04-18 DIAGNOSIS — M810 Age-related osteoporosis without current pathological fracture: Secondary | ICD-10-CM | POA: Diagnosis not present

## 2015-04-18 DIAGNOSIS — I959 Hypotension, unspecified: Secondary | ICD-10-CM | POA: Insufficient documentation

## 2015-04-18 DIAGNOSIS — F329 Major depressive disorder, single episode, unspecified: Secondary | ICD-10-CM | POA: Insufficient documentation

## 2015-04-18 DIAGNOSIS — M5136 Other intervertebral disc degeneration, lumbar region: Secondary | ICD-10-CM | POA: Insufficient documentation

## 2015-04-18 DIAGNOSIS — C44712 Basal cell carcinoma of skin of right lower limb, including hip: Secondary | ICD-10-CM | POA: Insufficient documentation

## 2015-04-18 DIAGNOSIS — G8929 Other chronic pain: Secondary | ICD-10-CM | POA: Insufficient documentation

## 2015-04-19 NOTE — Progress Notes (Signed)
PERIANN, BARREDO ([id number]) Visit Report for 04/18/2015 Allergy List Details Patient Name: [name], [name]. Date of Service: 04/18/2015 9:30 AM Medical Record Number: [id number] Patient Account Number: [id number] Date of Birth/Sex: 13-May-1926 (82 y.o. Female) Treating RN: Carolyne Fiscal, Debi Primary Care Physician: Deborra Medina Other Clinician: Referring Physician: Deborra Medina Treating Physician/Extender: Frann Rider in Treatment: 0 Electronic Signature(s) Signed: 04/18/2015 4:38:13 PM By: Alric Quan Entered By: Alric Quan on 04/18/2015 09:32:48 [name], [name] ([id number]) -------------------------------------------------------------------------------- Arrival Information Details Patient Name: [name]. Date of Service: 04/18/2015 9:30 AM Medical Record Number: [id number] Patient Account Number: [id number] Date of Birth/Sex: September 26, 1926 (82 y.o. Female) Treating RN: Carolyne Fiscal, Debi Primary Care Physician: Deborra Medina Other Clinician: Referring Physician: Deborra Medina Treating Physician/Extender: Frann Rider in Treatment: 0 Visit Information Patient Arrived: Ambulatory Arrival Time: 09:31 Accompanied By: self Transfer Assistance: None Patient Identification Verified: Yes Secondary Verification Process Yes Completed: Patient Requires Transmission- No Based Precautions: Patient Has Alerts: Yes Patient Alerts: ABI non- compressible Electronic Signature(s) Signed: 04/18/2015 4:38:13 PM By: Alric Quan Entered By: Alric Quan on 04/18/2015 09:53:50 [name], [name]. ([id number]) -------------------------------------------------------------------------------- Clinic Level of Care Assessment Details Patient Name: [name]. Date of Service: 04/18/2015 9:30 AM Medical Record Number: [id number] Patient Account Number: [id number] Date of Birth/Sex: 08/26/1926 (82 y.o. Female) Treating RN: Carolyne Fiscal, Debi Primary Care Physician:  Deborra Medina Other Clinician: Referring Physician: Deborra Medina Treating Physician/Extender: Frann Rider in Treatment: 0 Clinic Level of Care Assessment Items TOOL 2 Quantity Score X - Use when only an EandM is performed on the INITIAL visit 1 0 ASSESSMENTS - Nursing Assessment / Reassessment X - General Physical Exam (combine w/ comprehensive assessment (listed just 1 20 below) when performed on new pt. evals) X - Comprehensive Assessment (HX, ROS, Risk Assessments, Wounds Hx, etc.) 1 25 ASSESSMENTS - Wound and Skin Assessment / Reassessment []  - Simple Wound Assessment / Reassessment - one wound 0 X - Complex Wound Assessment / Reassessment - multiple wounds 2 5 []  - Dermatologic / Skin Assessment (not related to wound area) 0 ASSESSMENTS - Ostomy and/or Continence Assessment and Care []  - Incontinence Assessment and Management 0 []  - Ostomy Care Assessment and Management (repouching, etc.) 0 PROCESS - Coordination of Care []  - Simple Patient / Family Education for ongoing care 0 X - Complex (extensive) Patient / Family Education for ongoing care 1 20 []  - Staff obtains Programmer, systems, Records, Test Results / Process Orders 0 []  - Staff telephones HHA, Nursing Homes / Clarify orders / etc 0 []  - Routine Transfer to another Facility (non-emergent condition) 0 []  - Routine Hospital Admission (non-emergent condition) 0 X - New Admissions / Biomedical engineer / Ordering NPWT, Apligraf, etc. 1 15 []  - Emergency Hospital Admission (emergent condition) 0 X - Simple Discharge Coordination 1 10 [name], [name]. ([id number]) []  - Complex (extensive) Discharge Coordination 0 PROCESS - Special Needs []  - Pediatric / Minor Patient Management 0 []  - Isolation Patient Management 0 []  - Hearing / Language / Visual special needs 0 []  - Assessment of Community assistance (transportation, D/C planning, etc.) 0 []  - Additional assistance / Altered mentation 0 []  - Support Surface(s)  Assessment (bed, cushion, seat, etc.) 0 INTERVENTIONS - Wound Cleansing / Measurement X - Wound Imaging (photographs - any number of wounds) 1 5 []  - Wound Tracing (instead of photographs) 0 []  - Simple Wound Measurement - one wound 0 X - Complex Wound Measurement - multiple wounds 2 5 X - Simple Wound Cleansing -  one wound 1 5 []  - Complex Wound Cleansing - multiple wounds 0 INTERVENTIONS - Wound Dressings X - Small Wound Dressing one or multiple wounds 2 10 []  - Medium Wound Dressing one or multiple wounds 0 []  - Large Wound Dressing one or multiple wounds 0 []  - Application of Medications - injection 0 INTERVENTIONS - Miscellaneous []  - External ear exam 0 []  - Specimen Collection (cultures, biopsies, blood, body fluids, etc.) 0 []  - Specimen(s) / Culture(s) sent or taken to Lab for analysis 0 []  - Patient Transfer (multiple staff / Civil Service fast streamer / Similar devices) 0 []  - Simple Staple / Suture removal (25 or less) 0 []  - Complex Staple / Suture removal (26 or more) 0 [name], [name]. ([id number]) []  - Hypo / Hyperglycemic Management (close monitor of Blood Glucose) 0 X - Ankle / Brachial Index (ABI) - do not check if billed separately 1 15 Has the patient been seen at the hospital within the last three years: Yes Total Score: 155 Level Of Care: New/Established - Level 4 Electronic Signature(s) Signed: 04/18/2015 4:38:13 PM By: Alric Quan Entered By: Alric Quan on 04/18/2015 11:25:13 [name], [name] Kitchen ([id number]) -------------------------------------------------------------------------------- Encounter Discharge Information Details Patient Name: [name]. Date of Service: 04/18/2015 9:30 AM Medical Record Number: [id number] Patient Account Number: [id number] Date of Birth/Sex: 01/23/1926 (82 y.o. Female) Treating RN: Ahmed Prima Primary Care Physician: Deborra Medina Other Clinician: Referring Physician: Deborra Medina Treating Physician/Extender: Frann Rider in Treatment: 0 Encounter Discharge Information Items Discharge Pain Level: 0 Discharge Condition: Stable Ambulatory Status: Ambulatory Discharge Destination: Home Transportation: Private Auto Accompanied By: self Schedule Follow-up Appointment: Yes Medication Reconciliation completed and provided to Patient/Care No Taishawn Smaldone: Provided on Clinical Summary of Care: 04/18/2015 Form Type Recipient Paper Patient [facility] Electronic Signature(s) Signed: 04/18/2015 10:31:04 AM By: Ruthine Dose Entered By: Ruthine Dose on 04/18/2015 10:31:04 [name], [name] Kitchen ([id number]) -------------------------------------------------------------------------------- Lower Extremity Assessment Details Patient Name: Vandyke, Danijela White. Date of Service: 04/18/2015 9:30 AM Medical Record Number: [id number] Patient Account Number: [id number] Date of Birth/Sex: July 04, 1926 (82 y.o. Female) Treating RN: Carolyne Fiscal, Debi Primary Care Physician: Deborra Medina Other Clinician: Referring Physician: Deborra Medina Treating Physician/Extender: Frann Rider in Treatment: 0 Edema Assessment Assessed: Shirlyn Goltz: No] [Right: No] E[Left: dema] [Right: :] Calf Left: Right: Point of Measurement: 32 cm From Medial Instep 33.6 cm 32.7 cm Ankle Left: Right: Point of Measurement: 11 cm From Medial Instep 20 cm 19.7 cm Vascular Assessment Pulses: Posterior Tibial Palpable: [Left:No] [Right:No] Doppler: [Left:Multiphasic] [Right:Multiphasic] Dorsalis Pedis Palpable: [Left:Yes] [Right:Yes] Doppler: [Left:Multiphasic] [Right:Multiphasic] Extremity colors, hair growth, and conditions: Extremity Color: [Left:Hyperpigmented] [Right:Hyperpigmented] Hair Growth on Extremity: [Left:Yes] [Right:Yes] Temperature of Extremity: [Left:Warm] [Right:Warm] Capillary Refill: [Left:< 3 seconds] [Right:< 3 seconds] Toe Nail Assessment Left: Right: Thick: No No Discolored: Yes Yes Deformed: No No Improper Length and  Hygiene: No Electronic Signature(s) Signed: 04/18/2015 4:38:13 PM By: Raina Mina, [name] ([id number]) Entered By: Alric Quan on 04/18/2015 10:04:28 Brackins, [name] ([id number]) -------------------------------------------------------------------------------- Multi Wound Chart Details Patient Name: Cohenour, Coreen White. Date of Service: 04/18/2015 9:30 AM Medical Record Number: [id number] Patient Account Number: [id number] Date of Birth/Sex: Jan 29, 1926 (82 y.o. Female) Treating RN: Ahmed Prima Primary Care Physician: Deborra Medina Other Clinician: Referring Physician: Deborra Medina Treating Physician/Extender: Frann Rider in Treatment: 0 Vital Signs Height(in): 66 Pulse(bpm): 57 Weight(lbs): 133 Blood Pressure 185/63 (mmHg): Body Mass Index(BMI): 21 Temperature(F): 96.6 Respiratory Rate 20 (breaths/min): Photos: [1:No Photos] [2:No Photos] [N/A:N/A] Wound Location: [1:Right Lower Leg - Medial  Right Shoulder - Posterior N/A] Wounding Event: [1:Surgical Injury] [2:Surgical Injury] [N/A:N/A] Primary Etiology: [1:Open Surgical Wound] [2:Open Surgical Wound] [N/A:N/A] Comorbid History: [1:Cataracts, Hypotension, Cataracts, Hypotension, N/A Raynaudos, Osteoarthritis Raynaudos, Osteoarthritis] Date Acquired: [1:03/25/2015] [2:03/25/2015] [N/A:N/A] Weeks of Treatment: [1:0] [2:0] [N/A:N/A] Wound Status: [1:Open] [2:Open] [N/A:N/A] Measurements L x W x D 0.5x0.5x0.1 [2:0.4x0.7x0.1] [N/A:N/A] (cm) Area (cm) : [1:0.196] [2:0.22] [N/A:N/A] Volume (cm) : [1:0.02] [2:0.022] [N/A:N/A] Classification: [1:Partial Thickness] [2:Partial Thickness] [N/A:N/A] Exudate Amount: [1:Large] [2:Large] [N/A:N/A] Exudate Type: [1:Serous] [2:Serous] [N/A:N/A] Exudate Color: [1:amber] [2:amber] [N/A:N/A] Wound Margin: [1:Flat and Intact] [2:N/A] [N/A:N/A] Granulation Amount: [1:Medium (34-66%)] [2:Medium (34-66%)] [N/A:N/A] Granulation Quality: [1:Red] [2:Red, Pink]  [N/A:N/A] Necrotic Amount: [1:Medium (34-66%)] [2:Medium (34-66%)] [N/A:N/A] Exposed Structures: [1:Fascia: No Fat: No Tendon: No Muscle: No Joint: No Bone: No Limited to Skin Breakdown] [2:Fascia: No Fat: No Tendon: No Muscle: No Joint: No Bone: No Limited to Skin Breakdown] [N/A:N/A] Epithelialization: [1:None] [2:None] [N/A:N/A] Periwound Skin Texture: Edema: Yes No Abnormalities Noted N/A Periwound Skin Moist: Yes Moist: Yes N/A Moisture: Periwound Skin Color: Erythema: Yes Erythema: Yes N/A Erythema Location: Circumferential Circumferential N/A Temperature: No Abnormality N/A N/A Tenderness on Yes No N/A Palpation: Wound Preparation: Ulcer Cleansing: Ulcer Cleansing: N/A Rinsed/Irrigated with Rinsed/Irrigated with Saline Saline Topical Anesthetic Topical Anesthetic Applied: Other: lidocaine Applied: Other: lidocaine 4% 4% Treatment Notes Wound #1 (Right, Medial Lower Leg) 1. Cleansed with: Clean wound with Normal Saline 2. Anesthetic Topical Lidocaine 4% cream to wound bed prior to debridement 3. Peri-wound Care: Skin Prep 4. Dressing Applied: Prisma Ag Notes band-aide Wound #2 (Right, Posterior Shoulder) 1. Cleansed with: Clean wound with Normal Saline 2. Anesthetic Topical Lidocaine 4% cream to wound bed prior to debridement 3. Peri-wound Care: Skin Prep 4. Dressing Applied: Prisma Ag Notes band-aide Electronic Signature(s) Signed: 04/18/2015 4:38:13 PM By: Alric Quan Entered By: Alric Quan on 04/18/2015 11:23:40 [name], [name] ([id number]) [name], [name] ([id number]) -------------------------------------------------------------------------------- Multi-Disciplinary Care Plan Details Patient Name: [name], [name]. Date of Service: 04/18/2015 9:30 AM Medical Record Number: [id number] Patient Account Number: [id number] Date of Birth/Sex: 1926/02/06 (82 y.o. Female) Treating RN: Carolyne Fiscal, Debi Primary Care Physician: Deborra Medina Other  Clinician: Referring Physician: Deborra Medina Treating Physician/Extender: Frann Rider in Treatment: 0 Active Inactive Abuse / Safety / Falls / Self Care Management Nursing Diagnoses: Potential for falls Goals: Patient will remain injury free Date Initiated: 04/18/2015 Goal Status: Active Interventions: Assess fall risk on admission and as needed Notes: Nutrition Nursing Diagnoses: Imbalanced nutrition Goals: Patient/caregiver agrees to and verbalizes understanding of need to use nutritional supplements and/or vitamins as prescribed Date Initiated: 04/18/2015 Goal Status: Active Interventions: Assess patient nutrition upon admission and as needed per policy Notes: Orientation to the Wound Care Program Nursing Diagnoses: Knowledge deficit related to the wound healing center program Goals: Patient/caregiver will verbalize understanding of the [place], Mayci White. ([id number]) Date Initiated: 04/18/2015 Goal Status: Active Interventions: Provide education on orientation to the wound center Notes: Wound/Skin Impairment Nursing Diagnoses: Impaired tissue integrity Goals: Ulcer/skin breakdown will have a volume reduction of 30% by week 4 Date Initiated: 04/18/2015 Goal Status: Active Ulcer/skin breakdown will have a volume reduction of 50% by week 8 Date Initiated: 04/18/2015 Goal Status: Active Ulcer/skin breakdown will have a volume reduction of 80% by week 12 Date Initiated: 04/18/2015 Goal Status: Active Interventions: Assess patient/caregiver ability to obtain necessary supplies Assess ulceration(s) every visit Notes: Electronic Signature(s) Signed: 04/18/2015 4:38:13 PM By: Alric Quan Entered By: Alric Quan on 04/18/2015 11:24:07 Manzano, [place]. (  [id number]) -------------------------------------------------------------------------------- Pain Assessment Details Patient Name: Rentfrow, Melisha White. Date of Service:  04/18/2015 9:30 AM Medical Record Number: [id number] Patient Account Number: [id number] Date of Birth/Sex: 08/23/1926 (82 y.o. Female) Treating RN: Ahmed Prima Primary Care Physician: Deborra Medina Other Clinician: Referring Physician: Deborra Medina Treating Physician/Extender: Frann Rider in Treatment: 0 Active Problems Location of Pain Severity and Description of Pain Patient Has Paino No Site Locations Pain Management and Medication Current Pain Management: Electronic Signature(s) Signed: 04/18/2015 4:38:13 PM By: Alric Quan Entered By: Alric Quan on 04/18/2015 09:32:19 Bosko, [name] ([id number]) -------------------------------------------------------------------------------- Patient/Caregiver Education Details Patient Name: Maryjean Morn White. Date of Service: 04/18/2015 9:30 AM Medical Record Number: [id number] Patient Account Number: [id number] Date of Birth/Gender: [birth year] (82 y.o. Female) Treating RN: Ahmed Prima Primary Care Physician: Deborra Medina Other Clinician: Referring Physician: Deborra Medina Treating Physician/Extender: Frann Rider in Treatment: 0 Education Assessment Education Provided To: Patient Education Topics Provided Wound/Skin Impairment: Handouts: Other: change dressing as ordered Methods: Demonstration, Explain/Verbal Responses: State content correctly Electronic Signature(s) Signed: 04/18/2015 4:38:13 PM By: Alric Quan Entered By: Alric Quan on 04/18/2015 10:28:08 Kundinger, [name] ([id number]) -------------------------------------------------------------------------------- Wound Assessment Details Patient Name: Casteneda, Aris White. Date of Service: 04/18/2015 9:30 AM Medical Record Number: [id number] Patient Account Number: [id number] Date of Birth/Sex: September 05, 1926 (82 y.o. Female) Treating RN: Carolyne Fiscal, Debi Primary Care Physician: Deborra Medina Other Clinician: Referring Physician: Deborra Medina Treating Physician/Extender: Frann Rider in Treatment: 0 Wound Status Wound Number: 1 Primary Open Surgical Wound Etiology: Wound Location: Right Lower Leg - Medial Wound Status: Open Wounding Event: Surgical Injury Comorbid Cataracts, Hypotension, Raynaudos, Date Acquired: 03/25/2015 History: Osteoarthritis Weeks Of Treatment: 0 Clustered Wound: No Photos Photo Uploaded By: Alric Quan on 04/18/2015 16:34:43 Wound Measurements Length: (cm) 0.5 Width: (cm) 0.5 Depth: (cm) 0.1 Area: (cm) 0.196 Volume: (cm) 0.02 % Reduction in Area: % Reduction in Volume: Epithelialization: None Tunneling: No Undermining: No Wound Description Classification: Partial Thickness Wound Margin: Flat and Intact Exudate Amount: Large Exudate Type: Serous Exudate Color: amber Foul Odor After Cleansing: No Wound Bed Granulation Amount: Medium (34-66%) Exposed Structure Granulation Quality: Red Fascia Exposed: No Necrotic Amount: Medium (34-66%) Fat Layer Exposed: No Necrotic Quality: Adherent Slough Tendon Exposed: No Sheffer, Kaleyah White. ([id number]) Muscle Exposed: No Joint Exposed: No Bone Exposed: No Limited to Skin Breakdown Periwound Skin Texture Texture Color No Abnormalities Noted: No No Abnormalities Noted: No Localized Edema: Yes Erythema: Yes Erythema Location: Circumferential Moisture No Abnormalities Noted: No Temperature / Pain Moist: Yes Temperature: No Abnormality Tenderness on Palpation: Yes Wound Preparation Ulcer Cleansing: Rinsed/Irrigated with Saline Topical Anesthetic Applied: Other: lidocaine 4%, Treatment Notes Wound #1 (Right, Medial Lower Leg) 1. Cleansed with: Clean wound with Normal Saline 2. Anesthetic Topical Lidocaine 4% cream to wound bed prior to debridement 3. Peri-wound Care: Skin Prep 4. Dressing Applied: Prisma Ag Notes band-aide Electronic Signature(s) Signed: 04/18/2015 4:38:13 PM By: Alric Quan Entered  By: Alric Quan on 04/18/2015 09:57:03 Gatliff, Domitila White. ([id number]) -------------------------------------------------------------------------------- Wound Assessment Details Patient Name: Alsobrook, Gracie White. Date of Service: 04/18/2015 9:30 AM Medical Record Number: [id number] Patient Account Number: [id number] Date of Birth/Sex: [birth year] (82 y.o. Female) Treating RN: Ahmed Prima Primary Care Physician: Deborra Medina Other Clinician: Referring Physician: Deborra Medina Treating Physician/Extender: Frann Rider in Treatment: 0 Wound Status Wound Number: 2 Primary Open Surgical Wound Etiology: Wound Location: Right Shoulder - Posterior Wound Status: Open Wounding Event: Surgical Injury Comorbid Cataracts, Hypotension, Raynaudos, Date Acquired: 03/25/2015 History: Osteoarthritis Weeks Of Treatment: 0  Clustered Wound: No Photos Photo Uploaded By: Alric Quan on 04/18/2015 16:34:43 Wound Measurements Length: (cm) 0.4 Width: (cm) 0.7 Depth: (cm) 0.1 Area: (cm) 0.22 Volume: (cm) 0.022 % Reduction in Area: % Reduction in Volume: Epithelialization: None Tunneling: No Undermining: No Wound Description Classification: Partial Thickness Exudate Amount: Large Exudate Type: Serous Exudate Color: amber Foul Odor After Cleansing: No Wound Bed Granulation Amount: Medium (34-66%) Exposed Structure Granulation Quality: Red, Pink Fascia Exposed: No Necrotic Amount: Medium (34-66%) Fat Layer Exposed: No Necrotic Quality: Adherent Slough Tendon Exposed: No Muscle Exposed: No Heo, Mareta White. ([id number]) Joint Exposed: No Bone Exposed: No Limited to Skin Breakdown Periwound Skin Texture Texture Color No Abnormalities Noted: No No Abnormalities Noted: No Erythema: Yes Moisture Erythema Location: Circumferential No Abnormalities Noted: No Moist: Yes Wound Preparation Ulcer Cleansing: Rinsed/Irrigated with Saline Topical Anesthetic Applied: Other:  lidocaine 4%, Treatment Notes Wound #2 (Right, Posterior Shoulder) 1. Cleansed with: Clean wound with Normal Saline 2. Anesthetic Topical Lidocaine 4% cream to wound bed prior to debridement 3. Peri-wound Care: Skin Prep 4. Dressing Applied: Prisma Ag Notes band-aide Electronic Signature(s) Signed: 04/18/2015 4:38:13 PM By: Alric Quan Entered By: Alric Quan on 04/18/2015 10:01:13 Pluta, [name] ([id number]) -------------------------------------------------------------------------------- Vitals Details Patient Name: Maryjean Morn White. Date of Service: 04/18/2015 9:30 AM Medical Record Number: [id number] Patient Account Number: [id number] Date of Birth/Sex: [birth year] (82 y.o. Female) Treating RN: Carolyne Fiscal, Debi Primary Care Physician: Deborra Medina Other Clinician: Referring Physician: Deborra Medina Treating Physician/Extender: Frann Rider in Treatment: 0 Vital Signs Time Taken: 09:32 Temperature (F): 96.6 Height (in): 66 Pulse (bpm): 57 Source: Stated Respiratory Rate (breaths/min): 20 Weight (lbs): 133 Blood Pressure (mmHg): 185/63 Source: Stated Reference Range: 80 - 120 mg / dl Body Mass Index (BMI): 21.5 Electronic Signature(s) Signed: 04/18/2015 4:38:13 PM By: Alric Quan Entered By: Alric Quan on 04/18/2015 09:43:18

## 2015-04-19 NOTE — Progress Notes (Signed)
MALIA, BEM ([id number]) Visit Report for 04/18/2015 Chief Complaint Document Details Patient Name: [name], [name] Date of Service: 04/18/2015 9:30 AM Medical Record Number: [id number] Patient Account Number: [id number] Date of Birth/Sex: Oct 17, 1926 (82 y.o. Female) Treating RN: Carolyne Fiscal, Debi Primary Care Physician: Deborra Medina Other Clinician: Referring Physician: Deborra Medina Treating Physician/Extender: Frann Rider in Treatment: 0 Information Obtained from: Patient Chief Complaint Patient presents to the wound care center with open non-healing surgical wound(s) which she's had for 3 weeks on her right lower extremity and right upper back Electronic Signature(s) Signed: 04/18/2015 10:21:51 AM By: Christin Fudge MD, FACS Entered By: Christin Fudge on 04/18/2015 10:21:51 Ewen, Glennys B. ([id number]) -------------------------------------------------------------------------------- HPI Details Patient Name: Gwendolyn Morn B. Date of Service: 04/18/2015 9:30 AM Medical Record Number: [id number] Patient Account Number: [id number] Date of Birth/Sex: [birth year] (82 y.o. Female) Treating RN: Ahmed Prima Primary Care Physician: Deborra Medina Other Clinician: Referring Physician: Deborra Medina Treating Physician/Extender: Frann Rider in Treatment: 0 History of Present Illness Location: right lower extremity and right upper back Quality: Patient reports No Pain. Severity: Patient states wound (s) are getting better. Duration: Patient has had the wound for < 3 weeks prior to presenting for treatment Context: The wound occurred when the patient had skin lesions excised which were done by her dermatologist Modifying Factors: Other treatment(s) tried include:she has been applying Vaseline to the wounds HPI Description: 82 year old patient who was seen by her primary Dr. [name] for a nonhealing wound of the skin where she had biopsies done on March 20. The  lesions were excised and came back as basal cell carcinomas. There was one on her right medial calf and the other on the right posterior shoulder. after reviewing the wounds he referred her to [place] for further care. her past medical history is significant for degenerative disc disease, osteoporosis, chronic back pain, and Raynaud's phenomenon. she has never been a smoker. I reviewed the dermatology notes from her visit of March 22 patient was seen by Dr. 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 She also had a squamous cell carcinoma of the right lower leg. wounds were reviewed and found to be healing well and she was asked to come back in 6 months. Electronic Signature(s) Signed: 04/18/2015 10:23:13 AM By: Christin Fudge MD, FACS Previous Signature: 04/18/2015 9:45:42 AM Version By: Christin Fudge MD, FACS Entered By: Christin Fudge on 04/18/2015 10:23:12 Zbikowski, Gweneth Fritter ([id number]) -------------------------------------------------------------------------------- Physical Exam Details Patient Name: Norgard, Lujain B. Date of Service: 04/18/2015 9:30 AM Medical Record Number: [id number] Patient Account Number: [id number] Date of Birth/Sex: [birth year] (82 y.o. Female) Treating RN: Ahmed Prima Primary Care Physician: Deborra Medina Other Clinician: Referring Physician: Deborra Medina Treating Physician/Extender: Frann Rider in Treatment: 0 Constitutional . Pulse regular. Respirations normal and unlabored. Afebrile. . Eyes Nonicteric. Reactive to light. Ears, Nose, Mouth, and Throat Lips, teeth, and gums WNL.[name] Moist mucosa without lesions. Neck supple and nontender. No palpable supraclavicular or cervical adenopathy. Normal sized without goiter. Respiratory WNL. No retractions.. Cardiovascular Heart rhythm and rate regular, no murmur or gallop.. Pedal Pulses WNL. ABIs were  noncompressible. Chest Breasts symmetical and no nipple discharge.. Breast tissue WNL, no masses, lumps, or tenderness.. Gastrointestinal (GI) Abdomen without masses or tenderness.. No liver or spleen enlargement or tenderness.. Lymphatic No adneopathy. No adenopathy. No adenopathy. Musculoskeletal Adexa without tenderness or enlargement.. Digits and nails  w/o clubbing, cyanosis, infection, petechiae, ischemia, or inflammatory conditions.. Integumentary (Hair, Skin) No suspicious lesions. No crepitus or fluctuance. No peri-wound warmth or erythema. No masses.[name] Psychiatric Judgement and insight Intact.. No evidence of depression, anxiety, or agitation.. Notes the patient has 2 superficial ulcerated areas where skin biopsy and cauterization was done one on the right lower extremity medially and one on her right upper shoulder posteriorly. Both have minimal debris and healthy granulation tissue and there is no signs of cellulitis or infection. Electronic Signature(s) Signed: 04/18/2015 10:24:26 AM By: Christin Fudge MD, FACS Entered By: Christin Fudge on 04/18/2015 10:24:25 [name] ([id number]) Lacap, Gweneth Fritter ([id number]) -------------------------------------------------------------------------------- Physician Orders Details Patient Name: White, [name] Date of Service: 04/18/2015 9:30 AM Medical Record Number: [id number] Patient Account Number: [id number] Date of Birth/Sex: [birth year] (82 y.o. Female) Treating RN: Ahmed Prima Primary Care Physician: Deborra Medina Other Clinician: Referring Physician: Deborra Medina Treating Physician/Extender: Frann Rider in Treatment: 0 Verbal / Phone Orders: Yes Clinician: Pinkerton, Debi Read Back and Verified: Yes Diagnosis Coding Wound Cleansing Wound #1 Right,Medial Lower Leg o Clean wound with Normal Saline. Wound #2 Right,Posterior Shoulder o Clean wound with Normal Saline. Anesthetic Wound #1 Right,Medial  Lower Leg o Topical Lidocaine 4% cream applied to wound bed prior to debridement Wound #2 Right,Posterior Shoulder o Topical Lidocaine 4% cream applied to wound bed prior to debridement Skin Barriers/Peri-Wound Care Wound #1 Right,Medial Lower Leg o Skin Prep Wound #2 Right,Posterior Shoulder o Skin Prep Primary Wound Dressing Wound #1 Right,Medial Lower Leg o Prisma Ag - moisten with saline or ky jelly and cover with band-aide Wound #2 Right,Posterior Shoulder o Prisma Ag - moisten with saline or ky jelly and cover with band-aide Dressing Change Frequency Wound #1 Right,Medial Lower Leg o Change dressing every other day. Wound #2 Right,Posterior Shoulder o Change dressing every other day. CHAUNTEE, ROSENCRANS B. ([id number]) Follow-up Appointments Wound #1 Right,Medial Lower Leg o Return Appointment in 1 week. Wound #2 Right,Posterior Shoulder o Return Appointment in 1 week. Electronic Signature(s) Signed: 04/18/2015 4:13:42 PM By: Christin Fudge MD, FACS Signed: 04/18/2015 4:38:13 PM By: Alric Quan Entered By: Alric Quan on 04/18/2015 10:27:01 Molitor, Gweneth Fritter ([id number]) -------------------------------------------------------------------------------- Problem List Details Patient Name: Quarry, Lashunda B. Date of Service: 04/18/2015 9:30 AM Medical Record Number: [id number] Patient Account Number: [id number] Date of Birth/Sex: January 02, 1927 (82 y.o. Female) Treating RN: Carolyne Fiscal, Debi Primary Care Physician: Deborra Medina Other Clinician: Referring Physician: Deborra Medina Treating Physician/Extender: Frann Rider in Treatment: 0 Active Problems ICD-10 Encounter Code Description Active Date Diagnosis T81.31XA Disruption of external operation (surgical) wound, not 04/18/2015 Yes elsewhere classified, initial encounter C44.712 Basal cell carcinoma of skin of right lower limb, including 04/18/2015 Yes hip Inactive Problems Resolved  Problems Electronic Signature(s) Signed: 04/18/2015 10:21:22 AM By: Christin Fudge MD, FACS Entered By: Christin Fudge on 04/18/2015 10:21:21 Straley, Gweneth Fritter ([id number]) -------------------------------------------------------------------------------- Progress Note Details Patient Name: Kopischke, Marni B. Date of Service: 04/18/2015 9:30 AM Medical Record Number: [id number] Patient Account Number: [id number] Date of Birth/Sex: [birth year] (82 y.o. Female) Treating RN: Ahmed Prima Primary Care Physician: Deborra Medina Other Clinician: Referring Physician: Deborra Medina Treating Physician/Extender: Frann Rider in Treatment: 0 Subjective Chief Complaint Information obtained from Patient Patient presents to the wound care center with open non-healing surgical wound(s) which she's had for 3 weeks on her right lower extremity and right upper back History of Present Illness (HPI) The following HPI elements were documented for the patient's wound: Location: right lower extremity and right  upper back Quality: Patient reports No Pain. Severity: Patient states wound (s) are getting better. Duration: Patient has had the wound for < 3 weeks prior to presenting for treatment Context: The wound occurred when the patient had skin lesions excised which were done by her dermatologist Modifying Factors: Other treatment(s) tried include:she has been applying Vaseline to the wounds 82 year old patient who was seen by her primary Dr. [name] for a nonhealing wound of the skin where she had biopsies done on March 20. The lesions were excised and came back as basal cell carcinomas. There was one on her right medial calf and the other on the right posterior shoulder. after reviewing the wounds he referred her to [place] for further care. her past medical history is significant for degenerative disc disease, osteoporosis, chronic back pain, and Raynaud's phenomenon. she has never been a smoker. I  reviewed the dermatology notes from her visit of March 22 patient was seen by Dr. 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 She also had a squamous cell carcinoma of the right lower leg. wounds were reviewed and found to be healing well and she was asked to come back in 6 months. Wound History Patient presents with 2 open wounds that have been present for approximately 3 weeks. Patient has been treating wounds in the following manner: vaseline. Laboratory tests have not been performed in the last month. Patient reportedly has not tested positive for an antibiotic resistant organism. Patient reportedly has not tested positive for osteomyelitis. Patient reportedly has not had testing performed to evaluate circulation in the legs. Patient experiences the following problems associated with their wounds: swelling. Patient History Information obtained from Patient, . LUCYNA, BALLEK ([id number]) Allergies No allergies have been documented for the patient Family History Cancer - Mother, Siblings, Heart Disease - Father, Stroke - Siblings, No family history of Diabetes, Hereditary Spherocytosis, Hypertension, Kidney Disease, Lung Disease, Seizures, Thyroid Problems, Tuberculosis. Social History Never smoker, Marital Status - Widowed, Alcohol Use - Never, Drug Use - No History, Caffeine Use - Daily. Medical History Eyes Patient has history of Cataracts - surgical removal Cardiovascular Patient has history of Hypotension - orthostatic Immunological Patient has history of Raynaud s Musculoskeletal Patient has history of Osteoarthritis Review of Systems (ROS) Eyes Complains or has symptoms of Glasses / Contacts - glasses. Ear/Nose/Mouth/Throat The patient has no complaints or symptoms. Hematologic/Lymphatic The patient has no complaints or symptoms. Respiratory The patient has no  complaints or symptoms. Cardiovascular hyperlipidemia Gastrointestinal constipation Endocrine The patient has no complaints or symptoms, hyponatremia Genitourinary Complains or has symptoms of Incontinence/dribbling, urinary frequency Integumentary (Skin) Complains or has symptoms of Wounds. Musculoskeletal chronic left hip pain nocturnal leg cramps degeneration of lumbar / intervertebral disc back pain Neurologic insomnia lethargy Oncologic basal cell carcinoma Psychiatric Complains or has symptoms of Anxiety, depression melancholia (cant find pleasure in doing things) Medications: Goll, Kairy B. ([id number]) I have reviewed her list of medications which includes a baby aspirin, diazepam, diclofenac sodium, Colace, Flonase, Imitrex, tramadol. Objective Constitutional Pulse regular. Respirations normal and unlabored. Afebrile. Vitals Time Taken: 9:32 AM, Height: 66 in, Source: Stated, Weight: 133 lbs, Source: Stated, BMI: 21.5, Temperature: 96.6 F, Pulse: 57 bpm, Respiratory Rate: 20 breaths/min, Blood Pressure: 185/63 mmHg. Eyes Nonicteric. Reactive to light. Ears, Nose, Mouth, and Throat Lips, teeth, and gums WNL.[name] Moist mucosa without lesions. Neck supple and nontender.  No palpable supraclavicular or cervical adenopathy. Normal sized without goiter. Respiratory WNL. No retractions.. Cardiovascular Heart rhythm and rate regular, no murmur or gallop.. Pedal Pulses WNL. ABIs were noncompressible. Chest Breasts symmetical and no nipple discharge.. Breast tissue WNL, no masses, lumps, or tenderness.. Gastrointestinal (GI) Abdomen without masses or tenderness.. No liver or spleen enlargement or tenderness.. Lymphatic No adneopathy. No adenopathy. No adenopathy. Musculoskeletal Adexa without tenderness or enlargement.. Digits and nails w/o clubbing, cyanosis, infection, petechiae, ischemia, or inflammatory conditions.[name] Psychiatric Judgement and insight Intact.. No  evidence of depression, anxiety, or agitation.. General Notes: the patient has 2 superficial ulcerated areas where skin biopsy and cauterization was done one on the right lower extremity medially and one on her right upper shoulder posteriorly. Both have Capobianco, Shadae B. ([id number]) minimal debris and healthy granulation tissue and there is no signs of cellulitis or infection. Integumentary (Hair, Skin) No suspicious lesions. No crepitus or fluctuance. No peri-wound warmth or erythema. No masses.. Wound #1 status is Open. Original cause of wound was Surgical Injury. The wound is located on the Right,Medial Lower Leg. The wound measures 0.5cm length x 0.5cm width x 0.1cm depth; 0.196cm^2 area and 0.02cm^3 volume. The wound is limited to skin breakdown. There is no tunneling or undermining noted. There is a large amount of serous drainage noted. The wound margin is flat and intact. There is medium (34-66%) red granulation within the wound bed. There is a medium (34-66%) amount of necrotic tissue within the wound bed including Adherent Slough. The periwound skin appearance exhibited: Localized Edema, Moist, Erythema. The surrounding wound skin color is noted with erythema which is circumferential. Periwound temperature was noted as No Abnormality. The periwound has tenderness on palpation. Wound #2 status is Open. Original cause of wound was Surgical Injury. The wound is located on the Right,Posterior Shoulder. The wound measures 0.4cm length x 0.7cm width x 0.1cm depth; 0.22cm^2 area and 0.022cm^3 volume. The wound is limited to skin breakdown. There is no tunneling or undermining noted. There is a large amount of serous drainage noted. There is medium (34-66%) red, pink granulation within the wound bed. There is a medium (34-66%) amount of necrotic tissue within the wound bed including Adherent Slough. The periwound skin appearance exhibited: Moist, Erythema. The surrounding wound skin color is  noted with erythema which is circumferential. Assessment Active Problems ICD-10 T81.31XA - Disruption of external operation (surgical) wound, not elsewhere classified, initial encounter C44.712 - Basal cell carcinoma of skin of right lower limb, including hip 82 year old patient with slowly to heal wounds from recent skin biopsies was concerned whether there was infection and I have reassured her that everything is good. I have recommended Prisma AG to be placed over the wound and covered with a bordered foam and to be changed every other day. we have also discussed adequate nutrition and vitamin supplements and she will be compliant. Biopsy results were reviewed and this does not look like any recurrence or residual carcinoma. She will come back and see me next week. Plan SHYTERIA, UDE ([id number]) 82 year old patient with slowly to heal wounds from recent skin biopsies was concerned whether there was infection and I have reassured her that everything is good. I have recommended Prisma AG to be placed over the wound and covered with a bordered foam and to be changed every other day. we have also discussed adequate nutrition and vitamin supplements and she will be compliant. Biopsy results were reviewed and this does not look like any recurrence or residual  carcinoma. She will come back and see me next week. Electronic Signature(s) Signed: 04/18/2015 10:27:03 AM By: Christin Fudge MD, FACS Entered By: Christin Fudge on 04/18/2015 10:27:03 Roberg, Gweneth Fritter ([id number]) -------------------------------------------------------------------------------- ROS/PFSH Details Patient Name: Gwendolyn Morn B. Date of Service: 04/18/2015 9:30 AM Medical Record Number: [id number] Patient Account Number: [id number] Date of Birth/Sex: 24-Nov-1926 (82 y.o. Female) Treating RN: Carolyne Fiscal, Debi Primary Care Physician: Deborra Medina Other Clinician: Referring Physician: Deborra Medina Treating  Physician/Extender: Frann Rider in Treatment: 0 Information Obtained From Patient Other: Wound History Do you currently have one or more open woundso Yes How many open wounds do you currently haveo 2 Approximately how long have you had your woundso 3 weeks How have you been treating your wound(s) until nowo vaseline Has your wound(s) ever healed and then re-openedo No Have you had any lab work done in the past montho No Have you tested positive for an antibiotic resistant organism (MRSA, VRE)o No Have you tested positive for osteomyelitis (bone infection)o No Have you had any tests for circulation on your legso No Have you had other problems associated with your woundso Swelling Eyes Complaints and Symptoms: Positive for: Glasses / Contacts - glasses Medical History: Positive for: Cataracts - surgical removal Genitourinary Complaints and Symptoms: Positive for: Incontinence/dribbling Review of System Notes: urinary frequency Integumentary (Skin) Complaints and Symptoms: Positive for: Wounds Psychiatric Complaints and Symptoms: Positive for: Anxiety Review of System Notes: depression melancholia (cant find pleasure in doing things) Perret, Loanne B. ([id number]) Ear/Nose/Mouth/Throat Complaints and Symptoms: No Complaints or Symptoms Hematologic/Lymphatic Complaints and Symptoms: No Complaints or Symptoms Respiratory Complaints and Symptoms: No Complaints or Symptoms Cardiovascular Complaints and Symptoms: Review of System Notes: hyperlipidemia Medical History: Positive for: Hypotension - orthostatic Gastrointestinal Complaints and Symptoms: Review of System Notes: constipation Endocrine Complaints and Symptoms: No Complaints or Symptoms Complaints and Symptoms: Review of System Notes: hyponatremia Immunological Medical History: Positive for: Raynaudos Musculoskeletal Complaints and Symptoms: Review of System Notes: chronic left hip  pain nocturnal leg cramps degeneration of lumbar / intervertebral disc back pain Bohnsack, Breon B. ([id number]) Medical History: Positive for: Osteoarthritis Neurologic Complaints and Symptoms: Review of System Notes: insomnia lethargy Oncologic Complaints and Symptoms: Review of System Notes: basal cell carcinoma HBO Extended History Items Eyes: Cataracts Family and Social History Cancer: Yes - Mother, Siblings; Diabetes: No; Heart Disease: Yes - Father; Hereditary Spherocytosis: No; Hypertension: No; Kidney Disease: No; Lung Disease: No; Seizures: No; Stroke: Yes - Siblings; Thyroid Problems: No; Tuberculosis: No; Never smoker; Marital Status - Widowed; Alcohol Use: Never; Drug Use: No History; Caffeine Use: Daily; Financial Concerns: No; Food, Clothing or Shelter Needs: No; Support System Lacking: No; Transportation Concerns: No; Advanced Directives: Yes (Not Provided); Patient does not want information on Advanced Directives; Do not resuscitate: No; Living Will: Yes (Not Provided); Medical Power of Attorney: Yes - Luetta Nutting (Not Provided) Electronic Signature(s) Signed: 04/18/2015 4:13:42 PM By: Christin Fudge MD, FACS Signed: 04/18/2015 4:38:13 PM By: Alric Quan Previous Signature: 04/18/2015 9:33:09 AM Version By: Christin Fudge MD, FACS Entered By: Alric Quan on 04/18/2015 09:37:57 Ruff, Yanelle BMarland Kitchen ([id number]) -------------------------------------------------------------------------------- SuperBill Details Patient Name: Reindel, Piya B. Date of Service: 04/18/2015 Medical Record Number: [id number] Patient Account Number: [id number] Date of Birth/Sex: 04-06-1926 (82 y.o. Female) Treating RN: Ahmed Prima Primary Care Physician: Deborra Medina Other Clinician: Referring Physician: Deborra Medina Treating Physician/Extender: Frann Rider in Treatment: 0 Diagnosis Coding ICD-10 Codes Code Description Disruption of external operation (surgical)  wound, not elsewhere classified, initial T81.31XA encounter C44.712  Basal cell carcinoma of skin of right lower limb, including hip Facility Procedures CPT4 Code: [id number] Description: 99214 - WOUND CARE VISIT-LEV 4 EST PT Modifier: Quantity: 1 Physician Procedures CPT4: Description Modifier Quantity Code [id number] WC PHYS LEVEL 3 o NEW PT 1 ICD-10 Description Diagnosis T81.31XA Disruption of external operation (surgical) wound, not elsewhere classified, initial encounter [id number] Basal cell carcinoma of skin of right  lower limb, including hip Electronic Signature(s) Signed: 04/18/2015 4:13:42 PM By: Christin Fudge MD, FACS Signed: 04/18/2015 4:38:13 PM By: Alric Quan Previous Signature: 04/18/2015 10:27:23 AM Version By: Christin Fudge MD, FACS Entered By: Alric Quan on 04/18/2015 11:25:40

## 2015-04-19 NOTE — Progress Notes (Signed)
[name] ([id number]) Visit Report for 04/18/2015 Abuse/Suicide Risk Screen Details Patient Name: [name], [name]. Date of Service: 04/18/2015 9:30 AM Medical Record Number: [id number] Patient Account Number: [id number] Date of Birth/Sex: 1926/08/18 (82 y.o. Female) Treating RN: Ahmed Prima Primary Care Physician: Deborra Medina Other Clinician: Referring Physician: Deborra Medina Treating Physician/Extender: Frann Rider in Treatment: 0 Abuse/Suicide Risk Screen Items Answer ABUSE/SUICIDE RISK SCREEN: Has anyone close to you tried to hurt or harm you recentlyo No Do you feel uncomfortable with anyone in your familyo No Has anyone forced you do things that you didnot want to doo No Do you have any thoughts of harming yourselfo No Patient displays signs or symptoms of abuse and/or neglect. No Electronic Signature(s) Signed: 04/18/2015 4:38:13 PM By: Alric Quan Entered By: Alric Quan on 04/18/2015 09:38:04 [name], Gwendolyn BMarland Kitchen ([id number]) -------------------------------------------------------------------------------- Activities of Daily Living Details Patient Name: [name], [name]. Date of Service: 04/18/2015 9:30 AM Medical Record Number: [id number] Patient Account Number: [id number] Date of Birth/Sex: May 19, 1926 (82 y.o. Female) Treating RN: Ahmed Prima Primary Care Physician: Deborra Medina Other Clinician: Referring Physician: Deborra Medina Treating Physician/Extender: Frann Rider in Treatment: 0 Activities of Daily Living Items Answer Activities of Daily Living (Please select one for each item) Drive Automobile Completely Able Take Medications Completely Able Use Telephone Completely Able Care for Appearance Completely Able Use Toilet Completely Able Bath / Shower Completely Able Dress Self Completely Able Feed Self Completely Able Walk Completely Able Get In / Out Bed Completely Able Housework Completely Able Prepare Meals  Completely Able Handle Money Completely Able Shop for Self Completely Able Electronic Signature(s) Signed: 04/18/2015 4:38:13 PM By: Alric Quan Entered By: Alric Quan on 04/18/2015 09:38:26 [name], [name] ([id number]) -------------------------------------------------------------------------------- Education Assessment Details Patient Name: [name]. Date of Service: 04/18/2015 9:30 AM Medical Record Number: [id number] Patient Account Number: [id number] Date of Birth/Sex: [birth year] (82 y.o. Female) Treating RN: Carolyne Fiscal, Debi Primary Care Physician: Deborra Medina Other Clinician: Referring Physician: Deborra Medina Treating Physician/Extender: Frann Rider in Treatment: 0 Primary Learner Assessed: Patient Learning Preferences/Education Level/Primary Language Learning Preference: Explanation, Printed Material Highest Education Level: High School Preferred Language: English Cognitive Barrier Assessment/Beliefs Language Barrier: No Translator Needed: No Memory Deficit: No Emotional Barrier: No Cultural/Religious Beliefs Affecting Medical No Care: Physical Barrier Assessment Impaired Vision: Yes Glasses Impaired Hearing: No Decreased Hand dexterity: No Knowledge/Comprehension Assessment Knowledge Level: High Comprehension Level: High Ability to understand written High instructions: Ability to understand verbal High instructions: Motivation Assessment Anxiety Level: Calm Cooperation: Cooperative Education Importance: Acknowledges Need Interest in Health Problems: Asks Questions Perception: Coherent Willingness to Engage in Self- High Management Activities: Readiness to Engage in Self- High Management Activities: Electronic Signature(s) [name], [name] ([id number]) Signed: 04/18/2015 4:38:13 PM By: Alric Quan Entered By: Alric Quan on 04/18/2015 09:38:53 [name], [name]  ([id number]) -------------------------------------------------------------------------------- Fall Risk Assessment Details Patient Name: [name], [name]. Date of Service: 04/18/2015 9:30 AM Medical Record Number: [id number] Patient Account Number: [id number] Date of Birth/Sex: October 06, 1926 (82 y.o. Female) Treating RN: Carolyne Fiscal, Debi Primary Care Physician: Deborra Medina Other Clinician: Referring Physician: Deborra Medina Treating Physician/Extender: Frann Rider in Treatment: 0 Fall Risk Assessment Items Have you had 2 or more falls in the last 12 monthso 0 No Have you had any fall that resulted in injury in the last 12 monthso 0 No FALL RISK ASSESSMENT: History of falling - immediate or within 3 months 0 No Secondary diagnosis 15 Yes Ambulatory aid None/bed rest/wheelchair/nurse 0 No Crutches/cane/walker 0 No  Furniture 0 No IV Access/Saline Lock 0 No Gait/Training Normal/bed rest/immobile 0 No Weak 0 No Impaired 0 No Mental Status Oriented to own ability 0 Yes Electronic Signature(s) Signed: 04/18/2015 4:38:13 PM By: Alric Quan Entered By: Alric Quan on 04/18/2015 09:39:07 [name], [name]. ([id number]) -------------------------------------------------------------------------------- Nutrition Risk Assessment Details Patient Name: [name], [name]. Date of Service: 04/18/2015 9:30 AM Medical Record Number: [id number] Patient Account Number: [id number] Date of Birth/Sex: 1926/08/05 (82 y.o. Female) Treating RN: Carolyne Fiscal, Debi Primary Care Physician: Deborra Medina Other Clinician: Referring Physician: Deborra Medina Treating Physician/Extender: Frann Rider in Treatment: 0 Height (in): 66 Weight (lbs): 133 Body Mass Index (BMI): 21.5 Nutrition Risk Assessment Items NUTRITION RISK SCREEN: I have an illness or condition that made me change the kind and/or 2 Yes amount of food I eat I eat fewer than two meals per day 3 Yes I eat few fruits and  vegetables, or milk products 0 No I have three or more drinks of beer, liquor or wine almost every day 0 No I have tooth or mouth problems that make it hard for me to eat 0 No I don't always have enough money to buy the food I need 0 No I eat alone most of the time 1 Yes I take three or more different prescribed or over-the-counter drugs a 1 Yes day Without wanting to, I have lost or gained 10 pounds in the last six 0 No months I am not always physically able to shop, cook and/or feed myself 0 No Nutrition Protocols Good Risk Protocol Moderate Risk Protocol Electronic Signature(s) Signed: 04/18/2015 4:38:13 PM By: Alric Quan Entered By: Alric Quan on 04/18/2015 09:40:22

## 2015-04-22 NOTE — Telephone Encounter (Signed)
I have rec'd [name]'s insurance verification for [employer].  She will have an estimated responsibility of [phone].  Please make pt aware this is an estimate and we will not know an exact amt until insurance(s) has/have paid.  I have sent a copy of the summary of benefits to be scanned into pt's chart.    If pt cannot afford $215 for her injection, please advise her to contact [employer] at [phone] and select option #1 to see if she qualifies for one of their assistance programs.  If she qualifies they will instruct her how to proceed.  I  Once pt recs injection, please let me know actual injection date so I can update the [employer] portal.  If you have any questions, please let me know.  Thank you!

## 2015-04-23 NOTE — Telephone Encounter (Signed)
No answer patient does not have voicemail will continue to call.

## 2015-04-25 ENCOUNTER — Encounter: Payer: Medicare Other | Admitting: Surgery

## 2015-04-25 DIAGNOSIS — I7300 Raynaud's syndrome without gangrene: Secondary | ICD-10-CM | POA: Diagnosis not present

## 2015-04-25 DIAGNOSIS — M549 Dorsalgia, unspecified: Secondary | ICD-10-CM | POA: Diagnosis not present

## 2015-04-25 DIAGNOSIS — M5136 Other intervertebral disc degeneration, lumbar region: Secondary | ICD-10-CM | POA: Diagnosis not present

## 2015-04-25 DIAGNOSIS — I959 Hypotension, unspecified: Secondary | ICD-10-CM | POA: Diagnosis not present

## 2015-04-25 DIAGNOSIS — M810 Age-related osteoporosis without current pathological fracture: Secondary | ICD-10-CM | POA: Diagnosis not present

## 2015-04-25 DIAGNOSIS — G8929 Other chronic pain: Secondary | ICD-10-CM | POA: Diagnosis not present

## 2015-04-25 DIAGNOSIS — T8131XD Disruption of external operation (surgical) wound, not elsewhere classified, subsequent encounter: Secondary | ICD-10-CM | POA: Diagnosis not present

## 2015-04-25 DIAGNOSIS — T8131XA Disruption of external operation (surgical) wound, not elsewhere classified, initial encounter: Secondary | ICD-10-CM | POA: Diagnosis not present

## 2015-04-25 DIAGNOSIS — C44712 Basal cell carcinoma of skin of right lower limb, including hip: Secondary | ICD-10-CM | POA: Diagnosis not present

## 2015-04-27 NOTE — Progress Notes (Signed)
TYONIA, [name] ([id number]) Visit Report for 04/25/2015 Arrival Information Details Patient Name: [name], [name] Date of Service: 04/25/2015 12:45 PM Medical Record Number: [id number] Patient Account Number: [id number] Date of Birth/Sex: 04-Sep-1926 (82 y.o. Female) Treating RN: Ahmed Prima Primary Care Physician: Deborra Medina Other Clinician: Referring Physician: Deborra Medina Treating Physician/Extender: Frann Rider in Treatment: 1 Visit Information History Since Last Visit All ordered tests and consults were completed: No Patient Arrived: Ambulatory Added or deleted any medications: No Arrival Time: 12:52 Any new allergies or adverse reactions: No Accompanied By: self Had a fall or experienced change in No Transfer Assistance: None activities of daily living that may affect Patient Identification Verified: Yes risk of falls: Secondary Verification Process Yes Signs or symptoms of abuse/neglect since last No Completed: visito Patient Requires Transmission- No Hospitalized since last visit: No Based Precautions: Pain Present Now: No Patient Has Alerts: Yes Patient Alerts: ABI non- compressible Electronic Signature(s) Signed: 04/26/2015 5:24:19 PM By: Alric Quan Entered By: Alric Quan on 04/25/2015 12:52:18 [name], [name] ([id number]) -------------------------------------------------------------------------------- Clinic Level of Care Assessment Details Patient Name: [name], [name] Date of Service: 04/25/2015 12:45 PM Medical Record Number: [id number] Patient Account Number: [id number] Date of Birth/Sex: January 25, 1926 (82 y.o. Female) Treating RN: Carolyne Fiscal, Debi Primary Care Physician: Deborra Medina Other Clinician: Referring Physician: Deborra Medina Treating Physician/Extender: Frann Rider in Treatment: 1 Clinic Level of Care Assessment Items TOOL 4 Quantity Score X - Use when only an EandM is performed on FOLLOW-UP visit 1  0 ASSESSMENTS - Nursing Assessment / Reassessment X - Reassessment of Co-morbidities (includes updates in patient status) 1 10 X - Reassessment of Adherence to Treatment Plan 1 5 ASSESSMENTS - Wound and Skin Assessment / Reassessment []  - Simple Wound Assessment / Reassessment - one wound 0 X - Complex Wound Assessment / Reassessment - multiple wounds 2 5 []  - Dermatologic / Skin Assessment (not related to wound area) 0 ASSESSMENTS - Focused Assessment []  - Circumferential Edema Measurements - multi extremities 0 []  - Nutritional Assessment / Counseling / Intervention 0 []  - Lower Extremity Assessment (monofilament, tuning fork, pulses) 0 []  - Peripheral Arterial Disease Assessment (using hand held doppler) 0 ASSESSMENTS - Ostomy and/or Continence Assessment and Care []  - Incontinence Assessment and Management 0 []  - Ostomy Care Assessment and Management (repouching, etc.) 0 PROCESS - Coordination of Care X - Simple Patient / Family Education for ongoing care 1 15 []  - Complex (extensive) Patient / Family Education for ongoing care 0 []  - Staff obtains Programmer, systems, Records, Test Results / Process Orders 0 []  - Staff telephones HHA, Nursing Homes / Clarify orders / etc 0 []  - Routine Transfer to another Facility (non-emergent condition) 0 [name], [name] ([id number]) []  - Routine Hospital Admission (non-emergent condition) 0 []  - New Admissions / Biomedical engineer / Ordering NPWT, Apligraf, etc. 0 []  - Emergency Hospital Admission (emergent condition) 0 []  - Simple Discharge Coordination 0 X - Complex (extensive) Discharge Coordination 1 15 PROCESS - Special Needs []  - Pediatric / Minor Patient Management 0 []  - Isolation Patient Management 0 []  - Hearing / Language / Visual special needs 0 []  - Assessment of Community assistance (transportation, D/C planning, etc.) 0 []  - Additional assistance / Altered mentation 0 []  - Support Surface(s) Assessment (bed, cushion, seat, etc.)  0 INTERVENTIONS - Wound Cleansing / Measurement []  - Simple Wound Cleansing - one wound 0 []  - Complex Wound Cleansing - multiple wounds 0 X - Wound Imaging (photographs - any number  of wounds) 1 5 []  - Wound Tracing (instead of photographs) 0 []  - Simple Wound Measurement - one wound 0 []  - Complex Wound Measurement - multiple wounds 0 INTERVENTIONS - Wound Dressings []  - Small Wound Dressing one or multiple wounds 0 []  - Medium Wound Dressing one or multiple wounds 0 []  - Large Wound Dressing one or multiple wounds 0 []  - Application of Medications - topical 0 []  - Application of Medications - injection 0 INTERVENTIONS - Miscellaneous []  - External ear exam 0 [name], [name] ([id number]) []  - Specimen Collection (cultures, biopsies, blood, body fluids, etc.) 0 []  - Specimen(s) / Culture(s) sent or taken to Lab for analysis 0 []  - Patient Transfer (multiple staff / Harrel Lemon Lift / Similar devices) 0 []  - Simple Staple / Suture removal (25 or less) 0 []  - Complex Staple / Suture removal (26 or more) 0 []  - Hypo / Hyperglycemic Management (close monitor of Blood Glucose) 0 []  - Ankle / Brachial Index (ABI) - do not check if billed separately 0 X - Vital Signs 1 5 Has the patient been seen at the hospital within the last three years: Yes Total Score: 65 Level Of Care: New/Established - Level 2 Electronic Signature(s) Signed: 04/26/2015 5:24:19 PM By: Alric Quan Entered By: Alric Quan on 04/25/2015 13:19:41 [name], [name] ([id number]) -------------------------------------------------------------------------------- Encounter Discharge Information Details Patient Name: Gwendolyn Morn B. Date of Service: 04/25/2015 12:45 PM Medical Record Number: [id number] Patient Account Number: [id number] Date of Birth/Sex: 12-Mar-1926 (82 y.o. Female) Treating RN: Ahmed Prima Primary Care Physician: Deborra Medina Other Clinician: Referring Physician: Deborra Medina Treating  Physician/Extender: Frann Rider in Treatment: 1 Encounter Discharge Information Items Discharge Pain Level: 0 Discharge Condition: Stable Ambulatory Status: Ambulatory Discharge Destination: Home Transportation: Private Auto Accompanied By: self Schedule Follow-up Appointment: Yes Medication Reconciliation completed and provided to Patient/Care Yes Sera Hitsman: Provided on Clinical Summary of Care: 04/25/2015 Form Type Recipient Paper Patient [facility] Electronic Signature(s) Signed: 04/25/2015 1:07:56 PM By: Ruthine Dose Entered By: Ruthine Dose on 04/25/2015 13:07:55 Fults, Braylee B. ([id number]) -------------------------------------------------------------------------------- Lower Extremity Assessment Details Patient Name: Becherer, Danasha B. Date of Service: 04/25/2015 12:45 PM Medical Record Number: [id number] Patient Account Number: [id number] Date of Birth/Sex: 1926/06/22 (82 y.o. Female) Treating RN: Carolyne Fiscal, Debi Primary Care Physician: Deborra Medina Other Clinician: Referring Physician: Deborra Medina Treating Physician/Extender: Frann Rider in Treatment: 1 Vascular Assessment Pulses: Posterior Tibial Dorsalis Pedis Palpable: [Right:Yes] Extremity colors, hair growth, and conditions: Extremity Color: [Right:Hyperpigmented] Capillary Refill: [Right:< 3 seconds] Electronic Signature(s) Signed: 04/26/2015 5:24:19 PM By: Alric Quan Entered By: Alric Quan on 04/25/2015 12:57:01 Holtzer, Breonna B. ([id number]) -------------------------------------------------------------------------------- Multi Wound Chart Details Patient Name: Seabrook, Tanisa B. Date of Service: 04/25/2015 12:45 PM Medical Record Number: [id number] Patient Account Number: [id number] Date of Birth/Sex: 10/17/1926 (82 y.o. Female) Treating RN: Ahmed Prima Primary Care Physician: Deborra Medina Other Clinician: Referring Physician: Deborra Medina Treating Physician/Extender: Frann Rider in Treatment: 1 Vital Signs Height(in): 66 Pulse(bpm): 62 Weight(lbs): 133 Blood Pressure 146/55 (mmHg): Body Mass Index(BMI): 21 Temperature(F): 97.4 Respiratory Rate 20 (breaths/min): Photos: [1:No Photos] [2:No Photos] [N/A:N/A] Wound Location: [1:Right, Medial Lower Leg] [2:Right, Posterior Shoulder] [N/A:N/A] Wounding Event: [1:Surgical Injury] [2:Surgical Injury] [N/A:N/A] Primary Etiology: [1:Open Surgical Wound] [2:Open Surgical Wound] [N/A:N/A] Date Acquired: [1:03/25/2015] [2:03/25/2015] [N/A:N/A] Weeks of Treatment: [1:1] [2:1] [N/A:N/A] Wound Status: [1:Healed - Epithelialized] [2:Healed - Epithelialized] [N/A:N/A] Measurements L x W x D 0x0x0 [2:0x0x0] [N/A:N/A] (cm) Area (cm) : [1:0] [2:0] [N/A:N/A] Volume (cm) : [1:0] [2:0] [  N/A:N/A] % Reduction in Area: [1:100.00%] [2:100.00%] [N/A:N/A] % Reduction in Volume: 100.00% [2:100.00%] [N/A:N/A] Classification: [1:Partial Thickness] [2:Partial Thickness] [N/A:N/A] Periwound Skin Texture: No Abnormalities Noted [2:No Abnormalities Noted] [N/A:N/A] Periwound Skin [1:No Abnormalities Noted] [2:No Abnormalities Noted] [N/A:N/A] Moisture: Periwound Skin Color: No Abnormalities Noted [2:No Abnormalities Noted] [N/A:N/A] Tenderness on [1:No] [2:No] [N/A:N/A] Treatment Notes Electronic Signature(s) Signed: 04/26/2015 5:24:19 PM By: Alric Quan Entered By: Alric Quan on 04/25/2015 13:04:31 [name], [name] ([id number]) -------------------------------------------------------------------------------- [place] Details Patient Name: Gwendolyn Morn B. Date of Service: 04/25/2015 12:45 PM Medical Record Number: [id number] Patient Account Number: [id number] Date of Birth/Sex: 05-May-1926 (82 y.o. Female) Treating RN: Carolyne Fiscal, Debi Primary Care Physician: Deborra Medina Other Clinician: Referring Physician: Deborra Medina Treating Physician/Extender: Frann Rider in Treatment:  1 Active Inactive Electronic Signature(s) Signed: 04/26/2015 5:24:19 PM By: Alric Quan Entered By: Alric Quan on 04/25/2015 13:18:19 [name], [name] ([id number]) -------------------------------------------------------------------------------- Pain Assessment Details Patient Name: [name], [name] Date of Service: 04/25/2015 12:45 PM Medical Record Number: [id number] Patient Account Number: [id number] Date of Birth/Sex: September 13, 1926 (82 y.o. Female) Treating RN: Ahmed Prima Primary Care Physician: Deborra Medina Other Clinician: Referring Physician: Deborra Medina Treating Physician/Extender: Frann Rider in Treatment: 1 Active Problems Location of Pain Severity and Description of Pain Patient Has Paino No Site Locations Pain Management and Medication Current Pain Management: Electronic Signature(s) Signed: 04/26/2015 5:24:19 PM By: Alric Quan Entered By: Alric Quan on 04/25/2015 12:52:23 [name], [name] ([id number]) -------------------------------------------------------------------------------- Patient/Caregiver Education Details Patient Name: Gwendolyn Morn B. Date of Service: 04/25/2015 12:45 PM Medical Record Number: [id number] Patient Account Number: [id number] Date of Birth/Gender: 10/24/1926 (82 y.o. Female) Treating RN: Ahmed Prima Primary Care Physician: Deborra Medina Other Clinician: Referring Physician: Deborra Medina Treating Physician/Extender: Frann Rider in Treatment: 1 Education Assessment Education Provided To: Patient Education Topics Provided Wound/Skin Impairment: Handouts: Other: Please call us if you need anything if you need [place] or if you have any questions. Methods: Demonstration, Explain/Verbal Responses: State content correctly Electronic Signature(s) Signed: 04/26/2015 5:24:19 PM By: Alric Quan Entered By: Alric Quan on 04/25/2015 13:06:14 [name], Gwendolyn Center B.  ([id number]) -------------------------------------------------------------------------------- Wound Assessment Details Patient Name: [name], [name] Date of Service: 04/25/2015 12:45 PM Medical Record Number: [id number] Patient Account Number: [id number] Date of Birth/Sex: April 09, 1926 (82 y.o. Female) Treating RN: Carolyne Fiscal, Debi Primary Care Physician: Deborra Medina Other Clinician: Referring Physician: Deborra Medina Treating Physician/Extender: Frann Rider in Treatment: 1 Wound Status Wound Number: 1 Primary Etiology: Open Surgical Wound Wound Location: Right, Medial Lower Leg Wound Status: Healed - Epithelialized Wounding Event: Surgical Injury Date Acquired: 03/25/2015 Weeks Of Treatment: 1 Clustered Wound: No Photos Photo Uploaded By: Alric Quan on 04/25/2015 15:18:33 Wound Measurements Length: (cm) 0 % Reduction Width: (cm) 0 % Reduction Depth: (cm) 0 Area: (cm) 0 Volume: (cm) 0 in Area: 100% in Volume: 100% Wound Description Classification: Partial Thickness Periwound Skin Texture Texture Color No Abnormalities Noted: No No Abnormalities Noted: No Moisture No Abnormalities Noted: No Electronic Signature(s) Signed: 04/26/2015 5:24:19 PM By: Raina Mina, [name] ([id number]) Entered By: Alric Quan on 04/25/2015 13:04:11 [name], [name] B. ([id number]) -------------------------------------------------------------------------------- Wound Assessment Details Patient Name: Steinberg, Lesleyann B. Date of Service: 04/25/2015 12:45 PM Medical Record Number: [id number] Patient Account Number: [id number] Date of Birth/Sex: August 27, 1926 (82 y.o. Female) Treating RN: Ahmed Prima Primary Care Physician: Deborra Medina Other Clinician: Referring Physician: Deborra Medina Treating Physician/Extender: Frann Rider in Treatment: 1 Wound Status Wound Number: 2 Primary Etiology: Open Surgical Wound Wound Location: Right, Posterior  Shoulder Wound Status: Healed - Epithelialized Wounding Event: Surgical Injury Date Acquired: 03/25/2015 Weeks Of Treatment: 1 Clustered Wound: No Photos Photo Uploaded By: Alric Quan on 04/25/2015 15:18:33 Wound Measurements Length: (cm) 0 % Reduction Width: (cm) 0 % Reduction Depth: (cm) 0 Area: (cm) 0 Volume: (cm) 0 in Area: 100% in Volume: 100% Wound Description Classification: Partial Thickness Periwound Skin Texture Texture Color No Abnormalities Noted: No No Abnormalities Noted: No Moisture No Abnormalities Noted: No Electronic Signature(s) Signed: 04/26/2015 5:24:19 PM By: Raina Mina, [name] ([id number]) Entered By: Alric Quan on 04/25/2015 13:04:12 Rosenbach, Laparis BMarland White ([id number]) -------------------------------------------------------------------------------- Vitals Details Patient Name: Reen, [place] B. Date of Service: 04/25/2015 12:45 PM Medical Record Number: [id number] Patient Account Number: [id number] Date of Birth/Sex: 1926-05-09 (82 y.o. Female) Treating RN: Carolyne Fiscal, Debi Primary Care Physician: Deborra Medina Other Clinician: Referring Physician: Deborra Medina Treating Physician/Extender: Frann Rider in Treatment: 1 Vital Signs Time Taken: 12:52 Temperature (F): 97.4 Height (in): 66 Pulse (bpm): 62 Weight (lbs): 133 Respiratory Rate (breaths/min): 20 Body Mass Index (BMI): 21.5 Blood Pressure (mmHg): 146/55 Reference Range: 80 - 120 mg / dl Electronic Signature(s) Signed: 04/26/2015 5:24:19 PM By: Alric Quan Entered By: Alric Quan on 04/25/2015 12:55:20

## 2015-04-27 NOTE — Progress Notes (Signed)
[name] ([id number]) Visit Report for 04/25/2015 Chief Complaint Document Details Patient Name: [name], [name] 04/25/2015 12:45 Date of Service: PM Medical Record [id number] Number: Patient Account Number: [id number] 1926/12/14 (82 y.o. Treating RN: Ahmed Prima Date of Birth/Sex: Female) Other Clinician: Primary Care Physician: Deborra Medina Treating Christin Fudge Referring Physician: Deborra Medina Physician/Extender: Suella Grove in Treatment: 1 Information Obtained from: Patient Chief Complaint Patient presents to the wound care center with open non-healing surgical wound(s) which she's had for 3 weeks on her right lower extremity and right upper back Electronic Signature(s) Signed: 04/25/2015 1:07:29 PM By: Christin Fudge MD, FACS Entered By: Christin Fudge on 04/25/2015 13:07:29 [name] ([id number]) -------------------------------------------------------------------------------- HPI Details Patient Name: [name] 04/25/2015 12:45 Date of Service: PM Medical Record [id number] Number: Patient Account Number: [id number] 1926-06-12 (82 y.o. Treating RN: Ahmed Prima Date of Birth/Sex: Female) Other Clinician: Primary Care Physician: Deborra Medina Treating Christin Fudge Referring Physician: Deborra Medina Physician/Extender: Suella Grove in Treatment: 1 History of Present Illness Location: right lower extremity and right upper back Quality: Patient reports No Pain. Severity: Patient states wound (s) are getting better. Duration: Patient has had the wound for < 3 weeks prior to presenting for treatment Context: The wound occurred when the patient had skin lesions excised which were done by her dermatologist Modifying Factors: Other treatment(s) tried include:she has been applying Vaseline to the wounds HPI Description: 82 year old patient who was seen by her primary Dr. [name] for a nonhealing wound of the skin where she had biopsies done on March  20. The lesions were excised and came back as basal cell carcinomas. There was one on her right medial calf and the other on the right posterior shoulder. after reviewing the wounds he referred her to [place] for further care. her past medical history is significant for degenerative disc disease, osteoporosis, chronic back pain, and Raynaud's phenomenon. she has never been a smoker. I reviewed the dermatology notes from her visit of March 22 patient was seen by Dr. 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 She also had a squamous cell carcinoma of the right lower leg. wounds were reviewed and found to be healing well and she was asked to come back in 6 months. Electronic Signature(s) Signed: 04/25/2015 1:07:35 PM By: Christin Fudge MD, FACS Entered By: Christin Fudge on 04/25/2015 13:07:35 [name] ([id number]) -------------------------------------------------------------------------------- Physical Exam Details Patient Name: [name], [name] 04/25/2015 12:45 Date of Service: PM Medical Record [id number] Number: Patient Account Number: [id number] 09-Apr-1926 (82 y.o. Treating RN: Ahmed Prima Date of Birth/Sex: Female) Other Clinician: Primary Care Physician: Deborra Medina Treating Christin Fudge Referring Physician: Deborra Medina Physician/Extender: Suella Grove in Treatment: 1 Constitutional . Pulse regular. Respirations normal and unlabored. Afebrile. . Eyes Nonicteric. Reactive to light. Ears, Nose, Mouth, and Throat Lips, teeth, and gums WNL.[name] Moist mucosa without lesions. Neck supple and nontender. No palpable supraclavicular or cervical adenopathy. Normal sized without goiter. Respiratory WNL. No retractions.. Cardiovascular Pedal Pulses WNL. No clubbing, cyanosis or edema. Lymphatic No adneopathy. No adenopathy. No adenopathy. Musculoskeletal Adexa without tenderness  or enlargement.. Digits and nails w/o clubbing, cyanosis, infection, petechiae, ischemia, or inflammatory conditions.. Integumentary (Hair, Skin) No suspicious lesions. No crepitus or fluctuance. No peri-wound warmth or erythema. No masses.[name] Psychiatric Judgement and insight Intact.. No evidence of depression, anxiety, or agitation.. Notes Both these wounds are completely healed and epithelialized.  Electronic Signature(s) Signed: 04/25/2015 1:08:32 PM By: Christin Fudge MD, FACS Entered By: Christin Fudge on 04/25/2015 13:08:32 [name] ([id number]) -------------------------------------------------------------------------------- Physician Orders Details Patient Name: [name], [name] 04/25/2015 12:45 Date of Service: PM Medical Record [id number] Number: Patient Account Number: [id number] 1926/12/25 (82 y.o. Treating RN: Ahmed Prima Date of Birth/Sex: Female) Other Clinician: Primary Care Physician: Deborra Medina Treating Christin Fudge Referring Physician: Deborra Medina Physician/Extender: Suella Grove in Treatment: 1 Verbal / Phone Orders: Yes Clinician: Carolyne Fiscal, Debi Read Back and Verified: Yes Diagnosis Coding Discharge From [facility] Services o Discharge from [place] - Please call us if you need anything if you need [place] or if you have any questions. Electronic Signature(s) Signed: 04/25/2015 4:14:45 PM By: Christin Fudge MD, FACS Signed: 04/26/2015 5:24:19 PM By: Alric Quan Entered By: Alric Quan on 04/25/2015 13:05:33 [name] ([id number]) -------------------------------------------------------------------------------- Problem List Details Patient Name: [name], [name] 04/25/2015 12:45 Date of Service: PM Medical Record [id number] Number: Patient Account Number: [id number] [birth year] (82 y.o. Treating RN: Ahmed Prima Date of Birth/Sex: Female) Other Clinician: Primary Care Physician: Deborra Medina Treating Christin Fudge Referring  Physician: Deborra Medina Physician/Extender: Suella Grove in Treatment: 1 Active Problems ICD-10 Encounter Code Description Active Date Diagnosis T81.31XA Disruption of external operation (surgical) wound, not 04/18/2015 Yes elsewhere classified, initial encounter C44.712 Basal cell carcinoma of skin of right lower limb, including 04/18/2015 Yes hip Inactive Problems Resolved Problems Electronic Signature(s) Signed: 04/25/2015 1:07:20 PM By: Christin Fudge MD, FACS Entered By: Christin Fudge on 04/25/2015 13:07:20 Varnadore, [name] ([id number]) -------------------------------------------------------------------------------- Progress Note Details Patient Name: [name] 04/25/2015 12:45 Date of Service: PM Medical Record [id number] Number: Patient Account Number: [id number] [birth year] (82 y.o. Treating RN: Ahmed Prima Date of Birth/Sex: Female) Other Clinician: Primary Care Physician: Deborra Medina Treating Christin Fudge Referring Physician: Deborra Medina Physician/Extender: Suella Grove in Treatment: 1 Subjective Chief Complaint Information obtained from Patient Patient presents to the wound care center with open non-healing surgical wound(s) which she's had for 3 weeks on her right lower extremity and right upper back History of Present Illness (HPI) The following HPI elements were documented for the patient's wound: Location: right lower extremity and right upper back Quality: Patient reports No Pain. Severity: Patient states wound (s) are getting better. Duration: Patient has had the wound for < 3 weeks prior to presenting for treatment Context: The wound occurred when the patient had skin lesions excised which were done by her dermatologist Modifying Factors: Other treatment(s) tried include:she has been applying Vaseline to the wounds 81 year old patient who was seen by her primary Dr. [name] for a nonhealing wound of the skin where she had biopsies done on March  20. The lesions were excised and came back as basal cell carcinomas. There was one on her right medial calf and the other on the right posterior shoulder. after reviewing the wounds he referred her to [place] for further care. her past medical history is significant for degenerative disc disease, osteoporosis, chronic back pain, and Raynaud's phenomenon. she has never been a smoker. I reviewed the dermatology notes from her visit of March 22 patient was seen by Dr. 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 She also had a squamous cell carcinoma of the right lower leg. wounds were reviewed and found to be healing well and she was asked to come back in 6 months. Objective Constitutional Pulse  regular. Respirations normal and unlabored. Afebrile. Goslin, [name] ([id number]) Vitals Time Taken: 12:52 PM, Height: 66 in, Weight: 133 lbs, BMI: 21.5, Temperature: 97.4 F, Pulse: 62 bpm, Respiratory Rate: 20 breaths/min, Blood Pressure: 146/55 mmHg. Eyes Nonicteric. Reactive to light. Ears, Nose, Mouth, and Throat Lips, teeth, and gums WNL.[name] Moist mucosa without lesions. Neck supple and nontender. No palpable supraclavicular or cervical adenopathy. Normal sized without goiter. Respiratory WNL. No retractions.. Cardiovascular Pedal Pulses WNL. No clubbing, cyanosis or edema. Lymphatic No adneopathy. No adenopathy. No adenopathy. Musculoskeletal Adexa without tenderness or enlargement.. Digits and nails w/o clubbing, cyanosis, infection, petechiae, ischemia, or inflammatory conditions.[name] Psychiatric Judgement and insight Intact.. No evidence of depression, anxiety, or agitation.. General Notes: Both these wounds are completely healed and epithelialized. Integumentary (Hair, Skin) No suspicious lesions. No crepitus or fluctuance. No peri-wound warmth or erythema. No masses.. Wound #1 status is  Healed - Epithelialized. Original cause of wound was Surgical Injury. The wound is located on the Right,Medial Lower Leg. The wound measures 0cm length x 0cm width x 0cm depth; 0cm^2 area and 0cm^3 volume. Wound #2 status is Healed - Epithelialized. Original cause of wound was Surgical Injury. The wound is located on the Right,Posterior Shoulder. The wound measures 0cm length x 0cm width x 0cm depth; 0cm^2 area and 0cm^3 volume. Assessment Ranta, [name] ([id number]) Active Problems ICD-10 T81.31XA - Disruption of external operation (surgical) wound, not elsewhere classified, initial encounter C44.712 - Basal cell carcinoma of skin of right lower limb, including hip The wounds have completely healed and there are no open ulcerations. I have asked her to protect this for the next week or so and come back and see as as needed. She is discharged from the wound care services. Plan Discharge From [facility] Services: Discharge from [place] - Please call us if you need anything if you need [place] or if you have any questions. The wounds have completely healed and there are no open ulcerations. I have asked her to protect this for the next week or so and come back and see as as needed. She is discharged from the wound care services. Electronic Signature(s) Signed: 04/25/2015 1:09:39 PM By: Christin Fudge MD, FACS Entered By: Christin Fudge on 04/25/2015 13:09:39 Charpentier, [name] ([id number]) -------------------------------------------------------------------------------- SuperBill Details Patient Name: Gwendolyn Morn B. Date of Service: 04/25/2015 Medical Record Number: [id number] Patient Account Number: [id number] Date of Birth/Sex: Sep 23, 1926 (82 y.o. Female) Treating RN: Carolyne Fiscal, Debi Primary Care Physician: Deborra Medina Other Clinician: Referring Physician: Deborra Medina Treating Physician/Extender: Frann Rider in Treatment: 1 Diagnosis Coding ICD-10 Codes Code  Description Disruption of external operation (surgical) wound, not elsewhere classified, initial T81.31XA encounter C44.712 Basal cell carcinoma of skin of right lower limb, including hip Facility Procedures CPT4 Code: [id number] Description: [phone] - WOUND CARE VISIT-LEV 2 EST PT Modifier: Quantity: 1 Physician Procedures CPT4: Description Modifier Quantity Code [id number] - WC PHYS LEVEL 2 - EST PT 1 ICD-10 Description Diagnosis T81.31XA Disruption of external operation (surgical) wound, not elsewhere classified, initial encounter C44.712 Basal cell carcinoma of skin  of right lower limb, including hip Electronic Signature(s) Signed: 04/25/2015 4:14:45 PM By: Christin Fudge MD, FACS Signed: 04/26/2015 5:24:19 PM By: Alric Quan Previous Signature: 04/25/2015 1:10:14 PM Version By: Christin Fudge MD, FACS Entered By: Alric Quan on 04/25/2015 13:19:55

## 2015-04-30 NOTE — Telephone Encounter (Signed)
Patient scheduled an appointment to come in and discuss as to  would it really benefit her to have the Prolia injection at her age  if cost is $215.00 and patient does not want to contact prolia for assistance. FYI

## 2015-05-27 ENCOUNTER — Encounter: Payer: Self-pay | Admitting: Internal Medicine

## 2015-05-27 ENCOUNTER — Ambulatory Visit (INDEPENDENT_AMBULATORY_CARE_PROVIDER_SITE_OTHER): Payer: Medicare Other | Admitting: Internal Medicine

## 2015-05-27 VITALS — BP 148/82 | HR 75 | Temp 97.5°F | Resp 12 | Ht 66.0 in | Wt 130.0 lb

## 2015-05-27 DIAGNOSIS — M810 Age-related osteoporosis without current pathological fracture: Secondary | ICD-10-CM | POA: Diagnosis not present

## 2015-05-27 DIAGNOSIS — B880 Other acariasis: Secondary | ICD-10-CM | POA: Insufficient documentation

## 2015-05-27 DIAGNOSIS — K5900 Constipation, unspecified: Secondary | ICD-10-CM | POA: Diagnosis not present

## 2015-05-27 MED ORDER — BETAMETHASONE VALERATE 0.1 % EX OINT: 1.0000 "application " | TOPICAL_OINTMENT | Freq: Two times a day (BID) | CUTANEOUS | Status: DC

## 2015-05-27 NOTE — Assessment & Plan Note (Signed)
Chest wall,  No improvement with triamcinolone ointment.  Hight potency ointment given.

## 2015-05-27 NOTE — Assessment & Plan Note (Signed)
She has decided against prolia injections.

## 2015-05-27 NOTE — Progress Notes (Signed)
Pre-visit discussion using our clinic review tool. No additional management support is needed unless otherwise documented below in the visit note.  

## 2015-05-27 NOTE — Patient Instructions (Addendum)
For your constipation:  You need a minimum of 48 ounces of non caffeinated beverages daily  Continue both Equates (metamucil and colace) twice  daily   Add the sennakot not more than 2 times week   Try a magnesium supplement once daily   Increase your fiber intake (see handout)    For your itching:  I have sent in a high potency steroid cream to use on your chigger bites twice daily (betamethasone)

## 2015-05-27 NOTE — Progress Notes (Signed)
Subjective:  Patient ID: [name], female    DOB: 08/11/1926  Age: 82 y.o. MRN: [id number]  CC: The primary encounter diagnosis was Osteoporosis. Diagnoses of Chigger bite and Constipation, unspecified constipation type were also pertinent to this visit.  HPI [name] presents for  1) discussion about Prolia  2) constipation 3) chigger bite on left chst wall red and scabbed.  Triamcinolone ointment not working.   Bowels not moving on their own .  Using lactulose every  3 days  Using metamucil and colace 100 mg both twice  Daily.  Averaging one use of sennakot weekl y    Drinking a sip of red wine every night   Outpatient Prescriptions Prior to Visit  Medication Sig Dispense Refill  . Black Cohosh (BLACK COHOSH HOT FLASH RELIEF) 40 MG CAPS Take 1 capsule by mouth as needed.     . clorazepate (TRANXENE) 7.5 MG tablet TAKE ONE TABLET AT BEDTIME MAY REPEAT ONCE IF NEEDED 60 tablet 3  . diazepam (VALIUM) 5 MG tablet Take 1 tablet (5 mg total) by mouth every 8 (eight) hours as needed for anxiety. 90 tablet 3  . docusate sodium (COLACE) 100 MG capsule Take 1 capsule (100 mg total) by mouth 2 (two) times daily. 60 capsule 11  . hydrOXYzine (ATARAX/VISTARIL) 25 MG tablet TAKE ONE (1) CAPSULE THREE (3) TIMES EACH DAY AS NEEDED 90 tablet 3  . Iron-Vitamins (GERITOL PO) Take by mouth.      . lactulose (CHRONULAC) 10 GM/15ML solution TAKE 30MLS(0NE OUNCE) BY MOUTH 2 TIMES DAILY AS NEEDED FOR MILD CONSTIPATION. DO NOT USE MORE THAN ONCE A WEEK 240 mL 2  . Lutein 20 MG CAPS Take 20 mg by mouth daily.      . traMADol (ULTRAM) 50 MG tablet Take 1 tablet (50 mg total) by mouth every 8 (eight) hours as needed. 90 tablet 2  . aspirin 81 MG tablet Take 81 mg by mouth daily. Reported on 05/27/2015    . CRANBERRY CONCENTRATE PO Take by mouth. Reported on 05/27/2015    . Diclofenac Sodium CR 100 MG 24 hr tablet Take 1 tablet (100 mg total) by mouth daily. With a meal for back pain (Patient not  taking: Reported on 05/27/2015) 30 tablet 3  . fluticasone (FLONASE) 50 MCG/ACT nasal spray Place 2 sprays into both nostrils daily as needed. Reported on 05/27/2015     No facility-administered medications prior to visit.    Review of Systems;  Patient denies headache, fevers, malaise, unintentional weight loss, skin rash, eye pain, sinus congestion and sinus pain, sore throat, dysphagia,  hemoptysis , cough, dyspnea, wheezing, chest pain, palpitations, orthopnea, edema, abdominal pain, nausea, melena, diarrhea, constipation, flank pain, dysuria, hematuria, urinary  Frequency, nocturia, numbness, tingling, seizures,  Focal weakness, Loss of consciousness,  Tremor, insomnia, depression, anxiety, and suicidal ideation.      Objective:  BP 148/82 mmHg  Pulse 75  Temp(Src) 97.5 F (36.4 C) (Oral)  Resp 12  Ht 5\' 6"  (1.676 m)  Wt 130 lb (58.968 kg)  BMI 20.99 kg/m2  SpO2 98%  BP Readings from Last 3 Encounters:  05/27/15 148/82  04/17/15 106/68  04/01/15 126/68    Wt Readings from Last 3 Encounters:  05/27/15 130 lb (58.968 kg)  04/17/15 134 lb 12.8 oz (61.145 kg)  04/01/15 133 lb 8 oz (60.555 kg)    General appearance: alert, cooperative and appears stated age Ears: normal TM's and external ear canals both ears Throat:  lips, mucosa, and tongue normal; teeth and gums normal Neck: no adenopathy, no carotid bruit, supple, symmetrical, trachea midline and thyroid not enlarged, symmetric, no tenderness/mass/nodules Back: symmetric, no curvature. ROM normal. No CVA tenderness. Lungs: clear to auscultation bilaterally Chest: erythematous bug bite left chest wall.  Recently bled.  No indication for cellulitis  Heart: regular rate and rhythm, S1, S2 normal, no murmur, click, rub or gallop Abdomen: soft, non-tender; bowel sounds normal; no masses,  no organomegaly Pulses: 2+ and symmetric Skin: Skin color, texture, turgor normal. No rashes or lesions Lymph nodes: Cervical,  supraclavicular, and axillary nodes normal.  No results found for: HGBA1C  Lab Results  Component Value Date   CREATININE 0.79 01/04/2015   CREATININE 0.84 06/13/2014   CREATININE 0.9 07/20/2013    Lab Results  Component Value Date   WBC 6.5 01/04/2015   HGB 12.3 01/04/2015   HCT 36.6 01/04/2015   PLT 255.0 01/04/2015   GLUCOSE 86 01/04/2015   CHOL 175 07/20/2013   TRIG 79.0 07/20/2013   HDL 37.70* 07/20/2013   LDLDIRECT 145.6 12/21/2012   LDLCALC 122* 07/20/2013   ALT 11 01/04/2015   AST 19 01/04/2015   NA 130* 01/04/2015   K 4.5 01/04/2015   CL 94* 01/04/2015   CREATININE 0.79 01/04/2015   BUN 15 01/04/2015   CO2 30 01/04/2015   TSH 1.92 01/04/2015    No results found.  Assessment & Plan:   Problem List Items Addressed This Visit    Constipation    Recommended an increase in fiber intake to 25 to 35 grams daily.  Made several dietary suggestions of high fiber content including [place] low carb whole wheat tortillas which have 26 g fiber/serving, Toufayan flatbread which has around 10 g fiber,   Daily serving of any type of nuts,  and Atkins protein bars which have 8 to 10 g fiber.   Dispelled the popularly held notion that [employer] oatmeal, whole grain bread and most commericially made cereals have adequate fiber.  Finally I recommended daily use of colace , magnesium, and  Metamucil, and limit use of Senna and lactulose to 2/week      Osteoporosis - Primary    She has decided against prolia injections.       Chigger bite    Chest wall,  No improvement with triamcinolone ointment.  Hight potency ointment given.         A total of 25 minutes of face to face time was spent with patient more than half of which was spent in counselling about the above mentioned conditions  and coordination of care  I am having [name] start on betamethasone valerate ointment. I am also having her maintain her aspirin, Iron-Vitamins (GERITOL PO), Lutein, Black Cohosh,  fluticasone, docusate sodium, lactulose, hydrOXYzine, CRANBERRY CONCENTRATE PO, clorazepate, traMADol, Diclofenac Sodium CR, and diazepam.  Meds ordered this encounter  Medications  . betamethasone valerate ointment (VALISONE) 0.1 %    Sig: Apply 1 application topically 2 (two) times daily.    Dispense:  30 g    Refill:  0    There are no discontinued medications.  Follow-up: No Follow-up on file.   Crecencio Mc, MD

## 2015-05-27 NOTE — Assessment & Plan Note (Addendum)
Recommended an increase in fiber intake to 25 to 35 grams daily.  Made several dietary suggestions of high fiber content including [place] low carb whole wheat tortillas which have 26 g fiber/serving, Touf[place] flatbread which has around 10 g fiber,   Daily serving of any type of nuts,  and Atkins protein bars which have 8 to 10 g fiber.   Dispelled the popularly held notion that [employer] oatmeal, whole grain bread and most commericially made cereals have adequate fiber.  Finally I recommended daily use of colace , magnesium, and  Metamucil, and limit use of Senna and lactulose to 2/week

## 2015-06-19 DIAGNOSIS — Z961 Presence of intraocular lens: Secondary | ICD-10-CM | POA: Diagnosis not present

## 2015-07-18 ENCOUNTER — Telehealth: Payer: Self-pay | Admitting: *Deleted

## 2015-07-18 NOTE — Telephone Encounter (Signed)
FYI

## 2015-07-18 NOTE — Telephone Encounter (Signed)
Juliann Pulse please advise I know that Providers schedule is full, thanks

## 2015-07-18 NOTE — Telephone Encounter (Signed)
Patient stated that she would like to be seen by Dr Derrel Nip only  this week , she currently has small pimples all over her body except her face  Please give a time and date -thanks

## 2015-07-18 NOTE — Telephone Encounter (Signed)
Patient has had a worsening rash since May 22, that now is all over her body reported by patient ; patient would also like to know if the rash is what is making her fill fatigued. Scheduled patient with Dr. Caryl Bis tomorrow at 3. FYI

## 2015-07-19 ENCOUNTER — Ambulatory Visit (INDEPENDENT_AMBULATORY_CARE_PROVIDER_SITE_OTHER): Payer: Medicare Other | Admitting: Family Medicine

## 2015-07-19 ENCOUNTER — Encounter: Payer: Self-pay | Admitting: Family Medicine

## 2015-07-19 VITALS — BP 140/78 | HR 63 | Temp 98.5°F | Ht 66.0 in | Wt 133.1 lb

## 2015-07-19 DIAGNOSIS — L989 Disorder of the skin and subcutaneous tissue, unspecified: Secondary | ICD-10-CM | POA: Diagnosis not present

## 2015-07-19 DIAGNOSIS — R21 Rash and other nonspecific skin eruption: Secondary | ICD-10-CM | POA: Insufficient documentation

## 2015-07-19 NOTE — Assessment & Plan Note (Signed)
Single skin lesion on left chest has persisted and not improved since May. The other rashes she reports has improved and is gone away for the most part. Discussed given persistence of this area on her chest she should be evaluated by dermatology. She would prefer [place] to make the appointment. Will place referral and she has seen [facility] [place] dermatology previously. Dermatology referral placed.

## 2015-07-19 NOTE — Progress Notes (Signed)
Pre visit review using our clinic review tool, if applicable. No additional management support is needed unless otherwise documented below in the visit note. 

## 2015-07-19 NOTE — Patient Instructions (Signed)
Nice to see you. We will have you see dermatology through [facility] to follow-up on the lesion on her left chest as this is not improved in the last several months. If you develop recurrence of rash, or fevers, or any new or changing symptoms please seek medical attention.

## 2015-07-19 NOTE — Progress Notes (Signed)
Patient ID: [name], female   DOB: 05/17/1926, 82 y.o.   MRN: [id number]  Gwendolyn Rumps, MD Phone: [phone]  [name] is a 82 y.o. female who presents today for same-day visit.  Patient notes she's had a rash since May. Started on her left breast with what was thought to be a chigger bite. Was a single lesion at that time. Since then has spread to her inner thigh areas. Notes today is the first day they have not really bothered her. The rash in her thigh area on the right has gone away. Single excoriated lesion still on her left inner thigh. The area on her chest is still there. No fevers. No new soaps or detergents. No new medications. She feels well overall. Has been using a topical triamcinolone lotion and antihistamine to help with this.  ROS see history of present illness  Objective  Physical Exam Filed Vitals:   07/19/15 1440  BP: 140/78  Pulse: 63  Temp: 98.5 F (36.9 C)    BP Readings from Last 3 Encounters:  07/19/15 140/78  05/27/15 148/82  04/17/15 106/68   Wt Readings from Last 3 Encounters:  07/19/15 133 lb 2 oz (60.385 kg)  05/27/15 130 lb (58.968 kg)  04/17/15 134 lb 12.8 oz (61.145 kg)    Physical Exam  Constitutional: She is well-developed, well-nourished, and in no distress.  HENT:  Head: Normocephalic and atraumatic.  Cardiovascular: Normal rate and regular rhythm.   Pulmonary/Chest: Effort normal and breath sounds normal.  Neurological: She is alert.  Skin:  Single erythematous papule on left chest, excoriated papule left inner thigh, no other evidence of rash on her legs or chest, she denies rash elsewhere     Assessment/Plan: Please see individual problem list.  Rash and nonspecific skin eruption Single skin lesion on left chest has persisted and not improved since May. The other rashes she reports has improved and is gone away for the most part. Discussed given persistence of this area on her chest she should be evaluated by  dermatology. She would prefer [place] to make the appointment. Will place referral and she has seen [facility] dermatology previously. Dermatology referral placed.    Orders Placed This Encounter  Procedures  . Ambulatory referral to Dermatology    Referral Priority:  Routine    Referral Type:  Consultation    Referral Reason:  Specialty Services Required    Requested Specialty:  Dermatology    Number of Visits Requested:  1    Gwendolyn Rumps, MD [place]

## 2015-07-24 ENCOUNTER — Telehealth: Payer: Self-pay | Admitting: *Deleted

## 2015-07-24 NOTE — Telephone Encounter (Signed)
Noted  

## 2015-07-24 NOTE — Telephone Encounter (Signed)
FYI thanks.

## 2015-07-24 NOTE — Telephone Encounter (Signed)
Pt wanted to FYI Dr. Caryl Bis that she did get an appt with the dermatologist on 08/12/15 in [place] with Dr. Rolene Course

## 2015-07-29 ENCOUNTER — Ambulatory Visit (INDEPENDENT_AMBULATORY_CARE_PROVIDER_SITE_OTHER): Payer: Medicare Other

## 2015-07-29 ENCOUNTER — Ambulatory Visit (INDEPENDENT_AMBULATORY_CARE_PROVIDER_SITE_OTHER): Payer: Medicare Other | Admitting: Family Medicine

## 2015-07-29 ENCOUNTER — Encounter: Payer: Self-pay | Admitting: Family Medicine

## 2015-07-29 VITALS — BP 140/80 | HR 68 | Temp 97.8°F | Wt 128.1 lb

## 2015-07-29 DIAGNOSIS — M898X8 Other specified disorders of bone, other site: Secondary | ICD-10-CM

## 2015-07-29 DIAGNOSIS — M899 Disorder of bone, unspecified: Secondary | ICD-10-CM

## 2015-07-29 DIAGNOSIS — M25552 Pain in left hip: Secondary | ICD-10-CM | POA: Diagnosis not present

## 2015-07-29 NOTE — Progress Notes (Signed)
Gwendolyn Rumps, MD Phone: [phone]  [name] is a 82 y.o. female who presents today for same-day visit.  Left iliac pain: Patient notes she has chronic low back pain. Has had some left iliac pain as well. Notes a history of scoliosis and spinal stenosis. In the past she notes she has had constipation and had to strain hard for bowel movements. Notes this resulted in back discomfort in 2015. Notes a left sided discomfort in the area of her left iliac that keeps her from standing for too long and will let her walk for too long due to the discomfort. Occasionally has pain radiating down the front of her leg. No urinary incontinence. She notes significant constipation at times for which she takes 3-4 different medications. When she takes the maximum dosage of each of the medications she will occasionally have some stool leakage though this rarely occurs. No recent stool leakage. No saddle anesthesia. No fevers. She had an MRI September 2016 that revealed healed L1 compression fracture and advanced lumbar scoliosis and degenerative changes that were stable with left-sided neural impingement appearing maximal at L4-L5. She does not take anything for pain at this time. Has seen physiatry in the past.   ROS see history of present illness  Objective  Physical Exam Vitals:   07/29/15 0810  BP: 140/80  Pulse: 68  Temp: 97.8 F (36.6 C)    BP Readings from Last 3 Encounters:  07/29/15 140/80  07/19/15 140/78  05/27/15 (!) 148/82   Wt Readings from Last 3 Encounters:  07/29/15 128 lb 2 oz (58.1 kg)  07/19/15 133 lb 2 oz (60.4 kg)  05/27/15 130 lb (59 kg)    Physical Exam  Constitutional: No distress.  HENT:  Head: Normocephalic and atraumatic.  Cardiovascular: Normal rate, regular rhythm and normal heart sounds.   Pulmonary/Chest: Effort normal and breath sounds normal.  Genitourinary:  Genitourinary Comments: Normal rectal tone, firm stool noted in rectal vault    Musculoskeletal:  No midline spine tenderness, no midline spine step-off, no muscular back tenderness, no tenderness over her left iliac or hip, full internal and external range of motion bilateral hips with no pain  Neurological: She is alert. Gait normal.  5 out of 5 strength bilateral quads, hamstrings, plantar flexion, and dorsiflexion, sensation light touch intact bilateral lower extremities, 2+ patellar reflexes  Skin: She is not diaphoretic.     Assessment/Plan: Please see individual problem list.  Iliac bone pain Pain today is really over the patient's left iliac. Not true back pain. She is neurologically intact in the lower extremities. Does leak a small amount of stool if she takes all 3 or 4 of her stool softeners though does not leak at any other time. That has been stable over the last year. Unsure of cause of iliac pain though she does have significant pathology in her back with scoliosis and degenerative changes that could be accounting for her discomfort. We will obtain a left hip x-ray and pelvis x-ray to evaluate this discomfort further. Once this returns we will determine the next step in management.    Orders Placed This Encounter  Procedures  . DG HIP UNILAT WITH PELVIS 2-3 VIEWS LEFT    Standing Status:   Future    Number of Occurrences:   1    Standing Expiration Date:   09/28/2016    Order Specific Question:   Reason for Exam (SYMPTOM  OR DIAGNOSIS REQUIRED)    Answer:   left iliac  pain    Order Specific Question:   Preferred imaging location?    Answer:   [facility]    No orders of the defined types were placed in this encounter.  Gwendolyn Rumps, MD [place]

## 2015-07-29 NOTE — Assessment & Plan Note (Signed)
Pain today is really over the patient's left iliac. Not true back pain. She is neurologically intact in the lower extremities. Does leak a small amount of stool if she takes all 3 or 4 of her stool softeners though does not leak at any other time. That has been stable over the last year. Unsure of cause of iliac pain though she does have significant pathology in her back with scoliosis and degenerative changes that could be accounting for her discomfort. We will obtain a left hip x-ray and pelvis x-ray to evaluate this discomfort further. Once this returns we will determine the next step in management.

## 2015-07-29 NOTE — Patient Instructions (Signed)
Nice to see you. We are going to get an x-ray of your left hip and pelvis to evaluate your discomfort. If you develop numbness, weakness, change in bowel or bladder function, fevers, or any new or change in symptoms please seek medical attention.

## 2015-08-12 DIAGNOSIS — L821 Other seborrheic keratosis: Secondary | ICD-10-CM | POA: Diagnosis not present

## 2015-08-12 DIAGNOSIS — L508 Other urticaria: Secondary | ICD-10-CM | POA: Diagnosis not present

## 2015-08-12 DIAGNOSIS — L570 Actinic keratosis: Secondary | ICD-10-CM | POA: Diagnosis not present

## 2015-08-15 ENCOUNTER — Encounter: Payer: Self-pay | Admitting: Internal Medicine

## 2015-08-15 ENCOUNTER — Ambulatory Visit (INDEPENDENT_AMBULATORY_CARE_PROVIDER_SITE_OTHER): Payer: Medicare Other | Admitting: Internal Medicine

## 2015-08-15 VITALS — BP 130/88 | HR 72 | Resp 18 | Wt 130.1 lb

## 2015-08-15 DIAGNOSIS — G8929 Other chronic pain: Secondary | ICD-10-CM | POA: Diagnosis not present

## 2015-08-15 DIAGNOSIS — M25552 Pain in left hip: Secondary | ICD-10-CM | POA: Diagnosis not present

## 2015-08-15 DIAGNOSIS — R350 Frequency of micturition: Secondary | ICD-10-CM | POA: Diagnosis not present

## 2015-08-15 DIAGNOSIS — Z79891 Long term (current) use of opiate analgesic: Secondary | ICD-10-CM | POA: Diagnosis not present

## 2015-08-15 DIAGNOSIS — Z79899 Other long term (current) drug therapy: Secondary | ICD-10-CM | POA: Diagnosis not present

## 2015-08-15 LAB — POCT URINALYSIS DIPSTICK
Bilirubin, UA: NEGATIVE
Blood, UA: NEGATIVE
Glucose, UA: NEGATIVE
Ketones, UA: NEGATIVE
Leukocytes, UA: NEGATIVE
Nitrite, UA: NEGATIVE
Protein, UA: NEGATIVE
Spec Grav, UA: 1.015
Urobilinogen, UA: 0.2
pH, UA: 7.5

## 2015-08-15 NOTE — Progress Notes (Signed)
Subjective:  Patient ID: [name], female    DOB: 07-10-1926  Age: 82 y.o. MRN: [id number]  CC: The primary encounter diagnosis was Urinary frequency. A diagnosis of Chronic left hip pain was also pertinent to this visit.  HPI [name] presents for follow up on chronic back and left iliac crest pain .  2 years duration .  July 24:  Seen by ES  Left hip reimaged, films  looked,  ok ,  DJD lumbar spine was  noted.  Using tramadol  Up to 100 mg at a time with no change in pain other than decreasing pain to a 7 .   .  Saw chiropractor Dr Raul Del a week ago , massage made it worse.  Pain is left iliac crest and radiates to hip bone.    Has been having   Increased urination for the last 2 days,  No burning .  Drinking a lot of water.  Urine dipstick is pending     Outpatient Medications Prior to Visit  Medication Sig Dispense Refill  . Black Cohosh (BLACK COHOSH HOT FLASH RELIEF) 40 MG CAPS Take 1 capsule by mouth as needed.     . clorazepate (TRANXENE) 7.5 MG tablet TAKE ONE TABLET AT BEDTIME MAY REPEAT ONCE IF NEEDED 60 tablet 3  . CRANBERRY CONCENTRATE PO Take by mouth. Reported on 05/27/2015    . diazepam (VALIUM) 5 MG tablet Take 1 tablet (5 mg total) by mouth every 8 (eight) hours as needed for anxiety. 90 tablet 3  . docusate sodium (COLACE) 100 MG capsule Take 1 capsule (100 mg total) by mouth 2 (two) times daily. 60 capsule 11  . hydrOXYzine (ATARAX/VISTARIL) 25 MG tablet TAKE ONE (1) CAPSULE THREE (3) TIMES EACH DAY AS NEEDED 90 tablet 3  . Iron-Vitamins (GERITOL PO) Take by mouth.      . lactulose (CHRONULAC) 10 GM/15ML solution TAKE 30MLS(0NE OUNCE) BY MOUTH 2 TIMES DAILY AS NEEDED FOR MILD CONSTIPATION. DO NOT USE MORE THAN ONCE A WEEK 240 mL 2  . Lutein 20 MG CAPS Take 20 mg by mouth daily.      . traMADol (ULTRAM) 50 MG tablet Take 1 tablet (50 mg total) by mouth every 8 (eight) hours as needed. 90 tablet 2  . aspirin 81 MG tablet Take 81 mg by mouth daily. Reported  on 05/27/2015    . betamethasone valerate ointment (VALISONE) 0.1 % Apply 1 application topically 2 (two) times daily. 30 g 0  . Diclofenac Sodium CR 100 MG 24 hr tablet Take 1 tablet (100 mg total) by mouth daily. With a meal for back pain 30 tablet 3  . fluticasone (FLONASE) 50 MCG/ACT nasal spray Place 2 sprays into both nostrils daily as needed. Reported on 05/27/2015     No facility-administered medications prior to visit.     Review of Systems;  Patient denies headache, fevers, malaise, unintentional weight loss, skin rash, eye pain, sinus congestion and sinus pain, sore throat, dysphagia,  hemoptysis , cough, dyspnea, wheezing, chest pain, palpitations, orthopnea, edema, abdominal pain, nausea, melena, diarrhea, constipation, flank pain, dysuria, hematuria, urinary  Frequency, nocturia, numbness, tingling, seizures,  Focal weakness, Loss of consciousness,  Tremor, insomnia, depression, anxiety, and suicidal ideation.      Objective:  BP 130/88   Pulse 72   Resp 18   Wt 130 lb 2 oz (59 kg)   SpO2 97%   BMI 21.00 kg/m   BP Readings from Last 3 Encounters:  08/15/15 130/88  07/29/15 140/80  07/19/15 140/78    Wt Readings from Last 3 Encounters:  08/15/15 130 lb 2 oz (59 kg)  07/29/15 128 lb 2 oz (58.1 kg)  07/19/15 133 lb 2 oz (60.4 kg)    General appearance: alert, cooperative and appears stated age Ears: normal TM's and external ear canals both ears Throat: lips, mucosa, and tongue normal; teeth and gums normal Neck: no adenopathy, no carotid bruit, supple, symmetrical, trachea midline and thyroid not enlarged, symmetric, no tenderness/mass/nodules Back: symmetric, no curvature. ROM normal. No CVA tenderness. Lungs: clear to auscultation bilaterally Heart: regular rate and rhythm, S1, S2 normal, no murmur, click, rub or gallop Abdomen: soft, non-tender; bowel sounds normal; no masses,  no organomegaly Pulses: 2+ and symmetric Skin: Skin color, texture, turgor normal.  No rashes or lesions Lymph nodes: Cervical, supraclavicular, and axillary nodes normal.  No results found for: HGBA1C  Lab Results  Component Value Date   CREATININE 0.79 01/04/2015   CREATININE 0.84 06/13/2014   CREATININE 0.9 07/20/2013    Lab Results  Component Value Date   WBC 6.5 01/04/2015   HGB 12.3 01/04/2015   HCT 36.6 01/04/2015   PLT 255.0 01/04/2015   GLUCOSE 86 01/04/2015   CHOL 175 07/20/2013   TRIG 79.0 07/20/2013   HDL 37.70 (L) 07/20/2013   LDLDIRECT 145.6 12/21/2012   LDLCALC 122 (H) 07/20/2013   ALT 11 01/04/2015   AST 19 01/04/2015   NA 130 (L) 01/04/2015   K 4.5 01/04/2015   CL 94 (L) 01/04/2015   CREATININE 0.79 01/04/2015   BUN 15 01/04/2015   CO2 30 01/04/2015   TSH 1.92 01/04/2015    No results found.  Assessment & Plan:   Problem List Items Addressed This Visit    Chronic left hip pain    Reviewed all recent imaging studies with patient including recent Plain films of hip which were  normal..  Pain appears to be referred from lower back.  Trial of diclofenac, tramadol, Salon pas patch with lidocaine  and tylenol.       Increased urinary frequency - Primary    She does not have glucosuria or  a UTI. Reviewed water intake and recommended changes to regimen.         Other Visit Diagnoses   None.   A total of 25 minutes of face to face time was spent with patient more than half of which was spent in counselling about the above mentioned conditions  and coordination of care   I have discontinued Ms. Vacca's aspirin, fluticasone, Diclofenac Sodium CR, and betamethasone valerate ointment. I am also having her maintain her Iron-Vitamins (GERITOL PO), Lutein, Black Cohosh, docusate sodium, lactulose, hydrOXYzine, CRANBERRY CONCENTRATE PO, clorazepate, traMADol, diazepam, and VITAMIN E PO.  Meds ordered this encounter  Medications  . VITAMIN E PO    Sig: Take by mouth.    Medications Discontinued During This Encounter  Medication Reason   . aspirin 81 MG tablet Error  . betamethasone valerate ointment (VALISONE) 0.1 % Error  . Diclofenac Sodium CR 100 MG 24 hr tablet Error  . fluticasone (FLONASE) 50 MCG/ACT nasal spray Error    Follow-up: No Follow-up on file.   Crecencio Mc, MD

## 2015-08-15 NOTE — Patient Instructions (Addendum)
Your pain is not coming from your hip.  I believe it is coming from your lumbar spine degenerative disease   For pain management, I want you  To take 1 Diclofenac daily , one tramadol every 6 hours,  And 1000 mg tylenol twice daily   You can also try adding the OTC SalonPas  Patch with lidocaine   .  You insurance will not cover the Lioderm patch  Referral to Dr Loistine Chance is advised    You do not have a UTI.  Your urinary frequency is comoing from the water that you drink

## 2015-08-17 NOTE — Assessment & Plan Note (Addendum)
She does not have glucosuria or  a UTI. Reviewed water intake and recommended changes to regimen.

## 2015-08-17 NOTE — Assessment & Plan Note (Addendum)
Reviewed all recent imaging studies with patient including recent Plain films of hip which were  normal..  Pain appears to be referred from lower back.  Trial of diclofenac, tramadol, Salon pas patch with lidocaine  and tylenol.

## 2015-08-23 ENCOUNTER — Telehealth: Payer: Self-pay

## 2015-08-23 MED ORDER — LACTULOSE 10 GM/15ML PO SOLN: ORAL | 2 refills | Status: DC

## 2015-08-23 NOTE — Telephone Encounter (Signed)
Patient would like to get refill on Lactulose solution, she forgot to ask on her recent visit. She uses this for constipation usually about once every 2 to 3 weeks. She needs this sent to [facility]. Last fill was  01/24/14 with 2 refills. Please review. Thank you-AA

## 2015-09-10 ENCOUNTER — Other Ambulatory Visit: Payer: Self-pay | Admitting: Internal Medicine

## 2015-09-10 NOTE — Telephone Encounter (Signed)
Last filled today. Pharmacy asking for future fills. Pt last seen 08/15/15.

## 2015-09-12 NOTE — Telephone Encounter (Signed)
authorized

## 2015-10-18 ENCOUNTER — Other Ambulatory Visit: Payer: Self-pay | Admitting: Internal Medicine

## 2015-10-21 ENCOUNTER — Telehealth: Payer: Self-pay | Admitting: *Deleted

## 2015-10-21 NOTE — Telephone Encounter (Signed)
Patient requested an appt with Dr.Tullo to discuss medications, please give a time and date to place pt. Pt [phone]

## 2015-10-21 NOTE — Telephone Encounter (Signed)
Next available. Unless patient is having problems if she is should be triaged.

## 2015-10-21 NOTE — Telephone Encounter (Signed)
Scheduled

## 2015-10-29 ENCOUNTER — Other Ambulatory Visit: Payer: Self-pay | Admitting: Internal Medicine

## 2015-10-29 NOTE — Telephone Encounter (Signed)
Pt called to follow up on both Rx. Please advise? Pharmacy states they don't have it. Thank you!  Call pt @ [phone].

## 2015-10-29 NOTE — Telephone Encounter (Signed)
Pt called back about the clorazepate (TRANXENE) 7.5 MG tablet which was not at the pharmacy . Pt would like a call back. Please advise?  Call pt @ [phone]. Thank you!

## 2015-10-29 NOTE — Telephone Encounter (Signed)
Last OV 8/17 ok to fill Tranxene

## 2015-10-29 NOTE — Telephone Encounter (Signed)
Refilled ,  #30 with 5 reflls

## 2015-11-01 ENCOUNTER — Other Ambulatory Visit: Payer: Self-pay | Admitting: Internal Medicine

## 2015-11-11 DIAGNOSIS — L821 Other seborrheic keratosis: Secondary | ICD-10-CM | POA: Diagnosis not present

## 2015-11-11 DIAGNOSIS — L570 Actinic keratosis: Secondary | ICD-10-CM | POA: Diagnosis not present

## 2015-12-04 ENCOUNTER — Ambulatory Visit (INDEPENDENT_AMBULATORY_CARE_PROVIDER_SITE_OTHER): Payer: Medicare Other | Admitting: Internal Medicine

## 2015-12-04 ENCOUNTER — Encounter: Payer: Self-pay | Admitting: Internal Medicine

## 2015-12-04 VITALS — BP 140/70 | HR 81 | Temp 98.0°F | Resp 12 | Ht 66.0 in | Wt 129.8 lb

## 2015-12-04 DIAGNOSIS — G250 Essential tremor: Secondary | ICD-10-CM | POA: Diagnosis not present

## 2015-12-04 DIAGNOSIS — F5104 Psychophysiologic insomnia: Secondary | ICD-10-CM

## 2015-12-04 DIAGNOSIS — Z13220 Encounter for screening for lipoid disorders: Secondary | ICD-10-CM | POA: Diagnosis not present

## 2015-12-04 DIAGNOSIS — R252 Cramp and spasm: Secondary | ICD-10-CM

## 2015-12-04 DIAGNOSIS — G4762 Sleep related leg cramps: Secondary | ICD-10-CM

## 2015-12-04 DIAGNOSIS — E559 Vitamin D deficiency, unspecified: Secondary | ICD-10-CM | POA: Diagnosis not present

## 2015-12-04 LAB — COMPREHENSIVE METABOLIC PANEL
ALT: 8 U/L (ref 0–35)
AST: 16 U/L (ref 0–37)
Albumin: 4.2 g/dL (ref 3.5–5.2)
Alkaline Phosphatase: 48 U/L (ref 39–117)
BUN: 11 mg/dL (ref 6–23)
CO2: 32 mEq/L (ref 19–32)
Calcium: 9.4 mg/dL (ref 8.4–10.5)
Chloride: 100 mEq/L (ref 96–112)
Creatinine, Ser: 0.8 mg/dL (ref 0.40–1.20)
GFR: 72.12 mL/min (ref >60.00)
Glucose, Bld: 89 mg/dL (ref 70–99)
Potassium: 5 mEq/L (ref 3.5–5.1)
Sodium: 136 mEq/L (ref 135–145)
Total Bilirubin: 0.4 mg/dL (ref 0.2–1.2)
Total Protein: 6.5 g/dL (ref 6.0–8.3)

## 2015-12-04 LAB — LIPID PANEL
Cholesterol: 229 mg/dL — ABNORMAL HIGH (ref 0–200)
HDL: 57.4 mg/dL (ref >39.00)
LDL Cholesterol: 140 mg/dL — ABNORMAL HIGH (ref 0–99)
NonHDL: 171.62
Total CHOL/HDL Ratio: 4
Triglycerides: 159 mg/dL — ABNORMAL HIGH (ref 0.0–149.0)
VLDL: 31.8 mg/dL (ref 0.0–40.0)

## 2015-12-04 LAB — VITAMIN D 25 HYDROXY (VIT D DEFICIENCY, FRACTURES): VITD: 31.88 ng/mL (ref 30.00–100.00)

## 2015-12-04 MED ORDER — PROPRANOLOL HCL ER 60 MG PO CP24: 60.0000 mg | ORAL_CAPSULE | Freq: Every day | ORAL | 2 refills | Status: DC

## 2015-12-04 NOTE — Patient Instructions (Addendum)
For your insomnia and nervousness :  Stop the morning valium.  Take 1 clorazepate in the morning ,  And take 1 valium in the evening,  Ok to repeat dose at 1 am if needed.   Try this for one week    Let  Me or Juliann Pulse know how  this is working   Try taking  A teaspoon of brown mustard at night to prevent muscle cramps   For your tremor:  we are starting Propranolol ER 60 mg one tablet daily for your tremor .

## 2015-12-04 NOTE — Progress Notes (Signed)
Subjective:  Patient ID: [name], female    DOB: 16-Sep-1926  Age: 82 y.o. MRN: [id number]  CC: The primary encounter diagnosis was Muscle cramp. Diagnoses of Vitamin D deficiency, Screening cholesterol level, Tremor, essential, Psychophysiological insomnia, and Nocturnal leg cramps were also pertinent to this visit.  HPI [name] presents for EVALUATION OF TREMOR , and management nocturnal mucle cramps  And insomnia    Insomnia:  She has early morning waking despite use of clorazepate.  Took  2 clorazepate at 8 pm  ,  Woke up at 1 am and could not go back to sleep.  Tried taking more  clorazepate a1 1 am and it made her insomnia worse.   Takes valium 5 mg in the am. For "nerves" but she denies true anxiety     New regimen outlined.    Concerned about constipation aggravating her left hip pain .   REFUSES FLU SHOT SINCE 1960  Wants to try inderal for management of essential tremor     Outpatient Medications Prior to Visit  Medication Sig Dispense Refill  . Black Cohosh (BLACK COHOSH HOT FLASH RELIEF) 40 MG CAPS Take 1 capsule by mouth as needed.     . clorazepate (TRANXENE) 7.5 MG tablet TAKE ONE TABLET BY MOUTH AT BEDTIME 30 tablet 3  . CRANBERRY CONCENTRATE PO Take by mouth. Reported on 05/27/2015    . docusate sodium (COLACE) 100 MG capsule Take 1 capsule (100 mg total) by mouth 2 (two) times daily. 60 capsule 11  . hydrOXYzine (ATARAX/VISTARIL) 25 MG tablet TAKE ONE (1) CAPSULE THREE (3) TIMES EACH DAY AS NEEDED 90 tablet 1  . Iron-Vitamins (GERITOL PO) Take by mouth.      . lactulose (CHRONULAC) 10 GM/15ML solution TAKE 30MLS(0NE OUNCE) BY MOUTH 2 TIMES DAILY AS NEEDED FOR MILD CONSTIPATION. DO NOT USE MORE THAN ONCE A WEEK 240 mL 2  . Lutein 20 MG CAPS Take 20 mg by mouth daily.      . traMADol (ULTRAM) 50 MG tablet TAKE ONE TABLET BY MOUTH EVERY 8 HOURS AS NEEDED 90 tablet 3  . VITAMIN E PO Take by mouth.    . diazepam (VALIUM) 5 MG tablet Take 1 tablet (5 mg  total) by mouth every 8 (eight) hours as needed for anxiety. 90 tablet 3   No facility-administered medications prior to visit.     Review of Systems;  Patient denies headache, fevers, malaise, unintentional weight loss, skin rash, eye pain, sinus congestion and sinus pain, sore throat, dysphagia,  hemoptysis , cough, dyspnea, wheezing, chest pain, palpitations, orthopnea, edema, abdominal pain, nausea, melena, diarrhea, , dysuria, hematuria, urinary  Frequency, nocturia, numbness, tingling, seizures,  Focal weakness, Loss of consciousness,, depression, anxiety, and suicidal ideation.      Objective:  BP 140/70   Pulse 81   Temp 98 F (36.7 C) (Oral)   Resp 12   Ht 5\' 6"  (1.676 m)   Wt 129 lb 12 oz (58.9 kg)   SpO2 96%   BMI 20.94 kg/m   BP Readings from Last 3 Encounters:  12/04/15 140/70  08/15/15 130/88  07/29/15 140/80    Wt Readings from Last 3 Encounters:  12/04/15 129 lb 12 oz (58.9 kg)  08/15/15 130 lb 2 oz (59 kg)  07/29/15 128 lb 2 oz (58.1 kg)    General appearance: alert, cooperative and appears stated age Ears: normal TM's and external ear canals both ears Throat: lips, mucosa, and tongue normal; teeth  and gums normal Neck: no adenopathy, no carotid bruit, supple, symmetrical, trachea midline and thyroid not enlarged, symmetric, no tenderness/mass/nodules Back: symmetric, no curvature. ROM normal. No CVA tenderness. Lungs: clear to auscultation bilaterally Heart: regular rate and rhythm, S1, S2 normal, no murmur, click, rub or gallop Abdomen: soft, non-tender; bowel sounds normal; no masses,  no organomegaly Pulses: 2+ and symmetric Skin: Skin color, texture, turgor normal. No rashes or lesions Lymph nodes: Cervical, supraclavicular, and axillary nodes normal.  No results found for: HGBA1C  Lab Results  Component Value Date   CREATININE 0.80 12/04/2015   CREATININE 0.79 01/04/2015   CREATININE 0.84 06/13/2014    Lab Results  Component Value Date     WBC 6.5 01/04/2015   HGB 12.3 01/04/2015   HCT 36.6 01/04/2015   PLT 255.0 01/04/2015   GLUCOSE 89 12/04/2015   CHOL 229 (H) 12/04/2015   TRIG 159.0 (H) 12/04/2015   HDL 57.40 12/04/2015   LDLDIRECT 145.6 12/21/2012   LDLCALC 140 (H) 12/04/2015   ALT 8 12/04/2015   AST 16 12/04/2015   NA 136 12/04/2015   K 5.0 12/04/2015   CL 100 12/04/2015   CREATININE 0.80 12/04/2015   BUN 11 12/04/2015   CO2 32 12/04/2015   TSH 1.92 01/04/2015    No results found.  Assessment & Plan:   Problem List Items Addressed This Visit    Insomnia    New regimen outlined.  Use valium at night ,  Clorazepate in the am.       Nocturnal leg cramps    Recommended trial of valium at bedtime for insomnia , and  Turmeric.       Tremor, essential    Trial of Inderal LA       Other Visit Diagnoses    Muscle cramp    -  Primary   Relevant Orders   Comprehensive metabolic panel (Completed)   Vitamin D deficiency       Relevant Orders   VITAMIN D 25 Hydroxy (Vit-D Deficiency, Fractures) (Completed)   Screening cholesterol level       Relevant Orders   Lipid panel (Completed)      I am having [name] start on propranolol ER. I am also having her maintain her Iron-Vitamins (GERITOL PO), Lutein, Black Cohosh, docusate sodium, CRANBERRY CONCENTRATE PO, VITAMIN E PO, lactulose, traMADol, hydrOXYzine, and clorazepate.  Meds ordered this encounter  Medications  . propranolol ER (INDERAL LA) 60 MG 24 hr capsule    Sig: Take 1 capsule (60 mg total) by mouth at bedtime.    Dispense:  30 capsule    Refill:  2    There are no discontinued medications.  Follow-up: No Follow-up on file.   Crecencio Mc, MD

## 2015-12-04 NOTE — Progress Notes (Signed)
Pre-visit discussion using our clinic review tool. No additional management support is needed unless otherwise documented below in the visit note.  

## 2015-12-05 ENCOUNTER — Other Ambulatory Visit: Payer: Self-pay | Admitting: Internal Medicine

## 2015-12-05 NOTE — Telephone Encounter (Signed)
Last filled 09/10/15. Last OV 12/04/15.

## 2015-12-07 DIAGNOSIS — G250 Essential tremor: Secondary | ICD-10-CM | POA: Insufficient documentation

## 2015-12-07 NOTE — Assessment & Plan Note (Addendum)
Recommended trial of valium at bedtime for insomnia , and  Turmeric.

## 2015-12-07 NOTE — Assessment & Plan Note (Signed)
New regimen outlined.  Use valium at night ,  Clorazepate in the am.

## 2015-12-07 NOTE — Assessment & Plan Note (Signed)
Trial of Inderal LA

## 2015-12-09 ENCOUNTER — Telehealth: Payer: Self-pay | Admitting: Internal Medicine

## 2015-12-09 NOTE — Telephone Encounter (Signed)
The patient called stating that the new medication(Inderal) is working fine and she is needing a refill called into the pharmacy for her Valium . I see that a new prescription was printed on 11.30.17 I don't see it in the folder out front.

## 2015-12-10 ENCOUNTER — Encounter: Payer: Self-pay | Admitting: *Deleted

## 2015-12-10 NOTE — Telephone Encounter (Signed)
Notified patient script was faxed on 12/05/15.

## 2016-02-11 DIAGNOSIS — D0471 Carcinoma in situ of skin of right lower limb, including hip: Secondary | ICD-10-CM | POA: Diagnosis not present

## 2016-03-30 DIAGNOSIS — M5416 Radiculopathy, lumbar region: Secondary | ICD-10-CM | POA: Diagnosis not present

## 2016-03-30 DIAGNOSIS — M62830 Muscle spasm of back: Secondary | ICD-10-CM | POA: Diagnosis not present

## 2016-03-30 DIAGNOSIS — M5136 Other intervertebral disc degeneration, lumbar region: Secondary | ICD-10-CM | POA: Diagnosis not present

## 2016-04-06 ENCOUNTER — Other Ambulatory Visit: Payer: Self-pay | Admitting: Internal Medicine

## 2016-04-06 ENCOUNTER — Telehealth: Payer: Self-pay | Admitting: Internal Medicine

## 2016-04-06 NOTE — Telephone Encounter (Signed)
Left pt message asking to call Allison back directly at [phone] to schedule AWV. Thanks! °

## 2016-05-07 NOTE — Telephone Encounter (Signed)
Attempted to reach pt a 2nd time. No answer, no vm. Just kept ringing.

## 2016-05-15 ENCOUNTER — Other Ambulatory Visit: Payer: Self-pay | Admitting: Internal Medicine

## 2016-05-15 NOTE — Telephone Encounter (Signed)
Refilled: 09/12/2015 Last OV: 12/04/2015 Next OV: 06/16/2016

## 2016-05-18 ENCOUNTER — Other Ambulatory Visit: Payer: Self-pay | Admitting: Internal Medicine

## 2016-05-18 NOTE — Telephone Encounter (Signed)
Printed, signed and faxed on 05/15/2016.

## 2016-05-18 NOTE — Telephone Encounter (Signed)
Pt called and stated that propranolol ER (INDERAL LA) 60 MG 24 hr capsule has helped tremendously and she needs a refill. Please advise, thank you!  [place], [place], [place]  Call pt @ [phone]

## 2016-05-19 NOTE — Telephone Encounter (Addendum)
Tried calling patient no answer.   [place] they have new script and will go ahead and fill script for propanolol.

## 2016-05-20 NOTE — Telephone Encounter (Signed)
Spoke with pt and she stated that she picked up the rx yesterday.  

## 2016-06-08 ENCOUNTER — Telehealth: Payer: Self-pay | Admitting: Radiology

## 2016-06-08 DIAGNOSIS — E871 Hypo-osmolality and hyponatremia: Secondary | ICD-10-CM

## 2016-06-08 DIAGNOSIS — E785 Hyperlipidemia, unspecified: Secondary | ICD-10-CM

## 2016-06-08 NOTE — Telephone Encounter (Signed)
Pt coming in for labs tomorrow, please place future orders. Thank you.  

## 2016-06-08 NOTE — Addendum Note (Signed)
Addended by: Crecencio Mc on: 06/08/2016 09:39 AM   Modules accepted: Orders

## 2016-06-09 ENCOUNTER — Other Ambulatory Visit: Payer: Medicare Other

## 2016-06-09 ENCOUNTER — Other Ambulatory Visit (INDEPENDENT_AMBULATORY_CARE_PROVIDER_SITE_OTHER): Payer: Medicare Other

## 2016-06-09 DIAGNOSIS — E785 Hyperlipidemia, unspecified: Secondary | ICD-10-CM | POA: Diagnosis not present

## 2016-06-09 DIAGNOSIS — E871 Hypo-osmolality and hyponatremia: Secondary | ICD-10-CM

## 2016-06-09 LAB — COMPREHENSIVE METABOLIC PANEL
ALT: 8 U/L (ref 0–35)
AST: 18 U/L (ref 0–37)
Albumin: 4.2 g/dL (ref 3.5–5.2)
Alkaline Phosphatase: 40 U/L (ref 39–117)
BUN: 10 mg/dL (ref 6–23)
CO2: 31 mEq/L (ref 19–32)
Calcium: 9.6 mg/dL (ref 8.4–10.5)
Chloride: 99 mEq/L (ref 96–112)
Creatinine, Ser: 0.81 mg/dL (ref 0.40–1.20)
GFR: 71.01 mL/min (ref >60.00)
Glucose, Bld: 96 mg/dL (ref 70–99)
Potassium: 4.2 mEq/L (ref 3.5–5.1)
Sodium: 134 mEq/L — ABNORMAL LOW (ref 135–145)
Total Bilirubin: 0.8 mg/dL (ref 0.2–1.2)
Total Protein: 7.1 g/dL (ref 6.0–8.3)

## 2016-06-09 LAB — LIPID PANEL
Cholesterol: 254 mg/dL — ABNORMAL HIGH (ref 0–200)
HDL: 60 mg/dL (ref >39.00)
LDL Cholesterol: 174 mg/dL — ABNORMAL HIGH (ref 0–99)
NonHDL: 194.44
Total CHOL/HDL Ratio: 4
Triglycerides: 103 mg/dL (ref 0.0–149.0)
VLDL: 20.6 mg/dL (ref 0.0–40.0)

## 2016-06-16 ENCOUNTER — Telehealth: Payer: Self-pay | Admitting: *Deleted

## 2016-06-16 ENCOUNTER — Other Ambulatory Visit: Payer: Self-pay

## 2016-06-16 ENCOUNTER — Encounter: Payer: Self-pay | Admitting: Internal Medicine

## 2016-06-16 ENCOUNTER — Ambulatory Visit (INDEPENDENT_AMBULATORY_CARE_PROVIDER_SITE_OTHER): Payer: Medicare Other | Admitting: Internal Medicine

## 2016-06-16 VITALS — BP 150/82 | HR 52 | Temp 97.6°F | Resp 15 | Ht 66.0 in | Wt 130.8 lb

## 2016-06-16 DIAGNOSIS — G8929 Other chronic pain: Secondary | ICD-10-CM | POA: Diagnosis not present

## 2016-06-16 DIAGNOSIS — M533 Sacrococcygeal disorders, not elsewhere classified: Secondary | ICD-10-CM

## 2016-06-16 DIAGNOSIS — K5900 Constipation, unspecified: Secondary | ICD-10-CM | POA: Diagnosis not present

## 2016-06-16 DIAGNOSIS — R030 Elevated blood-pressure reading, without diagnosis of hypertension: Secondary | ICD-10-CM | POA: Diagnosis not present

## 2016-06-16 DIAGNOSIS — R300 Dysuria: Secondary | ICD-10-CM

## 2016-06-16 DIAGNOSIS — M25552 Pain in left hip: Secondary | ICD-10-CM | POA: Diagnosis not present

## 2016-06-16 DIAGNOSIS — R39198 Other difficulties with micturition: Secondary | ICD-10-CM

## 2016-06-16 DIAGNOSIS — F5104 Psychophysiologic insomnia: Secondary | ICD-10-CM | POA: Diagnosis not present

## 2016-06-16 DIAGNOSIS — G250 Essential tremor: Secondary | ICD-10-CM | POA: Diagnosis not present

## 2016-06-16 LAB — POCT URINALYSIS DIPSTICK
Bilirubin, UA: NEGATIVE
Blood, UA: NEGATIVE
Glucose, UA: NEGATIVE
Ketones, UA: NEGATIVE
Leukocytes, UA: NEGATIVE
Nitrite, UA: NEGATIVE
Protein, UA: NEGATIVE
Spec Grav, UA: 1.015 (ref 1.010–1.025)
Urobilinogen, UA: 0.2 E.U./dL
pH, UA: 7 (ref 5.0–8.0)

## 2016-06-16 LAB — URINALYSIS, MICROSCOPIC ONLY

## 2016-06-16 MED ORDER — CHLORDIAZEPOXIDE HCL 5 MG PO CAPS: 5.0000 mg | ORAL_CAPSULE | Freq: Every day | ORAL | 0 refills | Status: DC

## 2016-06-16 NOTE — Assessment & Plan Note (Addendum)
Referring to PT for TENS Unit .  Continue tylenol and tramadol.  Urged to consider cymbalta

## 2016-06-16 NOTE — Patient Instructions (Addendum)
You have AGREED to stop taking the valium and the clorazepate if you start the librium .  You may use it only once daily   I will wait for your response   You can use the hydroxyzine up to 3 times per day if needed for itching or for your nerves.   I have ordered Physical therapy so the TENS Unit can be applied to your lower back and hip for pain relief    Please consider an antidepressant that helps to manage pain called Cymbalta.     Your blood pressure is HIGH  HERE  TODAY  Please return tomorrow WITH YOUR HOME BLOOD PRESSURE MACHINE FOR A 4:00 APPT  WITH THE NURSE

## 2016-06-16 NOTE — Assessment & Plan Note (Signed)
reigmen clarified.

## 2016-06-16 NOTE — Assessment & Plan Note (Addendum)
She has no history of hypertension but has had several elevated readings.  She has been asked to return for an RN visit to check her pressures using her home monitor and  In house readings Lab Results  Component Value Date   CREATININE 0.81 06/09/2016   Lab Results  Component Value Date   NA 134 (L) 06/09/2016   K 4.2 06/09/2016   CL 99 06/09/2016   CO2 31 06/09/2016

## 2016-06-16 NOTE — Telephone Encounter (Signed)
Patient stated that she was given a Rx for Librium, this mediation is no longer being made just the generic. Patient stated the generic did not work for her, and that she left the script at the pharmacy to be destroyed. Pt contact [phone]

## 2016-06-16 NOTE — Telephone Encounter (Signed)
Please advise 

## 2016-06-16 NOTE — Assessment & Plan Note (Signed)
IMPROVED WITH iNDERAL LA   No changes today

## 2016-06-16 NOTE — Progress Notes (Signed)
Subjective:  Patient ID: [name], female    DOB: October 02, 1926  Age: 83 y.o. MRN: [id number]  CC: The primary encounter diagnosis was Back pain, sacroiliac. Diagnoses of Dysuria, Benign essential tremor, Chronic left hip pain, Constipation, unspecified constipation type, Psychophysiological insomnia, and Elevated blood pressure reading in office without diagnosis of hypertension were also pertinent to this visit.  HPI [name] presents for follow up on chronic conditions  Including insomnia , anxiety,  Benign essential tremor and chronic lefgt sided back pain . \\      At last visit in January her chronic use of benzodiazepines  Was addressed again and initiation of inderal for tremors was advised . Benzo Regimen was changed to valium at night to mange concurrent leg cramps, along with turmeric.  And  Clorazepate in the morning. .  She did not change the CHLORAZEPATE to morning,  And has been taking it at night along with the valium 5 mg. She states that she is not sleeping well and having bad dreams.  Her back pain is constant and has been aggravated by her housework and yardwork. She  was given #5 percocet in April  by Dr Loistine Chance for pain not relieved with tylenol and tramadol and a left lateral superior gluteal trigger point injection   Today she states that her back pain is worse.  The oxycodone did not help. She denies midline back pain and localizes the pain tho her left iliac crest .  It is aggravated by constipation as well.    High cholesterol , untreated currently.   Long history of high lipids, . But did not tolerate statins in the past .  Has  Tried dietary modifications   Bowels moving if she takes 2 stool softeners daily  and adds senna .  Then adds lactulose if needed averages 1 / month   Using hydroxyine for hives/itchiing  for the past 3 weeks. Happens every summer.   Long discussion about anxiety vs "nerves"  As she calls them. Took librium for many many (>30)  years to manage menopause,  Trouble concentrating .  Wants to resume name brND LIBRIUM.  TREMOR IS BETTER WITH INDERAL LA. hANDS NO LONGER SHAKING,  CAN WRITE CHECKS  Outpatient Medications Prior to Visit  Medication Sig Dispense Refill  . docusate sodium (COLACE) 100 MG capsule Take 1 capsule (100 mg total) by mouth 2 (two) times daily. 60 capsule 11  . hydrOXYzine (ATARAX/VISTARIL) 25 MG tablet TAKE ONE (1) CAPSULE THREE (3) TIMES EACH DAY AS NEEDED 90 tablet 1  . lactulose (CHRONULAC) 10 GM/15ML solution TAKE 30MLS(0NE OUNCE) BY MOUTH 2 TIMES DAILY AS NEEDED FOR MILD CONSTIPATION. DO NOT USE MORE THAN ONCE A WEEK 240 mL 2  . Lutein 20 MG CAPS Take 20 mg by mouth daily.      . propranolol ER (INDERAL LA) 60 MG 24 hr capsule TAKE ONE CAPSULE BY MOUTH AT BEDTIME 30 capsule 3  . traMADol (ULTRAM) 50 MG tablet TAKE ONE TABLET BY MOUTH EVERY 8 HOURS AS NEEDED 90 tablet 0  . VITAMIN E PO Take by mouth.    . clorazepate (TRANXENE) 7.5 MG tablet TAKE ONE TABLET BY MOUTH AT BEDTIME 30 tablet 3  . diazepam (VALIUM) 5 MG tablet TAKE ONE TABLET BY MOUTH EVERY 8 HOURS AS NEEDED FOR ANXIETY 90 tablet 3  . Black Cohosh (BLACK COHOSH HOT FLASH RELIEF) 40 MG CAPS Take 1 capsule by mouth as needed.     [name]  CRANBERRY CONCENTRATE PO Take by mouth. Reported on 05/27/2015    . Iron-Vitamins (GERITOL PO) Take by mouth.       No facility-administered medications prior to visit.     Review of Systems;  Patient denies headache, fevers, malaise, unintentional weight loss, skin rash, eye pain, sinus congestion and sinus pain, sore throat, dysphagia,  hemoptysis , cough, dyspnea, wheezing, chest pain, palpitations, orthopnea, edema, abdominal pain, nausea, melena, diarrhea, constipation, flank pain, dysuria, hematuria, urinary  Frequency, nocturia, numbness, tingling, seizures,  Focal weakness, Loss of consciousness,   depression, anxiety, and suicidal ideation.      Objective:  BP (!) 150/82 (BP Location: Left Arm,  Patient Position: Sitting, Cuff Size: Normal)   Pulse (!) 52   Temp 97.6 F (36.4 C) (Oral)   Resp 15   Ht 5\' 6"  (1.676 m)   Wt 130 lb 12.8 oz (59.3 kg)   SpO2 97%   BMI 21.11 kg/m   BP Readings from Last 3 Encounters:  06/16/16 (!) 150/82  12/04/15 140/70  08/15/15 130/88    Wt Readings from Last 3 Encounters:  06/16/16 130 lb 12.8 oz (59.3 kg)  12/04/15 129 lb 12 oz (58.9 kg)  08/15/15 130 lb 2 oz (59 kg)    General appearance: alert, cooperative and appears stated age Ears: normal TM's and external ear canals both ears Throat: lips, mucosa, and tongue normal; teeth and gums normal Neck: no adenopathy, no carotid bruit, supple, symmetrical, trachea midline and thyroid not enlarged, symmetric, no tenderness/mass/nodules Back: symmetric, no curvature. ROM normal. No CVA tenderness. Lungs: clear to auscultation bilaterally Heart: regular rate and rhythm, S1, S2 normal, no murmur, click, rub or gallop Abdomen: soft, non-tender; bowel sounds normal; no masses,  no organomegaly Pulses: 2+ and symmetric Skin: Skin color, texture, turgor normal. No rashes or lesions Lymph nodes: Cervical, supraclavicular, and axillary nodes normal.  No results found for: HGBA1C  Lab Results  Component Value Date   CREATININE 0.81 06/09/2016   CREATININE 0.80 12/04/2015   CREATININE 0.79 01/04/2015    Lab Results  Component Value Date   WBC 6.5 01/04/2015   HGB 12.3 01/04/2015   HCT 36.6 01/04/2015   PLT 255.0 01/04/2015   GLUCOSE 96 06/09/2016   CHOL 254 (H) 06/09/2016   TRIG 103.0 06/09/2016   HDL 60.00 06/09/2016   LDLDIRECT 145.6 12/21/2012   LDLCALC 174 (H) 06/09/2016   ALT 8 06/09/2016   AST 18 06/09/2016   NA 134 (L) 06/09/2016   K 4.2 06/09/2016   CL 99 06/09/2016   CREATININE 0.81 06/09/2016   BUN 10 06/09/2016   CO2 31 06/09/2016   TSH 1.92 01/04/2015    No results found.  Assessment & Plan:   Problem List Items Addressed This Visit    Insomnia    Continue  valium up to 1.5 tablets at night.  Dc clorazepate      Elevated blood pressure reading in office without diagnosis of hypertension    She has no history of hypertension but has had several elevated readings.  She has been asked to return for an RN visit to check her pressures using her home monitor and  In house readings Lab Results  Component Value Date   CREATININE 0.81 06/09/2016   Lab Results  Component Value Date   NA 134 (L) 06/09/2016   K 4.2 06/09/2016   CL 99 06/09/2016   CO2 31 06/09/2016         Constipation  reigmen clarified.       Chronic left hip pain    Referring to PT for TENS Unit .  Continue tylenol and tramadol.  Urged to consider cymbalta      Benign essential tremor    IMPROVED WITH iNDERAL LA   No changes today       Back pain, sacroiliac - Primary   Relevant Orders   Ambulatory referral to Physical Therapy    Other Visit Diagnoses    Dysuria       Relevant Orders   Urine Culture   Urine Microscopic Only (Completed)   POCT urinalysis dipstick (Completed)      I have discontinued Ms. Whittier's Black Cohosh, CRANBERRY CONCENTRATE PO, diazepam, clorazepate, and chlordiazePOXIDE. I am also having her maintain her Lutein, docusate sodium, VITAMIN E PO, lactulose, hydrOXYzine, propranolol ER, traMADol, Garlic Oil, magnesium oxide, Potassium Gluconate, and Iron-Vitamins (GERITOL PO).  Meds ordered this encounter  Medications  . Garlic Oil 2 MG CAPS    Sig: Take by mouth.  . magnesium oxide (MAG-OX) 400 MG tablet    Sig: Take 400 mg by mouth.  . Potassium Gluconate 2 MEQ TABS    Sig: Take by mouth.  . Iron-Vitamins (GERITOL PO)    Sig: Take 1 tablet by mouth daily.  [name] DISCONTD: chlordiazePOXIDE (LIBRIUM) 5 MG capsule    Sig: Take 1 capsule (5 mg total) by mouth daily. As needed for anxiety    Dispense:  30 capsule    Refill:  0    Medications Discontinued During This Encounter  Medication Reason  . Black Cohosh (BLACK COHOSH HOT FLASH  RELIEF) 40 MG CAPS Patient has not taken in last 30 days  . CRANBERRY CONCENTRATE PO Patient has not taken in last 30 days  . Iron-Vitamins (GERITOL PO) Patient has not taken in last 30 days  . clorazepate (TRANXENE) 7.5 MG tablet   . diazepam (VALIUM) 5 MG tablet   . chlordiazePOXIDE (LIBRIUM) 5 MG capsule     Follow-up: Return in about 4 weeks (around 07/14/2016), or MED CHECK,  RN VISIT TOMORROW 4:00.   Crecencio Mc, MD

## 2016-06-16 NOTE — Assessment & Plan Note (Signed)
Continue valium up to 1.5 tablets at night.  Dc clorazepate

## 2016-06-17 ENCOUNTER — Ambulatory Visit (INDEPENDENT_AMBULATORY_CARE_PROVIDER_SITE_OTHER): Payer: Medicare Other

## 2016-06-17 VITALS — BP 172/82 | HR 64 | Resp 20

## 2016-06-17 DIAGNOSIS — R030 Elevated blood-pressure reading, without diagnosis of hypertension: Secondary | ICD-10-CM

## 2016-06-17 LAB — URINE CULTURE

## 2016-06-17 NOTE — Progress Notes (Signed)
Patient came in for a BP check, was advised during OV yesterday to bring her home cuff and readings to the visit today.  She brought 2 different cuffs, a gray and blue and a list of readings to review.  Checked BP with each home cuff, as follows Left arm sitting, 144/98 HR 67  Gray cuff Left arm sitting, 182/93 HR 63  Blue cuff  The blue cuff is newer (within the past year.)  Patient did tell me that she is overwhelmed at this point with life events.  Yesterday prior to her appt she was washing windows and she was not able to close one and had to call for assistance and that almost made her late for her appt.  She is also waiting on a person to come out to service her well, and all of this is not normal for her.  She hasn't felt the best according to her today, just doesn't want to do anything.  Please advise, thanks  I have put the readings in your folder for review

## 2016-06-18 MED ORDER — AMLODIPINE BESYLATE 2.5 MG PO TABS
2.5000 mg | ORAL_TABLET | Freq: Every day | ORAL | 0 refills | Status: DC
Start: 2016-06-18 — End: 2016-10-13

## 2016-06-18 NOTE — Addendum Note (Signed)
Addended by: Crecencio Mc on: 06/18/2016 01:49 PM   Modules accepted: Orders

## 2016-06-18 NOTE — Progress Notes (Signed)
The blue cuff is the accurate one . I recommend starting amlodipine 2.5 mg daily for blood pressure   Regards,   Deborra Medina, MD

## 2016-06-19 NOTE — Progress Notes (Signed)
Spoke with Mrs. Gomm, expressed using the blue cuff and reviewed your thoughts on adding amlodipine and she declines to take at this time.  She believes that her BP is up due to all that is going on with her well at the house.  She wants to wait until August to determine if she needs additional medication for her BP.

## 2016-06-30 ENCOUNTER — Other Ambulatory Visit: Payer: Self-pay | Admitting: Internal Medicine

## 2016-07-02 ENCOUNTER — Telehealth: Payer: Self-pay | Admitting: Internal Medicine

## 2016-07-02 ENCOUNTER — Ambulatory Visit: Payer: Medicare Other | Attending: Internal Medicine

## 2016-07-02 VITALS — BP 140/90

## 2016-07-02 DIAGNOSIS — M545 Low back pain: Secondary | ICD-10-CM | POA: Insufficient documentation

## 2016-07-02 DIAGNOSIS — M25552 Pain in left hip: Secondary | ICD-10-CM

## 2016-07-02 DIAGNOSIS — G8929 Other chronic pain: Secondary | ICD-10-CM

## 2016-07-02 NOTE — Therapy (Signed)
[facility] [address] [place], [address] Phone: [phone]   Fax:  [phone]  Physical Therapy Evaluation  Patient Details  Name: [name] MRN: [id number] Date of Birth: 07-04-1926 Referring Provider: Deborra Medina, MD  Encounter Date: 07/02/2016      PT End of Session - 07/02/16 0932    Visit Number 1   Number of Visits 13   Date for PT Re-Evaluation 08/13/16   Authorization Type 1   Authorization Time Period of 10 g code   PT Start Time 0932   PT Stop Time 1039   PT Time Calculation (min) 67 min   Activity Tolerance Patient tolerated treatment well   Behavior During Therapy [facility] for tasks assessed/performed      Past Medical History:  Diagnosis Date  . Anxiety   . Back pain   . Chronic orthostatic hypotension June 2012   with pprior syncopal event,  did not tolelrate florinef trial  . Depression   . Hyperlipidemia   . Hypertension     Past Surgical History:  Procedure Laterality Date  . ANKLE SURGERY Left   . CATARACT EXTRACTION, BILATERAL    . WRIST SURGERY Right     Vitals:   07/02/16 0936  BP: 140/90         Subjective Assessment - 07/02/16 0936    Subjective L hip pain: 0/10 currently (pt sitting),  10/10 at most for the past month   Pertinent History Back pain, sacroiliac.  Pt states having back problems for a long long time. Pt stopped eating in 2015 May 28, 2013) after her husband died. Got constipated, and when she used the bathroom, pt strained to get her bowel to move. She heard and felt something pop in her L hip area. Was checked for her low back before and it feels fine. Her pain pain is at her L lateral hip area.  Had an MRI for the back which showed a crack in the top vertebra, which one of her doctors feel like it might have been that pop she heard. Had an injection for her back which did not help.  Denies loss of bladder and bowel control. MD knows about her  constipation.   Denies LE paresthesia.  Pt is also an active person and works regularly in the yard planting flowers and pulling weeds.  Does not sit on a stool.  Had PT for her back before which helped her back but not her L hip.    Patient Stated Goals Get rid of that pain, walk a little further, keep her side from hurting so she can walk a little more straight and not give in to it.    Currently in Pain? Yes   Pain Score 0-No pain   Pain Location Hip   Pain Orientation Left;Lateral   Pain Descriptors / Indicators Aching   Pain Type Chronic pain   Pain Onset More than a month ago   Pain Frequency Occasional   Aggravating Factors  Walking about 15 ft, standing such as when she is cooking supper and washing dishes, mopping the kitchen floor   Pain Relieving Factors supine hooklying position > R or L S/L position            [facility] PT Assessment - 07/02/16 0951      Assessment   Medical Diagnosis back, sacroiliac pain   Referring Provider Deborra Medina, MD   Onset Date/Surgical Date 06/16/16  Date  PT referral signed. Chronic condition.   Prior Therapy pt states having previous PT for her low back which helped her low back but not her L hip     Precautions   Precaution Comments No known precautions     Restrictions   Other Position/Activity Restrictions No known restrictions     Balance Screen   Has the patient fallen in the past 6 months No   Has the patient had a decrease in activity level because of a fear of falling?  No   Is the patient reluctant to leave their home because of a fear of falling?  No     Home Environment   Additional Comments Pt lives in a 1 story home alone.  2 steps to enter, no rail.      Prior Function   Vocation Retired   Biomedical scientist PLOF: better able to ambulate longer distances, perform chores at home, cook with less L hip and low back pain     Observation/Other Assessments   Observations (+) long sit test suggesting anterior nutation of  L innominate.   Bilateral femoral adduction and IR with sit <> stand transfers from regular chair height   Modified Oswertry 34%     Posture/Postural Control   Posture Comments R lateral shift, protracted head, R lumbar side bend, L lumbar rotation, L iliac crest lower     AROM   Overall AROM Comments No reproduction of symptoms with lumbar AROM   Lumbar Flexion full   Lumbar Extension limited   Lumbar - Right Side Bend full   Lumbar - Left Side Bend WFL   Lumbar - Right Rotation WFL   Lumbar - Left Rotation [facility]     Strength   Right Hip Flexion 4/5   Right Hip ABduction 4/5   Left Hip Flexion 4-/5  with reproduction of L posterior LBP/hip pain; lumbar flexio   Left Hip ABduction 4-/5   Right Knee Flexion 4+/5   Right Knee Extension 4+/5   Left Knee Flexion 4/5   Left Knee Extension 4+/5     Palpation   Palpation comment TTP L posterior hip. Anterior nutation L innominate     Ambulation/Gait   Gait Comments R side bend with R deviation at times. Slight decreased stance R LE, forward flexed.             Objective measurements completed on examination: See above findings.     Putting on panty hose on L side (seated L hip flexion), and seated L knee extension  increased L low back/posterior hip pain   No L posterior low back and posterior hip pulling sensation with seated L knee extension when lumbar area was in flexed position  More L posterior low back and hip pain with resisted hip flexion in lumbar flexion position   relieving factor: supine hooklying > R or L S/L position     There-ex  Seated L hip extension isometrics 10x3 with 5 second holds  Seated bilateral scapular retraction 10x5 seconds  Reviewed HEP. Pt demonstrated and verbalized understanding.     Improved exercise technique, movement at target joints, use of target muscles after mod verbal, visual, tactile cues.         PT Education - 07/02/16 2045    Education provided Yes   Education  Details ther-ex, HEP, plan of care   Person(s) Educated Patient   Methods Explanation;Demonstration;Tactile cues;Verbal cues;Handout   Comprehension Returned demonstration;Verbalized understanding  PT Long Term Goals - 07/02/16 1156      PT LONG TERM GOAL #1   Title Pt will have a decrease in L hip/low back pain to 5/10 or less at worst to promote ability to amblulate longer distances, perform chores at home.    Baseline 10/10 L low back and hip pain at worst for the past month (07/02/2016)   Time 6   Period Weeks   Status New     PT LONG TERM GOAL #2   Title Pt will improve L LE strength by at least 1/2 MMT strength grade to promote ability to ambulate longer distances and perform chores at home with less pain.    Time 6   Period Weeks   Status New     PT LONG TERM GOAL #3   Title Pt will improve her Modified Oswestry Low Back Pain Disability Questionnaire by at least 12% as a demonstration of improved function.    Baseline 34% (07/02/2016)   Time 6   Period Weeks   Status New                Plan - 07/02/16 1138    Clinical Impression Statement Patient is an 83 year old female who came to PT secondary to L hip and low back/sacral pain. She also presents with altered gait pattern and posture, decreased bilateral femoral control, TTP L posterior hip, reproduction of symptoms with seated hip flexion; L LE weakness compared to the R, and difficulty performing functional tasks such as walking longer distances, and performing standing activities such as cooking and mopping her floor. Pt will benefit from skilled physical therapy services to address the aforementioned deficits.    History and Personal Factors relevant to plan of care: Chronicity of pain and posture. L LE weakness. Age. Pt lives alone   Clinical Presentation Evolving   Clinical Presentation due to: L hip pain seems to be getting worse based on pt interview; difficulty walking longer distances,  performing standing tasks such as cooking, chores, gardening. Difficulty donning and doffing socks/pantyhose on L side   Clinical Decision Making Moderate   Rehab Potential Fair   Clinical Impairments Affecting Rehab Potential Chronicity of pain and posture. L LE weakness. Age   PT Frequency 2x / week   PT Duration 6 weeks   PT Treatment/Interventions Manual techniques;Therapeutic exercise;Therapeutic activities;Aquatic Therapy;Electrical Stimulation;Iontophoresis 4mg /ml Dexamethasone;Ultrasound;Neuromuscular re-education;Patient/family education;Dry needling   PT Next Visit Plan hip and scapular strengthening, posture, femoral control   Consulted and Agree with Plan of Care Patient      Patient will benefit from skilled therapeutic intervention in order to improve the following deficits and impairments:  Pain, Postural dysfunction, Improper body mechanics, Difficulty walking, Decreased strength  Visit Diagnosis: Chronic left-sided low back pain, with sciatica presence unspecified - Plan: PT plan of care cert/re-cert  Pain in left hip - Plan: PT plan of care cert/re-cert      G-Codes - 28/31/51 April 21, 1207    Functional Assessment Tool Used (Outpatient Only) Modified Oswestry Low Back Pain Disabilty Questionnaire, clinical presentation, patient interview   Functional Limitation Mobility: Walking and moving around   Mobility: Walking and Moving Around Current Status (V6160) At least 20 percent but less than 40 percent impaired, limited or restricted   Mobility: Walking and Moving Around Goal Status [phone]) At least 1 percent but less than 20 percent impaired, limited or restricted       Problem List Patient Active Problem List   Diagnosis Date Noted  .  Iliac bone pain 07/29/2015  . Osteoporosis 03/13/2015  . Raynaud phenomenon 02/24/2015  . Melancholia 02/09/2015  . Hyponatremia 02/09/2015  . Increased urinary frequency 01/30/2015  . Medicare annual wellness visit, subsequent  01/06/2015  . Encounter for preventive health examination 01/06/2015  . Underweight 06/15/2014  . Chronic left hip pain 06/13/2014  . Benign essential tremor 10/02/2013  . Constipation 08/12/2013  . Lethargy 07/22/2013  . Urinary incontinence, urge 05/29/2012  . Nocturnal leg cramps 12/15/2011  . Screening for breast cancer 10/27/2011  . Degeneration of lumbar or lumbosacral intervertebral disc 09/01/2011  . Insomnia 01/07/2011  . Hyperlipidemia LDL goal <130 11/08/2010  . Back pain, sacroiliac 11/08/2010  . Elevated blood pressure reading in office without diagnosis of hypertension 09/18/2010     Joneen Boers PT, DPT   07/02/2016, 8:57 PM  [facility] [address] [place], [address] Phone: [phone]   Fax:  [phone]  Name: [name] MRN: [id number] Date of Birth: 04/07/1926

## 2016-07-02 NOTE — Telephone Encounter (Signed)
Pt called wanting to know is the 1 time a day morning or night? For medication amLODipine (NORVASC) 2.5 MG tablet.  Pharmacy is [facility], [place], [place].  Call pt @ [phone].

## 2016-07-02 NOTE — Patient Instructions (Signed)
      Scapular Retraction    Perform in sitting or standing With arms at sides, pinch shoulder blades together. Hold for 5 seconds. Repeat __10__ times per set. Do __3__ sets per session.     Copyright  VHI. All rights reserved.

## 2016-07-02 NOTE — Telephone Encounter (Signed)
Spoke with pt and informed her that she can take it either in the morning or at night she just needs to make sure that she takes it at the same time every day. Pt gave a verbal understanding.

## 2016-07-03 DIAGNOSIS — H43813 Vitreous degeneration, bilateral: Secondary | ICD-10-CM | POA: Diagnosis not present

## 2016-07-07 ENCOUNTER — Ambulatory Visit: Payer: Medicare Other | Attending: Internal Medicine

## 2016-07-07 DIAGNOSIS — M25552 Pain in left hip: Secondary | ICD-10-CM | POA: Diagnosis not present

## 2016-07-07 DIAGNOSIS — M545 Low back pain: Secondary | ICD-10-CM | POA: Insufficient documentation

## 2016-07-07 DIAGNOSIS — G8929 Other chronic pain: Secondary | ICD-10-CM

## 2016-07-07 NOTE — Patient Instructions (Signed)
  Sitting on a chair (chair back against the wall),    Squeeze your rear end muscles and press your left foot comfortably onto the floor.    Count out loud for 5 seconds     Repeat 10 times   Perform 3 sets daily

## 2016-07-07 NOTE — Therapy (Signed)
[facility] [address] [place], [address] Phone: [phone]   Fax:  [phone]  Physical Therapy Treatment  Patient Details  Name: [name] MRN: [id number] Date of Birth: [birth year] Referring Provider: Deborra Medina, MD  Encounter Date: 07/07/2016      PT End of Session - 07/07/16 1121    Visit Number 2   Number of Visits 13   Date for PT Re-Evaluation 08/13/16   Authorization Type 2   Authorization Time Period of 10 g code   PT Start Time 1120   PT Stop Time 1203   PT Time Calculation (min) 43 min   Activity Tolerance Patient tolerated treatment well   Behavior During Therapy [facility] for tasks assessed/performed      Past Medical History:  Diagnosis Date  . Anxiety   . Back pain   . Chronic orthostatic hypotension June 2012   with pprior syncopal event,  did not tolelrate florinef trial  . Depression   . Hyperlipidemia   . Hypertension     Past Surgical History:  Procedure Laterality Date  . ANKLE SURGERY Left   . CATARACT EXTRACTION, BILATERAL    . WRIST SURGERY Right     There were no vitals filed for this visit.      Subjective Assessment - 07/07/16 1121    Subjective Pt states feeling tired. Had a busy weekend. No really having pain in her L hip area but it bothers her because she has been up watering her grape vines this morning.     Pertinent History Back pain, sacroiliac.  Pt states having back problems for a long long time. Pt stopped eating in 2015 May 18, 2013) after her husband died. Got constipated, and when she used the bathroom, pt strained to get her bowel to move. She heard and felt something pop in her L hip area. Was checked for her low back before and it feels fine. Her pain pain is at her L lateral hip area.  Had an MRI for the back which showed a crack in the top vertebra, which one of her doctors feel like it might have been that pop she heard. Had an injection for  her back which did not help.  Denies loss of bladder and bowel control. MD knows about her constipation.   Denies LE paresthesia.  Pt is also an active person and works regularly in the yard planting flowers and pulling weeds.  Does not sit on a stool.  Had PT for her back before which helped her back but not her L hip.    Patient Stated Goals Get rid of that pain, walk a little further, keep her side from hurting so she can walk a little more straight and not give in to it.    Currently in Pain? Yes   Pain Score --  no pain level provided   Pain Onset More than a month ago                                 PT Education - 07/07/16 1128    Education provided Yes   Education Details ther-ex, HEP   Person(s) Educated Patient   Methods Explanation;Demonstration;Tactile cues;Verbal cues;Handout   Comprehension Returned demonstration;Verbalized understanding        Objectives   There-ex  Directed patient with seated L glute max extension isometrics 10x3 with 5 second  holds  Seated manually resisted clamshell isometrics (hips less than 90 degrees flexion) 10x3 with 5 second holds. Slight L low back discomfort which eases with rest   Seated hip adductor small physioball squeeze with glute max squeeze 10x5 seconds for 3 sets  Sit <> stand from 25 inch high mat table with emphasis on femoral control 8 x 3  Difficulty bringing her center of gravity over her base of support. Improved femoral control after cues  Seated low rows 8x, then 10x 5 second holds resisting yellow band. L low back discomfort which eases with rest  Seated bilateral rows to promote thoracic extension and decrease low back pressure resisting yellow band 10x  Improved exercise technique, movement at target joints, use of target muscles after mod verbal, visual, tactile cues.   Worked on L glute max strengthening to help promote better pelvic positioning in relation to low back, thoracic  extension, hip and trunk muscle strengthening and femoral control to help decrease back and L hip pain when performing standing tasks at home. Pt tolerated session well without aggravation of symptoms.               PT Long Term Goals - 07/02/16 1156      PT LONG TERM GOAL #1   Title Pt will have a decrease in L hip/low back pain to 5/10 or less at worst to promote ability to amblulate longer distances, perform chores at home.    Baseline 10/10 L low back and hip pain at worst for the past month (07/02/2016)   Time 6   Period Weeks   Status New     PT LONG TERM GOAL #2   Title Pt will improve L LE strength by at least 1/2 MMT strength grade to promote ability to ambulate longer distances and perform chores at home with less pain.    Time 6   Period Weeks   Status New     PT LONG TERM GOAL #3   Title Pt will improve her Modified Oswestry Low Back Pain Disability Questionnaire by at least 12% as a demonstration of improved function.    Baseline 34% (07/02/2016)   Time 6   Period Weeks   Status New               Plan - 07/07/16 1143    Clinical Impression Statement Worked on L glute max strengthening to help promote better pelvic positioning in relation to low back, thoracic extension, hip and trunk muscle strengthening and femoral control to help decrease back and L hip pain when performing standing tasks at home. Pt tolerated session well without aggravation of symptoms.    History and Personal Factors relevant to plan of care: Chronicity of pain and posture. L LE weakness. Age. Pt lives alone   Clinical Presentation Stable   Clinical Presentation due to: Pain has not worsened since last session.    Clinical Decision Making Low   Rehab Potential Fair   Clinical Impairments Affecting Rehab Potential Chronicity of pain and posture. L LE weakness. Age   PT Frequency 2x / week   PT Duration 6 weeks   PT Treatment/Interventions Manual techniques;Therapeutic  exercise;Therapeutic activities;Aquatic Therapy;Electrical Stimulation;Iontophoresis 4mg /ml Dexamethasone;Ultrasound;Neuromuscular re-education;Patient/family education;Dry needling   PT Next Visit Plan hip and scapular strengthening, posture, femoral control   Consulted and Agree with Plan of Care Patient      Patient will benefit from skilled therapeutic intervention in order to improve the following deficits and impairments:  Pain,  Postural dysfunction, Improper body mechanics, Difficulty walking, Decreased strength  Visit Diagnosis: Chronic left-sided low back pain, with sciatica presence unspecified  Pain in left hip     Problem List Patient Active Problem List   Diagnosis Date Noted  . Iliac bone pain 07/29/2015  . Osteoporosis 03/13/2015  . Raynaud phenomenon 02/24/2015  . Melancholia 02/09/2015  . Hyponatremia 02/09/2015  . Increased urinary frequency 01/30/2015  . Medicare annual wellness visit, subsequent 01/06/2015  . Encounter for preventive health examination 01/06/2015  . Underweight 06/15/2014  . Chronic left hip pain 06/13/2014  . Benign essential tremor 10/02/2013  . Constipation 08/12/2013  . Lethargy 07/22/2013  . Urinary incontinence, urge 05/29/2012  . Nocturnal leg cramps 12/15/2011  . Screening for breast cancer 10/27/2011  . Degeneration of lumbar or lumbosacral intervertebral disc 09/01/2011  . Insomnia 01/07/2011  . Hyperlipidemia LDL goal <130 11/08/2010  . Back pain, sacroiliac 11/08/2010  . Elevated blood pressure reading in office without diagnosis of hypertension 09/18/2010    Joneen Boers PT, DPT  07/07/2016, 12:22 PM  [facility] [address] [place], [address] Phone: [phone]   Fax:  [phone]  Name: OLUWATAMILORE STARNES MRN: [id number] Date of Birth: Nov 15, 1926

## 2016-07-14 ENCOUNTER — Ambulatory Visit: Payer: Medicare Other

## 2016-07-16 ENCOUNTER — Ambulatory Visit: Payer: Medicare Other

## 2016-07-16 DIAGNOSIS — M545 Low back pain: Secondary | ICD-10-CM

## 2016-07-16 DIAGNOSIS — G8929 Other chronic pain: Secondary | ICD-10-CM

## 2016-07-16 DIAGNOSIS — M25552 Pain in left hip: Secondary | ICD-10-CM | POA: Diagnosis not present

## 2016-07-16 NOTE — Patient Instructions (Signed)
  Standing and holding onto something sturdy for support:   Gently squeeze your rear end muscles together.    Hold for 5 seconds.    Repeat 10 times   Perform for 3 sessions daily.

## 2016-07-16 NOTE — Therapy (Signed)
[facility] [address], [place], [address] Phone: [phone]   Fax:  [phone]  Physical Therapy Treatment  Patient Details  Name: [name] MRN: [id number] Date of Birth: 03/05/1926 Referring Provider: Deborra Medina, MD  Encounter Date: 07/16/2016      PT End of Session - 07/16/16 0855    Visit Number 3   Number of Visits 13   Date for PT Re-Evaluation 08/13/16   Authorization Type 3   Authorization Time Period of 10 g code   PT Start Time 0855   PT Stop Time 0941   PT Time Calculation (min) 46 min   Activity Tolerance Patient tolerated treatment well   Behavior During Therapy [facility] for tasks assessed/performed      Past Medical History:  Diagnosis Date  . Anxiety   . Back pain   . Chronic orthostatic hypotension June 2012   with pprior syncopal event,  did not tolelrate florinef trial  . Depression   . Hyperlipidemia   . Hypertension     Past Surgical History:  Procedure Laterality Date  . ANKLE SURGERY Left   . CATARACT EXTRACTION, BILATERAL    . WRIST SURGERY Right     There were no vitals filed for this visit.      Subjective Assessment - 07/16/16 0857    Subjective L hip/low back feels fair. I've been busy on the move. Did a lot of walking. Did a lot canning, farming, shucking corn. Pt did a lot of standing, sitting, walking, moving. 0/10 L hip pain currently and when walking from waiting room to treatment room.  10/10 at worst for the past 7 days.    Pertinent History Back pain, sacroiliac.  Pt states having back problems for a long long time. Pt stopped eating in 2015 2013/05/14) after her husband died. Got constipated, and when she used the bathroom, pt strained to get her bowel to move. She heard and felt something pop in her L hip area. Was checked for her low back before and it feels fine. Her pain pain is at her L lateral hip area.  Had an MRI for the back which showed a  crack in the top vertebra, which one of her doctors feel like it might have been that pop she heard. Had an injection for her back which did not help.  Denies loss of bladder and bowel control. MD knows about her constipation.   Denies LE paresthesia.  Pt is also an active person and works regularly in the yard planting flowers and pulling weeds.  Does not sit on a stool.  Had PT for her back before which helped her back but not her L hip.    Patient Stated Goals Get rid of that pain, walk a little further, keep her side from hurting so she can walk a little more straight and not give in to it.    Currently in Pain? No/denies   Pain Score 0-No pain   Pain Onset More than a month ago                                 PT Education - 07/16/16 1243    Education provided Yes   Education Details ther-ex, HEP   Person(s) Educated Patient   Methods Explanation;Demonstration;Tactile cues;Verbal cues;Handout   Comprehension Returned demonstration;Verbalized understanding  Objectives   There-ex    Directed patient with seated L glute max extension isometrics 10x3 with 5 second holds with L foot on 3 inch step  Standing low rows resisting yellow band 10x3  Side stepping 5 ft to the L and 5 ft to the R with bilateral UE assist. L pelvic/hip pain which eases with rest.   Standing glute max squeeze 10x3 with 5 second holds. L hip flexor stretch. L posterior pelvic discomfort after 3rd set. Eases with sitting rest   Seated R hip flexion isometrics 10x3 with 5 seconds   Seated side to side pelvic tilting 10x3 to promote trunk mobility  Seated trunk rotation 10x3 to promote trunk mobility    Improved exercise technique, movement at target joints, use of target muscles after min to mod verbal, visual, tactile cues.     Worked on gentle lumbar mobility exercises, as well as exercises to promote use of her trunk and glute muscles to help decrease stress to low  back and L pelvic area with standing activities. Discomfort at times to L PSIS area with standing exercises which eases with sitting rest break. Pt tolerated session well without aggravation of symptoms.                 PT Long Term Goals - 07/02/16 1156      PT LONG TERM GOAL #1   Title Pt will have a decrease in L hip/low back pain to 5/10 or less at worst to promote ability to amblulate longer distances, perform chores at home.    Baseline 10/10 L low back and hip pain at worst for the past month (07/02/2016)   Time 6   Period Weeks   Status New     PT LONG TERM GOAL #2   Title Pt will improve L LE strength by at least 1/2 MMT strength grade to promote ability to ambulate longer distances and perform chores at home with less pain.    Time 6   Period Weeks   Status New     PT LONG TERM GOAL #3   Title Pt will improve her Modified Oswestry Low Back Pain Disability Questionnaire by at least 12% as a demonstration of improved function.    Baseline 34% (07/02/2016)   Time 6   Period Weeks   Status New               Plan - 07/16/16 2774    Clinical Impression Statement Worked on gentle lumbar mobility exercises, as well as exercises to promote use of her trunk and glute muscles to help decrease stress to low back and L pelvic area with standing activities. Discomfort at times to L PSIS area with standing exercises which eases with sitting rest break. Pt tolerated session well without aggravation of symptoms.    History and Personal Factors relevant to plan of care: Chronicity of pain and posture. L LE weakness, age, pt lives alone.   Clinical Presentation Stable   Clinical Presentation due to: maintains 0/10 starting pain level.    Clinical Decision Making Low   Rehab Potential Fair   Clinical Impairments Affecting Rehab Potential Chronicity of pain and posture. L LE weakness. Age   PT Frequency 2x / week   PT Duration 6 weeks   PT Treatment/Interventions Manual  techniques;Therapeutic exercise;Therapeutic activities;Aquatic Therapy;Electrical Stimulation;Iontophoresis 4mg /ml Dexamethasone;Ultrasound;Neuromuscular re-education;Patient/family education;Dry needling   PT Next Visit Plan hip and scapular strengthening, posture, femoral control   Consulted and Agree with Plan of  Care Patient      Patient will benefit from skilled therapeutic intervention in order to improve the following deficits and impairments:  Pain, Postural dysfunction, Improper body mechanics, Difficulty walking, Decreased strength  Visit Diagnosis: Pain in left hip  Chronic left-sided low back pain, with sciatica presence unspecified     Problem List Patient Active Problem List   Diagnosis Date Noted  . Iliac bone pain 07/29/2015  . Osteoporosis 03/13/2015  . Raynaud phenomenon 02/24/2015  . Melancholia 02/09/2015  . Hyponatremia 02/09/2015  . Increased urinary frequency 01/30/2015  . Medicare annual wellness visit, subsequent 01/06/2015  . Encounter for preventive health examination 01/06/2015  . Underweight 06/15/2014  . Chronic left hip pain 06/13/2014  . Benign essential tremor 10/02/2013  . Constipation 08/12/2013  . Lethargy 07/22/2013  . Urinary incontinence, urge 05/29/2012  . Nocturnal leg cramps 12/15/2011  . Screening for breast cancer 10/27/2011  . Degeneration of lumbar or lumbosacral intervertebral disc 09/01/2011  . Insomnia 01/07/2011  . Hyperlipidemia LDL goal <130 11/08/2010  . Back pain, sacroiliac 11/08/2010  . Elevated blood pressure reading in office without diagnosis of hypertension 09/18/2010   Joneen Boers PT, DPT   07/16/2016, 12:55 PM  [facility] [address] [place], [address] Phone: [phone]   Fax:  [phone]  Name: [name] MRN: [id number] Date of Birth: 01-29-1926

## 2016-07-21 ENCOUNTER — Ambulatory Visit: Payer: Medicare Other

## 2016-07-21 DIAGNOSIS — M545 Low back pain: Principal | ICD-10-CM

## 2016-07-21 DIAGNOSIS — M25552 Pain in left hip: Secondary | ICD-10-CM

## 2016-07-21 DIAGNOSIS — G8929 Other chronic pain: Secondary | ICD-10-CM | POA: Diagnosis not present

## 2016-07-21 NOTE — Patient Instructions (Signed)
  ABDUCTION: Standing (Active)   Hold onto something sturdy for safety.   Stand, feet flat. Lift right leg out to side.  Complete __10_ repetitions. Perform __3_ sessions per day.   Repeat for your other side.

## 2016-07-21 NOTE — Therapy (Signed)
[facility] [address] [place], [address] Phone: [phone]   Fax:  [phone]  Physical Therapy Treatment And Discharge Summary  Patient Details  Name: [name] MRN: [id number] Date of Birth: 04/28/1926 Referring Provider: Deborra Medina, MD  Encounter Date: 07/21/2016      PT End of Session - 07/21/16 1037    Visit Number 4   Number of Visits 13   Date for PT Re-Evaluation 08/13/16   Authorization Type 4   Authorization Time Period of 10 g code   PT Start Time 1037   PT Stop Time 1119   PT Time Calculation (min) 42 min   Activity Tolerance Patient tolerated treatment well   Behavior During Therapy [facility] for tasks assessed/performed      Past Medical History:  Diagnosis Date  . Anxiety   . Back pain   . Chronic orthostatic hypotension June 2012   with pprior syncopal event,  did not tolelrate florinef trial  . Depression   . Hyperlipidemia   . Hypertension     Past Surgical History:  Procedure Laterality Date  . ANKLE SURGERY Left   . CATARACT EXTRACTION, BILATERAL    . WRIST SURGERY Right     There were no vitals filed for this visit.      Subjective Assessment - 07/21/16 1039    Subjective L hip/low back is fair. 3/10 currently.  Back bothered her after last session. The walking and doing her errands after PT bothered her back. Pt went to [place].  Pt states that she gets tense with going to PT especially since she has a lot to do such as chores (enjoys chores) but also has to take care of her plumbing as well as her well water bills. She has to do everything by herself. Wants to stop PT for now and will get another referral from her doctor if she needs more. 5/10 back and L hip pain at most for the past 7 days.      Pertinent History Back pain, sacroiliac.  Pt states having back problems for a long long time. Pt stopped eating in 2015 05-12-2013) after her husband died. Got  constipated, and when she used the bathroom, pt strained to get her bowel to move. She heard and felt something pop in her L hip area. Was checked for her low back before and it feels fine. Her pain pain is at her L lateral hip area.  Had an MRI for the back which showed a crack in the top vertebra, which one of her doctors feel like it might have been that pop she heard. Had an injection for her back which did not help.  Denies loss of bladder and bowel control. MD knows about her constipation.   Denies LE paresthesia.  Pt is also an active person and works regularly in the yard planting flowers and pulling weeds.  Does not sit on a stool.  Had PT for her back before which helped her back but not her L hip.    Patient Stated Goals Get rid of that pain, walk a little further, keep her side from hurting so she can walk a little more straight and not give in to it.    Currently in Pain? Yes   Pain Score 3    Pain Onset More than a month ago            [facility] PT Assessment - 07/21/16  1140      Observation/Other Assessments   Modified [place] 20%                             PT Education - 07/21/16 1135    Education provided Yes   Education Details ther-ex, HEP, only do HEP if her blood pressure levels are within proper range. Stop if she feels lightheaded, dizzy, blurred vision, slurred speech and go to ER if she feels symptoms.    Person(s) Educated Patient   Methods Explanation;Demonstration;Tactile cues;Verbal cues;Handout   Comprehension Returned demonstration;Verbalized understanding        Objectives  Pt states that she gets tense with going to PT especially since she has a lot to do such as chores (enjoys chores) but also has to take care of her plumbing as well as her well water bills. She has to do everything by herself. 5/10 back and L hip pain at most for the past 7 days.      There-ex    Directed patient with seated L glute max extension  isometrics 10x3 with 5 second holds with L foot on 3 inch step  Seated physioball: flexion, R and L flexion stretch 10x5 seconds each way   SLS on L LE with R tip toe assist 10x5 seconds. To promote L glute med muscle use. L LE pulling, eases with rest  Standing L hip abduction 10x2 with bilateral UE assist Standing R hip abduction 10x2 with bilateral UE assist   Gait x 6 minutes. No back and L hip pain at start of gait.   915 ft. L low back pain after 5 min and 20 seconds of gait. R pelvic drop with L trunk side bend compensation.   Per pt, she said that she had blood pressure problems since June 2018 due to stress related factors. Blood pressure L arm sitting, mechanically taken: 194/71 192/88 manually taken. Pt states no lightheadedness or dizziness or blurred vision. No slurred speech observed. Exercises stopped due to elevated levels.  Pt states that she took her blood pressure medication this morning.   Manual muscle testing not performed secondary to elevated blood pressure.     Improved exercise technique, movement at target joints, use of target muscles after min to mod verbal, visual, tactile cues.      Pt demonstrates overall decreased L hip and low back pain (5/10 at worst per pt reports) and improved function since initial evaluation. Strength not tested secondary to elevated blood pressure levels. Skilled physical therapy services discharged per pt request secondary to pt stating that she gets tense having to got to PT, especially since she has a lot going on with stuff she has to take care of at home. Pt to continue progress with her exercises at home.          PT Long Term Goals - 07/21/16 1320      PT LONG TERM GOAL #1   Title Pt will have a decrease in L hip/low back pain to 5/10 or less at worst to promote ability to amblulate longer distances, perform chores at home.    Baseline 10/10 L low back and hip pain at worst for the past month (07/02/2016); 5/10 L low  back and L hip pain at worst for the past 7 days (07/21/2016)   Time 6   Period Weeks   Status Achieved     PT LONG TERM GOAL #2   Title  Pt will improve L LE strength by at least 1/2 MMT strength grade to promote ability to ambulate longer distances and perform chores at home with less pain.    Time 6   Period Weeks   Status Deferred     PT LONG TERM GOAL #3   Title Pt will improve her Modified Oswestry Low Back Pain Disability Questionnaire by at least 12% as a demonstration of improved function.    Baseline 34% (07/02/2016); 20% (2016-07-22)   Time 6   Period Weeks   Status Achieved               Plan - 2016/07/22 1318    Clinical Impression Statement Pt demonstrates overall decreased L hip and low back pain (5/10 at worst per pt reports) and improved function since initial evaluation. Strength not tested secondary to elevated blood pressure levels. Skilled physical therapy services discharged per pt request secondary to pt stating that she gets tense having to got to PT, especially since she has a lot going on with stuff she has to take care of at home. Pt to continue progress with her exercises at home.    History and Personal Factors relevant to plan of care: Chronicity of pain and posture, L LE weakness, elevated blood pressure, age, pt lives alone.    Clinical Presentation Stable   Clinical Presentation due to: 5/10 pain at worst for the past 7 days, and improved Modified Oswestry Low Back Pain Questionnaire score suggesting improved function.    Clinical Decision Making Low   Rehab Potential Fair   Clinical Impairments Affecting Rehab Potential Chronicity of pain and posture. L LE weakness. Age   PT Treatment/Interventions Therapeutic exercise;Therapeutic activities;Neuromuscular re-education;Patient/family education   PT Next Visit Plan Continue progress with her HEP   Consulted and Agree with Plan of Care Patient      Patient will benefit from skilled therapeutic  intervention in order to improve the following deficits and impairments:  Pain, Postural dysfunction, Improper body mechanics, Difficulty walking, Decreased strength  Visit Diagnosis: Chronic left-sided low back pain, with sciatica presence unspecified  Pain in left hip       G-Codes - 07/22/16 1322    Functional Assessment Tool Used (Outpatient Only) Modified Oswestry Low Back Pain Disabilty Questionnaire, clinical presentation, patient interview   Functional Limitation Mobility: Walking and moving around   Mobility: Walking and Moving Around Goal Status [phone]) At least 1 percent but less than 20 percent impaired, limited or restricted   Mobility: Walking and Moving Around Discharge Status [phone]) At least 20 percent but less than 40 percent impaired, limited or restricted      Problem List Patient Active Problem List   Diagnosis Date Noted  . Iliac bone pain 07/29/2015  . Osteoporosis 03/13/2015  . Raynaud phenomenon 02/24/2015  . Melancholia 02/09/2015  . Hyponatremia 02/09/2015  . Increased urinary frequency 01/30/2015  . Medicare annual wellness visit, subsequent 01/06/2015  . Encounter for preventive health examination 01/06/2015  . Underweight 06/15/2014  . Chronic left hip pain 06/13/2014  . Benign essential tremor 10/02/2013  . Constipation 08/12/2013  . Lethargy 07/22/2013  . Urinary incontinence, urge 05/29/2012  . Nocturnal leg cramps 12/15/2011  . Screening for breast cancer 10/27/2011  . Degeneration of lumbar or lumbosacral intervertebral disc 09/01/2011  . Insomnia 01/07/2011  . Hyperlipidemia LDL goal <130 11/08/2010  . Back pain, sacroiliac 11/08/2010  . Elevated blood pressure reading in office without diagnosis of hypertension 09/18/2010    Thank you  for your referral.  Joneen Boers PT, DPT   07/21/2016, 1:27 PM  [facility] [address], [place], [address] Phone:  [phone]   Fax:  [phone]  Name: [name] MRN: [id number] Date of Birth: 1926/07/19

## 2016-07-23 ENCOUNTER — Ambulatory Visit: Payer: Medicare Other

## 2016-07-24 ENCOUNTER — Ambulatory Visit (INDEPENDENT_AMBULATORY_CARE_PROVIDER_SITE_OTHER): Payer: Medicare Other | Admitting: Internal Medicine

## 2016-07-24 ENCOUNTER — Encounter: Payer: Self-pay | Admitting: Internal Medicine

## 2016-07-24 DIAGNOSIS — I10 Essential (primary) hypertension: Secondary | ICD-10-CM | POA: Diagnosis not present

## 2016-07-24 DIAGNOSIS — E871 Hypo-osmolality and hyponatremia: Secondary | ICD-10-CM | POA: Diagnosis not present

## 2016-07-24 DIAGNOSIS — F5104 Psychophysiologic insomnia: Secondary | ICD-10-CM | POA: Diagnosis not present

## 2016-07-24 NOTE — Patient Instructions (Addendum)
I recommend that you continue the amlodipine at the  2.5 mg dose  Daily for your blood pressure   I will continue to refill the diazepam to help you rest   You do not need to take B12 , but it will never hurt you!  Return in 6 months, sooner if needed  .

## 2016-07-24 NOTE — Progress Notes (Signed)
Subjective:  Patient ID: [name], female    DOB: 02-14-1926  Age: 83 y.o. MRN: [id number]  CC: Diagnoses of Psychophysiological insomnia, Essential hypertension, and Hyponatremia were pertinent to this visit.  HPI [name] presents for follow up on elevated blood pressure,  Essential tremor,  Chronic insomnia managed with valium due to dc of Librium . Using hydroxyzine once or twice daily as well   Advised to start amlodipine 2.5 mg in mid June at [facility] for bp check.  Patient was very flustered at the time due to household issues(well was not working,  New washing machine turning clothes yellow)   But she  finally started the  medications .  She has been checking her blood pressure 1-2 times daily and has not noticed any difference in her readings since she started it.    Seeing PT for low back pain .  Feels like she "too much on me"  So she suspended  it this week . Thinks it may have been starting to help.  .   Lab Results  Component Value Date   VITAMINB12 >1500 (H) 07/20/2013       Outpatient Medications Prior to Visit  Medication Sig Dispense Refill  . amLODipine (NORVASC) 2.5 MG tablet Take 1 tablet (2.5 mg total) by mouth daily. 90 tablet 0  . diazepam (VALIUM) 5 MG tablet TAKE ONE TABLET BY MOUTH EVERY 8 HOURS AS NEEDED FOR ANXIETY 90 tablet 0  . docusate sodium (COLACE) 100 MG capsule Take 1 capsule (100 mg total) by mouth 2 (two) times daily. 60 capsule 11  . Garlic Oil 2 MG CAPS Take by mouth.    . hydrOXYzine (ATARAX/VISTARIL) 25 MG tablet TAKE ONE (1) CAPSULE THREE (3) TIMES EACH DAY AS NEEDED 90 tablet 1  . Iron-Vitamins (GERITOL PO) Take 1 tablet by mouth daily.    [name] lactulose (CHRONULAC) 10 GM/15ML solution TAKE 30MLS(0NE OUNCE) BY MOUTH 2 TIMES DAILY AS NEEDED FOR MILD CONSTIPATION. DO NOT USE MORE THAN ONCE A WEEK 240 mL 2  . Lutein 20 MG CAPS Take 20 mg by mouth daily.      . magnesium oxide (MAG-OX) 400 MG tablet Take 400 mg by mouth.    . Potassium  Gluconate 2 MEQ TABS Take by mouth.    . propranolol ER (INDERAL LA) 60 MG 24 hr capsule TAKE ONE CAPSULE BY MOUTH AT BEDTIME 30 capsule 3  . traMADol (ULTRAM) 50 MG tablet TAKE ONE TABLET BY MOUTH EVERY 8 HOURS AS NEEDED 90 tablet 0  . VITAMIN E PO Take by mouth.     No facility-administered medications prior to visit.     Review of Systems;  Patient denies headache, fevers, malaise, unintentional weight loss, skin rash, eye pain, sinus congestion and sinus pain, sore throat, dysphagia,  hemoptysis , cough, dyspnea, wheezing, chest pain, palpitations, orthopnea, edema, abdominal pain, nausea, melena, diarrhea, constipation, flank pain, dysuria, hematuria, urinary  Frequency, nocturia, numbness, tingling, seizures,  Focal weakness, Loss of consciousness,  Tremor, insomnia, depression, anxiety, and suicidal ideation.      Objective:  BP (!) 142/78 (BP Location: Left Arm, Patient Position: Sitting, Cuff Size: Normal)   Pulse (!) 57   Temp 98.4 F (36.9 C) (Oral)   Resp 15   Ht 5\' 6"  (1.676 m)   Wt 128 lb 6.4 oz (58.2 kg)   SpO2 96%   BMI 20.72 kg/m   BP Readings from Last 3 Encounters:  07/24/16 (!) 142/78  07/02/16 140/90  06/17/16 (!) 172/82    Wt Readings from Last 3 Encounters:  07/24/16 128 lb 6.4 oz (58.2 kg)  06/16/16 130 lb 12.8 oz (59.3 kg)  12/04/15 129 lb 12 oz (58.9 kg)    General appearance: alert, cooperative and appears stated age Ears: normal TM's and external ear canals both ears Throat: lips, mucosa, and tongue normal; teeth and gums normal Neck: no adenopathy, no carotid bruit, supple, symmetrical, trachea midline and thyroid not enlarged, symmetric, no tenderness/mass/nodules Back: symmetric, no curvature. ROM normal. No CVA tenderness. Lungs: clear to auscultation bilaterally Heart: regular rate and rhythm, S1, S2 normal, no murmur, click, rub or gallop Abdomen: soft, non-tender; bowel sounds normal; no masses,  no organomegaly Pulses: 2+ and  symmetric Skin: Skin color, texture, turgor normal. No rashes or lesions Lymph nodes: Cervical, supraclavicular, and axillary nodes normal.  No results found for: HGBA1C  Lab Results  Component Value Date   CREATININE 0.81 06/09/2016   CREATININE 0.80 12/04/2015   CREATININE 0.79 01/04/2015    Lab Results  Component Value Date   WBC 6.5 01/04/2015   HGB 12.3 01/04/2015   HCT 36.6 01/04/2015   PLT 255.0 01/04/2015   GLUCOSE 96 06/09/2016   CHOL 254 (H) 06/09/2016   TRIG 103.0 06/09/2016   HDL 60.00 06/09/2016   LDLDIRECT 145.6 12/21/2012   LDLCALC 174 (H) 06/09/2016   ALT 8 06/09/2016   AST 18 06/09/2016   NA 134 (L) 06/09/2016   K 4.2 06/09/2016   CL 99 06/09/2016   CREATININE 0.81 06/09/2016   BUN 10 06/09/2016   CO2 31 06/09/2016   TSH 1.92 01/04/2015    No results found.  Assessment & Plan:   Problem List Items Addressed This Visit    Essential hypertension    Continue amlodipine 2.5 mg daily      Hyponatremia    Improved with liberalization of salt in diet.   Lab Results  Component Value Date   NA 134 (L) 06/09/2016   K 4.2 06/09/2016   CL 99 06/09/2016   CO2 31 06/09/2016  ]      Insomnia    Continue valium up to 1.5 tablets at night.  Dc'd clorazepate       A total of 25 minutes of face to face time was spent with patient more than half of which was spent in counselling and coordination of care    I am having Ms. Leet maintain her Lutein, docusate sodium, VITAMIN E PO, lactulose, hydrOXYzine, propranolol ER, traMADol, Garlic Oil, magnesium oxide, Potassium Gluconate, Iron-Vitamins (GERITOL PO), amLODipine, and diazepam.  No orders of the defined types were placed in this encounter.   There are no discontinued medications.  Follow-up: Return in about 6 months (around 01/24/2017).   Gwendolyn Mc, MD

## 2016-07-26 NOTE — Assessment & Plan Note (Signed)
Continue valium up to 1.5 tablets at night.  Dc'd clorazepate

## 2016-07-26 NOTE — Assessment & Plan Note (Signed)
Improved with liberalization of salt in diet.   Lab Results  Component Value Date   NA 134 (L) 06/09/2016   K 4.2 06/09/2016   CL 99 06/09/2016   CO2 31 06/09/2016  ]

## 2016-07-26 NOTE — Assessment & Plan Note (Signed)
Continue amlodipine 2.5 mg daily 

## 2016-08-26 DIAGNOSIS — L570 Actinic keratosis: Secondary | ICD-10-CM | POA: Diagnosis not present

## 2016-08-26 DIAGNOSIS — L2089 Other atopic dermatitis: Secondary | ICD-10-CM | POA: Diagnosis not present

## 2016-08-26 DIAGNOSIS — L821 Other seborrheic keratosis: Secondary | ICD-10-CM | POA: Diagnosis not present

## 2016-09-18 ENCOUNTER — Other Ambulatory Visit: Payer: Self-pay | Admitting: Internal Medicine

## 2016-10-06 ENCOUNTER — Other Ambulatory Visit: Payer: Self-pay | Admitting: Internal Medicine

## 2016-10-06 NOTE — Telephone Encounter (Signed)
Last OV 07/24/2016 Next OV not scheduled. Last refill 06/30/2016

## 2016-10-07 NOTE — Telephone Encounter (Signed)
Attempted to call pt. No answer no voicemail.   Rx has been printed, signed and faxed.

## 2016-10-07 NOTE — Telephone Encounter (Signed)
Refilled for use 1.5 tablet at bedtime.  Needs appt in December

## 2016-10-13 ENCOUNTER — Other Ambulatory Visit: Payer: Self-pay | Admitting: Internal Medicine

## 2016-11-09 ENCOUNTER — Other Ambulatory Visit: Payer: Self-pay | Admitting: Internal Medicine

## 2016-11-10 NOTE — Addendum Note (Signed)
Addended by: Francella Solian on: 11/10/2016 04:07 PM   Modules accepted: Orders

## 2016-11-10 NOTE — Telephone Encounter (Signed)
Last seen in office 07/24/16 no f/u at this time.

## 2016-11-11 ENCOUNTER — Other Ambulatory Visit: Payer: Self-pay | Admitting: Internal Medicine

## 2016-11-11 MED ORDER — TRAMADOL HCL 50 MG PO TABS: 50.0000 mg | ORAL_TABLET | Freq: Three times a day (TID) | ORAL | 2 refills | Status: DC | PRN

## 2016-11-11 NOTE — Addendum Note (Signed)
Addended by: Crecencio Mc on: 11/11/2016 03:24 PM   Modules accepted: Orders

## 2016-11-11 NOTE — Telephone Encounter (Signed)
Refilled for 2 months, Needs to be seen in January prior to any more refills

## 2016-11-11 NOTE — Telephone Encounter (Signed)
Spoke with pt and informed her that we have faxed over the rx. Also scheduled the pt for a CPE in December since she has not had one yet this year. Pt is aware of appt date and time.

## 2016-11-16 ENCOUNTER — Ambulatory Visit
Admission: EM | Admit: 2016-11-16 | Discharge: 2016-11-16 | Disposition: A | Discharge status: Home / Self Care | Payer: Medicare Other | Attending: Family Medicine | Admitting: Family Medicine

## 2016-11-16 ENCOUNTER — Encounter: Payer: Self-pay | Admitting: *Deleted

## 2016-11-16 DIAGNOSIS — F329 Major depressive disorder, single episode, unspecified: Secondary | ICD-10-CM | POA: Insufficient documentation

## 2016-11-16 DIAGNOSIS — Z79899 Other long term (current) drug therapy: Secondary | ICD-10-CM | POA: Diagnosis not present

## 2016-11-16 DIAGNOSIS — F419 Anxiety disorder, unspecified: Secondary | ICD-10-CM | POA: Diagnosis not present

## 2016-11-16 DIAGNOSIS — I10 Essential (primary) hypertension: Secondary | ICD-10-CM | POA: Diagnosis not present

## 2016-11-16 DIAGNOSIS — E785 Hyperlipidemia, unspecified: Secondary | ICD-10-CM | POA: Insufficient documentation

## 2016-11-16 DIAGNOSIS — R350 Frequency of micturition: Secondary | ICD-10-CM | POA: Insufficient documentation

## 2016-11-16 LAB — URINALYSIS, COMPLETE (UACMP) WITH MICROSCOPIC
Bacteria, UA: NONE SEEN
Bilirubin Urine: NEGATIVE
Glucose, UA: NEGATIVE mg/dL
Hgb urine dipstick: NEGATIVE
Ketones, ur: NEGATIVE mg/dL
Leukocytes, UA: NEGATIVE
Nitrite: NEGATIVE
Protein, ur: NEGATIVE mg/dL
RBC / HPF: NONE SEEN RBC/hpf (ref 0–5)
Specific Gravity, Urine: 1.01 (ref 1.005–1.030)
WBC, UA: NONE SEEN WBC/hpf (ref 0–5)
pH: 7.5 (ref 5.0–8.0)

## 2016-11-16 LAB — GLUCOSE, CAPILLARY: Glucose-Capillary: 90 mg/dL (ref 65–99)

## 2016-11-16 NOTE — ED Triage Notes (Signed)
Urinary frequency, onset Saturday morning. Pt denies any other symptoms.

## 2016-11-16 NOTE — ED Provider Notes (Signed)
[facility]    CSN: [id number] Arrival date & time: 11/16/16  0907     History   Chief Complaint Chief Complaint  Patient presents with  . Urinary Frequency    HPI [name] is a 83 y.o. female.   83 yo female with a c/o frequent urination for the past 2 days. Denies any other symptoms. Denies dysuria, hematuria, fevers, chills, abdominal pain.    The history is provided by the patient.    Past Medical History:  Diagnosis Date  . Anxiety   . Back pain   . Chronic orthostatic hypotension June 2012   with pprior syncopal event,  did not tolelrate florinef trial  . Depression   . Hyperlipidemia   . Hypertension     Patient Active Problem List   Diagnosis Date Noted  . Iliac bone pain 07/29/2015  . Osteoporosis 03/13/2015  . Raynaud phenomenon 02/24/2015  . Hyponatremia 02/09/2015  . Increased urinary frequency 01/30/2015  . Medicare annual wellness visit, subsequent 01/06/2015  . Encounter for preventive health examination 01/06/2015  . Underweight 06/15/2014  . Chronic left hip pain 06/13/2014  . Benign essential tremor 10/02/2013  . Constipation 08/12/2013  . Urinary incontinence, urge 05/29/2012  . Nocturnal leg cramps 12/15/2011  . Screening for breast cancer 10/27/2011  . Degeneration of lumbar or lumbosacral intervertebral disc 09/01/2011  . Insomnia 01/07/2011  . Hyperlipidemia LDL goal <130 11/08/2010  . Back pain, sacroiliac 11/08/2010  . Essential hypertension 09/18/2010    Past Surgical History:  Procedure Laterality Date  . ANKLE SURGERY Left   . CATARACT EXTRACTION, BILATERAL    . WRIST SURGERY Right     OB History    No data available       Home Medications    Prior to Admission medications   Medication Sig Start Date End Date Taking? Authorizing Provider  amLODipine (NORVASC) 2.5 MG tablet TAKE ONE TABLET BY MOUTH EVERY DAY 10/13/16  Yes Crecencio Mc, MD  diazepam (VALIUM) 5 MG tablet Take 1.5 tablets (7.5  mg total) by mouth every 8 (eight) hours as needed (insomnia). for anxiety 10/07/16  Yes Crecencio Mc, MD  docusate sodium (COLACE) 100 MG capsule Take 1 capsule (100 mg total) by mouth 2 (two) times daily. 10/31/13  Yes Crecencio Mc, MD  Garlic Oil 2 MG CAPS Take by mouth.   Yes [provider]  Lutein 20 MG CAPS Take 20 mg by mouth daily.     Yes [provider]  magnesium oxide (MAG-OX) 400 MG tablet Take 400 mg by mouth.   Yes [provider]  Potassium Gluconate 2 MEQ TABS Take by mouth.   Yes [provider]  propranolol ER (INDERAL LA) 60 MG 24 hr capsule TAKE ONE CAPSULE BY MOUTH AT BEDTIME 04/06/16  Yes Crecencio Mc, MD  traMADol (ULTRAM) 50 MG tablet Take 1 tablet (50 mg total) every 8 (eight) hours as needed by mouth. 11/11/16  Yes Crecencio Mc, MD  VITAMIN E PO Take by mouth.   Yes [provider]  hydrOXYzine (ATARAX/VISTARIL) 25 MG tablet TAKE ONE CAPSULE BY MOUTH THREE TIMES DAILY AS NEEDED 09/18/16   Crecencio Mc, MD  Iron-Vitamins (GERITOL PO) Take 1 tablet by mouth daily.    [provider]  lactulose (CHRONULAC) 10 GM/15ML solution TAKE 30MLS(0NE OUNCE) BY MOUTH 2 TIMES DAILY AS NEEDED FOR MILD CONSTIPATION. DO NOT USE MORE THAN ONCE A WEEK 08/23/15   Derrel Nip,  Aris Everts, MD    Family History Family History  Problem Relation Age of Onset  . Cancer Neg Hx   . Diabetes Neg Hx   . Heart disease Neg Hx     Social History Social History   Tobacco Use  . Smoking status: Never Smoker  . Smokeless tobacco: Never Used  Substance Use Topics  . Alcohol use: No  . Drug use: No     Allergies   Patient has no known allergies.   Review of Systems Review of Systems   Physical Exam Triage Vital Signs ED Triage Vitals  Enc Vitals Group     BP 11/16/16 0927 (!) 183/79     Pulse Rate 11/16/16 0927 (!) 54     Resp 11/16/16 0927 16     Temp 11/16/16 0927 97.6 F (36.4 C)     Temp src --      SpO2 11/16/16 0927  99 %     Weight 11/16/16 0933 130 lb (59 kg)     Height 11/16/16 0933 5\' 7"  (1.702 m)     Head Circumference --      Peak Flow --      Pain Score --      Pain Loc --      Pain Edu? --      Excl. in [place]? --    No data found.  Updated Vital Signs BP (!) 183/79 (BP Location: Left Arm)   Pulse (!) 54   Temp 97.6 F (36.4 C)   Resp 16   Ht 5\' 7"  (1.702 m)   Wt 130 lb (59 kg)   SpO2 99%   BMI 20.36 kg/m   Visual Acuity Right Eye Distance:   Left Eye Distance:   Bilateral Distance:    Right Eye Near:   Left Eye Near:    Bilateral Near:     Physical Exam  Constitutional: She appears well-developed and well-nourished. No distress.  Abdominal: Soft. Bowel sounds are normal. She exhibits no distension and no mass. There is no tenderness. There is no rebound and no guarding.  Skin: She is not diaphoretic.  Nursing note and vitals reviewed.    UC Treatments / Results  Labs (all labs ordered are listed, but only abnormal results are displayed) Labs Reviewed  URINALYSIS, COMPLETE (UACMP) WITH MICROSCOPIC - Abnormal; Notable for the following components:      Result Value   Squamous Epithelial / LPF 0-5 (*)    All other components within normal limits  GLUCOSE, CAPILLARY    EKG  EKG Interpretation None       Radiology No results found.  Procedures Procedures (including critical care time)  Medications Ordered in UC Medications - No data to display   Initial Impression / Assessment and Plan / UC Course  I have reviewed the triage vital signs and the nursing notes.  Pertinent labs & imaging results that were available during my care of the patient were reviewed by me and considered in my medical decision making (see chart for details).       Final Clinical Impressions(s) / UC Diagnoses   Final diagnoses:  Urinary frequency    ED Discharge Orders    None     1. Lab results and diagnosis reviewed with patient 2. Recommend supportive treatment with  increased water intake 3. Follow-up prn if symptoms worsen or don't improve  Controlled Substance Prescriptions [place] Controlled Substance Registry consulted? Not Applicable   Norval Gable, MD 11/16/16 [phone]

## 2016-11-16 NOTE — Discharge Instructions (Signed)
Follow up with Primary Care provider 

## 2016-11-25 IMAGING — DX DG HIP (WITH OR WITHOUT PELVIS) 2-3V*L*
3 series · 3 of 3 positions shown · non-contrast
Comparison: None.

CLINICAL DATA: Left iliac pain

EXAM:
DG HIP (WITH OR WITHOUT PELVIS) 2-3V LEFT

[pelvis ap]
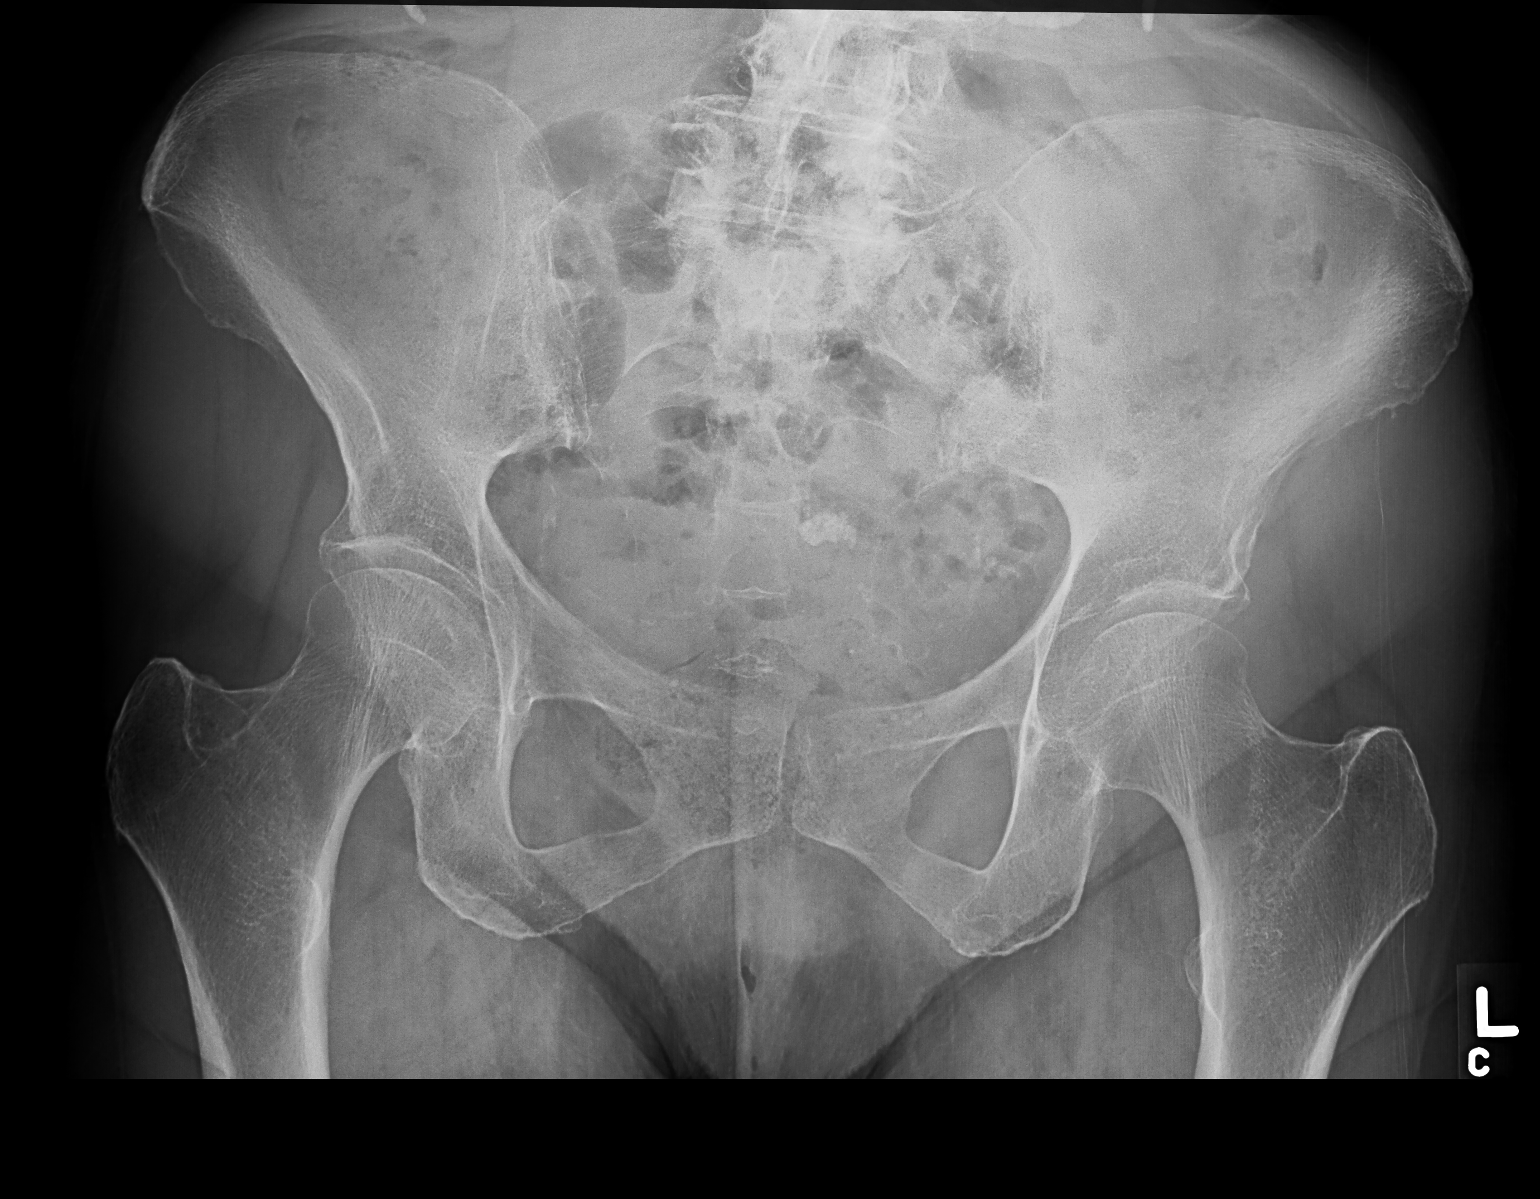

[hip joint ap]
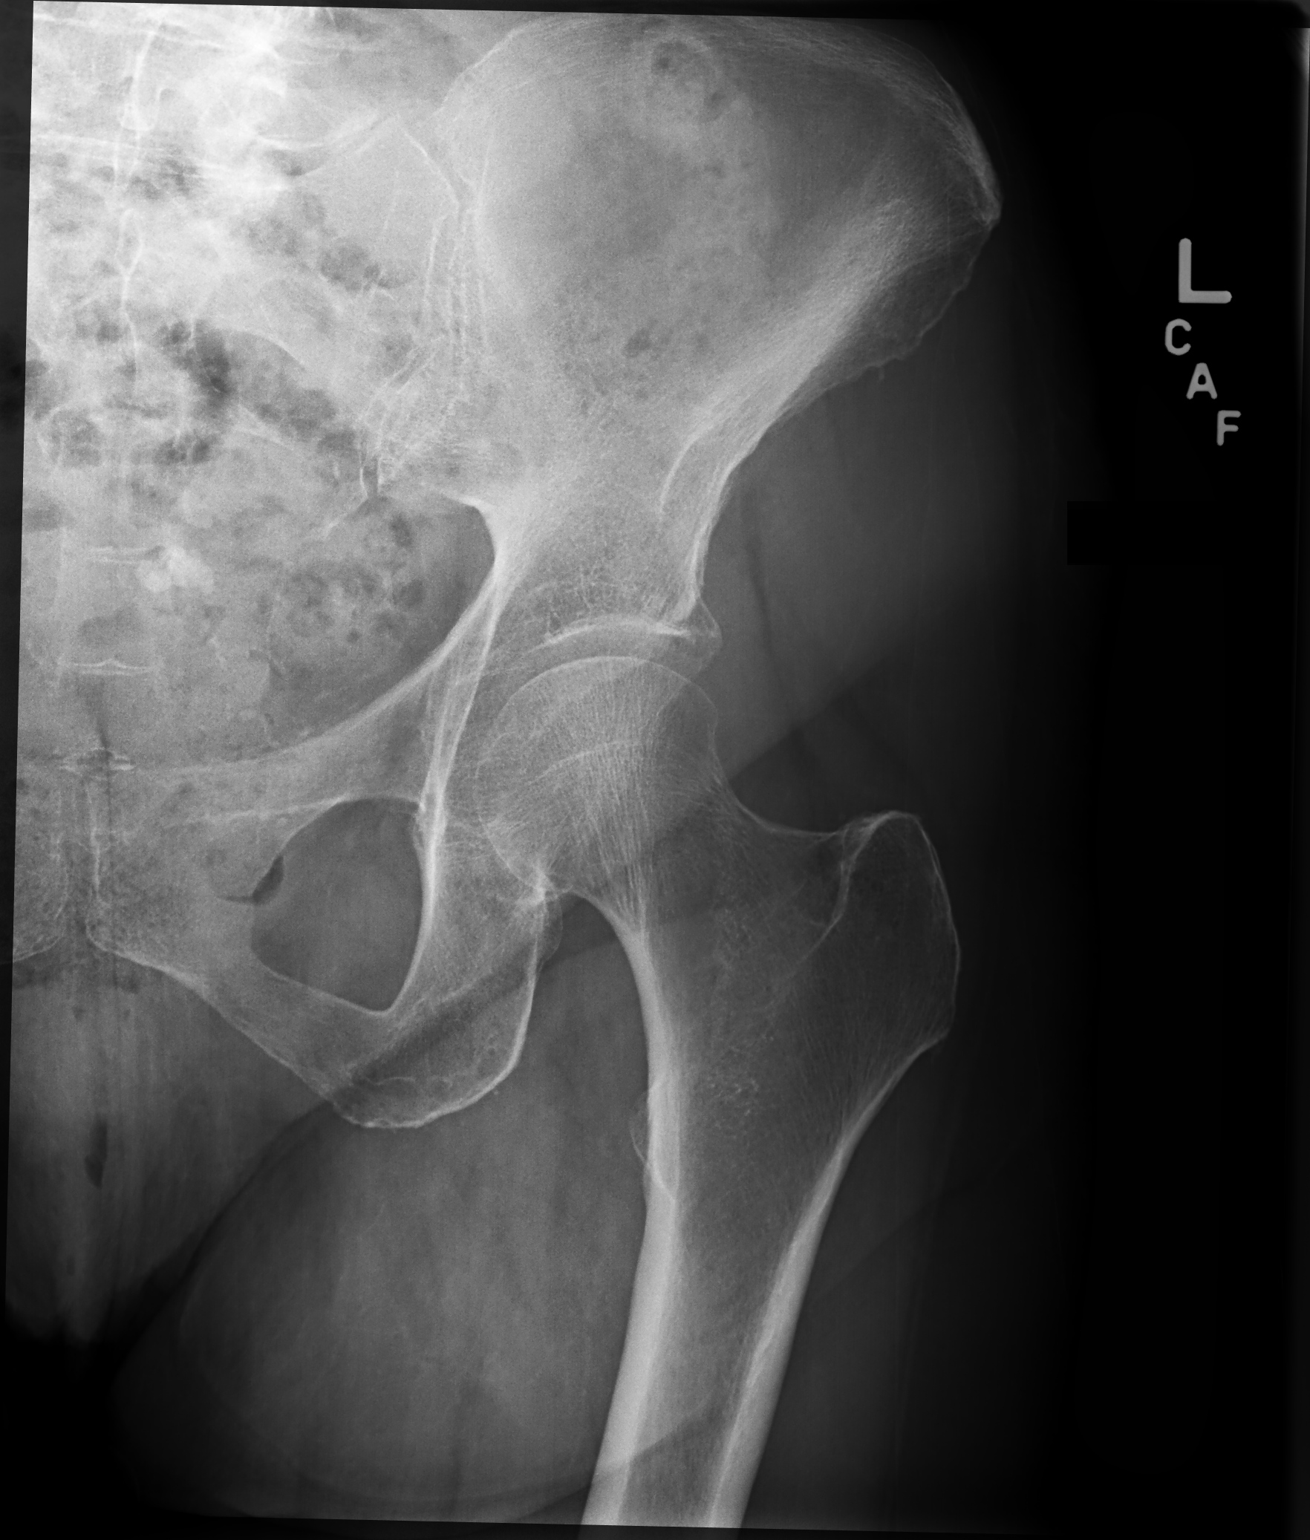

[ap frog obl (oblique)]
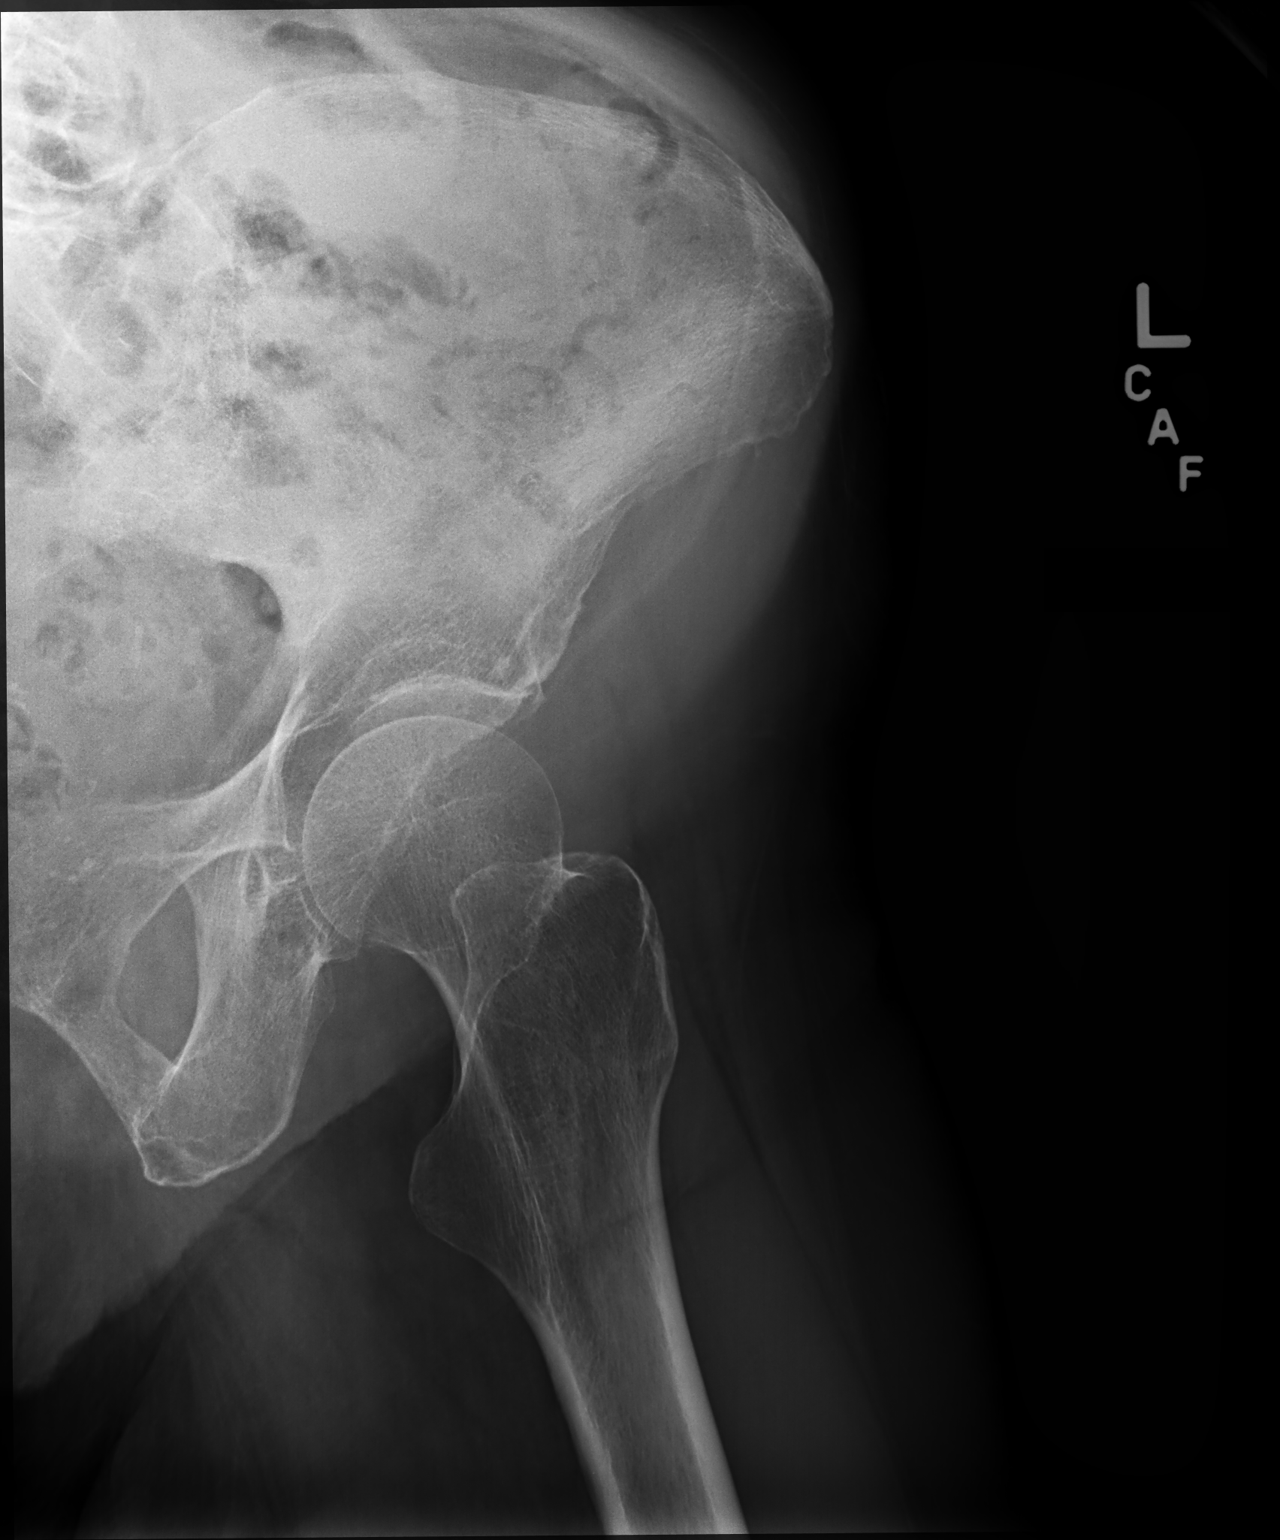

[3 of 3 positions shown; findings below may reference images not displayed]

FINDINGS: No fracture or dislocation is seen.

Bilobed hip joint spaces are preserved.

Visualized bony pelvis appears intact.

Degenerative changes of the lower lumbar spine.
IMPRESSION: No acute osseus abnormality is seen.

## 2016-11-27 DIAGNOSIS — H34239 Retinal artery branch occlusion, unspecified eye: Secondary | ICD-10-CM | POA: Diagnosis not present

## 2016-11-30 ENCOUNTER — Emergency Department (HOSPITAL_COMMUNITY)
Admission: EM | Admit: 2016-11-30 | Discharge: 2016-11-30 | Disposition: A | Discharge status: Home / Self Care | Payer: Medicare Other | Attending: Emergency Medicine | Admitting: Emergency Medicine

## 2016-11-30 ENCOUNTER — Encounter (HOSPITAL_COMMUNITY): Payer: Self-pay

## 2016-11-30 ENCOUNTER — Other Ambulatory Visit: Payer: Self-pay

## 2016-11-30 ENCOUNTER — Emergency Department (HOSPITAL_COMMUNITY): Payer: Medicare Other

## 2016-11-30 DIAGNOSIS — I10 Essential (primary) hypertension: Secondary | ICD-10-CM | POA: Diagnosis not present

## 2016-11-30 DIAGNOSIS — Z79899 Other long term (current) drug therapy: Secondary | ICD-10-CM | POA: Diagnosis not present

## 2016-11-30 DIAGNOSIS — Z8673 Personal history of transient ischemic attack (TIA), and cerebral infarction without residual deficits: Secondary | ICD-10-CM | POA: Diagnosis not present

## 2016-11-30 DIAGNOSIS — H53131 Sudden visual loss, right eye: Secondary | ICD-10-CM | POA: Insufficient documentation

## 2016-11-30 DIAGNOSIS — H5461 Unqualified visual loss, right eye, normal vision left eye: Secondary | ICD-10-CM | POA: Diagnosis not present

## 2016-11-30 DIAGNOSIS — H3411 Central retinal artery occlusion, right eye: Secondary | ICD-10-CM | POA: Diagnosis not present

## 2016-11-30 DIAGNOSIS — H547 Unspecified visual loss: Secondary | ICD-10-CM | POA: Diagnosis not present

## 2016-11-30 LAB — I-STAT CHEM 8, ED
BUN: 11 mg/dL (ref 6–20)
Calcium, Ion: 1.14 mmol/L — ABNORMAL LOW (ref 1.15–1.40)
Chloride: 100 mmol/L — ABNORMAL LOW (ref 101–111)
Creatinine, Ser: 0.9 mg/dL (ref 0.44–1.00)
Glucose, Bld: 97 mg/dL (ref 65–99)
HCT: 36 % (ref 36.0–46.0)
Hemoglobin: 12.2 g/dL (ref 12.0–15.0)
Potassium: 4.2 mmol/L (ref 3.5–5.1)
Sodium: 134 mmol/L — ABNORMAL LOW (ref 135–145)
TCO2: 25 mmol/L (ref 22–32)

## 2016-11-30 LAB — PROTIME-INR
INR: 0.99
Prothrombin Time: 13 seconds (ref 11.4–15.2)

## 2016-11-30 LAB — DIFFERENTIAL
Basophils Absolute: 0 10*3/uL (ref 0.0–0.1)
Basophils Relative: 1 %
Eosinophils Absolute: 0.1 10*3/uL (ref 0.0–0.7)
Eosinophils Relative: 2 %
Lymphocytes Relative: 29 %
Lymphs Abs: 1.8 10*3/uL (ref 0.7–4.0)
Monocytes Absolute: 0.6 10*3/uL (ref 0.1–1.0)
Monocytes Relative: 10 %
Neutro Abs: 3.7 10*3/uL (ref 1.7–7.7)
Neutrophils Relative %: 60 %

## 2016-11-30 LAB — SEDIMENTATION RATE: Sed Rate: 20 mm/hr (ref 0–22)

## 2016-11-30 LAB — CBC
HCT: 35.8 % — ABNORMAL LOW (ref 36.0–46.0)
Hemoglobin: 12.5 g/dL (ref 12.0–15.0)
MCH: 34.2 pg — ABNORMAL HIGH (ref 26.0–34.0)
MCHC: 34.9 g/dL (ref 30.0–36.0)
MCV: 98.1 fL (ref 78.0–100.0)
Platelets: 294 10*3/uL (ref 150–400)
RBC: 3.65 MIL/uL — ABNORMAL LOW (ref 3.87–5.11)
RDW: 12.7 % (ref 11.5–15.5)
WBC: 6.1 10*3/uL (ref 4.0–10.5)

## 2016-11-30 LAB — COMPREHENSIVE METABOLIC PANEL
ALT: 12 U/L — ABNORMAL LOW (ref 14–54)
AST: 24 U/L (ref 15–41)
Albumin: 3.8 g/dL (ref 3.5–5.0)
Alkaline Phosphatase: 48 U/L (ref 38–126)
Anion gap: 6 (ref 5–15)
BUN: 10 mg/dL (ref 6–20)
CO2: 26 mmol/L (ref 22–32)
Calcium: 9.2 mg/dL (ref 8.9–10.3)
Chloride: 101 mmol/L (ref 101–111)
Creatinine, Ser: 0.82 mg/dL (ref 0.44–1.00)
GFR calc Af Amer: >60 mL/min (ref >60)
GFR calc non Af Amer: >60 mL/min (ref >60)
Glucose, Bld: 97 mg/dL (ref 65–99)
Potassium: 4.3 mmol/L (ref 3.5–5.1)
Sodium: 133 mmol/L — ABNORMAL LOW (ref 135–145)
Total Bilirubin: 1 mg/dL (ref 0.3–1.2)
Total Protein: 6.7 g/dL (ref 6.5–8.1)

## 2016-11-30 LAB — I-STAT TROPONIN, ED: Troponin i, poc: 0.01 ng/mL (ref 0.00–0.08)

## 2016-11-30 LAB — APTT: aPTT: 32 seconds (ref 24–36)

## 2016-11-30 NOTE — ED Notes (Signed)
CBG taken at 13:52 was 97.

## 2016-11-30 NOTE — Pre-Procedure Instructions (Signed)
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    Although there is no proven treatment for CRAO, guidelines recommend evaluation and treatment similar to a cerebrovascular accident, therefore I recommended that she go to the ED for stroke evaluation.  Potential etiologies of CRAO for elderly patients include Giant cell arter itis (GCA), so an ESR and CRP should be obtained even though she has no symptoms of headache, jaw claudication, scalp tenderness, etc.  Regards, Benay Pillow, MD [facility].

## 2016-11-30 NOTE — ED Triage Notes (Signed)
Pt presents for evaluation of loss of vision to R eye on Thursday evening around 7 PM. States saw eye doctor today, but vision is intermittently "coming and going". Denies focal weakness to extremities or numbness/tingling.

## 2016-11-30 NOTE — ED Provider Notes (Signed)
[facility] EMERGENCY DEPARTMENT Provider Note   CSN: [id number] Arrival date & time: 11/30/16  1218     History   Chief Complaint Chief Complaint  Patient presents with  . Visual Field Change    HPI [name] is a 83 y.o. female.  HPI  [name] is an 83 year old female with a history of hypertension, hyperlipidemia, TIA, essential tremor, osteoporosis who presents emergency department for evaluation of right eye visual loss.  Patient states that she was watching TV four nights ago when she had sudden onset of painless total vision loss in her right eye.  States that she has had this in the past (July, 2018) and it resolved on its own.  She reports that she went to bed and woke up the next morning without resolution of her visual loss.  Was seen by her ophthalmologist who told her that she may have a central retinal artery occlusion and told to go to the ER to be evaluated for this. Patient states that she was busy over the weekend and wanted to wait to see if it would resolve on its own. Today she reports that she has gained some lateral peripheral vision back, and she intermittently states that she has gained her central vision back in the right eye. She denies headache, confusion, dizziness, dysarthria, dysphagia, numbness, weakness, chest pain, shortness of breath, abdominal pain, N/V, dysuria, urinary frequency. She states that she had two sisters who had strokes.  Past Medical History:  Diagnosis Date  . Anxiety   . Back pain   . Chronic orthostatic hypotension June 2012   with pprior syncopal event,  did not tolelrate florinef trial  . Depression   . Hyperlipidemia   . Hypertension     Patient Active Problem List   Diagnosis Date Noted  . Iliac bone pain 07/29/2015  . Osteoporosis 03/13/2015  . Raynaud phenomenon 02/24/2015  . Hyponatremia 02/09/2015  . Increased urinary frequency 01/30/2015  . Medicare annual wellness visit, subsequent  01/06/2015  . Encounter for preventive health examination 01/06/2015  . Underweight 06/15/2014  . Chronic left hip pain 06/13/2014  . Benign essential tremor 10/02/2013  . Constipation 08/12/2013  . Urinary incontinence, urge 05/29/2012  . Nocturnal leg cramps 12/15/2011  . Screening for breast cancer 10/27/2011  . Degeneration of lumbar or lumbosacral intervertebral disc 09/01/2011  . Insomnia 01/07/2011  . Hyperlipidemia LDL goal <130 11/08/2010  . Back pain, sacroiliac 11/08/2010  . Essential hypertension 09/18/2010    Past Surgical History:  Procedure Laterality Date  . ANKLE SURGERY Left   . CATARACT EXTRACTION, BILATERAL    . WRIST SURGERY Right     OB History    No data available       Home Medications    Prior to Admission medications   Medication Sig Start Date End Date Taking? Authorizing Provider  amLODipine (NORVASC) 2.5 MG tablet TAKE ONE TABLET BY MOUTH EVERY DAY 10/13/16  Yes Crecencio Mc, MD  docusate sodium (COLACE) 100 MG capsule Take 1 capsule (100 mg total) by mouth 2 (two) times daily. Patient taking differently: Take 200 mg by mouth 2 (two) times daily.  10/31/13  Yes Crecencio Mc, MD  Garlic Oil 2 MG CAPS Take by mouth.   Yes [provider]  hydrOXYzine (ATARAX/VISTARIL) 25 MG tablet TAKE ONE CAPSULE BY MOUTH THREE TIMES DAILY AS NEEDED 09/18/16  Yes Crecencio Mc, MD  Iron-Vitamins (GERITOL PO) Take 1 tablet by mouth daily.  Yes [provider]  lactulose (CHRONULAC) 10 GM/15ML solution TAKE 30MLS(0NE OUNCE) BY MOUTH 2 TIMES DAILY AS NEEDED FOR MILD CONSTIPATION. DO NOT USE MORE THAN ONCE A WEEK 08/23/15  Yes Crecencio Mc, MD  Lutein 20 MG CAPS Take 20 mg by mouth daily.     Yes [provider]  magnesium oxide (MAG-OX) 400 MG tablet Take 400 mg by mouth.   Yes [provider]  Potassium Gluconate 2 MEQ TABS Take by mouth.   Yes [provider]  propranolol ER (INDERAL LA) 60 MG 24 hr capsule  TAKE ONE CAPSULE BY MOUTH AT BEDTIME Patient taking differently: TAKE ONE CAPSULE BY MOUTH AT BEDTIME EVERY OTHER DAY 04/06/16  Yes Crecencio Mc, MD  VITAMIN E PO Take 400 Units by mouth daily.    Yes [provider]  diazepam (VALIUM) 5 MG tablet Take 1.5 tablets (7.5 mg total) by mouth every 8 (eight) hours as needed (insomnia). for anxiety Patient taking differently: Take 5 mg by mouth at bedtime as needed (insomnia). for anxiety 10/07/16   Crecencio Mc, MD  traMADol (ULTRAM) 50 MG tablet Take 1 tablet (50 mg total) every 8 (eight) hours as needed by mouth. 11/11/16   Crecencio Mc, MD    Family History Family History  Problem Relation Age of Onset  . Cancer Neg Hx   . Diabetes Neg Hx   . Heart disease Neg Hx     Social History Social History   Tobacco Use  . Smoking status: Never Smoker  . Smokeless tobacco: Never Used  Substance Use Topics  . Alcohol use: No  . Drug use: No     Allergies   Patient has no known allergies.   Review of Systems Review of Systems  Constitutional: Negative for chills, fatigue and fever.  HENT: Negative for trouble swallowing.   Eyes: Positive for visual disturbance. Negative for pain and redness.  Respiratory: Negative for cough and shortness of breath.   Cardiovascular: Negative for chest pain and palpitations.  Gastrointestinal: Negative for abdominal pain, nausea and vomiting.  Genitourinary: Negative for difficulty urinating, dysuria and frequency.  Musculoskeletal: Negative for gait problem.  Skin: Negative for wound.  Neurological: Negative for dizziness, speech difficulty, weakness, light-headedness, numbness and headaches.  Psychiatric/Behavioral: Negative for agitation.     Physical Exam Updated Vital Signs BP (!) 177/70 (BP Location: Right Arm)   Pulse (!) 58   Temp 98.4 F (36.9 C) (Oral)   Resp 16   SpO2 100%   Physical Exam  Constitutional: She is oriented to person, place, and time. She appears  well-developed and well-nourished. No distress.  HENT:  Head: Normocephalic and atraumatic.  Mouth/Throat: Oropharynx is clear and moist. No oropharyngeal exudate.  Eyes: Conjunctivae and EOM are normal. Pupils are equal, round, and reactive to light. Right eye exhibits no discharge. Left eye exhibits no discharge.  Peripheral vision loss in the right eye, primarily medial visual loss. Lateral, inferior and superior fields intact to confrontation. Fundoscopic exam: optic disc with sharp margins.   Neck: Normal range of motion. Neck supple.  No carotid bruits  Cardiovascular: Normal rate, regular rhythm and intact distal pulses. Exam reveals no friction rub.  No murmur heard. Pulmonary/Chest: Effort normal and breath sounds normal. No stridor. No respiratory distress. She has no wheezes.  Abdominal: Soft. Bowel sounds are normal. There is no tenderness. There is no guarding.  Lymphadenopathy:    She has no cervical adenopathy.  Neurological: She  is alert and oriented to person, place, and time. Coordination normal.  Resting head tremor Mental Status:  Alert, oriented, thought content appropriate, able to give a coherent history. Speech fluent without evidence of aphasia. Able to follow 2 step commands without difficulty.  Cranial Nerves:  II:  See eye exam for peripheral vision. Pupils equal, round, reactive to light III,IV, VI: ptosis not present, extra-ocular motions intact bilaterally  V,VII: smile symmetric, facial light touch sensation equal VIII: hearing grossly normal to voice  X: uvula elevates symmetrically  XI: bilateral shoulder shrug symmetric and strong XII: midline tongue extension without fassiculations Motor:  Normal tone. 5/5 in upper and lower extremities bilaterally including strong and equal grip strength and dorsiflexion/plantar flexion Sensory: Pinprick and light touch normal in all extremities.  Deep Tendon Reflexes: 2+ and symmetric in the biceps and  patella Cerebellar: normal finger-to-nose with bilateral upper extremities Gait: normal gait and balance  Skin: Skin is warm and dry. Capillary refill takes less than 2 seconds. She is not diaphoretic.  Psychiatric: She has a normal mood and affect. Her behavior is normal.  Nursing note and vitals reviewed.    ED Treatments / Results  Labs (all labs ordered are listed, but only abnormal results are displayed) Labs Reviewed  CBC - Abnormal; Notable for the following components:      Result Value   RBC 3.65 (*)    HCT 35.8 (*)    MCH 34.2 (*)    All other components within normal limits  COMPREHENSIVE METABOLIC PANEL - Abnormal; Notable for the following components:   Sodium 133 (*)    ALT 12 (*)    All other components within normal limits  I-STAT CHEM 8, ED - Abnormal; Notable for the following components:   Sodium 134 (*)    Chloride 100 (*)    Calcium, Ion 1.14 (*)    All other components within normal limits  PROTIME-INR  APTT  DIFFERENTIAL  SEDIMENTATION RATE  I-STAT TROPONIN, ED  CBG MONITORING, ED    EKG  EKG Interpretation None       Radiology Mr Brain Wo Contrast  Result Date: 11/30/2016 CLINICAL DATA:  83 y/o F; intermittent vision loss of the right eye. History of hypertension. EXAM: MRI HEAD WITHOUT CONTRAST TECHNIQUE: Multiplanar, multiecho pulse sequences of the brain and surrounding structures were obtained without intravenous contrast. COMPARISON:  07/29/2013 MRI of the head FINDINGS: Brain: No acute infarction, hemorrhage, hydrocephalus, extra-axial collection or mass lesion. Interval small chronic lacunar infarcts are present within the right M2 putamen and caudate head. Stable chronic right paramedian infarct of the right pons with volume loss. Mild interval progression of chronic microvascular ischemic changes of white matter. Stable brain parenchymal volume loss. Vascular: Normal flow voids. Skull and upper cervical spine: Calvarial lesion  identified. C3-4 disc protrusion again seen with anterior cord impingement. Sinuses/Orbits: Negative. Other: Bilateral intra-ocular lens replacement. IMPRESSION: 1. No acute intracranial abnormality. 2. Interval chronic lacunar infarcts within right anterior putamen and caudate head. Stable chronic right paramedian pontine infarct. 3. Mild progression of chronic microvascular ischemic changes. Stable mild parenchymal volume loss for age. Electronically Signed   By: Kristine Garbe M.D.   On: 11/30/2016 16:39    Procedures Procedures (including critical care time)  Medications Ordered in ED Medications - No data to display   Initial Impression / Assessment and Plan / ED Course  I have reviewed the triage vital signs and the nursing notes.  Pertinent labs & imaging results that  were available during my care of the patient were reviewed by me and considered in my medical decision making (see chart for details).    Patient presents four days after painless total vision loss in right eye. She reports gaining lateral and central vision today. Exam with medial peripheral vision loss of the right eye. No other focal neurological deficits.   Basic labs reviewed.  CBC and CMP unremarkable.  Troponin negative.  MRI reveals no acute infarct, chronic stable previous right pontine infarct. Discussed these results with patient and engaged in shared decision making. She states that she would like to go home and follow up with her primary doctor. I counseled her that she will need to be evaluated for stroke risk factors and patient agrees and voices understanding along with family members at bedside.  Patient's blood pressure was elevated in the emergency department, discussed having this rechecked. Counseled patient on return precautions and she agrees. Discussed this patient with Dr. Lacinda Axon who also saw the patient and agrees with the above plan.  Final Clinical Impressions(s) / ED Diagnoses   Final  diagnoses:  Vision loss of right eye    ED Discharge Orders    None       Bernarda Caffey 12/02/16 0124    Nat Christen, MD 12/04/16 2041

## 2016-11-30 NOTE — ED Notes (Signed)
Pt was dressed upon entering room. Declined discharge vital signs.

## 2016-11-30 NOTE — Discharge Instructions (Signed)
Your lab work was reassuring. Your MRI showed no acute stroke.   Please schedule an appointment with your primary doctor to discuss your ER visit today and to evaluate for stroke risk factors. Your blood pressure was elevated, please also discuss this and have it rechecked with your primary doctor.  Please return to the emergency department if you have worsening vision loss, numbness, weakness or difficulty speaking.

## 2016-11-30 NOTE — ED Notes (Signed)
Pt and family anxious to leave.  Asking for time frame to go home.

## 2016-11-30 NOTE — ED Notes (Signed)
Pt and family frustrated with wait for neuro consult. Pt and family reassured, updated on process and encouraged to wait

## 2016-12-01 ENCOUNTER — Telehealth: Payer: Self-pay

## 2016-12-01 ENCOUNTER — Telehealth: Payer: Self-pay | Admitting: Internal Medicine

## 2016-12-01 ENCOUNTER — Ambulatory Visit: Payer: Self-pay

## 2016-12-01 NOTE — Telephone Encounter (Signed)
Patient staed she was advised in ER she had TIA, patient scheduled for Friday at 11.30 for ER follow up, MRI staed no acute infarct per MD note in ER visit. Patiet concerned if BP medication needs to be increased or reduced?

## 2016-12-01 NOTE — Telephone Encounter (Signed)
  Reason for Disposition . Many floaters in the eye  Answer Assessment - Initial Assessment Questions 1. DESCRIPTION: "What is the vision loss like? Describe it for me." (e.g., complete vision loss, blurred vision, double vision, floaters, etc.)     Right eye - vision loss 2. LOCATION: "One or both eyes?" If one, ask: "Which eye?"     Right eye 3. SEVERITY: "Can you see anything?" If so, ask: "What can you see?" (e.g., fine print)     Black areas 4. ONSET: "When did this begin?" "Did it start suddenly or has this been gradual?"     Thanksgiving day 5. PATTERN: "Does this come and go, or has it been constant since it started?"     Comes and goes 6. PAIN: "Is there any pain in your eye(s)?"  (Scale 1-10; or mild, moderate, severe)     No 7. CONTACTS-GLASSES: "Do you wear contacts or glasses?"     Glasses 8. CAUSE: "What do you think is causing this visual problem?"     A stroke in my eye 9. OTHER SYMPTOMS: "Do you have any other symptoms?" (e.g., confusion, headache, arm or leg weakness, speech problems)     No 10. PREGNANCY: "Is there any chance you are pregnant?" "When was your last menstrual period?"       No  Protocols used: [place] Pt. Reports has seen her eye doctor and went to [facility] ED yesterday. She was instructed to see her PCP today.

## 2016-12-01 NOTE — Telephone Encounter (Signed)
Tried to call patient line busy no voicemail.

## 2016-12-01 NOTE — Telephone Encounter (Signed)
Patient notified and voiced understanding.

## 2016-12-01 NOTE — Telephone Encounter (Signed)
Yes, increase amlodipine to 5 mg daily

## 2016-12-01 NOTE — Telephone Encounter (Signed)
Notified pt. Of Friday appointment.

## 2016-12-01 NOTE — Telephone Encounter (Signed)
Pt. Is adamant about knowing today if she should increase her blood pressure medicine before her Friday appointment. She states the ED doctor told her she needed to "ask her doctor today."    Copied from [place]. Topic: Quick Communication - See Telephone Encounter >> Dec 01, 2016 11:27 AM Hewitt Shorts wrote: CRM for notification. See Telephone encounter for: 12/01/16. Pt is stressing very much that she needs to know TODAY if Dr. Derrel Nip is wanting to know if she needs to know if the blood pressure meds needs to be increased or nothing not to change and she knows she has an appt  for Friday   Best number [phone]

## 2016-12-01 NOTE — Telephone Encounter (Signed)
NO open appointment s today have scheduled patient for FRiday at 11: for ER follow up. ER recommended patient follow up this week with PCP, patient has seen eye doctor and note describes as floaters from triage in right eye is ok for Friday.

## 2016-12-01 NOTE — Telephone Encounter (Signed)
Friday is fine.  ER did an MRI  No acute changes.  Labs reviewed and nothing urgent or new there as well

## 2016-12-01 NOTE — Telephone Encounter (Signed)
Copied from [place]. Topic: Quick Communication - See Telephone Encounter >> Dec 01, 2016 11:27 AM Hewitt Shorts wrote: CRM for notification. See Telephone encounter for: 12/01/16. Pt is stressing very much that she needs to know TODAY if Dr. Derrel Nip is wanting to know if she needs to know if the blood pressure meds needs to be increased or nothing not to change and she knows she has an appt  for Friday   Best number  [phone]

## 2016-12-01 NOTE — Telephone Encounter (Signed)
Patient notified to increase amlodipine to 5 mg  Patient agreed.

## 2016-12-03 DIAGNOSIS — H3411 Central retinal artery occlusion, right eye: Secondary | ICD-10-CM | POA: Diagnosis not present

## 2016-12-04 ENCOUNTER — Encounter: Payer: Self-pay | Admitting: Internal Medicine

## 2016-12-04 ENCOUNTER — Ambulatory Visit (INDEPENDENT_AMBULATORY_CARE_PROVIDER_SITE_OTHER): Payer: Medicare Other | Admitting: Internal Medicine

## 2016-12-04 DIAGNOSIS — E785 Hyperlipidemia, unspecified: Secondary | ICD-10-CM

## 2016-12-04 DIAGNOSIS — I10 Essential (primary) hypertension: Secondary | ICD-10-CM

## 2016-12-04 DIAGNOSIS — H34231 Retinal artery branch occlusion, right eye: Secondary | ICD-10-CM

## 2016-12-04 MED ORDER — ATORVASTATIN CALCIUM 20 MG PO TABS: 20.0000 mg | ORAL_TABLET | Freq: Every day | ORAL | 3 refills | Status: DC

## 2016-12-04 MED ORDER — ASPIRIN EC 81 MG PO TBEC: 81.0000 mg | DELAYED_RELEASE_TABLET | Freq: Every day | ORAL | 2 refills | Status: DC

## 2016-12-04 MED ORDER — AMLODIPINE BESYLATE 5 MG PO TABS: 2.5000 mg | ORAL_TABLET | Freq: Every day | ORAL | 1 refills | Status: DC

## 2016-12-04 NOTE — Progress Notes (Signed)
Subjective:  Patient ID: [name], female    DOB: [birth year]  Age: 83 y.o. 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  HPI [name] presents for  Follow up on uncontrolled hypertension.  Patient recently presented to her ophthalmologist  With sudden loss of vision in  right eye.  patient  Was diagnosed by Dr. Edison Pace,  With  a branch retinal artery occlusion   on Nov 27 2016.  She has been searing an eye patch over her right eye. Her vision is improving.  [name]    MRI brain was done, no acute infarct.  Previous right pontine infarct was seen.   Previous vascular workup included a carotid ultrasound  In October   Her blood pressure has been elevated and I increased dose to 5 mg of amlodipine Nov 27 .  Home readings today : 116/72 ,  Followed by 128/62  Yesterday 140/77   Outpatient Medications Prior to Visit  Medication Sig Dispense Refill  . diazepam (VALIUM) 5 MG tablet Take 1.5 tablets (7.5 mg total) by mouth every 8 (eight) hours as needed (insomnia). for anxiety (Patient taking differently: Take 5 mg by mouth at bedtime as needed (insomnia). for anxiety) 45 tablet 3  . docusate sodium (COLACE) 100 MG capsule Take 1 capsule (100 mg total) by mouth 2 (two) times daily. (Patient taking differently: Take 200 mg by mouth 2 (two) times daily. ) 60 capsule 11  . Garlic Oil 2 MG CAPS Take by mouth.    . hydrOXYzine (ATARAX/VISTARIL) 25 MG tablet TAKE ONE CAPSULE BY MOUTH THREE TIMES DAILY AS NEEDED 90 tablet 1  . Iron-Vitamins (GERITOL PO) Take 1 tablet by mouth daily.    [name] lactulose (CHRONULAC) 10 GM/15ML solution TAKE 30MLS(0NE OUNCE) BY MOUTH 2 TIMES DAILY AS NEEDED FOR MILD CONSTIPATION. DO NOT USE MORE THAN ONCE A WEEK 240 mL 2  . Lutein 20 MG CAPS Take 20 mg by mouth daily.      . magnesium oxide (MAG-OX) 400 MG tablet Take 400 mg by mouth.    . Potassium  Gluconate 2 MEQ TABS Take by mouth.    . propranolol ER (INDERAL LA) 60 MG 24 hr capsule TAKE ONE CAPSULE BY MOUTH AT BEDTIME (Patient taking differently: TAKE ONE CAPSULE BY MOUTH AT BEDTIME EVERY OTHER DAY) 30 capsule 3  . traMADol (ULTRAM) 50 MG tablet Take 1 tablet (50 mg total) every 8 (eight) hours as needed by mouth. 90 tablet 2  . VITAMIN E PO Take 400 Units by mouth daily.     [name] amLODipine (NORVASC) 2.5 MG tablet TAKE ONE TABLET BY MOUTH EVERY DAY 90 tablet 1   No facility-administered medications prior to visit.     Review of Systems;  Patient denies headache, fevers, malaise, unintentional weight loss, skin rash, eye pain, sinus congestion and sinus pain, sore throat, dysphagia,  hemoptysis , cough, dyspnea, wheezing, chest pain, palpitations, orthopnea, edema, abdominal pain, nausea, melena, diarrhea, constipation, flank pain, dysuria, hematuria, urinary  Frequency, nocturia, numbness, tingling, seizures,  Focal weakness, Loss of consciousness,  Tremor, insomnia, depression, anxiety, and suicidal ideation.      Objective:  BP (!) 168/86 (BP Location: Left Arm, Patient Position: Sitting, Cuff Size: Normal)   Pulse 61   Temp 97.8 F (36.6 C) (Oral)   Resp 16   Ht 5\' 7"  (1.702 m)   Wt 131  lb (59.4 kg)   SpO2 97%   BMI 20.52 kg/m   BP Readings from Last 3 Encounters:  12/04/16 (!) 168/86  11/30/16 (!) 168/68  11/16/16 (!) 183/79    Wt Readings from Last 3 Encounters:  12/04/16 131 lb (59.4 kg)  11/16/16 130 lb (59 kg)  07/24/16 128 lb 6.4 oz (58.2 kg)    General appearance: alert, cooperative and appears stated age Ears: normal TM's and external ear canals both ears Throat: lips, mucosa, and tongue normal; teeth and gums normal Neck: no adenopathy, no carotid bruit, supple, symmetrical, trachea midline and thyroid not enlarged, symmetric, no tenderness/mass/nodules Back: symmetric, no curvature. ROM normal. No CVA tenderness. Lungs: clear to auscultation  bilaterally Heart: regular rate and rhythm, S1, S2 normal, no murmur, click, rub or gallop Abdomen: soft, non-tender; bowel sounds normal; no masses,  no organomegaly Pulses: 2+ and symmetric Skin: Skin color, texture, turgor normal. No rashes or lesions Lymph nodes: Cervical, supraclavicular, and axillary nodes normal.  No results found for: HGBA1C  Lab Results  Component Value Date   CREATININE 0.90 11/30/2016   CREATININE 0.82 11/30/2016   CREATININE 0.81 06/09/2016    Lab Results  Component Value Date   WBC 6.1 11/30/2016   HGB 12.2 11/30/2016   HCT 36.0 11/30/2016   PLT 294 11/30/2016   GLUCOSE 97 11/30/2016   CHOL 254 (H) 06/09/2016   TRIG 103.0 06/09/2016   HDL 60.00 06/09/2016   LDLDIRECT 145.6 12/21/2012   LDLCALC 174 (H) 06/09/2016   ALT 12 (L) 11/30/2016   AST 24 11/30/2016   NA 134 (L) 11/30/2016   K 4.2 11/30/2016   CL 100 (L) 11/30/2016   CREATININE 0.90 11/30/2016   BUN 11 11/30/2016   CO2 26 11/30/2016   TSH 1.92 01/04/2015   INR 0.99 11/30/2016    Mr Brain Wo Contrast  Result Date: 11/30/2016 CLINICAL DATA:  83 y/o F; intermittent vision loss of the right eye. History of hypertension. EXAM: MRI HEAD WITHOUT CONTRAST TECHNIQUE: Multiplanar, multiecho pulse sequences of the brain and surrounding structures were obtained without intravenous contrast. COMPARISON:  07/29/2013 MRI of the head FINDINGS: Brain: No acute infarction, hemorrhage, hydrocephalus, extra-axial collection or mass lesion. Interval small chronic lacunar infarcts are present within the right M2 putamen and caudate head. Stable chronic right paramedian infarct of the right pons with volume loss. Mild interval progression of chronic microvascular ischemic changes of white matter. Stable brain parenchymal volume loss. Vascular: Normal flow voids. Skull and upper cervical spine: Calvarial lesion identified. C3-4 disc protrusion again seen with anterior cord impingement. Sinuses/Orbits: Negative.  Other: Bilateral intra-ocular lens replacement. IMPRESSION: 1. No acute intracranial abnormality. 2. Interval chronic lacunar infarcts within right anterior putamen and caudate head. Stable chronic right paramedian pontine infarct. 3. Mild progression of chronic microvascular ischemic changes. Stable mild parenchymal volume loss for age. Electronically Signed   By: Kristine Garbe M.D.   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  Lab Results  Component Value Date   CHOL 254 (H) 06/09/2016   HDL 60.00 06/09/2016   LDLCALC 174 (H) 06/09/2016   LDLDIRECT 145.6 12/21/2012   TRIG 103.0 06/09/2016   CHOLHDL 4 06/09/2016          Relevant Medications  amLODipine (NORVASC) 5 MG tablet   aspirin EC 81 MG tablet   atorvastatin (LIPITOR) 20 MG tablet   Essential hypertension    Improving control with increase in amlodipine to 5 mg daily .    Lab Results  Component Value Date   CREATININE 0.90 11/30/2016   Lab Results  Component Value Date   NA 134 (L) 11/30/2016   K 4.2 11/30/2016   CL 100 (L) 11/30/2016   CO2 26 11/30/2016         Relevant Medications   amLODipine (NORVASC) 5 MG tablet   aspirin EC 81 MG tablet   atorvastatin (LIPITOR) 20 MG tablet   Hyperlipidemia LDL goal <130    No prior treatment due to age,  But given her recent TIA statin therapy is advised.  lipitor , generic started  With repeat liver enzymes in 3  weeks       Relevant Medications   amLODipine (NORVASC) 5 MG tablet   aspirin EC 81 MG tablet   atorvastatin (LIPITOR) 20 MG tablet     A total of 25 minutes of face to face time was spent with patient more than half of which was spent in counselling about the above mentioned conditions  and coordination of care  I have changed [name]'s  amLODipine. I am also having her start on aspirin EC and atorvastatin. Additionally, I am having her maintain her Lutein, docusate sodium, VITAMIN E PO, lactulose, propranolol ER, Garlic Oil, magnesium oxide, Potassium Gluconate, Iron-Vitamins (GERITOL PO), hydrOXYzine, diazepam, and traMADol.  Meds ordered this encounter  Medications  . amLODipine (NORVASC) 5 MG tablet    Sig: Take 0.5 tablets (2.5 mg total) by mouth daily.    Dispense:  90 tablet    Refill:  1  . aspirin EC 81 MG tablet    Sig: Take 1 tablet (81 mg total) by mouth daily.    Dispense:  90 tablet    Refill:  2  . atorvastatin (LIPITOR) 20 MG tablet    Sig: Take 1 tablet (20 mg total) by mouth daily.    Dispense:  90 tablet    Refill:  3    Medications Discontinued During This Encounter  Medication Reason  . amLODipine (NORVASC) 2.5 MG tablet     Follow-up: No Follow-up on file.   Crecencio Mc, MD

## 2016-12-04 NOTE — Patient Instructions (Addendum)
You can stop the potassium and the garlic and continue everything else  Including Geritol   Continue amlodipine 5 mg daily for your blood pressure  Check your blood pressure 2 times daily (morning and evening ) .  Take an extra dose of amlodipine in the evening if your readings are always > 140  I want you to start taking a baby aspirin . At least twice a week .  To prevent strokes   I also want you to start a low dose of Lipitor (atorvastatin) for the stabilizing effect on placque   We will check your liver enzymes when you return

## 2016-12-06 DIAGNOSIS — H34231 Retinal artery branch occlusion, right eye: Secondary | ICD-10-CM | POA: Insufficient documentation

## 2016-12-06 NOTE — Assessment & Plan Note (Signed)
Improving control with increase in amlodipine to 5 mg daily .    Lab Results  Component Value Date   CREATININE 0.90 11/30/2016   Lab Results  Component Value Date   NA 134 (L) 11/30/2016   K 4.2 11/30/2016   CL 100 (L) 11/30/2016   CO2 26 11/30/2016

## 2016-12-06 NOTE — Assessment & Plan Note (Addendum)
Advised to consider starting a baby aspirin and statin given this episodes which is considered a TIA.  Lab Results  Component Value Date   CHOL 254 (H) 06/09/2016   HDL 60.00 06/09/2016   LDLCALC 174 (H) 06/09/2016   LDLDIRECT 145.6 12/21/2012   TRIG 103.0 06/09/2016   CHOLHDL 4 06/09/2016

## 2016-12-06 NOTE — Assessment & Plan Note (Addendum)
No prior treatment due to age,  But given her recent TIA statin therapy is advised.  lipitor , generic started  With repeat liver enzymes in 3  weeks

## 2016-12-16 ENCOUNTER — Telehealth: Payer: Self-pay

## 2016-12-16 ENCOUNTER — Other Ambulatory Visit: Payer: Self-pay | Admitting: Internal Medicine

## 2016-12-16 ENCOUNTER — Encounter: Payer: Self-pay | Admitting: Internal Medicine

## 2016-12-16 ENCOUNTER — Ambulatory Visit (INDEPENDENT_AMBULATORY_CARE_PROVIDER_SITE_OTHER): Payer: Medicare Other | Admitting: Internal Medicine

## 2016-12-16 VITALS — BP 156/78 | HR 60 | Temp 97.9°F | Resp 15 | Ht 67.0 in | Wt 131.0 lb

## 2016-12-16 DIAGNOSIS — R350 Frequency of micturition: Secondary | ICD-10-CM

## 2016-12-16 DIAGNOSIS — F5104 Psychophysiologic insomnia: Secondary | ICD-10-CM

## 2016-12-16 DIAGNOSIS — Z1239 Encounter for other screening for malignant neoplasm of breast: Secondary | ICD-10-CM

## 2016-12-16 DIAGNOSIS — Z0000 Encounter for general adult medical examination without abnormal findings: Secondary | ICD-10-CM

## 2016-12-16 DIAGNOSIS — E785 Hyperlipidemia, unspecified: Secondary | ICD-10-CM

## 2016-12-16 DIAGNOSIS — Z1231 Encounter for screening mammogram for malignant neoplasm of breast: Secondary | ICD-10-CM | POA: Diagnosis not present

## 2016-12-16 DIAGNOSIS — Z0001 Encounter for general adult medical examination with abnormal findings: Secondary | ICD-10-CM | POA: Diagnosis not present

## 2016-12-16 DIAGNOSIS — I10 Essential (primary) hypertension: Secondary | ICD-10-CM

## 2016-12-16 LAB — URINALYSIS, MICROSCOPIC ONLY: RBC / HPF: NONE SEEN (ref 0–?)

## 2016-12-16 LAB — POCT URINALYSIS DIPSTICK
Bilirubin, UA: NEGATIVE
Blood, UA: NEGATIVE
Glucose, UA: NEGATIVE
Ketones, UA: NEGATIVE
Leukocytes, UA: NEGATIVE
Nitrite, UA: NEGATIVE
Protein, UA: NEGATIVE
Spec Grav, UA: 1.01 (ref 1.010–1.025)
Urobilinogen, UA: 0.2 E.U./dL
pH, UA: 7 (ref 5.0–8.0)

## 2016-12-16 MED ORDER — PRAVASTATIN SODIUM 20 MG PO TABS: 20.0000 mg | ORAL_TABLET | Freq: Every day | ORAL | 3 refills | Status: DC

## 2016-12-16 MED ORDER — DIAZEPAM 5 MG PO TABS: 7.5000 mg | ORAL_TABLET | Freq: Every evening | ORAL | 0 refills | Status: DC | PRN

## 2016-12-16 MED ORDER — PRAVASTATIN SODIUM 20 MG PO TABS: 20.0000 mg | ORAL_TABLET | Freq: Every day | ORAL | Status: DC

## 2016-12-16 MED ORDER — PROPRANOLOL HCL ER 60 MG PO CP24: 60.0000 mg | ORAL_CAPSULE | Freq: Every day | ORAL | 3 refills | Status: DC

## 2016-12-16 NOTE — Telephone Encounter (Signed)
Copied from [place]. Topic: General - Other >> Dec 16, 2016  3:14 PM Valla Leaver wrote: Reason for CRM: Patient says Dr. Derrel Nip told her to call to speak with Kerin Salen RE kidney infection. Please call back.

## 2016-12-16 NOTE — Assessment & Plan Note (Signed)
She is requesting testing for UTI.

## 2016-12-16 NOTE — Patient Instructions (Addendum)
For your insomnia:  You can increase the diazepam (valium ) to 1.5 tablets at bedtime (7.5 mg )   You should NOT take clozepate  With this medication .  It is dangerous to combine them .  Do not increase your dose any further without my permission .  Call the office and ask for Kerin Salen  Or Adair Laundry    Regarding your bladder:  If you have frequency  Of urination or burning,  You can  Drop off a urine specimen in a sterile lab container.  For your blood pressure:  You can increase your propanolol to nightly instead of every other night.  This will help your blood pressure as well as your tremor.    If your BP is not 130/80 after 2 weeks ,  Let me know and we will resume every other day day dosing,  But we will need to increase your amlodipine instead     To prevent stroke:  If you are willing to try a weaker cholesterol medication ,  I have called in pravastatin to take in the evening at bedtime.

## 2016-12-16 NOTE — Progress Notes (Signed)
Patient ID: [name], female    DOB: [birth year]  Age: 83 y.o. MRN: [id number]  The patient is here for annual preventive examination and management of other chronic and acute problems.   DEXA DONE 2017 TDAP OVERDUE  PNEUMONIA VACCINE DECLINED INFLUENZA DECLINED MAMMOGRAM DECLINED    The risk factors are reflected in the social history.  The roster of all physicians providing medical care to patient - is listed in the Snapshot section of the chart.  Activities of daily living:  The patient is 100% independent in all ADLs: dressing, toileting, feeding as well as independent mobility  Home safety : The patient has smoke detectors in the home. They wear seatbelts.  There are no firearms at home. There is no violence in the home.   There is no risks for hepatitis, STDs or HIV. There is no   history of blood transfusion. They have no travel history to infectious disease endemic areas of the world.  The patient has seen their dentist in the last six month. They have seen their eye doctor in the last year. They admit to slight hearing difficulty with regard to whispered voices and some television programs.  They have deferred audiologic testing in the last year.  They do not  have excessive sun exposure. Discussed the need for sun protection: hats, long sleeves and use of sunscreen if there is significant sun exposure.   Diet: the importance of a healthy diet is discussed. They do have a healthy diet.  The benefits of regular aerobic exercise were discussed. She walks 4 times per week ,  20 minutes.   Depression screen: there are no signs or vegative symptoms of depression- irritability, change in appetite, anhedonia, sadness/tearfullness.  Cognitive assessment: the patient manages all their financial and personal affairs and is actively engaged. They could relate day,date,year and events; recalled 2/3 objects at 3 minutes; performed clock-face test normally.  The following portions of  the patient's history were reviewed and updated as appropriate: allergies, current medications, past family history, past medical history,  past surgical history, past social history  and problem list.  Visual acuity was not assessed per patient preference since she has regular follow up with her ophthalmologist. Hearing and body mass index were assessed and reviewed.   During the course of the visit the patient was educated and counseled about appropriate screening and preventive services including : fall prevention , diabetes screening, nutrition counseling, colorectal cancer screening, and recommended immunizations.    CC: The primary encounter diagnosis was Encounter for preventive health examination. Diagnoses of Hyperlipidemia LDL goal <130, Urinary frequency, Essential hypertension, Psychophysiological insomnia, and Screening for breast cancer were also pertinent to this visit.  Recent TIA:  She was seen two weeks ago and statin was prescribed.  She stopped the lipitor after 3 doses because it made her feel so  "bad" she didn't want to get out of bed    Insomnia:  Waking up at 1:00 am to void and then not able to return to sleep .  She is Using 5 mg valium,  Wants to increase dose  To 10 mg   Stopped taking clozapate a while ago.     History [name] has a past medical history of Anxiety, Back pain, Chronic orthostatic hypotension (June 2012), Depression, Hyperlipidemia, and Hypertension.   She has a past surgical history that includes Ankle surgery (Left); Wrist surgery (Right); and Cataract extraction, bilateral.   Her family history is not on file.She reports that  has never smoked. she has never used smokeless tobacco. She reports that she does not drink alcohol or use drugs.  Outpatient Medications Prior to Visit  Medication Sig Dispense Refill  . amLODipine (NORVASC) 5 MG tablet Take 0.5 tablets (2.5 mg total) by mouth daily. 90 tablet 1  . aspirin EC 81 MG tablet Take 1 tablet (81  mg total) by mouth daily. 90 tablet 2  . docusate sodium (COLACE) 100 MG capsule Take 1 capsule (100 mg total) by mouth 2 (two) times daily. (Patient taking differently: Take 200 mg by mouth 2 (two) times daily. ) 60 capsule 11  . Garlic Oil 2 MG CAPS Take by mouth.    . Iron-Vitamins (GERITOL PO) Take 1 tablet by mouth daily.    [name] lactulose (CHRONULAC) 10 GM/15ML solution TAKE 30MLS(0NE OUNCE) BY MOUTH 2 TIMES DAILY AS NEEDED FOR MILD CONSTIPATION. DO NOT USE MORE THAN ONCE A WEEK 240 mL 2  . Lutein 20 MG CAPS Take 20 mg by mouth daily.      . magnesium oxide (MAG-OX) 400 MG tablet Take 400 mg by mouth.    . Potassium Gluconate 2 MEQ TABS Take by mouth.    [name] VITAMIN E PO Take 400 Units by mouth daily.     . diazepam (VALIUM) 5 MG tablet Take 1.5 tablets (7.5 mg total) by mouth every 8 (eight) hours as needed (insomnia). for anxiety (Patient taking differently: Take 5 mg by mouth at bedtime as needed (insomnia). for anxiety) 45 tablet 3  . propranolol ER (INDERAL LA) 60 MG 24 hr capsule TAKE ONE CAPSULE BY MOUTH AT BEDTIME (Patient taking differently: TAKE ONE CAPSULE BY MOUTH AT BEDTIME EVERY OTHER DAY) 30 capsule 3  . hydrOXYzine (ATARAX/VISTARIL) 25 MG tablet TAKE ONE CAPSULE BY MOUTH THREE TIMES DAILY AS NEEDED (Patient not taking: Reported on 12/16/2016) 90 tablet 1  . traMADol (ULTRAM) 50 MG tablet Take 1 tablet (50 mg total) every 8 (eight) hours as needed by mouth. (Patient not taking: Reported on 12/16/2016) 90 tablet 2  . atorvastatin (LIPITOR) 20 MG tablet Take 1 tablet (20 mg total) by mouth daily. (Patient not taking: Reported on 12/16/2016) 90 tablet 3   No facility-administered medications prior to visit.     Review of Systems   Patient denies headache, fevers, malaise, unintentional weight loss, skin rash, eye pain, sinus congestion and sinus pain, sore throat, dysphagia,  hemoptysis , cough, dyspnea, wheezing, chest pain, palpitations, orthopnea, edema, abdominal pain, nausea,  melena, diarrhea, constipation, flank pain, dysuria, hematuria, urinary  Frequency, nocturia, numbness, tingling, seizures,  Focal weakness, Loss of consciousness,  Tremor, insomnia, depression, anxiety, and suicidal ideation.     Objective:  BP (!) 156/78 (BP Location: Left Arm, Patient Position: Sitting, Cuff Size: Normal)   Pulse 60   Temp 97.9 F (36.6 C) (Oral)   Resp 15   Ht 5\' 7"  (1.702 m)   Wt 131 lb (59.4 kg)   SpO2 97%   BMI 20.52 kg/m   Physical Exam   General appearance: alert, cooperative and appears stated age Ears: normal TM's and external ear canals both ears Throat: lips, mucosa, and tongue normal; teeth and gums normal Neck: no adenopathy, no carotid bruit, supple, symmetrical, trachea midline and thyroid not enlarged, symmetric, no tenderness/mass/nodules Back: symmetric, no curvature. ROM normal. No CVA tenderness. Lungs: clear to auscultation bilaterally Heart: regular rate and rhythm, S1, S2 normal, no murmur, click, rub or gallop Abdomen: soft, non-tender; bowel sounds normal; no masses,  no organomegaly Pulses: 2+ and symmetric Skin: Skin color, texture, turgor normal. No rashes or lesions Lymph nodes: Cervical, supraclavicular, and axillary nodes normal.    Assessment & Plan:   Problem List Items Addressed This Visit    Encounter for preventive health examination - Primary    Annual comprehensive preventive exam was done as well as an evaluation and management of chronic conditions .  During the course of the visit the patient was educated and counseled about appropriate screening and preventive services including :  diabetes screening, lipid analysis with projected  10 year  risk for CAD , nutrition counseling, breast, cervical and colorectal cancer screening, and recommended immunizations.  Printed recommendations for health maintenance screenings was give      Essential hypertension    Home readings have been labile.  She is taking propranolol ER  every other night for management of tremor.  Advised her to take it nightly, and continue amlodipine 5 mg daily       Relevant Medications   pravastatin (PRAVACHOL) 20 MG tablet   propranolol ER (INDERAL LA) 60 MG 24 hr capsule   Hyperlipidemia LDL goal <130    She did not tolerate atorvastatin but is willing to try pravastatin .      Relevant Medications   pravastatin (PRAVACHOL) 20 MG tablet   propranolol ER (INDERAL LA) 60 MG 24 hr capsule   Insomnia    Chronic, despite using 5 mg valium daily  Increase to 7.5 mg daily       Screening for breast cancer    She has deferred any further screening.        Urinary frequency    She is requesting testing for UTI.       Relevant Orders   POCT Urinalysis Dipstick (Completed)   Urine Culture   Urine Microscopic Only (Completed)      I have discontinued [name]'s atorvastatin and pravastatin. I have also changed her diazepam and propranolol ER. Additionally, I am having her start on pravastatin. Lastly, I am having her maintain her Lutein, docusate sodium, VITAMIN E PO, lactulose, Garlic Oil, magnesium oxide, Potassium Gluconate, Iron-Vitamins (GERITOL PO), hydrOXYzine, traMADol, amLODipine, and aspirin EC.  Meds ordered this encounter  Medications  . pravastatin (PRAVACHOL) 20 MG tablet    Sig: Take 1 tablet (20 mg total) by mouth daily.    Dispense:  30 tablet    Refill:  03  . diazepam (VALIUM) 5 MG tablet    Sig: Take 1.5 tablets (7.5 mg total) by mouth at bedtime as needed (insomnia). for anxiety    Dispense:  45 tablet    Refill:  0  . propranolol ER (INDERAL LA) 60 MG 24 hr capsule    Sig: Take 1 capsule (60 mg total) by mouth at bedtime.    Dispense:  30 capsule    Refill:  3    This prescription was filled on 04/06/2016. Any refills authorized will be placed on file.  [name] DISCONTD: pravastatin (PRAVACHOL) 20 MG tablet    Sig: Take 1 tablet (20 mg total) by mouth daily.    Dispense:  90 tablet    Refill:  3     Medications Discontinued During This Encounter  Medication Reason  . atorvastatin (LIPITOR) 20 MG tablet Patient Preference  . diazepam (VALIUM) 5 MG tablet   . propranolol ER (INDERAL LA) 60 MG 24 hr capsule   . pravastatin (PRAVACHOL) 20 MG tablet     Follow-up: Return  in about 3 months (around 03/16/2017).   Crecencio Mc, MD

## 2016-12-16 NOTE — Assessment & Plan Note (Signed)
Chronic, despite using 5 mg valium daily  Increase to 7.5 mg daily

## 2016-12-16 NOTE — Assessment & Plan Note (Signed)
Annual comprehensive preventive exam was done as well as an evaluation and management of chronic conditions .  During the course of the visit the patient was educated and counseled about appropriate screening and preventive services including :  diabetes screening, lipid analysis with projected  10 year  risk for CAD , nutrition counseling, breast, cervical and colorectal cancer screening, and recommended immunizations.  Printed recommendations for health maintenance screenings was give 

## 2016-12-16 NOTE — Assessment & Plan Note (Signed)
Home readings have been labile.  She is taking propranolol ER every other night for management of tremor.  Advised her to take it nightly, and continue amlodipine 5 mg daily

## 2016-12-16 NOTE — Assessment & Plan Note (Signed)
She has deferred any further screening.

## 2016-12-16 NOTE — Assessment & Plan Note (Signed)
She did not tolerate atorvastatin but is willing to try pravastatin .

## 2016-12-17 LAB — URINE CULTURE
MICRO NUMBER:: 81396983
Result:: NO GROWTH
SPECIMEN QUALITY:: ADEQUATE

## 2016-12-17 NOTE — Telephone Encounter (Signed)
Tried to reach patient  Line busy no voicemail.

## 2016-12-22 ENCOUNTER — Telehealth: Payer: Self-pay | Admitting: Internal Medicine

## 2016-12-22 NOTE — Telephone Encounter (Signed)
See result note.  

## 2016-12-22 NOTE — Telephone Encounter (Signed)
Copied from [place]. Topic: Quick Communication - See Telephone Encounter >> Dec 22, 2016 10:09 AM Cleaster Corin, NT wrote: CRM for notification. See Telephone encounter for:   12/22/16. Pt. Calling to speak with Kerin Salen about her blood pressure med. Amlodipine. Pt can be reached at [phone]

## 2016-12-22 NOTE — Telephone Encounter (Signed)
Pt calling back regarding this issue. Please contact pt

## 2016-12-22 NOTE — Telephone Encounter (Signed)
Please advise 

## 2016-12-22 NOTE — Telephone Encounter (Signed)
Hugo calling and states the pt was previously on 2.5mg  amlodipine daily, then on 11/30 the rx was sent for a 5mg  tablet but for pt to half the tablet and still take 2.5mg . Pt was confused and thought she was to take 1 whole tablet a day making that 5mg /day and is now out of the medicine. The pharmacy and patient needs clarification on what she is suppose to be taking. If it is suppose to be 5mg /per day please send in new rx to pharmacy. CB#: [id number]

## 2016-12-23 MED ORDER — AMLODIPINE BESYLATE 5 MG PO TABS: 5.0000 mg | ORAL_TABLET | Freq: Every day | ORAL | 1 refills | Status: DC

## 2016-12-23 NOTE — Telephone Encounter (Signed)
Looking at the pt's last two office notes from Dr. Derrel Nip the pt is supposed to be taking amlodipine 5mg  daily. Called both the pharmacy and the pt to let them know of the correct does. Also sent in a new rx.

## 2017-01-11 DIAGNOSIS — H3411 Central retinal artery occlusion, right eye: Secondary | ICD-10-CM | POA: Diagnosis not present

## 2017-01-26 ENCOUNTER — Ambulatory Visit (INDEPENDENT_AMBULATORY_CARE_PROVIDER_SITE_OTHER): Payer: Medicare Other | Admitting: Internal Medicine

## 2017-01-26 ENCOUNTER — Encounter: Payer: Self-pay | Admitting: Internal Medicine

## 2017-01-26 VITALS — BP 150/68 | HR 60 | Temp 98.0°F | Resp 15 | Ht 67.0 in | Wt 131.2 lb

## 2017-01-26 DIAGNOSIS — I10 Essential (primary) hypertension: Secondary | ICD-10-CM

## 2017-01-26 DIAGNOSIS — H34231 Retinal artery branch occlusion, right eye: Secondary | ICD-10-CM | POA: Diagnosis not present

## 2017-01-26 DIAGNOSIS — I7300 Raynaud's syndrome without gangrene: Secondary | ICD-10-CM

## 2017-01-26 DIAGNOSIS — M5137 Other intervertebral disc degeneration, lumbosacral region: Secondary | ICD-10-CM

## 2017-01-26 NOTE — Progress Notes (Signed)
Subjective:  Patient ID: [name], female    DOB: 06/20/1926  Age: 84 y.o. MRN: [id number]  CC: The primary encounter diagnosis was Degeneration of lumbar or lumbosacral intervertebral disc. Diagnoses of Essential hypertension, 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, and Raynaud's phenomenon without gangrene were also pertinent to this visit.  HPI [name] presents for follow up on hypertension .  Amlodipine was  increased to 5 mg daily last month.  Patient states that her pressures have not improved.  However , review of her home readings note that most Home radings are  < 140/80  Back pain.  Chronic left iliac crest .  Convinced it his from her bowels.  No relief with tramadol , tylenol \  Continues to question why her Right index finger turns numb and cold at times. Symptoms are transient,  Chronic   Vision loss due to retinal artery occlusion   Right  Eye.  vision comes and goes  Seeing Dr [name] and Dr [name]  Coming back more in the peripheral.  Sees eye doctor in February .  Considering goint to see a specialist [name] in [place] a neuro ophthalmologist .  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 amLODipine (NORVASC) 5 MG tablet Take 1 tablet (5 mg total) by mouth daily. 90 tablet 1  . aspirin EC 81 MG tablet Take 1 tablet (81 mg total) by mouth daily. 90 tablet 2  . diazepam (VALIUM) 5 MG tablet Take 1.5 tablets (7.5 mg total) by mouth at bedtime as needed (insomnia). for anxiety 45 tablet 0  . docusate sodium (COLACE) 100 MG capsule Take 1 capsule (100 mg total) by mouth 2 (two) times daily. (Patient taking differently: Take 200 mg by mouth 2 (two) times daily. ) 60 capsule 11  . Garlic Oil 2 MG CAPS Take by mouth.    . hydrOXYzine (ATARAX/VISTARIL) 25 MG tablet TAKE ONE CAPSULE BY MOUTH THREE TIMES DAILY AS NEEDED 90 tablet 1  . Iron-Vitamins (GERITOL  PO) Take 1 tablet by mouth daily.    [name] lactulose (CHRONULAC) 10 GM/15ML solution TAKE 30MLS(0NE OUNCE) BY MOUTH 2 TIMES DAILY AS NEEDED FOR MILD CONSTIPATION. DO NOT USE MORE THAN ONCE A WEEK 240 mL 2  . Lutein 20 MG CAPS Take 20 mg by mouth daily.      . magnesium oxide (MAG-OX) 400 MG tablet Take 400 mg by mouth.    . Potassium Gluconate 2 MEQ TABS Take by mouth.    . pravastatin (PRAVACHOL) 20 MG tablet Take 1 tablet (20 mg total) by mouth daily. 30 tablet 03  . propranolol ER (INDERAL LA) 60 MG 24 hr capsule Take 1 capsule (60 mg total) by mouth at bedtime. 30 capsule 3  . traMADol (ULTRAM) 50 MG tablet Take 1 tablet (50 mg total) every 8 (eight) hours as needed by mouth. 90 tablet 2  . VITAMIN E PO Take 400 Units by mouth daily.      No facility-administered medications prior to visit.     Review of Systems;  Patient denies headache, fevers, malaise, unintentional weight loss, skin rash, eye pain, sinus congestion and sinus pain, sore throat, dysphagia,  hemoptysis , cough, dyspnea, wheezing, chest pain, palpitations, orthopnea, edema, abdominal pain, nausea, melena, diarrhea, constipation, flank pain, dysuria, hematuria, urinary  Frequency, nocturia, numbness, tingling, seizures,  Focal weakness,  Loss of consciousness,  Tremor, insomnia, depression, anxiety, and suicidal ideation.      Objective:  BP (!) 150/68 (BP Location: Left Arm, Patient Position: Sitting, Cuff Size: Normal)   Pulse 60   Temp 98 F (36.7 C) (Oral)   Resp 15   Ht 5\' 7"  (1.702 m)   Wt 131 lb 3.2 oz (59.5 kg)   SpO2 99%   BMI 20.55 kg/m   BP Readings from Last 3 Encounters:  01/26/17 (!) 150/68  12/16/16 (!) 156/78  12/04/16 (!) 168/86    Wt Readings from Last 3 Encounters:  01/26/17 131 lb 3.2 oz (59.5 kg)  12/16/16 131 lb (59.4 kg)  12/04/16 131 lb (59.4 kg)    General appearance: alert, cooperative and appears stated age Ears: normal TM's and external ear canals both ears Throat: lips, mucosa,  and tongue normal; teeth and gums normal Neck: no adenopathy, no carotid bruit, supple, symmetrical, trachea midline and thyroid not enlarged, symmetric, no tenderness/mass/nodules Back: symmetric, no curvature. ROM normal. No CVA tenderness. Lungs: clear to auscultation bilaterally Heart: regular rate and rhythm, S1, S2 normal, no murmur, click, rub or gallop Abdomen: soft, non-tender; bowel sounds normal; no masses,  no organomegaly Pulses: 2+ and symmetric Skin: Skin color, texture, turgor normal. No rashes or lesions Lymph nodes: Cervical, supraclavicular, and axillary nodes normal.  No results found for: HGBA1C  Lab Results  Component Value Date   CREATININE 0.90 11/30/2016   CREATININE 0.82 11/30/2016   CREATININE 0.81 06/09/2016    Lab Results  Component Value Date   WBC 6.1 11/30/2016   HGB 12.2 11/30/2016   HCT 36.0 11/30/2016   PLT 294 11/30/2016   GLUCOSE 97 11/30/2016   CHOL 254 (H) 06/09/2016   TRIG 103.0 06/09/2016   HDL 60.00 06/09/2016   LDLDIRECT 145.6 12/21/2012   LDLCALC 174 (H) 06/09/2016   ALT 12 (L) 11/30/2016   AST 24 11/30/2016   NA 134 (L) 11/30/2016   K 4.2 11/30/2016   CL 100 (L) 11/30/2016   CREATININE 0.90 11/30/2016   BUN 11 11/30/2016   CO2 26 11/30/2016   TSH 1.92 01/04/2015   INR 0.99 11/30/2016    [name] Contrast  Result Date: 11/30/2016 CLINICAL DATA:  84 y/o F; intermittent vision loss of the right eye. History of hypertension. EXAM: MRI HEAD WITHOUT CONTRAST TECHNIQUE: Multiplanar, multiecho pulse sequences of the brain and surrounding structures were obtained without intravenous contrast. COMPARISON:  07/29/2013 MRI of the head FINDINGS: Brain: No acute infarction, hemorrhage, hydrocephalus, extra-axial collection or mass lesion. Interval small chronic lacunar infarcts are present within the right M2 putamen and caudate head. Stable chronic right paramedian infarct of the right pons with volume loss. Mild interval progression of  chronic microvascular ischemic changes of white matter. Stable brain parenchymal volume loss. Vascular: Normal flow voids. Skull and upper cervical spine: Calvarial lesion identified. C3-4 disc protrusion again seen with anterior cord impingement. Sinuses/Orbits: Negative. Other: Bilateral intra-ocular lens replacement. IMPRESSION: 1. No acute intracranial abnormality. 2. Interval chronic lacunar infarcts within right anterior putamen and caudate head. Stable chronic right paramedian pontine infarct. 3. Mild progression of chronic microvascular ischemic changes. Stable mild parenchymal volume loss for age. Electronically Signed   By: Kristine Garbe M.D.   On: 11/30/2016 16:39    Assessment & Plan:   Problem List Items Addressed This Visit    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    She is tolerating  a baby aspirin and statin  now.   Lab Results  Component Value Date   CHOL 254 (H) 06/09/2016   HDL 60.00 06/09/2016   LDLCALC 174 (H) 06/09/2016   LDLDIRECT 145.6 12/21/2012   TRIG 103.0 06/09/2016   CHOLHDL 4 06/09/2016          Degeneration of lumbar or lumbosacral intervertebral disc - Primary    Multi level DDD by MRI Feb 2013, no high grade stenosis or nerve root irritation.  Intolerant of most medications,  Recommended use of TENS unit      Essential hypertension    Well controlled on current regimen. Renal function stable, no changes today.  Lab Results  Component Value Date   CREATININE 0.90 11/30/2016   Lab Results  Component Value Date   NA 134 (L) 11/30/2016   K 4.2 11/30/2016   CL 100 (L) 11/30/2016   CO2 26 11/30/2016         Raynaud phenomenon    Chronic, stable , managed with salt loading.  Weight gain addressed and reassurance provided that she has no signg of volume overload or overweight.    Lab Results  Component Value Date   NA 134 (L) 11/30/2016   K 4.2 11/30/2016   CL 100 (L) 11/30/2016   CO2 26 11/30/2016          A total of  25 minutes of face to face time was spent with patient more than half of which was spent in counselling and coordination of care    I am having [facility]beck maintain her Lutein, docusate sodium, VITAMIN E PO, lactulose, Garlic Oil, magnesium oxide, Potassium Gluconate, Iron-Vitamins (GERITOL PO), hydrOXYzine, traMADol, aspirin EC, pravastatin, diazepam, propranolol ER, and amLODipine.  No orders of the defined types were placed in this encounter.   There are no discontinued medications.  Follow-up: No Follow-up on file.   Crecencio Mc, MD

## 2017-01-26 NOTE — Patient Instructions (Addendum)
   If Dr Raul Del can order you a TENS unit for your back  Let her do so.  4   Most of your blood pressure readings are fine.  We do not need to change your blood pressure medications today , only if your readings are all consistently over 140/80  You can try   adding tylenol PM to your diazepam for your insomnia

## 2017-01-27 NOTE — Assessment & Plan Note (Signed)
She is tolerating  a baby aspirin and statin now.   Lab Results  Component Value Date   CHOL 254 (H) 06/09/2016   HDL 60.00 06/09/2016   LDLCALC 174 (H) 06/09/2016   LDLDIRECT 145.6 12/21/2012   TRIG 103.0 06/09/2016   CHOLHDL 4 06/09/2016

## 2017-01-27 NOTE — Assessment & Plan Note (Signed)
Well controlled on current regimen. Renal function stable, no changes today.  Lab Results  Component Value Date   CREATININE 0.90 11/30/2016   Lab Results  Component Value Date   NA 134 (L) 11/30/2016   K 4.2 11/30/2016   CL 100 (L) 11/30/2016   CO2 26 11/30/2016

## 2017-01-27 NOTE — Assessment & Plan Note (Signed)
Chronic, stable , managed with salt loading.  Weight gain addressed and reassurance provided that she has no signg of volume overload or overweight.    Lab Results  Component Value Date   NA 134 (L) 11/30/2016   K 4.2 11/30/2016   CL 100 (L) 11/30/2016   CO2 26 11/30/2016

## 2017-01-27 NOTE — Assessment & Plan Note (Signed)
Multi level DDD by MRI Feb 2013, no high grade stenosis or nerve root irritation.  Intolerant of most medications,  Recommended use of TENS unit

## 2017-02-01 ENCOUNTER — Other Ambulatory Visit: Payer: Self-pay

## 2017-02-01 ENCOUNTER — Ambulatory Visit
Admission: EM | Admit: 2017-02-01 | Discharge: 2017-02-01 | Disposition: A | Discharge status: Home / Self Care | Payer: Medicare Other | Attending: Family Medicine | Admitting: Family Medicine

## 2017-02-01 DIAGNOSIS — N390 Urinary tract infection, site not specified: Secondary | ICD-10-CM | POA: Diagnosis not present

## 2017-02-01 DIAGNOSIS — R350 Frequency of micturition: Secondary | ICD-10-CM

## 2017-02-01 DIAGNOSIS — R319 Hematuria, unspecified: Secondary | ICD-10-CM

## 2017-02-01 LAB — URINALYSIS, COMPLETE (UACMP) WITH MICROSCOPIC
Bilirubin Urine: NEGATIVE
Glucose, UA: NEGATIVE mg/dL
Ketones, ur: NEGATIVE mg/dL
Nitrite: POSITIVE — AB
Specific Gravity, Urine: 1.01 (ref 1.005–1.030)
pH: 7 (ref 5.0–8.0)

## 2017-02-01 MED ORDER — CEPHALEXIN 500 MG PO CAPS: 500.0000 mg | ORAL_CAPSULE | Freq: Two times a day (BID) | ORAL | 0 refills | Status: AC

## 2017-02-01 NOTE — ED Provider Notes (Signed)
[facility] ____________________________________________  Time seen: Approximately 9:30 AM  I have reviewed the triage vital signs and the nursing notes.   HISTORY  Chief Complaint Urinary Frequency   HPI [name] is a 84 y.o. female presenting for evaluation of urinary frequency that has been present for the 2 days. Denies pain with urination or abnormal colors. Denies abdominal pain, atypical back pain, vaginal complaints, fevers, vomiting or bowel changes. Continues to eat and drink as normal. Denies known triggers. Reports otherwise feels well.  Denies chest pain, shortness of breath, abdominal pain,or rash. Denies recent sickness. Denies recent antibiotic use.  Denies renal insufficiency.  Crecencio Mc, MD: PCP   Past Medical History:  Diagnosis Date  . Anxiety   . Back pain   . Chronic orthostatic hypotension June 2012   with pprior syncopal event,  did not tolelrate florinef trial  . Depression   . Hyperlipidemia   . Hypertension     Patient Active Problem List   Diagnosis Date Noted  . 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 Iliac bone pain 07/29/2015  . Osteoporosis 03/13/2015  . Raynaud phenomenon 02/24/2015  . Hyponatremia 02/09/2015  . Urinary frequency 01/30/2015  . Medicare annual wellness visit, subsequent 01/06/2015  . Encounter for preventive health examination 01/06/2015  . Underweight 06/15/2014  . Chronic left hip pain 06/13/2014  . Benign essential tremor 10/02/2013  . Constipation 08/12/2013  . Urinary incontinence, urge 05/29/2012  . Nocturnal leg cramps 12/15/2011  . Screening for breast cancer 10/27/2011  . Degeneration of lumbar or lumbosacral intervertebral disc 09/01/2011  . Insomnia 01/07/2011  . Hyperlipidemia LDL goal <130 11/08/2010  . Back pain, sacroiliac 11/08/2010  . Essential hypertension 09/18/2010    Past Surgical History:  Procedure Laterality Date  . ANKLE SURGERY Left   .  CATARACT EXTRACTION, BILATERAL    . WRIST SURGERY Right      No current facility-administered medications for this encounter.   Current Outpatient Medications:  .  amLODipine (NORVASC) 5 MG tablet, Take 1 tablet (5 mg total) by mouth daily., Disp: 90 tablet, Rfl: 1 .  aspirin EC 81 MG tablet, Take 1 tablet (81 mg total) by mouth daily., Disp: 90 tablet, Rfl: 2 .  diazepam (VALIUM) 5 MG tablet, Take 1.5 tablets (7.5 mg total) by mouth at bedtime as needed (insomnia). for anxiety, Disp: 45 tablet, Rfl: 0 .  docusate sodium (COLACE) 100 MG capsule, Take 1 capsule (100 mg total) by mouth 2 (two) times daily. (Patient taking differently: Take 200 mg by mouth 2 (two) times daily. ), Disp: 60 capsule, Rfl: 11 .  Garlic Oil 2 MG CAPS, Take by mouth., Disp: , Rfl:  .  hydrOXYzine (ATARAX/VISTARIL) 25 MG tablet, TAKE ONE CAPSULE BY MOUTH THREE TIMES DAILY AS NEEDED, Disp: 90 tablet, Rfl: 1 .  Iron-Vitamins (GERITOL PO), Take 1 tablet by mouth daily., Disp: , Rfl:  .  lactulose (CHRONULAC) 10 GM/15ML solution, TAKE 30MLS(0NE OUNCE) BY MOUTH 2 TIMES DAILY AS NEEDED FOR MILD CONSTIPATION. DO NOT USE MORE THAN ONCE A WEEK, Disp: 240 mL, Rfl: 2 .  Lutein 20 MG CAPS, Take 20 mg by mouth daily.  , Disp: , Rfl:  .  magnesium oxide (MAG-OX) 400 MG tablet, Take 400 mg by mouth., Disp: , Rfl:  .  Potassium Gluconate 2 MEQ TABS, Take by mouth., Disp: , Rfl:  .  pravastatin (PRAVACHOL) 20 MG tablet, Take 1 tablet (20 mg total) by mouth  daily., Disp: 30 tablet, Rfl: 03 .  propranolol ER (INDERAL LA) 60 MG 24 hr capsule, Take 1 capsule (60 mg total) by mouth at bedtime., Disp: 30 capsule, Rfl: 3 .  traMADol (ULTRAM) 50 MG tablet, Take 1 tablet (50 mg total) every 8 (eight) hours as needed by mouth., Disp: 90 tablet, Rfl: 2 .  VITAMIN E PO, Take 400 Units by mouth daily. , Disp: , Rfl:  .  cephALEXin (KEFLEX) 500 MG capsule, Take 1 capsule (500 mg total) by mouth 2 (two) times daily for 7 days., Disp: 14 capsule, Rfl:  0  Allergies Patient has no known allergies.  Family History  Problem Relation Age of Onset  . Cancer Neg Hx   . Diabetes Neg Hx   . Heart disease Neg Hx     Social History Social History   Tobacco Use  . Smoking status: Never Smoker  . Smokeless tobacco: Never Used  Substance Use Topics  . Alcohol use: No  . Drug use: No    Review of Systems Constitutional: No fever/chills Cardiovascular: Denies chest pain. Respiratory: Denies shortness of breath. Gastrointestinal: No abdominal pain.   Genitourinary: As above.  Skin: Negative for rash.   ____________________________________________   PHYSICAL EXAM:  VITAL SIGNS: ED Triage Vitals  Enc Vitals Group     BP 02/01/17 0913 (!) 159/71     Pulse Rate 02/01/17 0913 66     Resp 02/01/17 0913 16     Temp 02/01/17 0913 97.8 F (36.6 C)     Temp Source 02/01/17 0913 Oral     SpO2 02/01/17 0913 99 %     Weight 02/01/17 0906 131 lb (59.4 kg)     Height 02/01/17 0906 5\' 7"  (1.702 m)     Head Circumference --      Peak Flow --      Pain Score 02/01/17 0911 7     Pain Loc --      Pain Edu? --      Excl. in [place]? --     Constitutional: Alert and oriented. Well appearing and in no acute distress. Cardiovascular: Normal rate, regular rhythm. Grossly normal heart sounds.  Good peripheral circulation. Respiratory: Normal respiratory effort without tachypnea nor retractions. Breath sounds are clear and equal bilaterally. No wheezes, rales, rhonchi. Gastrointestinal: Soft and nontender. No CVA tenderness. Musculoskeletal: Steady gait.   Neurologic:  Normal speech and language.Speech is normal. No gait instability.  Skin:  Skin is warm, dry.  Psychiatric: Mood and affect are normal. Speech and behavior are normal. Patient exhibits appropriate insight and judgment.    ___________________________________________   LABS (all labs ordered are listed, but only abnormal results are displayed)  Labs Reviewed  URINALYSIS,  COMPLETE (UACMP) WITH MICROSCOPIC - Abnormal; Notable for the following components:      Result Value   APPearance CLOUDY (*)    Hgb urine dipstick MODERATE (*)    Protein, ur TRACE (*)    Nitrite POSITIVE (*)    Leukocytes, UA LARGE (*)    Squamous Epithelial / LPF 0-5 (*)    Bacteria, UA MANY (*)    All other components within normal limits  URINE CULTURE    RADIOLOGY  No results found. ____________________________________________   PROCEDURES Procedures   INITIAL IMPRESSION / ASSESSMENT AND PLAN / ED COURSE  Pertinent labs & imaging results that were available during my care of the patient were reviewed by me and considered in my medical decision making (see chart for  details).  Well appearing patient, no acute distress. Dysuria x two days, urinalysis reviewed. Will treat uti with cephalexin. Encourage rest, fluids, and supportive care. Discussed indication, risks and benefits of medications with patient.  Discussed follow up with Primary care physician this week. Discussed follow up and return parameters including no resolution or any worsening concerns. Patient verbalized understanding and agreed to plan.   ____________________________________________   FINAL CLINICAL IMPRESSION(S) / ED DIAGNOSES  Final diagnoses:  Urinary tract infection with hematuria, site unspecified     ED Discharge Orders        Ordered    cephALEXin (KEFLEX) 500 MG capsule  2 times daily     02/01/17 0940       Note: This dictation was prepared with Dragon dictation along with smaller phrase technology. Any transcriptional errors that result from this process are unintentional.         Marylene Land, NP 02/01/17 [phone]

## 2017-02-01 NOTE — Discharge Instructions (Signed)
Take medication as prescribed. Rest. Drink plenty of fluids.  ° °Follow up with your primary care physician this week as needed. Return to Urgent care for new or worsening concerns.  ° °

## 2017-02-01 NOTE — ED Triage Notes (Signed)
Patient complains of urinary frequency and urgency that started Friday night and has remained constant. Patient denies any burning or pain with urination. Patient reports that this happens often and she would like to know why.

## 2017-02-03 LAB — URINE CULTURE: Culture: >=100000 — AB

## 2017-02-04 ENCOUNTER — Telehealth: Payer: Self-pay | Admitting: *Deleted

## 2017-02-10 DIAGNOSIS — I10 Essential (primary) hypertension: Secondary | ICD-10-CM | POA: Diagnosis not present

## 2017-02-10 DIAGNOSIS — Z13220 Encounter for screening for lipoid disorders: Secondary | ICD-10-CM | POA: Diagnosis not present

## 2017-02-10 DIAGNOSIS — G629 Polyneuropathy, unspecified: Secondary | ICD-10-CM | POA: Diagnosis not present

## 2017-02-10 DIAGNOSIS — G4709 Other insomnia: Secondary | ICD-10-CM | POA: Diagnosis not present

## 2017-02-10 DIAGNOSIS — M5136 Other intervertebral disc degeneration, lumbar region: Secondary | ICD-10-CM | POA: Diagnosis not present

## 2017-02-16 DIAGNOSIS — R350 Frequency of micturition: Secondary | ICD-10-CM | POA: Diagnosis not present

## 2017-02-19 DIAGNOSIS — H3411 Central retinal artery occlusion, right eye: Secondary | ICD-10-CM | POA: Diagnosis not present

## 2017-02-19 DIAGNOSIS — H3562 Retinal hemorrhage, left eye: Secondary | ICD-10-CM | POA: Diagnosis not present

## 2017-02-23 DIAGNOSIS — G629 Polyneuropathy, unspecified: Secondary | ICD-10-CM | POA: Diagnosis not present

## 2017-02-23 DIAGNOSIS — I639 Cerebral infarction, unspecified: Secondary | ICD-10-CM | POA: Diagnosis not present

## 2017-02-23 DIAGNOSIS — Z5181 Encounter for therapeutic drug level monitoring: Secondary | ICD-10-CM | POA: Diagnosis not present

## 2017-02-23 DIAGNOSIS — F5101 Primary insomnia: Secondary | ICD-10-CM | POA: Diagnosis not present

## 2017-02-23 DIAGNOSIS — E782 Mixed hyperlipidemia: Secondary | ICD-10-CM | POA: Diagnosis not present

## 2017-03-04 DIAGNOSIS — I6523 Occlusion and stenosis of bilateral carotid arteries: Secondary | ICD-10-CM | POA: Diagnosis not present

## 2017-03-04 DIAGNOSIS — I639 Cerebral infarction, unspecified: Secondary | ICD-10-CM | POA: Diagnosis not present

## 2017-03-04 DIAGNOSIS — R350 Frequency of micturition: Secondary | ICD-10-CM | POA: Diagnosis not present

## 2017-03-15 DIAGNOSIS — T50905A Adverse effect of unspecified drugs, medicaments and biological substances, initial encounter: Secondary | ICD-10-CM | POA: Diagnosis not present

## 2017-03-15 DIAGNOSIS — R300 Dysuria: Secondary | ICD-10-CM | POA: Diagnosis not present

## 2017-03-15 DIAGNOSIS — M7989 Other specified soft tissue disorders: Secondary | ICD-10-CM | POA: Diagnosis not present

## 2017-03-15 DIAGNOSIS — R8290 Unspecified abnormal findings in urine: Secondary | ICD-10-CM | POA: Diagnosis not present

## 2017-03-15 DIAGNOSIS — G4709 Other insomnia: Secondary | ICD-10-CM | POA: Diagnosis not present

## 2017-03-15 DIAGNOSIS — G629 Polyneuropathy, unspecified: Secondary | ICD-10-CM | POA: Diagnosis not present

## 2017-03-26 DIAGNOSIS — L600 Ingrowing nail: Secondary | ICD-10-CM | POA: Diagnosis not present

## 2017-03-26 DIAGNOSIS — M79674 Pain in right toe(s): Secondary | ICD-10-CM | POA: Diagnosis not present

## 2017-04-05 ENCOUNTER — Other Ambulatory Visit: Payer: Self-pay

## 2017-04-05 ENCOUNTER — Ambulatory Visit
Admission: EM | Admit: 2017-04-05 | Discharge: 2017-04-05 | Disposition: A | Discharge status: Home / Self Care | Payer: Medicare Other | Attending: Family Medicine | Admitting: Family Medicine

## 2017-04-05 ENCOUNTER — Ambulatory Visit (INDEPENDENT_AMBULATORY_CARE_PROVIDER_SITE_OTHER): Payer: Medicare Other

## 2017-04-05 ENCOUNTER — Encounter: Payer: Self-pay | Admitting: Emergency Medicine

## 2017-04-05 DIAGNOSIS — S92352A Displaced fracture of fifth metatarsal bone, left foot, initial encounter for closed fracture: Secondary | ICD-10-CM

## 2017-04-05 DIAGNOSIS — M25572 Pain in left ankle and joints of left foot: Secondary | ICD-10-CM | POA: Diagnosis not present

## 2017-04-05 DIAGNOSIS — W19XXXA Unspecified fall, initial encounter: Secondary | ICD-10-CM

## 2017-04-05 NOTE — ED Provider Notes (Signed)
[facility]    CSN: [id number] Arrival date & time: 04/05/17  1356     History   Chief Complaint Chief Complaint  Patient presents with  . Foot Pain    left    HPI [name] is a 84 y.o. female.   84 yo female with a c/o left foot and ankle pain for the past week since tripping and falling one week ago. States she's been soaking her foot. Denies any fevers, chills. Has been weight bearing, however it's painful.   The history is provided by the patient.  Foot Pain  This is a new problem.    Past Medical History:  Diagnosis Date  . Anxiety   . Back pain   . Chronic orthostatic hypotension June 2012   with pprior syncopal event,  did not tolelrate florinef trial  . Depression   . Hyperlipidemia   . Hypertension     Patient Active Problem List   Diagnosis Date Noted  . 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 Iliac bone pain 07/29/2015  . Osteoporosis 03/13/2015  . Raynaud phenomenon 02/24/2015  . Hyponatremia 02/09/2015  . Urinary frequency 01/30/2015  . Medicare annual wellness visit, subsequent 01/06/2015  . Encounter for preventive health examination 01/06/2015  . Underweight 06/15/2014  . Chronic left hip pain 06/13/2014  . Benign essential tremor 10/02/2013  . Constipation 08/12/2013  . Urinary incontinence, urge 05/29/2012  . Nocturnal leg cramps 12/15/2011  . Screening for breast cancer 10/27/2011  . Degeneration of lumbar or lumbosacral intervertebral disc 09/01/2011  . Insomnia 01/07/2011  . Hyperlipidemia LDL goal <130 11/08/2010  . Back pain, sacroiliac 11/08/2010  . Essential hypertension 09/18/2010    Past Surgical History:  Procedure Laterality Date  . ANKLE SURGERY Left   . CATARACT EXTRACTION, BILATERAL    . WRIST SURGERY Right     OB History   None      Home Medications    Prior to Admission medications   Medication Sig Start Date End Date Taking? Authorizing Provider  amLODipine (NORVASC)  5 MG tablet Take 1 tablet (5 mg total) by mouth daily. 12/23/16  Yes Crecencio Mc, MD  aspirin EC 81 MG tablet Take 1 tablet (81 mg total) by mouth daily. 12/04/16  Yes Crecencio Mc, MD  diazepam (VALIUM) 5 MG tablet Take 1.5 tablets (7.5 mg total) by mouth at bedtime as needed (insomnia). for anxiety 12/16/16  Yes Crecencio Mc, MD  docusate sodium (COLACE) 100 MG capsule Take 1 capsule (100 mg total) by mouth 2 (two) times daily. Patient taking differently: Take 200 mg by mouth 2 (two) times daily.  10/31/13  Yes Crecencio Mc, MD  Garlic Oil 2 MG CAPS Take by mouth.   Yes [provider]  Iron-Vitamins (GERITOL PO) Take 1 tablet by mouth daily.   Yes [provider]  Lutein 20 MG CAPS Take 20 mg by mouth daily.     Yes [provider]  magnesium oxide (MAG-OX) 400 MG tablet Take 400 mg by mouth.   Yes [provider]  Potassium Gluconate 2 MEQ TABS Take by mouth.   Yes [provider]  pravastatin (PRAVACHOL) 20 MG tablet Take 1 tablet (20 mg total) by mouth daily. 12/16/16  Yes Crecencio Mc, MD  propranolol ER (INDERAL LA) 60 MG 24 hr capsule Take 1 capsule (60 mg total) by mouth at bedtime. 12/16/16  Yes Crecencio Mc, MD  VITAMIN  E PO Take 400 Units by mouth daily.    Yes [provider]  hydrOXYzine (ATARAX/VISTARIL) 25 MG tablet TAKE ONE CAPSULE BY MOUTH THREE TIMES DAILY AS NEEDED 09/18/16   Crecencio Mc, MD  lactulose (CHRONULAC) 10 GM/15ML solution TAKE 30MLS(0NE OUNCE) BY MOUTH 2 TIMES DAILY AS NEEDED FOR MILD CONSTIPATION. DO NOT USE MORE THAN ONCE A WEEK 08/23/15   Crecencio Mc, MD  traMADol (ULTRAM) 50 MG tablet Take 1 tablet (50 mg total) every 8 (eight) hours as needed by mouth. 11/11/16   Crecencio Mc, MD    Family History Family History  Problem Relation Age of Onset  . Colon cancer Mother   . Heart disease Father   . Kidney disease Father   . Cancer Neg Hx   . Diabetes Neg Hx     Social  History Social History   Tobacco Use  . Smoking status: Never Smoker  . Smokeless tobacco: Never Used  Substance Use Topics  . Alcohol use: No  . Drug use: No     Allergies   Patient has no known allergies.   Review of Systems Review of Systems   Physical Exam Triage Vital Signs ED Triage Vitals  Enc Vitals Group     BP 04/05/17 1420 (!) 142/58     Pulse Rate 04/05/17 1420 65     Resp 04/05/17 1420 16     Temp 04/05/17 1420 97.8 F (36.6 C)     Temp Source 04/05/17 1420 Oral     SpO2 04/05/17 1420 98 %     Weight 04/05/17 1420 131 lb (59.4 kg)     Height 04/05/17 1420 5\' 6"  (1.676 m)     Head Circumference --      Peak Flow --      Pain Score 04/05/17 1419 2     Pain Loc --      Pain Edu? --      Excl. in [place]? --    No data found.  Updated Vital Signs BP (!) 142/58 (BP Location: Left Arm)   Pulse 65   Temp 97.8 F (36.6 C) (Oral)   Resp 16   Ht 5\' 6"  (1.676 m)   Wt 131 lb (59.4 kg)   SpO2 98%   BMI 21.14 kg/m   Visual Acuity Right Eye Distance:   Left Eye Distance:   Bilateral Distance:    Right Eye Near:   Left Eye Near:    Bilateral Near:     Physical Exam  Constitutional: She appears well-developed and well-nourished. No distress.  Musculoskeletal:       Left ankle: She exhibits normal range of motion, no swelling, no ecchymosis, no deformity, no laceration and normal pulse. Tenderness (diffuse). Achilles tendon normal.       Left foot: There is bony tenderness (over proximal 5th metatarsal) and swelling. There is normal range of motion, no tenderness, normal capillary refill, no crepitus and no laceration.  Skin: She is not diaphoretic.  Nursing note and vitals reviewed.    UC Treatments / Results  Labs (all labs ordered are listed, but only abnormal results are displayed) Labs Reviewed - No data to display  EKG None Radiology Dg Ankle Complete Left  Result Date: 04/05/2017 CLINICAL DATA:  Lateral left ankle and foot pain after a  fall on 03/26/2017. Bruising near the second through fourth MTP regions. Initial encounter. EXAM: LEFT ANKLE COMPLETE - 3+ VIEW COMPARISON:  None. FINDINGS: Two cannulated lag screws  transfix an old medial malleolus fracture. There has been prior side plate and screw fixation of the distal fibula extending to the lateral malleolus. No acute fracture is identified directly about the ankle. There is a fifth metatarsal base fracture which is described on separate foot radiographs. There is no dislocation. The bones appear diffusely osteopenic. There may be mild soft tissue swelling along the lateral aspect of the ankle. IMPRESSION: 1. Prior internal fixation of distal tibia and fibula fractures without acute ankle fracture identified. 2. Fifth metatarsal fracture reported on separate foot radiographs. Electronically Signed   By: Logan Bores M.D.   On: 04/05/2017 15:12   Dg Foot Complete Left  Result Date: 04/05/2017 CLINICAL DATA:  Lateral left ankle and foot pain after a fall on 03/26/2017. Bruising near the second through fourth MTP regions. Initial encounter. EXAM: LEFT FOOT - COMPLETE 3+ VIEW COMPARISON:  None. FINDINGS: The bones appear diffusely osteopenic. There is a minimally displaced intra-articular fracture through the base of the fifth metatarsal with overlying soft tissue swelling. There is no dislocation. There has been prior internal fixation of the distal tibia and fibula. IMPRESSION: Minimally displaced fracture of the base of the fifth metatarsal. Electronically Signed   By: Logan Bores M.D.   On: 04/05/2017 15:10    Procedures Procedures (including critical care time)  Medications Ordered in UC Medications - No data to display   Initial Impression / Assessment and Plan / UC Course  I have reviewed the triage vital signs and the nursing notes.  Pertinent labs & imaging results that were available during my care of the patient were reviewed by me and considered in my medical decision  making (see chart for details).       Final Clinical Impressions(s) / UC Diagnoses   Final diagnoses:  Displaced fracture of fifth metatarsal bone, left foot, initial encounter for closed fracture    ED Discharge Orders    None     1. x-ray results and diagnosis reviewed with patient 2. Immobilized with hard sole shoe 3. Recommend supportive treatment with otc analgesics prn 4. Follow up with podiatrist this week 5. Follow-up prn  Controlled Substance Prescriptions [place] Controlled Substance Registry consulted? Not Applicable   Norval Gable, MD 04/05/17 [phone]

## 2017-04-05 NOTE — ED Triage Notes (Signed)
Patient in today c/o left foot pain after falling on March 22.

## 2017-04-05 NOTE — Discharge Instructions (Signed)
Follow up with podiatrist this week 

## 2017-05-05 ENCOUNTER — Other Ambulatory Visit: Payer: Self-pay | Admitting: Internal Medicine

## 2017-05-06 ENCOUNTER — Other Ambulatory Visit: Payer: Self-pay

## 2017-05-06 NOTE — Patient Outreach (Signed)
[facility]) Care Management  05/06/2017  [name] 1926/05/12 [id number]    Medication Adherence call to Mrs. [name] patient is showing past due under [facility] Ins.on Pravastatin 20 mg spoke with Mrs. [name] she did not want to engage on medication adherence she said she has stop taking this medication and has a new doctor that is going to see in a couple of weeks I offer to to get some assistance for her but she said she did not need it at this time.  [place] Management Direct Dial [phone]  Fax [phone] [email or web address]

## 2017-08-05 ENCOUNTER — Other Ambulatory Visit: Payer: Self-pay | Admitting: Internal Medicine

## 2017-08-05 DIAGNOSIS — R42 Dizziness and giddiness: Secondary | ICD-10-CM

## 2017-09-01 ENCOUNTER — Telehealth: Payer: Self-pay | Admitting: Internal Medicine

## 2017-09-01 NOTE — Telephone Encounter (Signed)
Called pt and LVM regarding AWV.  Pt called back and LVM saying that she was not coming back to [facility] and did not want wellness.  PCP removed from Epic

## 2017-10-06 ENCOUNTER — Emergency Department: Payer: Medicare Other

## 2017-10-06 ENCOUNTER — Encounter: Payer: Self-pay | Admitting: Emergency Medicine

## 2017-10-06 ENCOUNTER — Emergency Department
Admission: EM | Admit: 2017-10-06 | Discharge: 2017-10-06 | Disposition: A | Discharge status: Home / Self Care | Payer: Medicare Other | Attending: Emergency Medicine | Admitting: Emergency Medicine

## 2017-10-06 DIAGNOSIS — Y998 Other external cause status: Secondary | ICD-10-CM | POA: Diagnosis not present

## 2017-10-06 DIAGNOSIS — S82832A Other fracture of upper and lower end of left fibula, initial encounter for closed fracture: Secondary | ICD-10-CM | POA: Diagnosis not present

## 2017-10-06 DIAGNOSIS — Y9389 Activity, other specified: Secondary | ICD-10-CM | POA: Diagnosis not present

## 2017-10-06 DIAGNOSIS — E785 Hyperlipidemia, unspecified: Secondary | ICD-10-CM | POA: Insufficient documentation

## 2017-10-06 DIAGNOSIS — Y92009 Unspecified place in unspecified non-institutional (private) residence as the place of occurrence of the external cause: Secondary | ICD-10-CM | POA: Diagnosis not present

## 2017-10-06 DIAGNOSIS — W19XXXA Unspecified fall, initial encounter: Secondary | ICD-10-CM

## 2017-10-06 DIAGNOSIS — I10 Essential (primary) hypertension: Secondary | ICD-10-CM | POA: Insufficient documentation

## 2017-10-06 DIAGNOSIS — Y33XXXA Other specified events, undetermined intent, initial encounter: Secondary | ICD-10-CM | POA: Diagnosis not present

## 2017-10-06 DIAGNOSIS — S82392A Other fracture of lower end of left tibia, initial encounter for closed fracture: Secondary | ICD-10-CM | POA: Insufficient documentation

## 2017-10-06 DIAGNOSIS — S82302A Unspecified fracture of lower end of left tibia, initial encounter for closed fracture: Secondary | ICD-10-CM

## 2017-10-06 DIAGNOSIS — S8992XA Unspecified injury of left lower leg, initial encounter: Secondary | ICD-10-CM | POA: Diagnosis present

## 2017-10-06 LAB — CBC WITH DIFFERENTIAL/PLATELET
Basophils Absolute: 0 10*3/uL (ref 0–0.1)
Basophils Relative: 0 %
Eosinophils Absolute: 0.2 10*3/uL (ref 0–0.7)
Eosinophils Relative: 3 %
HCT: 37.3 % (ref 35.0–47.0)
Hemoglobin: 13.2 g/dL (ref 12.0–16.0)
Lymphocytes Relative: 15 %
Lymphs Abs: 1.3 10*3/uL (ref 1.0–3.6)
MCH: 35.7 pg — ABNORMAL HIGH (ref 26.0–34.0)
MCHC: 35.5 g/dL (ref 32.0–36.0)
MCV: 100.7 fL — ABNORMAL HIGH (ref 80.0–100.0)
Monocytes Absolute: 0.8 10*3/uL (ref 0.2–0.9)
Monocytes Relative: 9 %
Neutro Abs: 6.4 10*3/uL (ref 1.4–6.5)
Neutrophils Relative %: 73 %
Platelets: 270 10*3/uL (ref 150–440)
RBC: 3.7 MIL/uL — ABNORMAL LOW (ref 3.80–5.20)
RDW: 13.1 % (ref 11.5–14.5)
WBC: 8.8 10*3/uL (ref 3.6–11.0)

## 2017-10-06 LAB — COMPREHENSIVE METABOLIC PANEL
ALT: 12 U/L (ref 0–44)
AST: 22 U/L (ref 15–41)
Albumin: 4.3 g/dL (ref 3.5–5.0)
Alkaline Phosphatase: 42 U/L (ref 38–126)
Anion gap: 7 (ref 5–15)
BUN: 13 mg/dL (ref 8–23)
CO2: 28 mmol/L (ref 22–32)
Calcium: 9.2 mg/dL (ref 8.9–10.3)
Chloride: 100 mmol/L (ref 98–111)
Creatinine, Ser: 0.79 mg/dL (ref 0.44–1.00)
GFR calc Af Amer: >60 mL/min (ref >60)
GFR calc non Af Amer: >60 mL/min (ref >60)
Glucose, Bld: 98 mg/dL (ref 70–99)
Potassium: 4.6 mmol/L (ref 3.5–5.1)
Sodium: 135 mmol/L (ref 135–145)
Total Bilirubin: 0.7 mg/dL (ref 0.3–1.2)
Total Protein: 7.2 g/dL (ref 6.5–8.1)

## 2017-10-06 MED ORDER — MORPHINE SULFATE (PF) 4 MG/ML IV SOLN
4.0000 mg | Freq: Once | INTRAVENOUS | Status: AC
  Administered 2017-10-06: 4 mg via INTRAVENOUS

## 2017-10-06 MED ORDER — OXYCODONE-ACETAMINOPHEN 5-325 MG PO TABS: 1.0000 | ORAL_TABLET | Freq: Three times a day (TID) | ORAL | 0 refills | Status: DC | PRN

## 2017-10-06 MED ORDER — MORPHINE SULFATE (PF) 4 MG/ML IV SOLN
INTRAVENOUS | Status: AC
  Filled 2017-10-06: qty 1

## 2017-10-06 MED ORDER — ONDANSETRON HCL 4 MG/2ML IJ SOLN
INTRAMUSCULAR | Status: AC
  Filled 2017-10-06: qty 2

## 2017-10-06 NOTE — ED Triage Notes (Signed)
Pt has deformity of LLE. Pt has pins and rods in that extremity from a previous fx in 1998. Pt slipped in a ditch at home while picking up "trash." Pt has bruising to LLE

## 2017-10-06 NOTE — ED Notes (Signed)
Nurse assisted pt to call Dr. Marry Guan for possible appointment.

## 2017-10-06 NOTE — ED Provider Notes (Signed)
[facility] Emergency Department Provider Note       Time seen: ----------------------------------------- 10:00 AM on 10/06/2017 -----------------------------------------   I have reviewed the triage vital signs and the nursing notes.  HISTORY   Chief Complaint No chief complaint on file.    HPI [name] is a 84 y.o. female with a history of anxiety, back pain, depression, hyperlipidemia, hypertension who presents to the ED for a fall.  Patient was reportedly at home and picking up trash.  She slipped in a ditch and caused a deformity to left lower extremity with bruising.  Left lower extremity was splinted for comfort prior to arrival.  She denies any other injuries or complaints.  Past Medical History:  Diagnosis Date  . Anxiety   . Back pain   . Chronic orthostatic hypotension June 2012   with pprior syncopal event,  did not tolelrate florinef trial  . Depression   . Hyperlipidemia   . Hypertension     Patient Active Problem List   Diagnosis Date Noted  . 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 Iliac bone pain 07/29/2015  . Osteoporosis 03/13/2015  . Raynaud phenomenon 02/24/2015  . Hyponatremia 02/09/2015  . Urinary frequency 01/30/2015  . Medicare annual wellness visit, subsequent 01/06/2015  . Encounter for preventive health examination 01/06/2015  . Underweight 06/15/2014  . Chronic left hip pain 06/13/2014  . Benign essential tremor 10/02/2013  . Constipation 08/12/2013  . Urinary incontinence, urge 05/29/2012  . Nocturnal leg cramps 12/15/2011  . Screening for breast cancer 10/27/2011  . Degeneration of lumbar or lumbosacral intervertebral disc 09/01/2011  . Insomnia 01/07/2011  . Hyperlipidemia LDL goal <130 11/08/2010  . Back pain, sacroiliac 11/08/2010  . Essential hypertension 09/18/2010    Past Surgical History:  Procedure Laterality Date  . ANKLE SURGERY Left   . CATARACT EXTRACTION, BILATERAL     . WRIST SURGERY Right     Allergies Patient has no known allergies.  Social History Social History   Tobacco Use  . Smoking status: Never Smoker  . Smokeless tobacco: Never Used  Substance Use Topics  . Alcohol use: No  . Drug use: No   Review of Systems Constitutional: Negative for fever. Cardiovascular: Negative for chest pain. Respiratory: Negative for shortness of breath. Gastrointestinal: Negative for abdominal pain Musculoskeletal: Positive for left leg pain Skin: Positive for contusion Neurological: Negative for headaches, focal weakness or numbness.  All systems negative/normal/unremarkable except as stated in the HPI  ____________________________________________   PHYSICAL EXAM:  VITAL SIGNS: ED Triage Vitals  Enc Vitals Group     BP      Pulse      Resp      Temp      Temp src      SpO2      Weight      Height      Head Circumference      Peak Flow      Pain Score      Pain Loc      Pain Edu?      Excl. in [place]?    Constitutional: Alert and oriented. Well appearing and in no distress. Eyes: Conjunctivae are normal. Normal extraocular movements. ENT   Head: Normocephalic and atraumatic.   Nose: No congestion/rhinnorhea.   Mouth/Throat: Mucous membranes are moist.   Neck: No stridor. Cardiovascular: Normal rate, regular rhythm. No murmurs, rubs, or gallops. Respiratory: Normal respiratory effort without tachypnea nor retractions. Breath sounds  are clear and equal bilaterally. No wheezes/rales/rhonchi. Musculoskeletal: Left foot and ankle appear to be externally rotated compared to the alignment of the tibia.  She is neurovascularly intact at this time. Neurologic:  Normal speech and language. No gross focal neurologic deficits are appreciated.  Skin: Contusion is noted on the left lower extremity near the ankle, previous incision sites are noted Psychiatric: Mood and affect are normal. Speech and behavior are normal.   ____________________________________________  ED COURSE:  As part of my medical decision making, I reviewed the following data within the [place] History obtained from family if available, nursing notes, old chart and ekg, as well as notes from prior ED visits. Patient presented for left leg pain after a fall, we will assess with labs and imaging as indicated at this time.   Marland KitchenSplint Application Date/Time: 76/02/8313 10:16 AM Performed by: Earleen Newport, MD Authorized by: Earleen Newport, MD   Consent:    Consent obtained:  Verbal   Consent given by:  Patient Pre-procedure details:    Sensation:  Normal Procedure details:    Laterality:  Left   Location:  Ankle   Ankle:  L ankle   Splint type:  Short leg and ankle stirrup   Supplies:  Ortho-Glass Post-procedure details:    Pain:  Improved   Sensation:  Normal   Patient tolerance of procedure:  Tolerated well, no immediate complications Comments:     Left ankle was splinted in normal anatomic alignment with a posterior short-leg and stirrup splint.  She was given for morphine to more easily manipulate left ankle which she tolerated well.   ____________________________________________   LABS (pertinent positives/negatives)  Labs Reviewed  CBC WITH DIFFERENTIAL/PLATELET - Abnormal; Notable for the following components:      Result Value   RBC 3.70 (*)    MCV 100.7 (*)    MCH 35.7 (*)    All other components within normal limits  COMPREHENSIVE METABOLIC PANEL    RADIOLOGY Images were viewed by me  Left ankle x-rays, left tib-fib x-rays IMPRESSION: Acute fractures of the tibia and fibula  Chronic healed fracture of the medial and lateral malleolus. IMPRESSION: Acute spiral fracture distal tibia. ____________________________________________  DIFFERENTIAL DIAGNOSIS   Fracture, dislocation, hardware disruption  FINAL ASSESSMENT AND PLAN  Spiral fracture of the distal tibia,  proximal fibula fracture   Plan: The patient had presented for left lower extremity injury after a fall. Patient's labs not reveal any acute process. Patient's imaging did reveal a spiral fracture of the distal tibia.  She was placed in a posterior and stirrup splint as dictated above and she tolerated this well.  She was discussed with orthopedics who states she can follow-up as an outpatient. She will be nonweightbearing until follow-up.   Laurence Aly, MD   Note: This note was generated in part or whole with voice recognition software. Voice recognition is usually quite accurate but there are transcription errors that can and very often do occur. I apologize for any typographical errors that were not detected and corrected.     Earleen Newport, MD 10/06/17 [phone]

## 2017-10-06 NOTE — ED Notes (Signed)
Pt's LLE has been splinted by Dr. Jimmye Norman and this nurse.

## 2017-10-09 ENCOUNTER — Emergency Department (HOSPITAL_COMMUNITY): Payer: Medicare Other

## 2017-10-09 ENCOUNTER — Encounter (HOSPITAL_COMMUNITY): Payer: Self-pay | Admitting: Emergency Medicine

## 2017-10-09 ENCOUNTER — Inpatient Hospital Stay (HOSPITAL_COMMUNITY)
Admission: EM | Admit: 2017-10-09 | Discharge: 2017-10-11 | DRG: 494 | Disposition: A | Discharge status: Home Health | Payer: Medicare Other | Attending: Internal Medicine | Admitting: Internal Medicine

## 2017-10-09 DIAGNOSIS — S82255D Nondisplaced comminuted fracture of shaft of left tibia, subsequent encounter for closed fracture with routine healing: Secondary | ICD-10-CM | POA: Diagnosis not present

## 2017-10-09 DIAGNOSIS — Z8249 Family history of ischemic heart disease and other diseases of the circulatory system: Secondary | ICD-10-CM | POA: Diagnosis not present

## 2017-10-09 DIAGNOSIS — S82202A Unspecified fracture of shaft of left tibia, initial encounter for closed fracture: Secondary | ICD-10-CM

## 2017-10-09 DIAGNOSIS — S82409A Unspecified fracture of shaft of unspecified fibula, initial encounter for closed fracture: Secondary | ICD-10-CM

## 2017-10-09 DIAGNOSIS — K5900 Constipation, unspecified: Secondary | ICD-10-CM | POA: Diagnosis present

## 2017-10-09 DIAGNOSIS — E860 Dehydration: Secondary | ICD-10-CM | POA: Diagnosis present

## 2017-10-09 DIAGNOSIS — Z66 Do not resuscitate: Secondary | ICD-10-CM | POA: Diagnosis present

## 2017-10-09 DIAGNOSIS — D72829 Elevated white blood cell count, unspecified: Secondary | ICD-10-CM | POA: Diagnosis present

## 2017-10-09 DIAGNOSIS — S82402A Unspecified fracture of shaft of left fibula, initial encounter for closed fracture: Secondary | ICD-10-CM

## 2017-10-09 DIAGNOSIS — Z841 Family history of disorders of kidney and ureter: Secondary | ICD-10-CM

## 2017-10-09 DIAGNOSIS — E785 Hyperlipidemia, unspecified: Secondary | ICD-10-CM | POA: Diagnosis present

## 2017-10-09 DIAGNOSIS — Z7982 Long term (current) use of aspirin: Secondary | ICD-10-CM

## 2017-10-09 DIAGNOSIS — Z800 Family history of malignant neoplasm of digestive organs: Secondary | ICD-10-CM | POA: Diagnosis not present

## 2017-10-09 DIAGNOSIS — S82402D Unspecified fracture of shaft of left fibula, subsequent encounter for closed fracture with routine healing: Secondary | ICD-10-CM | POA: Diagnosis present

## 2017-10-09 DIAGNOSIS — S82209A Unspecified fracture of shaft of unspecified tibia, initial encounter for closed fracture: Secondary | ICD-10-CM

## 2017-10-09 DIAGNOSIS — S82832A Other fracture of upper and lower end of left fibula, initial encounter for closed fracture: Secondary | ICD-10-CM | POA: Diagnosis present

## 2017-10-09 DIAGNOSIS — I10 Essential (primary) hypertension: Secondary | ICD-10-CM

## 2017-10-09 DIAGNOSIS — S82242A Displaced spiral fracture of shaft of left tibia, initial encounter for closed fracture: Secondary | ICD-10-CM | POA: Diagnosis not present

## 2017-10-09 DIAGNOSIS — D638 Anemia in other chronic diseases classified elsewhere: Secondary | ICD-10-CM | POA: Diagnosis present

## 2017-10-09 DIAGNOSIS — W1789XA Other fall from one level to another, initial encounter: Secondary | ICD-10-CM | POA: Diagnosis present

## 2017-10-09 HISTORY — DX: Basal cell carcinoma of skin, unspecified: C44.91

## 2017-10-09 HISTORY — DX: Frequency of micturition: R35.0

## 2017-10-09 HISTORY — DX: Unspecified fracture of shaft of unspecified tibia, initial encounter for closed fracture: S82.209A

## 2017-10-09 HISTORY — DX: Unspecified osteoarthritis, unspecified site: M19.90

## 2017-10-09 HISTORY — DX: Unspecified fracture of shaft of unspecified fibula, initial encounter for closed fracture: S82.409A

## 2017-10-09 LAB — CBC
HCT: 35.5 % — ABNORMAL LOW (ref 36.0–46.0)
Hemoglobin: 12.1 g/dL (ref 12.0–15.0)
MCH: 33.2 pg (ref 26.0–34.0)
MCHC: 34.1 g/dL (ref 30.0–36.0)
MCV: 97.5 fL (ref 78.0–100.0)
Platelets: 271 10*3/uL (ref 150–400)
RBC: 3.64 MIL/uL — ABNORMAL LOW (ref 3.87–5.11)
RDW: 12.6 % (ref 11.5–15.5)
WBC: 10.6 10*3/uL — ABNORMAL HIGH (ref 4.0–10.5)

## 2017-10-09 LAB — BASIC METABOLIC PANEL
Anion gap: 12 (ref 5–15)
BUN: 13 mg/dL (ref 8–23)
CO2: 28 mmol/L (ref 22–32)
Calcium: 9.3 mg/dL (ref 8.9–10.3)
Chloride: 99 mmol/L (ref 98–111)
Creatinine, Ser: 0.76 mg/dL (ref 0.44–1.00)
GFR calc Af Amer: >60 mL/min (ref >60)
GFR calc non Af Amer: >60 mL/min (ref >60)
Glucose, Bld: 105 mg/dL — ABNORMAL HIGH (ref 70–99)
Potassium: 3.9 mmol/L (ref 3.5–5.1)
Sodium: 139 mmol/L (ref 135–145)

## 2017-10-09 MED ORDER — MORPHINE SULFATE (PF) 2 MG/ML IV SOLN: 2.0000 mg | INTRAVENOUS | Status: DC | PRN

## 2017-10-09 MED ORDER — ONDANSETRON HCL 4 MG PO TABS: 4.0000 mg | ORAL_TABLET | Freq: Four times a day (QID) | ORAL | Status: DC | PRN

## 2017-10-09 MED ORDER — HYDROMORPHONE HCL 1 MG/ML IJ SOLN
1.0000 mg | Freq: Once | INTRAMUSCULAR | Status: AC
  Administered 2017-10-09: 1 mg via INTRAVENOUS
  Filled 2017-10-09: qty 1

## 2017-10-09 MED ORDER — HYDROMORPHONE HCL 1 MG/ML PO LIQD: 1.0000 mg | Freq: Once | ORAL | Status: DC

## 2017-10-09 MED ORDER — DOCUSATE SODIUM 100 MG PO CAPS: 200.0000 mg | ORAL_CAPSULE | Freq: Two times a day (BID) | ORAL | Status: DC

## 2017-10-09 MED ORDER — ONDANSETRON HCL 4 MG/2ML IJ SOLN
4.0000 mg | Freq: Four times a day (QID) | INTRAMUSCULAR | Status: DC | PRN
  Administered 2017-10-10: 4 mg via INTRAVENOUS

## 2017-10-09 MED ORDER — ENOXAPARIN SODIUM 40 MG/0.4ML ~~LOC~~ SOLN: 40.0000 mg | SUBCUTANEOUS | Status: DC

## 2017-10-09 MED ORDER — AMLODIPINE BESYLATE 10 MG PO TABS
10.0000 mg | ORAL_TABLET | Freq: Every day | ORAL | Status: DC
  Administered 2017-10-11: 10 mg via ORAL
  Filled 2017-10-09: qty 1

## 2017-10-09 MED ORDER — CEFAZOLIN SODIUM-DEXTROSE 2-4 GM/100ML-% IV SOLN
2.0000 g | Freq: Once | INTRAVENOUS | Status: AC
  Administered 2017-10-10: 2 g via INTRAVENOUS
  Filled 2017-10-09: qty 100

## 2017-10-09 MED ORDER — PROPRANOLOL HCL ER 60 MG PO CP24
60.0000 mg | ORAL_CAPSULE | Freq: Every day | ORAL | Status: DC
  Administered 2017-10-10: 60 mg via ORAL
  Filled 2017-10-09 (×3): qty 1

## 2017-10-09 MED ORDER — OXYCODONE-ACETAMINOPHEN 5-325 MG PO TABS
1.0000 | ORAL_TABLET | Freq: Three times a day (TID) | ORAL | Status: DC | PRN
  Administered 2017-10-10: 1 via ORAL
  Filled 2017-10-09: qty 1

## 2017-10-09 MED ORDER — OXYCODONE-ACETAMINOPHEN 5-325 MG PO TABS: 1.0000 | ORAL_TABLET | Freq: Three times a day (TID) | ORAL | Status: DC | PRN

## 2017-10-09 NOTE — ED Notes (Signed)
Report given to Kathleene Hazel for [facility], [address], Room 28.

## 2017-10-09 NOTE — H&P (Signed)
History and Physical    [name] JKD:[id number] DOB: [birth year]  DOA: 10/09/2017 PCP: Baxter Hire, MD  Patient coming from: Home  Chief Complaint: Left leg pain  HPI: [name] is a 84 y.o. female with medical history significant of hypertension, presented to the emergency department complaining of left leg pain.  Patient report that she sustained a fall 3 days ago after she slipped going down hill.  She visited the ED at [facility] x-ray was obtained and show spiral fracture of the tibia however she was discharged home with follow-up to orthopedics.  Patient's orthopedist on 10/3 who placed on splint advised no weight bearing on her left leg, however patient was not able to tolerate pain therefore came to [facility] emergency department for further evaluation.  Patient taking pain medication with minimal response.  Denies chest pain, shortness of breath and palpitation.  Help blood pressure has been slight higher in the past few days.  No other complaints at this time.  ED Course:.  Repeated left leg x-ray showed tib-fib fracture, orthopedic was consulted recommending surgical treatment.  Labs otherwise unremarkable.  Review of Systems:   All ROS reviewed and negative except the ones mentioned above.  GU: Complains of urinary incontinence  Past Medical History:  Diagnosis Date  . Anxiety   . Back pain   . Chronic orthostatic hypotension June 2012   with pprior syncopal event,  did not tolelrate florinef trial  . Depression   . Hyperlipidemia   . Hypertension     Past Surgical History:  Procedure Laterality Date  . ANKLE SURGERY Left   . CATARACT EXTRACTION, BILATERAL    . WRIST SURGERY Right      reports that she has never smoked. She has never used smokeless tobacco. She reports that she does not drink alcohol or use drugs.  No Known Allergies  Family History  Problem Relation Age of Onset  . Colon cancer Mother   . Heart disease Father   .  Kidney disease Father   . Cancer Neg Hx   . Diabetes Neg Hx     Prior to Admission medications   Medication Sig Start Date End Date Taking? Authorizing Provider  amLODipine (NORVASC) 10 MG tablet Take 10 mg by mouth daily.   Yes [provider]  aspirin EC 81 MG tablet Take 1 tablet (81 mg total) by mouth daily. 12/04/16  Yes Crecencio Mc, MD  diazepam (VALIUM) 5 MG tablet Take 1.5 tablets (7.5 mg total) by mouth at bedtime as needed (insomnia). for anxiety 12/16/16  Yes Crecencio Mc, MD  docusate sodium (COLACE) 100 MG capsule Take 1 capsule (100 mg total) by mouth 2 (two) times daily. Patient taking differently: Take 200 mg by mouth 2 (two) times daily.  10/31/13  Yes Crecencio Mc, MD  Garlic Oil 2 MG CAPS Take 1 capsule by mouth daily.    Yes [provider]  Iron-Vitamins (GERITOL PO) Take 1 tablet by mouth daily.   Yes [provider]  lactulose (CHRONULAC) 10 GM/15ML solution TAKE 30MLS(0NE OUNCE) BY MOUTH 2 TIMES DAILY AS NEEDED FOR MILD CONSTIPATION. DO NOT USE MORE THAN ONCE A WEEK 08/23/15  Yes Crecencio Mc, MD  Lutein 20 MG CAPS Take 20 mg by mouth daily.     Yes [provider]  oxyCODONE-acetaminophen (PERCOCET) 5-325 MG tablet Take 1 tablet by mouth every 8 (eight) hours as needed. 10/06/17  Yes Earleen Newport, MD  propranolol ER (INDERAL LA) 60 MG 24 hr capsule TAKE ONE CAPSULE BY MOUTH AT BEDTIME Patient taking differently: Take 60 mg by mouth every other day.  05/05/17  Yes Crecencio Mc, MD  VITAMIN E PO Take 400 Units by mouth daily.    Yes [provider]  amLODipine (NORVASC) 5 MG tablet Take 1 tablet (5 mg total) by mouth daily. Patient not taking: Reported on 10/09/2017 12/23/16   Crecencio Mc, MD  hydrOXYzine (ATARAX/VISTARIL) 25 MG tablet TAKE ONE CAPSULE BY MOUTH THREE TIMES DAILY AS NEEDED Patient not taking: Reported on 10/09/2017 09/18/16   Crecencio Mc, MD  pravastatin (PRAVACHOL) 20 MG tablet Take 1  tablet (20 mg total) by mouth daily. Patient not taking: Reported on 10/09/2017 12/16/16   Crecencio Mc, MD  traMADol (ULTRAM) 50 MG tablet Take 1 tablet (50 mg total) every 8 (eight) hours as needed by mouth. Patient not taking: Reported on 10/09/2017 11/11/16   Crecencio Mc, MD    Physical Exam: Vitals:   10/09/17 1400 10/09/17 1430 10/09/17 1530 10/09/17 1601  BP: (!) 149/71 (!) 169/71 (!) 108/95 (!) 148/52  Pulse: 82 (!) 41 91 92  Resp: 19 12 13 16   Temp:      TempSrc:      SpO2: 97% 99% 92% (S) 99%     Constitutional: NAD, pleasant Eyes: PERRL, lids and conjunctivae normal ENMT: Mucous membranes are moist. Posterior pharynx clear of any exudate or lesions.  Neck: normal, supple, no masses, no thyromegaly Respiratory: clear to auscultation bilaterally, no wheezing, no crackles.  Cardiovascular: RRR, no murmurs / rubs / gallops. No extremity edema. 2+ pedal pulses. Abdomen: no tenderness, no masses palpated. No hepatosplenomegaly. Bowel sounds positive.  Musculoskeletal: Left lower extremity on splint from toes up to inguinal area.  Able to wiggle toes with no pain.  Sensation intact. Skin: no rashes Neurologic: CN 2-12 grossly intact.  Psychiatric: Normal judgment and insight. Alert and oriented x 3. Normal mood.   Labs on Admission: I have personally reviewed following labs and imaging studies  CBC: Recent Labs  Lab 10/06/17 1029 10/09/17 1342  WBC 8.8 10.6*  NEUTROABS 6.4  --   HGB 13.2 12.1  HCT 37.3 35.5*  MCV 100.7* 97.5  PLT 270 094   Basic Metabolic Panel: Recent Labs  Lab 10/06/17 1029 10/09/17 1342  NA 135 139  K 4.6 3.9  CL 100 99  CO2 28 28  GLUCOSE 98 105*  BUN 13 13  CREATININE 0.79 0.76  CALCIUM 9.2 9.3   GFR: Estimated Creatinine Clearance: 43.9 mL/min (by C-G formula based on SCr of 0.76 mg/dL). Liver Function Tests: Recent Labs  Lab 10/06/17 1029  AST 22  ALT 12  ALKPHOS 42  BILITOT 0.7  PROT 7.2  ALBUMIN 4.3   No results  for input(s): LIPASE, AMYLASE in the last 168 hours. No results for input(s): AMMONIA in the last 168 hours. Coagulation Profile: No results for input(s): INR, PROTIME in the last 168 hours. Cardiac Enzymes: No results for input(s): CKTOTAL, CKMB, CKMBINDEX, TROPONINI in the last 168 hours. BNP (last 3 results) No results for input(s): PROBNP in the last 8760 hours. HbA1C: No results for input(s): HGBA1C in the last 72 hours. CBG: No results for input(s): GLUCAP in the last 168 hours. Lipid Profile: No results for input(s): CHOL, HDL, LDLCALC, TRIG, CHOLHDL, LDLDIRECT in the last 72 hours. Thyroid Function Tests: No results for input(s): TSH, T4TOTAL, FREET4, T3FREE, THYROIDAB in  the last 72 hours. Anemia Panel: No results for input(s): VITAMINB12, FOLATE, FERRITIN, TIBC, IRON, RETICCTPCT in the last 72 hours. Urine analysis:    Component Value Date/Time   COLORURINE YELLOW 02/01/2017 0901   APPEARANCEUR CLOUDY (A) 02/01/2017 0901   LABSPEC 1.010 02/01/2017 0901   PHURINE 7.0 02/01/2017 0901   GLUCOSEU NEGATIVE 02/01/2017 0901   GLUCOSEU NEGATIVE 11/28/2014 1341   HGBUR MODERATE (A) 02/01/2017 0901   BILIRUBINUR NEGATIVE 02/01/2017 0901   BILIRUBINUR neg 12/16/2016 1448   KETONESUR NEGATIVE 02/01/2017 0901   PROTEINUR TRACE (A) 02/01/2017 0901   UROBILINOGEN 0.2 12/16/2016 1448   UROBILINOGEN 0.2 11/28/2014 1341   NITRITE POSITIVE (A) 02/01/2017 0901   LEUKOCYTESUR LARGE (A) 02/01/2017 0901   Sepsis Labs: !!!!!!!!!!!!!!!!!!!!!!!!!!!!!!!!!!!!!!!!!!!! @LABRCNTIP (procalcitonin:4,lacticidven:4) )No results found for this or any previous visit (from the past 240 hour(s)).   Radiological Exams on Admission: Dg Tibia/fibula Left  Result Date: 10/09/2017 CLINICAL DATA:  Known fracture.  Worsening pain. EXAM: LEFT TIBIA AND FIBULA - 2 VIEW COMPARISON:  10/06/2017 FINDINGS: Displaced comminuted fracture noted through the distal tibial metaphysis and proximal to mid fibular shaft.  These are essentially unchanged since prior study. Plate and screw fixation device noted in the distal fibula with screws in the medial malleolus from remote injury. IMPRESSION: Displaced, comminuted distal tibial metaphyseal and fibular shaft fractures, stable. Electronically Signed   By: Rolm Baptise M.D.   On: 10/09/2017 12:57   Dg Chest Port 1 View  Result Date: 10/09/2017 CLINICAL DATA:  84 year old female with a history of leg fracture EXAM: PORTABLE CHEST 1 VIEW COMPARISON:  04/09/2010 FINDINGS: Cardiomediastinal silhouette unchanged in size and contour. No pneumothorax. No pleural effusion. No confluent airspace disease. Similar appearance of chronic interstitial opacities. No displaced fracture. IMPRESSION: Chronic lung changes without evidence of acute cardiopulmonary disease. Electronically Signed   By: Corrie Mckusick D.O.   On: 10/09/2017 14:25    EKG: Independently reviewed.  Normal sinus rhythm, poor R wave progression.  Assessment/Plan Left comminuted tib-fib fracture displaced  Status post mechanical fall, orthopedic Dr Erlinda Hong tolerated patient and recommending surgical treatment.  Patient will be admitted to [place] [facility].  Patient scheduled for surgery in a.m.  Patient was placed on splint in the ED.  Will continue supportive treatment with pain control as needed.  Bedrest.  Follow orthopedic surgery recommendation  Essential hypertension BP slight above goal, this felt to be related to to pain.  We will continue home medications with amlodipine 10 mg daily and propanolol 60 mg nightly.  Will add hydralazine PRN for SBP above 170 or DBP above 100.  Monitor BP closely  Leukocytosis Likely reactive from fracture, no source of infections.  Continue to monitor.  DVT prophylaxis: Lovenox Code Status: Full code Family Communication: None at bedside Disposition Plan: Anticipate discharge to previous home environment.  Consults called: Orthopedic surgery - Dr. Erlinda Hong    Admission status: Inpatient for surgical procedure, pain control, monitoring postop and PT evaluation.   Chipper Oman MD [facility] Pager: Text Page via [email or web address]  [phone]  If 7PM-7AM, please contact night-coverage [email or web address]  10/09/2017, 4:20 PM

## 2017-10-09 NOTE — ED Notes (Signed)
Dr. Erlinda Hong is working with her now to apply a left leg splint. I give her IV pain med. Per his order at this time. Our ortho. Tech. Is here also.

## 2017-10-09 NOTE — ED Notes (Signed)
[facility] has been notified.

## 2017-10-09 NOTE — ED Provider Notes (Addendum)
[facility] DEPT Provider Note   CSN: [id number] Arrival date & time: 10/09/17  1109     History   Chief Complaint Chief Complaint  Patient presents with  . Leg Pain    HPI [name] is a 84 y.o. female.  HPI  85 year old female who fell in a ditch on Wednesday.  She injured her left lower extremity.  She was seen at [facility].  X-Georgeanne Frankland obtained and showed a spiral fracture of the tibia.  Patient was referred for follow-up to orthopedist.  She reports that she saw the orthopedist in the office and he told her to have the splint in place and not put weight on the leg.  She states that she is having difficulty not putting weight on the leg and it continues to hurt.  She feels that she should have had a repair done so that she could put weight on the leg and presents for evaluation here today due to this.  Past Medical History:  Diagnosis Date  . Anxiety   . Back pain   . Chronic orthostatic hypotension June 2012   with pprior syncopal event,  did not tolelrate florinef trial  . Depression   . Hyperlipidemia   . Hypertension     Patient Active Problem List   Diagnosis Date Noted  . 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 Iliac bone pain 07/29/2015  . Osteoporosis 03/13/2015  . Raynaud phenomenon 02/24/2015  . Hyponatremia 02/09/2015  . Urinary frequency 01/30/2015  . Medicare annual wellness visit, subsequent 01/06/2015  . Encounter for preventive health examination 01/06/2015  . Underweight 06/15/2014  . Chronic left hip pain 06/13/2014  . Benign essential tremor 10/02/2013  . Constipation 08/12/2013  . Urinary incontinence, urge 05/29/2012  . Nocturnal leg cramps 12/15/2011  . Screening for breast cancer 10/27/2011  . Degeneration of lumbar or lumbosacral intervertebral disc 09/01/2011  . Insomnia 01/07/2011  . Hyperlipidemia LDL goal <130 11/08/2010  . Back pain, sacroiliac 11/08/2010  . Essential  hypertension 09/18/2010    Past Surgical History:  Procedure Laterality Date  . ANKLE SURGERY Left   . CATARACT EXTRACTION, BILATERAL    . WRIST SURGERY Right      OB History   None      Home Medications    Prior to Admission medications   Medication Sig Start Date End Date Taking? Authorizing Provider  amLODipine (NORVASC) 5 MG tablet Take 1 tablet (5 mg total) by mouth daily. 12/23/16   Crecencio Mc, MD  aspirin EC 81 MG tablet Take 1 tablet (81 mg total) by mouth daily. 12/04/16   Crecencio Mc, MD  diazepam (VALIUM) 5 MG tablet Take 1.5 tablets (7.5 mg total) by mouth at bedtime as needed (insomnia). for anxiety 12/16/16   Crecencio Mc, MD  docusate sodium (COLACE) 100 MG capsule Take 1 capsule (100 mg total) by mouth 2 (two) times daily. Patient taking differently: Take 200 mg by mouth 2 (two) times daily.  10/31/13   Crecencio Mc, MD  Garlic Oil 2 MG CAPS Take by mouth.    [provider]  hydrOXYzine (ATARAX/VISTARIL) 25 MG tablet TAKE ONE CAPSULE BY MOUTH THREE TIMES DAILY AS NEEDED 09/18/16   Crecencio Mc, MD  Iron-Vitamins (GERITOL PO) Take 1 tablet by mouth daily.    [provider]  lactulose (CHRONULAC) 10 GM/15ML solution TAKE 30MLS(0NE OUNCE) BY MOUTH 2 TIMES DAILY AS NEEDED FOR MILD CONSTIPATION. DO  NOT USE MORE THAN ONCE A WEEK 08/23/15   Crecencio Mc, MD  Lutein 20 MG CAPS Take 20 mg by mouth daily.      [provider]  magnesium oxide (MAG-OX) 400 MG tablet Take 400 mg by mouth.    [provider]  oxyCODONE-acetaminophen (PERCOCET) 5-325 MG tablet Take 1 tablet by mouth every 8 (eight) hours as needed. 10/06/17   Earleen Newport, MD  Potassium Gluconate 2 MEQ TABS Take by mouth.    [provider]  pravastatin (PRAVACHOL) 20 MG tablet Take 1 tablet (20 mg total) by mouth daily. 12/16/16   Crecencio Mc, MD  propranolol ER (INDERAL LA) 60 MG 24 hr capsule TAKE ONE CAPSULE BY MOUTH AT BEDTIME 05/05/17    Crecencio Mc, MD  traMADol (ULTRAM) 50 MG tablet Take 1 tablet (50 mg total) every 8 (eight) hours as needed by mouth. 11/11/16   Crecencio Mc, MD  VITAMIN E PO Take 400 Units by mouth daily.     [provider]    Family History Family History  Problem Relation Age of Onset  . Colon cancer Mother   . Heart disease Father   . Kidney disease Father   . Cancer Neg Hx   . Diabetes Neg Hx     Social History Social History   Tobacco Use  . Smoking status: Never Smoker  . Smokeless tobacco: Never Used  Substance Use Topics  . Alcohol use: No  . Drug use: No     Allergies   Patient has no known allergies.   Review of Systems Review of Systems   Physical Exam Updated Vital Signs BP 136/78 (BP Location: Right Arm)   Pulse 79   Temp 98.9 F (37.2 C) (Oral)   Resp 19   SpO2 99%   Physical Exam  Constitutional: She is oriented to person, place, and time. She appears well-developed and well-nourished.  HENT:  Head: Normocephalic and atraumatic.  Right Ear: External ear normal.  Left Ear: External ear normal.  Eyes: Pupils are equal, round, and reactive to light.  Neck: Normal range of motion.  Cardiovascular: Normal rate and regular rhythm.  Pulmonary/Chest: Breath sounds normal.  Abdominal: Soft. Bowel sounds are normal.  Musculoskeletal: Normal range of motion.  lle with skin break down over medial lower third with blistering dp intact sensatin intact  Neurological: She is alert and oriented to person, place, and time.  Skin: Skin is warm and dry. Capillary refill takes less than 2 seconds.  Psychiatric: She has a normal mood and affect.  Nursing note and vitals reviewed.    ED Treatments / Results  Labs (all labs ordered are listed, but only abnormal results are displayed) Labs Reviewed - No data to display  EKG EKG Interpretation  Date/Time:  Saturday October 09 2017 14:09:09 EDT Ventricular Rate:  69 PR Interval:    QRS  Duration: 89 QT Interval:  405 QTC Calculation: 434 R Axis:   51 Text Interpretation:  Normal sinus rhythm Premature ventricular complexes ABNORMAL R WAVE PROGRESSION Confirmed by Pattricia Boss [phone]) on 10/09/2017 2:36:47 PM   Radiology Dg Tibia/fibula Left  Result Date: 10/09/2017 CLINICAL DATA:  Known fracture.  Worsening pain. EXAM: LEFT TIBIA AND FIBULA - 2 VIEW COMPARISON:  10/06/2017 FINDINGS: Displaced comminuted fracture noted through the distal tibial metaphysis and proximal to mid fibular shaft. These are essentially unchanged since prior study. Plate and screw fixation device noted in the distal fibula with  screws in the medial malleolus from remote injury. IMPRESSION: Displaced, comminuted distal tibial metaphyseal and fibular shaft fractures, stable. Electronically Signed   By: Rolm Baptise M.D.   On: 10/09/2017 12:57   Dg Chest Port 1 View  Result Date: 10/09/2017 CLINICAL DATA:  84 year old female with a history of leg fracture EXAM: PORTABLE CHEST 1 VIEW COMPARISON:  04/09/2010 FINDINGS: Cardiomediastinal silhouette unchanged in size and contour. No pneumothorax. No pleural effusion. No confluent airspace disease. Similar appearance of chronic interstitial opacities. No displaced fracture. IMPRESSION: Chronic lung changes without evidence of acute cardiopulmonary disease. Electronically Signed   By: Corrie Mckusick D.O.   On: 10/09/2017 14:25    Procedures Procedures (including critical care time)  Medications Ordered in ED Medications - No data to display   Initial Impression / Assessment and Plan / ED Course  I have reviewed the triage vital signs and the nursing notes.  Pertinent labs & imaging results that were available during my care of the patient were reviewed by me and considered in my medical decision making (see chart for details).    84 year old female who lives independently presents today complaining of recent fracture to her left lower extremity.  Chart  reviewed and x-Mariel Lukins from Wednesday reviewed.  Discussed with Dr.Xu and he will be in to evaluate.  I have discussed this with the patient and she is in understanding of plan.  She has been n.p.o. since 7 AM with the exception of 2 Percocet with a sip of water here. Discussed with Dr. Erlinda Hong and would like hospitalist to admit to [facility] .  Surgery planned for am  Final Clinical Impressions(s) / ED Diagnoses   Final diagnoses:  Closed nondisplaced comminuted fracture of shaft of left tibia with routine healing, subsequent encounter  Closed fracture of shaft of left fibula with routine healing, unspecified fracture morphology, subsequent encounter    ED Discharge Orders    None       Pattricia Boss, MD 10/09/17 1439    Pattricia Boss, MD 10/09/17 1510    Pattricia Boss, MD 10/09/17 1551

## 2017-10-09 NOTE — ED Triage Notes (Signed)
Patient here from home with complaints of left leg pain. Reports that she was admitted to a hospital in [place] due to leg fracture and was unhappy how she was treated. Reports that "they should have set it before sending me home".

## 2017-10-09 NOTE — Consult Note (Signed)
ORTHOPAEDIC CONSULTATION  REQUESTING PHYSICIAN: Pattricia Boss, MD  Chief Complaint: Left tib-fib fx  HPI: [name] is a 84 y.o. female who presents with left tib fib fx from 3 days ago when she slipped on wet leaves.  She presented to [place] [facility] ED and later saw Dr. Kurtis Bushman who recommended nonoperative treatment.  She lives alone and is fully independent but has had a lot of difficulty with NWB.  She has continued to have significant pain and difficulty and has now developed worsening deformity, fracture blisters.  She was unhappy with her care at [facility] and the follow up care so she presented to our ED today.  I was consulted for orthopedic evaluation.  Past Medical History:  Diagnosis Date  . Anxiety   . Back pain   . Chronic orthostatic hypotension June 2012   with pprior syncopal event,  did not tolelrate florinef trial  . Depression   . Hyperlipidemia   . Hypertension    Past Surgical History:  Procedure Laterality Date  . ANKLE SURGERY Left   . CATARACT EXTRACTION, BILATERAL    . WRIST SURGERY Right    Social History   Socioeconomic History  . Marital status: Married    Spouse name: Not on file  . Number of children: Not on file  . Years of education: Not on file  . Highest education level: Not on file  Occupational History  . 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 Financial resource strain: Not on file  . Food insecurity:    Worry: Not on file    Inability: Not on file  . Transportation needs:    Medical: Not on file    Non-medical: Not on file  Tobacco Use  . Smoking status: Never Smoker  . Smokeless tobacco: Never Used  Substance and Sexual Activity  . Alcohol use: No  . Drug use: No  . Sexual activity: Not Currently  Lifestyle  . Physical activity:    Days per week: Not on file    Minutes per session: Not on file  . Stress: Not on file  Relationships  .  Social connections:    Talks on phone: Not on file    Gets together: Not on file    Attends religious service: Not on file    Active member of club or organization: Not on file    Attends meetings of clubs or organizations: Not on file    Relationship status: Not on file  Other Topics Concern  . Not on file  Social History Narrative  . Not on file   Family History  Problem Relation Age of Onset  . Colon cancer Mother   . Heart disease Father   . Kidney disease Father   . Cancer Neg Hx   . Diabetes Neg Hx    - negative except otherwise stated in the family history section No Known Allergies Prior to Admission medications   Medication Sig Start Date End Date Taking? Authorizing Provider  amLODipine (NORVASC) 10 MG tablet Take 10 mg by mouth daily.   Yes [provider]  aspirin EC 81 MG tablet Take 1 tablet (81 mg total) by mouth daily. 12/04/16  Yes Crecencio Mc, MD  diazepam (VALIUM) 5 MG tablet Take 1.5 tablets (7.5 mg total) by mouth at bedtime as needed (insomnia). for anxiety 12/16/16  Yes Crecencio Mc, MD  docusate sodium (COLACE)  100 MG capsule Take 1 capsule (100 mg total) by mouth 2 (two) times daily. Patient taking differently: Take 200 mg by mouth 2 (two) times daily.  10/31/13  Yes Crecencio Mc, MD  Garlic Oil 2 MG CAPS Take 1 capsule by mouth daily.    Yes [provider]  Iron-Vitamins (GERITOL PO) Take 1 tablet by mouth daily.   Yes [provider]  lactulose (CHRONULAC) 10 GM/15ML solution TAKE 30MLS(0NE OUNCE) BY MOUTH 2 TIMES DAILY AS NEEDED FOR MILD CONSTIPATION. DO NOT USE MORE THAN ONCE A WEEK 08/23/15  Yes Crecencio Mc, MD  Lutein 20 MG CAPS Take 20 mg by mouth daily.     Yes [provider]  oxyCODONE-acetaminophen (PERCOCET) 5-325 MG tablet Take 1 tablet by mouth every 8 (eight) hours as needed. 10/06/17  Yes Earleen Newport, MD  propranolol ER (INDERAL LA) 60 MG 24 hr capsule TAKE ONE CAPSULE BY MOUTH AT  BEDTIME Patient taking differently: Take 60 mg by mouth every other day.  05/05/17  Yes Crecencio Mc, MD  VITAMIN E PO Take 400 Units by mouth daily.    Yes [provider]  amLODipine (NORVASC) 5 MG tablet Take 1 tablet (5 mg total) by mouth daily. Patient not taking: Reported on 10/09/2017 12/23/16   Crecencio Mc, MD  hydrOXYzine (ATARAX/VISTARIL) 25 MG tablet TAKE ONE CAPSULE BY MOUTH THREE TIMES DAILY AS NEEDED Patient not taking: Reported on 10/09/2017 09/18/16   Crecencio Mc, MD  pravastatin (PRAVACHOL) 20 MG tablet Take 1 tablet (20 mg total) by mouth daily. Patient not taking: Reported on 10/09/2017 12/16/16   Crecencio Mc, MD  traMADol (ULTRAM) 50 MG tablet Take 1 tablet (50 mg total) every 8 (eight) hours as needed by mouth. Patient not taking: Reported on 10/09/2017 11/11/16   Crecencio Mc, MD   Dg Tibia/fibula Left  Result Date: 10/09/2017 CLINICAL DATA:  Known fracture.  Worsening pain. EXAM: LEFT TIBIA AND FIBULA - 2 VIEW COMPARISON:  10/06/2017 FINDINGS: Displaced comminuted fracture noted through the distal tibial metaphysis and proximal to mid fibular shaft. These are essentially unchanged since prior study. Plate and screw fixation device noted in the distal fibula with screws in the medial malleolus from remote injury. IMPRESSION: Displaced, comminuted distal tibial metaphyseal and fibular shaft fractures, stable. Electronically Signed   By: Rolm Baptise M.D.   On: 10/09/2017 12:57   Dg Chest Port 1 View  Result Date: 10/09/2017 CLINICAL DATA:  84 year old female with a history of leg fracture EXAM: PORTABLE CHEST 1 VIEW COMPARISON:  04/09/2010 FINDINGS: Cardiomediastinal silhouette unchanged in size and contour. No pneumothorax. No pleural effusion. No confluent airspace disease. Similar appearance of chronic interstitial opacities. No displaced fracture. IMPRESSION: Chronic lung changes without evidence of acute cardiopulmonary disease. Electronically Signed    By: Corrie Mckusick D.O.   On: 10/09/2017 14:25   - pertinent xrays, CT, MRI studies were reviewed and independently interpreted  Positive ROS: All other systems have been reviewed and were otherwise negative with the exception of those mentioned in the HPI and as above.  Physical Exam: General: Alert, no acute distress Cardiovascular: No pedal edema Respiratory: No cyanosis, no use of accessory musculature GI: No organomegaly, abdomen is soft and non-tender Skin: No lesions in the area of chief complaint Neurologic: Sensation intact distally Psychiatric: Patient is competent for consent with normal mood and affect Lymphatic: No axillary or cervical lymphadenopathy  MUSCULOSKELETAL:  - compartments soft - NVI -  2 small fracture blisters - tenting of medial skin - foot wwp  Assessment: Left displaced unstable tibia fracture  Plan: - closed reduction and splinting performed in the ED - recommend surgical stabilization and discussed r/b/a, patient agrees to proceed - plan is for surgery Sunday morning - admit to medicine for medical optimization - NPO after midnight  Thank you for the consult and the opportunity to see Ms. [employer]. Eduard Roux, MD [place] 3:15 PM

## 2017-10-10 ENCOUNTER — Other Ambulatory Visit: Payer: Self-pay

## 2017-10-10 ENCOUNTER — Inpatient Hospital Stay (HOSPITAL_COMMUNITY): Payer: Medicare Other | Admitting: Certified Registered Nurse Anesthetist

## 2017-10-10 ENCOUNTER — Encounter (HOSPITAL_COMMUNITY): Payer: Self-pay | Admitting: Anesthesiology

## 2017-10-10 ENCOUNTER — Inpatient Hospital Stay (HOSPITAL_COMMUNITY): Payer: Medicare Other

## 2017-10-10 ENCOUNTER — Encounter (HOSPITAL_COMMUNITY)
Admission: EM | Disposition: A | Discharge status: Home Health | Payer: Self-pay | Source: Home / Self Care | Attending: Internal Medicine

## 2017-10-10 DIAGNOSIS — S82402D Unspecified fracture of shaft of left fibula, subsequent encounter for closed fracture with routine healing: Secondary | ICD-10-CM

## 2017-10-10 DIAGNOSIS — S82255D Nondisplaced comminuted fracture of shaft of left tibia, subsequent encounter for closed fracture with routine healing: Secondary | ICD-10-CM

## 2017-10-10 DIAGNOSIS — D638 Anemia in other chronic diseases classified elsewhere: Secondary | ICD-10-CM

## 2017-10-10 HISTORY — PX: TIBIA IM NAIL INSERTION: SHX2516

## 2017-10-10 HISTORY — PX: IM NAILING TIBIA: SUR734

## 2017-10-10 LAB — CBC
HCT: 33.8 % — ABNORMAL LOW (ref 36.0–46.0)
Hemoglobin: 11.4 g/dL — ABNORMAL LOW (ref 12.0–15.0)
MCH: 33.4 pg (ref 26.0–34.0)
MCHC: 33.7 g/dL (ref 30.0–36.0)
MCV: 99.1 fL (ref 78.0–100.0)
Platelets: 240 10*3/uL (ref 150–400)
RBC: 3.41 MIL/uL — ABNORMAL LOW (ref 3.87–5.11)
RDW: 12 % (ref 11.5–15.5)
WBC: 11.9 10*3/uL — ABNORMAL HIGH (ref 4.0–10.5)

## 2017-10-10 LAB — CREATININE, SERUM
Creatinine, Ser: 0.78 mg/dL (ref 0.44–1.00)
GFR calc Af Amer: >60 mL/min (ref >60)
GFR calc non Af Amer: >60 mL/min (ref >60)

## 2017-10-10 SURGERY — INSERTION, INTRAMEDULLARY ROD, TIBIA
Anesthesia: General | Site: Leg Lower | Laterality: Left

## 2017-10-10 MED ORDER — SUGAMMADEX SODIUM 200 MG/2ML IV SOLN
INTRAVENOUS | Status: DC | PRN
  Administered 2017-10-10: 200 mg via INTRAVENOUS

## 2017-10-10 MED ORDER — PHENYLEPHRINE 40 MCG/ML (10ML) SYRINGE FOR IV PUSH (FOR BLOOD PRESSURE SUPPORT): PREFILLED_SYRINGE | INTRAVENOUS | Status: DC | PRN

## 2017-10-10 MED ORDER — ROCURONIUM BROMIDE 10 MG/ML (PF) SYRINGE
PREFILLED_SYRINGE | INTRAVENOUS | Status: DC | PRN
  Administered 2017-10-10: 30 mg via INTRAVENOUS
  Administered 2017-10-10: 10 mg via INTRAVENOUS

## 2017-10-10 MED ORDER — ROCURONIUM BROMIDE 50 MG/5ML IV SOSY
PREFILLED_SYRINGE | INTRAVENOUS | Status: AC
  Filled 2017-10-10: qty 5

## 2017-10-10 MED ORDER — SORBITOL 70 % SOLN: 30.0000 mL | Freq: Every day | Status: DC | PRN

## 2017-10-10 MED ORDER — LIDOCAINE 2% (20 MG/ML) 5 ML SYRINGE
INTRAMUSCULAR | Status: AC
  Filled 2017-10-10: qty 5

## 2017-10-10 MED ORDER — DOCUSATE SODIUM 100 MG PO CAPS
100.0000 mg | ORAL_CAPSULE | Freq: Two times a day (BID) | ORAL | Status: DC
  Administered 2017-10-10: 100 mg via ORAL

## 2017-10-10 MED ORDER — ENOXAPARIN SODIUM 40 MG/0.4ML ~~LOC~~ SOLN: 40.0000 mg | Freq: Every day | SUBCUTANEOUS | 0 refills | Status: DC

## 2017-10-10 MED ORDER — DEXAMETHASONE SODIUM PHOSPHATE 10 MG/ML IJ SOLN
INTRAMUSCULAR | Status: DC | PRN
  Administered 2017-10-10: 10 mg via INTRAVENOUS

## 2017-10-10 MED ORDER — SUCCINYLCHOLINE CHLORIDE 200 MG/10ML IV SOSY
PREFILLED_SYRINGE | INTRAVENOUS | Status: AC
  Filled 2017-10-10: qty 10

## 2017-10-10 MED ORDER — ACETAMINOPHEN 325 MG PO TABS: 325.0000 mg | ORAL_TABLET | Freq: Four times a day (QID) | ORAL | Status: DC | PRN

## 2017-10-10 MED ORDER — SODIUM CHLORIDE 0.9 % IV SOLN
INTRAVENOUS | Status: DC | PRN
  Administered 2017-10-10: 20 ug/min via INTRAVENOUS

## 2017-10-10 MED ORDER — MORPHINE SULFATE (PF) 2 MG/ML IV SOLN: 0.5000 mg | INTRAVENOUS | Status: DC | PRN

## 2017-10-10 MED ORDER — OXYCODONE HCL 5 MG PO TABS: 5.0000 mg | ORAL_TABLET | Freq: Once | ORAL | Status: DC | PRN

## 2017-10-10 MED ORDER — PHENYLEPHRINE HCL 10 MG/ML IJ SOLN: INTRAMUSCULAR | Status: DC | PRN

## 2017-10-10 MED ORDER — PROPOFOL 10 MG/ML IV BOLUS
INTRAVENOUS | Status: AC
  Filled 2017-10-10: qty 20

## 2017-10-10 MED ORDER — GLYCOPYRROLATE PF 0.2 MG/ML IJ SOSY
PREFILLED_SYRINGE | INTRAMUSCULAR | Status: AC
  Filled 2017-10-10: qty 1

## 2017-10-10 MED ORDER — ONDANSETRON HCL 4 MG/2ML IJ SOLN: 4.0000 mg | Freq: Four times a day (QID) | INTRAMUSCULAR | Status: DC | PRN

## 2017-10-10 MED ORDER — ENOXAPARIN SODIUM 40 MG/0.4ML ~~LOC~~ SOLN
40.0000 mg | SUBCUTANEOUS | Status: DC
  Administered 2017-10-11: 40 mg via SUBCUTANEOUS
  Filled 2017-10-10: qty 0.4

## 2017-10-10 MED ORDER — METOCLOPRAMIDE HCL 5 MG/ML IJ SOLN: 5.0000 mg | Freq: Three times a day (TID) | INTRAMUSCULAR | Status: DC | PRN

## 2017-10-10 MED ORDER — FENTANYL CITRATE (PF) 250 MCG/5ML IJ SOLN
INTRAMUSCULAR | Status: AC
  Filled 2017-10-10: qty 5

## 2017-10-10 MED ORDER — POLYETHYLENE GLYCOL 3350 17 G PO PACK: 17.0000 g | PACK | Freq: Every day | ORAL | Status: DC | PRN

## 2017-10-10 MED ORDER — LACTATED RINGERS IV SOLN
INTRAVENOUS | Status: DC | PRN
  Administered 2017-10-10: 08:00:00 via INTRAVENOUS

## 2017-10-10 MED ORDER — SENNOSIDES-DOCUSATE SODIUM 8.6-50 MG PO TABS
1.0000 | ORAL_TABLET | Freq: Two times a day (BID) | ORAL | Status: DC
  Administered 2017-10-10 – 2017-10-11 (×3): 1 via ORAL
  Filled 2017-10-10 (×2): qty 1

## 2017-10-10 MED ORDER — ONDANSETRON HCL 4 MG PO TABS: 4.0000 mg | ORAL_TABLET | Freq: Four times a day (QID) | ORAL | Status: DC | PRN

## 2017-10-10 MED ORDER — DEXAMETHASONE SODIUM PHOSPHATE 10 MG/ML IJ SOLN
INTRAMUSCULAR | Status: AC
  Filled 2017-10-10: qty 1

## 2017-10-10 MED ORDER — MAGNESIUM CITRATE PO SOLN: 1.0000 | Freq: Once | ORAL | Status: DC | PRN

## 2017-10-10 MED ORDER — CEFAZOLIN SODIUM-DEXTROSE 2-4 GM/100ML-% IV SOLN
2.0000 g | Freq: Four times a day (QID) | INTRAVENOUS | Status: AC
  Administered 2017-10-10 – 2017-10-11 (×3): 2 g via INTRAVENOUS
  Filled 2017-10-10 (×3): qty 100

## 2017-10-10 MED ORDER — FENTANYL CITRATE (PF) 100 MCG/2ML IJ SOLN
25.0000 ug | INTRAMUSCULAR | Status: DC | PRN
  Administered 2017-10-10 (×2): 25 ug via INTRAVENOUS

## 2017-10-10 MED ORDER — EPHEDRINE 5 MG/ML INJ
INTRAVENOUS | Status: AC
  Filled 2017-10-10: qty 10

## 2017-10-10 MED ORDER — OXYCODONE HCL 5 MG/5ML PO SOLN: 5.0000 mg | Freq: Once | ORAL | Status: DC | PRN

## 2017-10-10 MED ORDER — HYDROCODONE-ACETAMINOPHEN 7.5-325 MG PO TABS: 1.0000 | ORAL_TABLET | Freq: Four times a day (QID) | ORAL | 0 refills | Status: DC | PRN

## 2017-10-10 MED ORDER — METHOCARBAMOL 1000 MG/10ML IJ SOLN
500.0000 mg | Freq: Four times a day (QID) | INTRAVENOUS | Status: DC | PRN
  Filled 2017-10-10: qty 5

## 2017-10-10 MED ORDER — PROPOFOL 10 MG/ML IV BOLUS
INTRAVENOUS | Status: DC | PRN
  Administered 2017-10-10: 40 mg via INTRAVENOUS
  Administered 2017-10-10: 20 mg via INTRAVENOUS

## 2017-10-10 MED ORDER — FENTANYL CITRATE (PF) 250 MCG/5ML IJ SOLN
INTRAMUSCULAR | Status: DC | PRN
  Administered 2017-10-10 (×2): 50 ug via INTRAVENOUS

## 2017-10-10 MED ORDER — FENTANYL CITRATE (PF) 100 MCG/2ML IJ SOLN
INTRAMUSCULAR | Status: AC
  Administered 2017-10-10: 25 ug via INTRAVENOUS
  Filled 2017-10-10: qty 2

## 2017-10-10 MED ORDER — PHENYLEPHRINE 40 MCG/ML (10ML) SYRINGE FOR IV PUSH (FOR BLOOD PRESSURE SUPPORT)
PREFILLED_SYRINGE | INTRAVENOUS | Status: AC
  Filled 2017-10-10: qty 10

## 2017-10-10 MED ORDER — METHOCARBAMOL 500 MG PO TABS
ORAL_TABLET | ORAL | Status: AC
  Administered 2017-10-10: 500 mg via ORAL
  Filled 2017-10-10: qty 1

## 2017-10-10 MED ORDER — METOCLOPRAMIDE HCL 5 MG PO TABS: 5.0000 mg | ORAL_TABLET | Freq: Three times a day (TID) | ORAL | Status: DC | PRN

## 2017-10-10 MED ORDER — ACETAMINOPHEN 500 MG PO TABS
500.0000 mg | ORAL_TABLET | Freq: Four times a day (QID) | ORAL | Status: AC
  Administered 2017-10-10 – 2017-10-11 (×3): 500 mg via ORAL
  Filled 2017-10-10 (×2): qty 1

## 2017-10-10 MED ORDER — HYDROCODONE-ACETAMINOPHEN 5-325 MG PO TABS: 1.0000 | ORAL_TABLET | ORAL | Status: DC | PRN

## 2017-10-10 MED ORDER — HYDROCODONE-ACETAMINOPHEN 7.5-325 MG PO TABS: 1.0000 | ORAL_TABLET | ORAL | Status: DC | PRN

## 2017-10-10 MED ORDER — LIDOCAINE 2% (20 MG/ML) 5 ML SYRINGE
INTRAMUSCULAR | Status: DC | PRN
  Administered 2017-10-10: 50 mg via INTRAVENOUS

## 2017-10-10 MED ORDER — 0.9 % SODIUM CHLORIDE (POUR BTL) OPTIME
TOPICAL | Status: DC | PRN
  Administered 2017-10-10: 1000 mL

## 2017-10-10 MED ORDER — METHOCARBAMOL 500 MG PO TABS
500.0000 mg | ORAL_TABLET | Freq: Four times a day (QID) | ORAL | Status: DC | PRN
  Administered 2017-10-10: 500 mg via ORAL

## 2017-10-10 MED ORDER — DIPHENHYDRAMINE HCL 12.5 MG/5ML PO ELIX
25.0000 mg | ORAL_SOLUTION | ORAL | Status: DC | PRN
  Administered 2017-10-11: 25 mg via ORAL
  Filled 2017-10-10: qty 10

## 2017-10-10 MED ORDER — SODIUM CHLORIDE 0.9 % IV SOLN
INTRAVENOUS | Status: DC
  Administered 2017-10-10 – 2017-10-11 (×2): via INTRAVENOUS

## 2017-10-10 MED ORDER — PHENYLEPHRINE 40 MCG/ML (10ML) SYRINGE FOR IV PUSH (FOR BLOOD PRESSURE SUPPORT)
PREFILLED_SYRINGE | INTRAVENOUS | Status: DC | PRN
  Administered 2017-10-10 (×2): 40 ug via INTRAVENOUS

## 2017-10-10 MED ORDER — ONDANSETRON HCL 4 MG/2ML IJ SOLN
INTRAMUSCULAR | Status: AC
  Filled 2017-10-10: qty 2

## 2017-10-10 SURGICAL SUPPLY — 54 items
BANDAGE ACE 4X5 VEL STRL LF (GAUZE/BANDAGES/DRESSINGS) ×2 IMPLANT
BANDAGE ACE 6X5 VEL STRL LF (GAUZE/BANDAGES/DRESSINGS) ×2 IMPLANT
BIT DRILL AO GAMMA 4.2X130 (BIT) ×2 IMPLANT
BIT DRILL AO GAMMA 4.2X180 (BIT) ×2 IMPLANT
BIT DRILL AO GAMMA 4.2X340 (BIT) ×2 IMPLANT
COVER SURGICAL LIGHT HANDLE (MISCELLANEOUS) ×2 IMPLANT
CUFF TOURNIQUET SINGLE 34IN LL (TOURNIQUET CUFF) ×2 IMPLANT
DRAPE C-ARM 42X72 X-RAY (DRAPES) ×2 IMPLANT
DRAPE C-ARMOR (DRAPES) ×2 IMPLANT
DRAPE HALF SHEET 40X57 (DRAPES) ×2 IMPLANT
DRAPE IMP U-DRAPE 54X76 (DRAPES) ×2 IMPLANT
DRAPE U-SHAPE 47X51 STRL (DRAPES) ×2 IMPLANT
DRAPE UTILITY XL STRL (DRAPES) ×4 IMPLANT
DRSG MEPILEX BORDER 4X4 (GAUZE/BANDAGES/DRESSINGS) ×12 IMPLANT
DURAPREP 26ML APPLICATOR (WOUND CARE) ×4 IMPLANT
ELECT CAUTERY BLADE 6.4 (BLADE) ×2 IMPLANT
ELECT REM PT RETURN 9FT ADLT (ELECTROSURGICAL) ×2
ELECTRODE REM PT RTRN 9FT ADLT (ELECTROSURGICAL) ×1 IMPLANT
GAUZE XEROFORM 1X8 LF (GAUZE/BANDAGES/DRESSINGS) ×4 IMPLANT
GLOVE BIOGEL PI IND STRL 7.0 (GLOVE) ×1 IMPLANT
GLOVE BIOGEL PI INDICATOR 7.0 (GLOVE) ×1
GLOVE ECLIPSE 7.0 STRL STRAW (GLOVE) ×2 IMPLANT
GLOVE SKINSENSE NS SZ7.5 (GLOVE) ×1
GLOVE SKINSENSE STRL SZ7.5 (GLOVE) ×1 IMPLANT
GOWN STRL REIN XL XLG (GOWN DISPOSABLE) ×2 IMPLANT
GUIDEROD T2 3X1000 (ROD) ×2 IMPLANT
GUIDEWIRE GAMMA (WIRE) ×4 IMPLANT
K-WIRE FIXATION 3X285 COATED (WIRE) ×4
KIT BASIN OR (CUSTOM PROCEDURE TRAY) ×2 IMPLANT
KWIRE FIXATION 3X285 COATED (WIRE) ×2 IMPLANT
MANIFOLD NEPTUNE II (INSTRUMENTS) ×2 IMPLANT
NAIL ELAS INSERT SLV SPI 8-11 (MISCELLANEOUS) ×2 IMPLANT
NAIL TIBIAL STND 10X330MM (Nail) ×2 IMPLANT
NAIL TIBIALSTD 10X330MM (Nail) ×1 IMPLANT
NS IRRIG 1000ML POUR BTL (IV SOLUTION) ×2 IMPLANT
PACK TOTAL JOINT (CUSTOM PROCEDURE TRAY) ×2 IMPLANT
PACK UNIVERSAL I (CUSTOM PROCEDURE TRAY) ×2 IMPLANT
PAD CAST 4YDX4 CTTN HI CHSV (CAST SUPPLIES) ×1 IMPLANT
PADDING CAST COTTON 4X4 STRL (CAST SUPPLIES) ×1
REAMER INTRAMEDULLARY 8MM 510 (MISCELLANEOUS) ×2 IMPLANT
SCREW LOCK FULL THREAD 05X57.5 (Screw) ×2 IMPLANT
SCREW LOCKING T2 F/T  5MMX35MM (Screw) ×1 IMPLANT
SCREW LOCKING T2 F/T  5X37.5MM (Screw) ×2 IMPLANT
SCREW LOCKING T2 F/T 5MMX35MM (Screw) ×1 IMPLANT
SCREW LOCKING T2 F/T 5X37.5MM (Screw) ×2 IMPLANT
SUT ETHILON 3 0 PS 1 (SUTURE) ×4 IMPLANT
SUT VIC AB 0 CT1 27 (SUTURE) ×2
SUT VIC AB 0 CT1 27XBRD ANTBC (SUTURE) ×2 IMPLANT
SUT VIC AB 1 CT1 27 (SUTURE) ×1
SUT VIC AB 1 CT1 27XBRD ANBCTR (SUTURE) ×1 IMPLANT
SUT VIC AB 2-0 CT1 27 (SUTURE) ×2
SUT VIC AB 2-0 CT1 TAPERPNT 27 (SUTURE) ×2 IMPLANT
TOWEL OR 17X26 10 PK STRL BLUE (TOWEL DISPOSABLE) ×2 IMPLANT
WATER STERILE IRR 1000ML POUR (IV SOLUTION) ×2 IMPLANT

## 2017-10-10 NOTE — Anesthesia Procedure Notes (Signed)
Procedure Name: Intubation Date/Time: 10/10/2017 7:46 AM Performed by: Marsa Aris, CRNA Pre-anesthesia Checklist: Patient identified, Emergency Drugs available, Suction available and Patient being monitored Patient Re-evaluated:Patient Re-evaluated prior to induction Oxygen Delivery Method: Circle System Utilized Preoxygenation: Pre-oxygenation with 100% oxygen Induction Type: IV induction Ventilation: Mask ventilation without difficulty Laryngoscope Size: Miller and 2 Grade View: Grade I Tube type: Oral Number of attempts: 1 Airway Equipment and Method: Stylet and Oral airway Placement Confirmation: ETT inserted through vocal cords under direct vision,  positive ETCO2 and breath sounds checked- equal and bilateral Secured at: 21 cm Tube secured with: Tape Dental Injury: Teeth and Oropharynx as per pre-operative assessment

## 2017-10-10 NOTE — Anesthesia Preprocedure Evaluation (Signed)
Anesthesia Evaluation  Patient identified by MRN, date of birth, ID band Patient awake    Reviewed: Allergy & Precautions, NPO status , Patient's Chart, lab work & pertinent test results  History of Anesthesia Complications Negative for: history of anesthetic complications  Airway Mallampati: II  TM Distance: >3 FB Neck ROM: Full    Dental  (+) Edentulous Upper, Edentulous Lower   Pulmonary neg pulmonary ROS,    breath sounds clear to auscultation       Cardiovascular hypertension, Pt. on medications (-) angina(-) Past MI and (-) CHF  Rhythm:Regular     Neuro/Psych PSYCHIATRIC DISORDERS Anxiety Depression negative neurological ROS     GI/Hepatic negative GI ROS, Neg liver ROS,   Endo/Other    Renal/GU negative Renal ROS     Musculoskeletal  (+) Arthritis , Left tibia fx   Abdominal   Peds  Hematology negative hematology ROS (+)   Anesthesia Other Findings   Reproductive/Obstetrics                            Anesthesia Physical Anesthesia Plan  ASA: II  Anesthesia Plan: General   Post-op Pain Management:    Induction: Intravenous  PONV Risk Score and Plan: 3 and Dexamethasone and Ondansetron  Airway Management Planned: Oral ETT  Additional Equipment: None  Intra-op Plan:   Post-operative Plan: Extubation in OR  Informed Consent: I have reviewed the patients History and Physical, chart, labs and discussed the procedure including the risks, benefits and alternatives for the proposed anesthesia with the patient or authorized representative who has indicated his/her understanding and acceptance.   Dental advisory given  Plan Discussed with: CRNA and Surgeon  Anesthesia Plan Comments:         Anesthesia Quick Evaluation

## 2017-10-10 NOTE — Progress Notes (Signed)
PROGRESS NOTE  [name] OAC:[id number] DOB: March 02, 1926 DOA: 10/09/2017 PCP: Baxter Hire, MD  HPI/Recap of past 24 hours:  Patient is seen after returned from [place] at bedside  Assessment/Plan: Principal Problem:   Closed displaced spiral fracture of shaft of left tibia Active Problems:   Tibia/fibula fracture   Closed fracture of shaft of left fibula with routine healing   Displaced left tibia fracture after mechanical fall: S/p left tibia closed reduction and intramedullary nailing and closed treatment of left fibular shaft fracture with manipulation on 10/6 by ortho Dr Eduard Roux NWB Wound care and DVT prophylaxis per ortho  Mild leukocytosis: Reactive? No fever Will get ua, monitor  Normocytic anemia: Likely underline anemia of chronic disease hgb at baseline around 12 monitor  HTN: Norvasc/propranolol  HLD: pravastatin  Code Status: Full   Family Communication: patient   Disposition Plan: pending PT eval, need ortho clearance,  She will need SNF placement, she lives by herself after her husband of 53yrs passed away a few years ago. She has no children. She was previously in dependent prior to the fall ( she slipped on wet grass)   Consultants:  orthopedics  Procedures:  S/p left tibia closed reduction and intramedullary nailing and closed treatment of left fibular shaft fracture with manipulation on 10/6 by ortho Dr Eduard Roux  Antibiotics:  Perioperative ancef   Objective: BP 137/62   Pulse 77   Temp 97.9 F (36.6 C)   Resp 15   SpO2 95%   Intake/Output Summary (Last 24 hours) at 10/10/2017 1401 Last data filed at 10/10/2017 1006 Gross per 24 hour  Intake 800 ml  Output 300 ml  Net 500 ml   There were no vitals filed for this visit.  Exam: Patient is examined daily including today on 10/10/2017, exams remain the same as of yesterday except that has changed    General:  NAD, thin  Cardiovascular:  RRR  Respiratory: CTABL  Abdomen: Soft/ND/NT, positive BS  Musculoskeletal: left ankle/lower leg post op changes  Neuro: alert, oriented   Data Reviewed: Basic Metabolic Panel: Recent Labs  Lab 10/06/17 1029 10/09/17 1342 10/10/17 1120  NA 135 139  --   K 4.6 3.9  --   CL 100 99  --   CO2 28 28  --   GLUCOSE 98 105*  --   BUN 13 13  --   CREATININE 0.79 0.76 0.78  CALCIUM 9.2 9.3  --    Liver Function Tests: Recent Labs  Lab 10/06/17 1029  AST 22  ALT 12  ALKPHOS 42  BILITOT 0.7  PROT 7.2  ALBUMIN 4.3   No results for input(s): LIPASE, AMYLASE in the last 168 hours. No results for input(s): AMMONIA in the last 168 hours. CBC: Recent Labs  Lab 10/06/17 1029 10/09/17 1342 10/10/17 1120  WBC 8.8 10.6* 11.9*  NEUTROABS 6.4  --   --   HGB 13.2 12.1 11.4*  HCT 37.3 35.5* 33.8*  MCV 100.7* 97.5 99.1  PLT 270 271 240   Cardiac Enzymes:   No results for input(s): CKTOTAL, CKMB, CKMBINDEX, TROPONINI in the last 168 hours. BNP (last 3 results) No results for input(s): BNP in the last 8760 hours.  ProBNP (last 3 results) No results for input(s): PROBNP in the last 8760 hours.  CBG: No results for input(s): GLUCAP in the last 168 hours.  No results found for this or any previous visit (from the past 240 hour(s)).  Studies: Dg Tibia/fibula Left  Result Date: 10/10/2017 CLINICAL DATA:  84 year old female undergoing left tibia ORIF for comminuted spiral fracture of the distal diaphysis. Preexisting left ankle bimalleolar ORIF. EXAM: DG C-ARM 61-120 MIN; LEFT TIBIA AND FIBULA - 2 VIEW COMPARISON:  10/09/2017. FINDINGS: Five intraoperative fluoroscopic spot views of the left tibia. An intramedullary rod has been placed with 2 interlocking proximal and 1 interlocking distal cortical screws. Superimposed chronic distal left fibula and medial malleolus hardware. Near anatomic alignment now about the spiral distal left tibia shaft fracture. Likewise, near anatomic  alignment at the visible proximal left fibula shaft spiral fracture. IMPRESSION: ORIF left tibia shaft with no adverse features. FLUOROSCOPY TIME:  2 minutes 32 seconds Electronically Signed   By: Genevie Ann M.D.   On: 10/10/2017 09:16   Dg Chest Port 1 View  Result Date: 10/09/2017 CLINICAL DATA:  84 year old female with a history of leg fracture EXAM: PORTABLE CHEST 1 VIEW COMPARISON:  04/09/2010 FINDINGS: Cardiomediastinal silhouette unchanged in size and contour. No pneumothorax. No pleural effusion. No confluent airspace disease. Similar appearance of chronic interstitial opacities. No displaced fracture. IMPRESSION: Chronic lung changes without evidence of acute cardiopulmonary disease. Electronically Signed   By: Corrie Mckusick D.O.   On: 10/09/2017 14:25   Dg C-arm 1-60 Min  Result Date: 10/10/2017 CLINICAL DATA:  84 year old female undergoing left tibia ORIF for comminuted spiral fracture of the distal diaphysis. Preexisting left ankle bimalleolar ORIF. EXAM: DG C-ARM 61-120 MIN; LEFT TIBIA AND FIBULA - 2 VIEW COMPARISON:  10/09/2017. FINDINGS: Five intraoperative fluoroscopic spot views of the left tibia. An intramedullary rod has been placed with 2 interlocking proximal and 1 interlocking distal cortical screws. Superimposed chronic distal left fibula and medial malleolus hardware. Near anatomic alignment now about the spiral distal left tibia shaft fracture. Likewise, near anatomic alignment at the visible proximal left fibula shaft spiral fracture. IMPRESSION: ORIF left tibia shaft with no adverse features. FLUOROSCOPY TIME:  2 minutes 32 seconds Electronically Signed   By: Genevie Ann M.D.   On: 10/10/2017 09:16    Scheduled Meds: . acetaminophen  500 mg Oral Q6H  . amLODipine  10 mg Oral Daily  . docusate sodium  200 mg Oral BID  . [START ON 10/11/2017] enoxaparin (LOVENOX) injection  40 mg Subcutaneous Q24H  . propranolol ER  60 mg Oral QHS    Continuous Infusions: . sodium chloride    .   ceFAZolin (ANCEF) IV    . methocarbamol (ROBAXIN) IV       Time spent: 67mins I have personally reviewed and interpreted on  10/10/2017 daily labs,  imagings as discussed above under date review session and assessment and plans.  I reviewed all nursing notes, pharmacy notes, consultant notes,  vitals, pertinent old records  I have discussed plan of care as described above with RN , patient and friends on 10/10/2017   Florencia Reasons MD, PhD  [facility] Pager [phone]. If 7PM-7AM, please contact night-coverage at [email or web address], password [facility] 10/10/2017, 2:01 PM  LOS: 1 day

## 2017-10-10 NOTE — Progress Notes (Signed)
Orthopedic Tech Progress Note Patient Details:  [name] March 14, 1926 [id number]  Ortho Devices Type of Ortho Device: CAM walker Ortho Device/Splint Interventions: Application   Post Interventions Patient Tolerated: Well Instructions Provided: Care of device   [place] Pink 10/10/2017, 8:51 AM

## 2017-10-10 NOTE — Plan of Care (Signed)

## 2017-10-10 NOTE — Discharge Instructions (Signed)
° ° °  1. Change dressings as needed °2. May shower but keep incisions covered and dry °3. Take lovenox to prevent blood clots °4. Take stool softeners as needed °5. Take pain meds as needed ° °

## 2017-10-10 NOTE — Transfer of Care (Signed)
Immediate Anesthesia Transfer of Care Note  Patient: [name]  Procedure(s) Performed: INTRAMEDULLARY (IM) NAIL TIBIAL (Left Leg Lower)  Patient Location: PACU  Anesthesia Type:General  Level of Consciousness: awake, alert  and oriented  Airway & Oxygen Therapy: Patient Spontanous Breathing  Post-op Assessment: Report given to RN and Post -op Vital signs reviewed and stable  Post vital signs: Reviewed and stable  Last Vitals:  Vitals Value Taken Time  BP 162/71 10/10/2017  9:27 AM  Temp    Pulse 85 10/10/2017  9:30 AM  Resp 13 10/10/2017  9:30 AM  SpO2 91 % 10/10/2017  9:30 AM  Vitals shown include unvalidated device data.  Last Pain:  Vitals:   10/10/17 0600  TempSrc:   PainSc: 5       Patients Stated Pain Goal: 0 (22/77/37 5051)  Complications: No apparent anesthesia complications

## 2017-10-10 NOTE — Op Note (Signed)
Date of Surgery: 10/10/2017  INDICATIONS: [name] is a 84 y.o.-year-old female who sustained a displaced left tibia fracture. The risks and benefits of the procedure discussed with the patient prior to the procedure and all questions were answered; consent was obtained.  PREOPERATIVE DIAGNOSIS:  left tibia and fibula fracture  POSTOPERATIVE DIAGNOSIS: Same  PROCEDURE:   1. left tibia closed reduction and intramedullary nailing CPT: 65993  2. closed treatment of left fibular shaft fracture with manipulation, CPT - 57017  SURGEON: N. Eduard Roux, M.D.  ASSIST: Ciro Backer [place], [place]; necessary for the timely completion of procedure and due to complexity of procedure.  ANESTHESIA:  general  IV FLUIDS AND URINE: See anesthesia record.  ESTIMATED BLOOD LOSS: minimal mL.  IMPLANTS: Stryker 10 x 330 tibial nail   DRAINS: None.  COMPLICATIONS: None.  DESCRIPTION OF PROCEDURE: The patient was brought to the operating room and placed supine on the operating table.  The patient's leg had been signed prior to the procedure.  The patient had the anesthesia placed by the anesthesiologist.  The prep verification and incision time-outs were performed to confirm that this was the correct patient, site, side and location. The patient had an SCD on the opposite lower extremity. The patient did receive antibiotics prior to the incision and was re-dosed during the procedure as needed at indicated intervals.  The patient had the lower extremity prepped and draped in the standard surgical fashion.  The incision was first made over the quadriceps tendon in the midline and taken down to the skin and subcutaneous tissue to expose the peritenon. The peritenon was incised in line with the skin incision and then a poke hole was made in the quadriceps tendon in the midline. A knife was then used to longitudinally divide the tendon in line with its fibers, taking care not to cross over any fibers. The  guide wire was placed at the proximal, anterior tibia, confirming its location on both AP and lateral views. The wire was drilled into the bone and then the opening reamer was placed over this and maneuvered so that the reamer was parallel with anterior cortex of the tibia. The ball-tipped guide wire was then placed down into the canal towards the fracture site. The fracture was reduced and the wire was passed and confirmed to be in the proper location on both AP and lateral views.  The measuring stick was used to measure the length of the nail.  Sequential reaming was then performed, then the nail was gently hammered into place over the guide wire and the guide wire was removed. The proximal screws were placed through the interlocking drill guide using the sleeve. The distal screws were placed using the perfect circles technique. All screws were placed in the standard fashion, first incising the skin and then spreading with a tonsil, then drilling, measuring with a depth gauge, and then placing the screws by hand. The final x-rays were taken in both AP and lateral views to confirm the fracture reduction as well as the placement of all hardware. The fibula fracture was treated in a closed manner with manipulation to realign the fracture.  The wounds were copiously irrigated with saline and then the peritenon was closed with 0 Vicryl figure-of-eight interrupted sutures. 2.0 vicryl was used to close the subcutaneous layer.  3.0 nylon was then used to close all of the open incision wounds.  The wounds were cleaned and dried a final time and a sterile dressing was placed.  The patient was then placed in a CAM boot in neutral ankle dorsiflexion. The patient's calf was soft to palpation at the end of the case.  The patient was then transferred to a bed and taken to the recovery room in stable condition.  All counts were correct at the end of the case.  POSTOPERATIVE PLAN: [name] will be NWB and will return 2 weeks  for suture removal.  [name] will receive DVT prophylaxis.  Azucena Cecil, MD [place] 9:06 AM

## 2017-10-11 ENCOUNTER — Encounter (HOSPITAL_COMMUNITY): Payer: Self-pay | Admitting: General Practice

## 2017-10-11 LAB — CBC
HCT: 31.9 % — ABNORMAL LOW (ref 36.0–46.0)
Hemoglobin: 10.6 g/dL — ABNORMAL LOW (ref 12.0–15.0)
MCH: 33.2 pg (ref 26.0–34.0)
MCHC: 33.2 g/dL (ref 30.0–36.0)
MCV: 100 fL (ref 78.0–100.0)
Platelets: 246 10*3/uL (ref 150–400)
RBC: 3.19 MIL/uL — ABNORMAL LOW (ref 3.87–5.11)
RDW: 11.9 % (ref 11.5–15.5)
WBC: 12.9 10*3/uL — ABNORMAL HIGH (ref 4.0–10.5)

## 2017-10-11 LAB — BASIC METABOLIC PANEL
Anion gap: 6 (ref 5–15)
BUN: 12 mg/dL (ref 8–23)
CO2: 26 mmol/L (ref 22–32)
Calcium: 8.7 mg/dL — ABNORMAL LOW (ref 8.9–10.3)
Chloride: 104 mmol/L (ref 98–111)
Creatinine, Ser: 0.69 mg/dL (ref 0.44–1.00)
GFR calc Af Amer: >60 mL/min (ref >60)
GFR calc non Af Amer: >60 mL/min (ref >60)
Glucose, Bld: 110 mg/dL — ABNORMAL HIGH (ref 70–99)
Potassium: 4.3 mmol/L (ref 3.5–5.1)
Sodium: 136 mmol/L (ref 135–145)

## 2017-10-11 LAB — URINALYSIS, ROUTINE W REFLEX MICROSCOPIC
Bilirubin Urine: NEGATIVE
Glucose, UA: NEGATIVE mg/dL
Hgb urine dipstick: NEGATIVE
Ketones, ur: NEGATIVE mg/dL
Leukocytes, UA: NEGATIVE
Nitrite: NEGATIVE
Protein, ur: NEGATIVE mg/dL
Specific Gravity, Urine: 1.006 (ref 1.005–1.030)
pH: 7 (ref 5.0–8.0)

## 2017-10-11 MED ORDER — CLONAZEPAM 0.5 MG PO TABS: 0.2500 mg | ORAL_TABLET | Freq: Three times a day (TID) | ORAL | Status: DC | PRN

## 2017-10-11 MED ORDER — SENNOSIDES-DOCUSATE SODIUM 8.6-50 MG PO TABS: 1.0000 | ORAL_TABLET | Freq: Every day | ORAL | 0 refills | Status: DC

## 2017-10-11 MED ORDER — CLONAZEPAM 0.25 MG PO TBDP
0.2500 mg | ORAL_TABLET | Freq: Three times a day (TID) | ORAL | Status: DC | PRN
  Administered 2017-10-11: 0.25 mg via ORAL
  Filled 2017-10-11: qty 1

## 2017-10-11 MED ORDER — MINERAL OIL RE ENEM
1.0000 | ENEMA | Freq: Once | RECTAL | Status: DC
  Filled 2017-10-11: qty 1

## 2017-10-11 MED ORDER — BISACODYL 10 MG RE SUPP
10.0000 mg | Freq: Every day | RECTAL | Status: DC
  Administered 2017-10-11: 10 mg via RECTAL
  Filled 2017-10-11: qty 1

## 2017-10-11 MED ORDER — ASPIRIN EC 81 MG PO TBEC: 81.0000 mg | DELAYED_RELEASE_TABLET | Freq: Two times a day (BID) | ORAL | 2 refills | Status: DC

## 2017-10-11 MED ORDER — SODIUM CHLORIDE 0.9 % IV SOLN
INTRAVENOUS | Status: DC
  Administered 2017-10-11: 12:00:00 via INTRAVENOUS

## 2017-10-11 NOTE — Care Management Note (Signed)
Case Management Note  Patient Details  Name: [name] MRN: [id number] Date of Birth: 1926-12-03  Subjective/Objective:   Left ankle fracture,   Left IM Nail tibia               Action/Plan: NCM spoke to pt and friend at bedside. Offered choice for HH/list provided. Pt agreeable to [facility] for HH. Pt has hospital bed, wheelchair, RW and bedside commode at home. States she has a Animal nutritionist that will be available to assist her 24/7 and friends.    Expected Discharge Date:  10/11/17               Expected Discharge Plan:  [place]  In-House Referral:  NA  Discharge planning Services  CM Consult  Post Acute Care Choice:  Home Health Choice offered to:  Patient  DME Arranged:  N/A DME Agency:  NA  HH Arranged:  RN, PT, OT, Nurse's Aide, Social Work [employer] Agency:  [place]  Status of Service:  Completed, signed off  If discussed at [employer], dates discussed:    Additional Comments:  Erenest Rasher, RN 10/11/2017, 12:08 PM

## 2017-10-11 NOTE — Discharge Summary (Signed)
Discharge Summary  [name] NWG:[id number] DOB: Jul 12, 1926  PCP: Baxter Hire, MD  Admit date: 10/09/2017 Discharge date: 10/11/2017  Time spent: 2mins, patient declined SNF, she is adamant to go home.   Recommendations for Outpatient Follow-up:  1. F/u with PMD within a week  for hospital discharge follow up, repeat cbc/bmp at follow up 2. F/u with orthopedic Dr Eduard Roux in two weeks 3. Home health arranged  Discharge Diagnoses:  Active Hospital Problems   Diagnosis Date Noted  . Closed displaced spiral fracture of shaft of left tibia 10/09/2017  . Closed fracture of shaft of left fibula with routine healing   . Tibia/fibula fracture 10/09/2017    Resolved Hospital Problems  No resolved problems to display.    Discharge Condition: stable  Diet recommendation: heart healthy   History of present illness:  [name] VHQ:[id number] DOB: Apr 25, 1926  DOA: 10/09/2017 PCP: Baxter Hire, MD  Patient coming from: Home  Chief Complaint: Left leg pain  HPI: [name] is a 84 y.o. female with medical history significant of hypertension, presented to the emergency department complaining of left leg pain.  Patient report that she sustained a fall 3 days ago after she slipped going down hill.  She visited the ED at [facility] x-ray was obtained and show spiral fracture of the tibia however she was discharged home with follow-up to orthopedics.  Patient's orthopedist on 10/3 who placed on splint advised no weight bearing on her left leg, however patient was not able to tolerate pain therefore came to [facility] emergency department for further evaluation.  Patient taking pain medication with minimal response.  Denies chest pain, shortness of breath and palpitation.  Help blood pressure has been slight higher in the past few days.  No other complaints at this time.  ED Course:.  Repeated left leg x-ray showed tib-fib fracture, orthopedic was consulted  recommending surgical treatment.  Labs otherwise unremarkable.  Hospital Course:  Principal Problem:   Closed displaced spiral fracture of shaft of left tibia Active Problems:   Tibia/fibula fracture   Closed fracture of shaft of left fibula with routine healing   Displacedlefttibia fracture after mechanical fall: -S/p lefttibia closed reduction and intramedullary nailing and closed treatment of left fibular shaft fracture with manipulation on 10/6 by ortho Dr Eduard Roux -NWB left lower extremity -patient does not want lovenox injection, ortho recommended asa 81mg  bid  -she is to follow up with orthopedic in two weeks  Mild leukocytosis: Reactive? No fever ua unremarkable, Appear dehydrated, she received gentle hydration Encourage oral intake F/u with pcp  Normocytic anemia: Likely underline anemia of chronic disease F/u with pcp   HTN: Norvasc/propranolol  HLD: Pravastatin  Constipation: Stool softener  Code Status: DNR, confirmed with patient and family  Family Communication: patient and HPOA Ms Blanca Friend over the phone  Disposition Plan: patient declined SNF, home with home health   Consultants:  orthopedics  Procedures:  S/p lefttibia closed reduction and intramedullary nailing and closed treatment of left fibular shaft fracture with manipulation on 10/6 by ortho Dr Eduard Roux  Antibiotics:  Perioperative ancef   Discharge Exam: BP (!) 156/69 (BP Location: Right Arm)   Pulse 97   Temp 98.5 F (36.9 C) (Oral)   Resp 16   SpO2 97%   General: NAD, aaox3 Cardiovascular: RRR Respiratory: CTABL Extremity: left ankle /leg post op changes  Discharge Instructions You were cared for by a hospitalist during your hospital stay. If you  have any questions about your discharge medications or the care you received while you were in the hospital after you are discharged, you can call the unit and asked to speak with the hospitalist on call if  the hospitalist that took care of you is not available. Once you are discharged, your primary care physician will handle any further medical issues. Please note that NO REFILLS for any discharge medications will be authorized once you are discharged, as it is imperative that you return to your primary care physician (or establish a relationship with a primary care physician if you do not have one) for your aftercare needs so that they can reassess your need for medications and monitor your lab values.  Discharge Instructions    Diet - low sodium heart healthy   Complete by:  As directed    Increase activity slowly   Complete by:  As directed    NWB LLE Cam walker to LLE at all times   Non weight bearing   Complete by:  As directed      Allergies as of 10/11/2017   No Known Allergies     Medication List    STOP taking these medications   oxyCODONE-acetaminophen 5-325 MG tablet Commonly known as:  PERCOCET/ROXICET   traMADol 50 MG tablet Commonly known as:  ULTRAM     TAKE these medications   amLODipine 10 MG tablet Commonly known as:  NORVASC Take 10 mg by mouth daily. What changed:  Another medication with the same name was removed. Continue taking this medication, and follow the directions you see here.   aspirin EC 81 MG tablet Take 1 tablet (81 mg total) by mouth 2 (two) times daily. What changed:  when to take this   diazepam 5 MG tablet Commonly known as:  VALIUM Take 1.5 tablets (7.5 mg total) by mouth at bedtime as needed (insomnia). for anxiety   docusate sodium 100 MG capsule Commonly known as:  COLACE Take 1 capsule (100 mg total) by mouth 2 (two) times daily. What changed:  how much to take   Garlic Oil 2 MG Caps Take 1 capsule by mouth daily.   GERITOL PO Take 1 tablet by mouth daily.   HYDROcodone-acetaminophen 7.5-325 MG tablet Commonly known as:  NORCO Take 1-2 tablets by mouth every 6 (six) hours as needed for moderate pain.   hydrOXYzine 25 MG  tablet Commonly known as:  ATARAX/VISTARIL TAKE ONE CAPSULE BY MOUTH THREE TIMES DAILY AS NEEDED   lactulose 10 GM/15ML solution Commonly known as:  CHRONULAC TAKE 30MLS(0NE OUNCE) BY MOUTH 2 TIMES DAILY AS NEEDED FOR MILD CONSTIPATION. DO NOT USE MORE THAN ONCE A WEEK   Lutein 20 MG Caps Take 20 mg by mouth daily.   pravastatin 20 MG tablet Commonly known as:  PRAVACHOL Take 1 tablet (20 mg total) by mouth daily.   propranolol ER 60 MG 24 hr capsule Commonly known as:  INDERAL LA TAKE ONE CAPSULE BY MOUTH AT BEDTIME What changed:  when to take this   senna-docusate 8.6-50 MG tablet Commonly known as:  Senokot-S Take 1 tablet by mouth at bedtime.   VITAMIN E PO Take 400 Units by mouth daily.            Durable Medical Equipment  (From admission, onward)         Start     Ordered   10/10/17 1057  DME Walker rolling  Once    Question:  Patient needs a walker  to treat with the following condition  Answer:  History of open reduction and internal fixation (ORIF) procedure   10/10/17 1056   10/10/17 1057  DME 3 n 1  Once     10/10/17 1056   10/10/17 1057  DME Bedside commode  Once    Question:  Patient needs a bedside commode to treat with the following condition  Answer:  History of open reduction and internal fixation (ORIF) procedure   10/10/17 1056           Discharge Care Instructions  (From admission, onward)         Start     Ordered   10/10/17 0000  Non weight bearing     10/10/17 0911         No Known Allergies Follow-up Information    Leandrew Koyanagi, MD In 2 weeks.   Specialty:  Orthopedic Surgery Why:  For suture removal, For wound re-check Contact information: [place] [address] [phone]        Baxter Hire, MD Follow up in 1 week(s).   Specialty:  Internal Medicine Contact information: [place] [address] [place], [facility] Follow  up.   Specialty:  [facility] Why:  Home Health RN, Physical Therapy, Occupational Therapy, aide and Social Worker-agency will call to arrange initial visit Contact information: [address] [place] [address] [phone]            The results of significant diagnostics from this hospitalization (including imaging, microbiology, ancillary and laboratory) are listed below for reference.    Significant Diagnostic Studies: Dg Tibia/fibula Left  Result Date: 10/10/2017 CLINICAL DATA:  84 year old female undergoing left tibia ORIF for comminuted spiral fracture of the distal diaphysis. Preexisting left ankle bimalleolar ORIF. EXAM: DG C-ARM 61-120 MIN; LEFT TIBIA AND FIBULA - 2 VIEW COMPARISON:  10/09/2017. FINDINGS: Five intraoperative fluoroscopic spot views of the left tibia. An intramedullary rod has been placed with 2 interlocking proximal and 1 interlocking distal cortical screws. Superimposed chronic distal left fibula and medial malleolus hardware. Near anatomic alignment now about the spiral distal left tibia shaft fracture. Likewise, near anatomic alignment at the visible proximal left fibula shaft spiral fracture. IMPRESSION: ORIF left tibia shaft with no adverse features. FLUOROSCOPY TIME:  2 minutes 32 seconds Electronically Signed   By: Genevie Ann M.D.   On: 10/10/2017 09:16   Dg Tibia/fibula Left  Result Date: 10/09/2017 CLINICAL DATA:  Known fracture.  Worsening pain. EXAM: LEFT TIBIA AND FIBULA - 2 VIEW COMPARISON:  10/06/2017 FINDINGS: Displaced comminuted fracture noted through the distal tibial metaphysis and proximal to mid fibular shaft. These are essentially unchanged since prior study. Plate and screw fixation device noted in the distal fibula with screws in the medial malleolus from remote injury. IMPRESSION: Displaced, comminuted distal tibial metaphyseal and fibular shaft fractures, stable. Electronically Signed   By: Rolm Baptise M.D.   On: 10/09/2017  12:57   Dg Tibia/fibula Left  Result Date: 10/06/2017 CLINICAL DATA:  Fall.  Leg pain. EXAM: LEFT TIBIA AND FIBULA - 2 VIEW COMPARISON:  04/05/2017 FINDINGS: Acute fracture proximal fibula with mild displacement Spiral fracture distal tibia with 1 cm of lateral displacement. Chronic healed bimalleolar fracture with screw and plate fixation. IMPRESSION: Acute fractures of the tibia and fibula Chronic healed fracture of the medial and lateral malleolus. Electronically Signed   By: Franchot Gallo  M.D.   On: 10/06/2017 11:03   Dg Ankle Complete Left  Result Date: 10/06/2017 CLINICAL DATA:  Fall.  Leg pain EXAM: LEFT ANKLE COMPLETE - 3+ VIEW COMPARISON:  04/05/2017 FINDINGS: Acute spiral fracture distal tibia with 1 cm lateral displacement Chronic healed fractures of the mediolateral malleolus with surgical fixation. Ankle mortise intact. IMPRESSION: Acute spiral fracture distal tibia. Electronically Signed   By: Franchot Gallo M.D.   On: 10/06/2017 11:05   Dg Chest Port 1 View  Result Date: 10/09/2017 CLINICAL DATA:  84 year old female with a history of leg fracture EXAM: PORTABLE CHEST 1 VIEW COMPARISON:  04/09/2010 FINDINGS: Cardiomediastinal silhouette unchanged in size and contour. No pneumothorax. No pleural effusion. No confluent airspace disease. Similar appearance of chronic interstitial opacities. No displaced fracture. IMPRESSION: Chronic lung changes without evidence of acute cardiopulmonary disease. Electronically Signed   By: Corrie Mckusick D.O.   On: 10/09/2017 14:25   Dg C-arm 1-60 Min  Result Date: 10/10/2017 CLINICAL DATA:  84 year old female undergoing left tibia ORIF for comminuted spiral fracture of the distal diaphysis. Preexisting left ankle bimalleolar ORIF. EXAM: DG C-ARM 61-120 MIN; LEFT TIBIA AND FIBULA - 2 VIEW COMPARISON:  10/09/2017. FINDINGS: Five intraoperative fluoroscopic spot views of the left tibia. An intramedullary rod has been placed with 2 interlocking proximal and 1  interlocking distal cortical screws. Superimposed chronic distal left fibula and medial malleolus hardware. Near anatomic alignment now about the spiral distal left tibia shaft fracture. Likewise, near anatomic alignment at the visible proximal left fibula shaft spiral fracture. IMPRESSION: ORIF left tibia shaft with no adverse features. FLUOROSCOPY TIME:  2 minutes 32 seconds Electronically Signed   By: Genevie Ann M.D.   On: 10/10/2017 09:16    Microbiology: No results found for this or any previous visit (from the past 240 hour(s)).   Labs: Basic Metabolic Panel: Recent Labs  Lab 10/06/17 1029 10/09/17 1342 10/10/17 1120 10/11/17 0631  NA 135 139  --  136  K 4.6 3.9  --  4.3  CL 100 99  --  104  CO2 28 28  --  26  GLUCOSE 98 105*  --  110*  BUN 13 13  --  12  CREATININE 0.79 0.76 0.78 0.69  CALCIUM 9.2 9.3  --  8.7*   Liver Function Tests: Recent Labs  Lab 10/06/17 1029  AST 22  ALT 12  ALKPHOS 42  BILITOT 0.7  PROT 7.2  ALBUMIN 4.3   No results for input(s): LIPASE, AMYLASE in the last 168 hours. No results for input(s): AMMONIA in the last 168 hours. CBC: Recent Labs  Lab 10/06/17 1029 10/09/17 1342 10/10/17 1120 10/11/17 0631  WBC 8.8 10.6* 11.9* 12.9*  NEUTROABS 6.4  --   --   --   HGB 13.2 12.1 11.4* 10.6*  HCT 37.3 35.5* 33.8* 31.9*  MCV 100.7* 97.5 99.1 100.0  PLT 270 271 240 246   Cardiac Enzymes: No results for input(s): CKTOTAL, CKMB, CKMBINDEX, TROPONINI in the last 168 hours. BNP: BNP (last 3 results) No results for input(s): BNP in the last 8760 hours.  ProBNP (last 3 results) No results for input(s): PROBNP in the last 8760 hours.  CBG: No results for input(s): GLUCAP in the last 168 hours.     Signed:  Florencia Reasons MD, PhD  [facility] 10/11/2017, 12:39 PM

## 2017-10-11 NOTE — Progress Notes (Signed)
PROGRESS NOTE  [name] QZE:[id number] DOB: 10/08/1926 DOA: 10/09/2017 PCP: Baxter Hire, MD  HPI/Recap of past 24 hours:  POD#1 She is upset this am, she wants to go home, friend at bedside   Assessment/Plan: Principal Problem:   Closed displaced spiral fracture of shaft of left tibia Active Problems:   Tibia/fibula fracture   Closed fracture of shaft of left fibula with routine healing   Displaced left tibia fracture after mechanical fall: -S/p left tibia closed reduction and intramedullary nailing and closed treatment of left fibular shaft fracture with manipulation on 10/6 by ortho Dr Eduard Roux -NWB -Wound care and DVT prophylaxis per ortho  Mild leukocytosis: Reactive? No fever ua unremarkable, Appear dehydrated, gentle hydration, repeat cbc in am  monitor  Normocytic anemia: Likely underline anemia of chronic disease hgb at baseline around 12 monitor  HTN: Norvasc/propranolol  HLD: pravastatin  Code Status: DNR, confirmed with patient and family  Family Communication: patient   Disposition Plan: pending PT eval, need ortho clearance,  She will need SNF placement, she lives by herself after her husband of 3yrs passed away a few years ago. She has no children. She was previously in dependent prior to the fall ( she slipped on wet grass)   Consultants:  orthopedics  Procedures:  S/p left tibia closed reduction and intramedullary nailing and closed treatment of left fibular shaft fracture with manipulation on 10/6 by ortho Dr Eduard Roux  Antibiotics:  Perioperative ancef   Objective: BP (!) 156/69 (BP Location: Right Arm)   Pulse 97   Temp 98.5 F (36.9 C) (Oral)   Resp 16   SpO2 97%   Intake/Output Summary (Last 24 hours) at 10/11/2017 0959 Last data filed at 10/11/2017 0500 Gross per 24 hour  Intake 600 ml  Output 1700 ml  Net -1100 ml   There were no vitals filed for this visit.  Exam: Patient is examined daily  including today on 10/11/2017, exams remain the same as of yesterday except that has changed    General:  NAD, thin  Cardiovascular: RRR  Respiratory: CTABL  Abdomen: Soft/ND/NT, positive BS  Musculoskeletal: left ankle/lower leg post op changes  Neuro: alert, oriented   Data Reviewed: Basic Metabolic Panel: Recent Labs  Lab 10/06/17 1029 10/09/17 1342 10/10/17 1120 10/11/17 0631  NA 135 139  --  136  K 4.6 3.9  --  4.3  CL 100 99  --  104  CO2 28 28  --  26  GLUCOSE 98 105*  --  110*  BUN 13 13  --  12  CREATININE 0.79 0.76 0.78 0.69  CALCIUM 9.2 9.3  --  8.7*   Liver Function Tests: Recent Labs  Lab 10/06/17 1029  AST 22  ALT 12  ALKPHOS 42  BILITOT 0.7  PROT 7.2  ALBUMIN 4.3   No results for input(s): LIPASE, AMYLASE in the last 168 hours. No results for input(s): AMMONIA in the last 168 hours. CBC: Recent Labs  Lab 10/06/17 1029 10/09/17 1342 10/10/17 1120 10/11/17 0631  WBC 8.8 10.6* 11.9* 12.9*  NEUTROABS 6.4  --   --   --   HGB 13.2 12.1 11.4* 10.6*  HCT 37.3 35.5* 33.8* 31.9*  MCV 100.7* 97.5 99.1 100.0  PLT 270 271 240 246   Cardiac Enzymes:   No results for input(s): CKTOTAL, CKMB, CKMBINDEX, TROPONINI in the last 168 hours. BNP (last 3 results) No results for input(s): BNP in the last 8760 hours.  ProBNP (last 3 results) No results for input(s): PROBNP in the last 8760 hours.  CBG: No results for input(s): GLUCAP in the last 168 hours.  No results found for this or any previous visit (from the past 240 hour(s)).   Studies: No results found.  Scheduled Meds: . acetaminophen  500 mg Oral Q6H  . amLODipine  10 mg Oral Daily  . bisacodyl  10 mg Rectal Daily  . enoxaparin (LOVENOX) injection  40 mg Subcutaneous Q24H  . mineral oil  1 enema Rectal Once  . propranolol ER  60 mg Oral QHS  . senna-docusate  1 tablet Oral BID    Continuous Infusions: . sodium chloride 75 mL/hr at 10/11/17 0820  . methocarbamol (ROBAXIN) IV        Time spent: 66mins, case discussed with ortho, family and social worker I have personally reviewed and interpreted on  10/11/2017 daily labs,  imagings as discussed above under date review session and assessment and plans.  I reviewed all nursing notes, pharmacy notes, consultant notes,  vitals, pertinent old records  I have discussed plan of care as described above with RN , patient and friends on 10/11/2017   Florencia Reasons MD, PhD  [facility] Pager [phone]. If 7PM-7AM, please contact night-coverage at [email or web address], password [facility] 10/11/2017, 9:59 AM  LOS: 2 days

## 2017-10-11 NOTE — Progress Notes (Signed)
Patient and friend instructed upon AVS at this time.  Follow up appointments and medications instructed upon.  No s/s of distress noted denies pain.  All belongings sent with patient at this time.  Patient taken down to car by CNA.

## 2017-10-11 NOTE — Anesthesia Postprocedure Evaluation (Signed)
Anesthesia Post Note  Patient: [name]  Procedure(s) Performed: INTRAMEDULLARY (IM) NAIL TIBIAL (Left Leg Lower)     Patient location during evaluation: PACU Anesthesia Type: General Level of consciousness: awake and alert Pain management: pain level controlled Vital Signs Assessment: post-procedure vital signs reviewed and stable Respiratory status: spontaneous breathing, nonlabored ventilation, respiratory function stable and patient connected to nasal cannula oxygen Cardiovascular status: blood pressure returned to baseline and stable Postop Assessment: no apparent nausea or vomiting Anesthetic complications: no    Last Vitals:  Vitals:   10/11/17 0127 10/11/17 0454  BP: 115/80 (!) 156/69  Pulse: 79 97  Resp:  16  Temp: 36.9 C 36.9 C  SpO2: 95% 97%    Last Pain:  Vitals:   10/11/17 0454  TempSrc: Oral  PainSc:                  [name]

## 2017-10-11 NOTE — Progress Notes (Signed)
RNCM notified CSW of patient's plan for home health services and to be followed by social work once home to identity additional needs.   CSW signing off.   [name], [name]

## 2017-10-11 NOTE — Progress Notes (Signed)
Physical Therapy Evaluation Patient Details Name: [name] MRN: [id number] DOB: 1926-07-21 Today's Date: 10/11/2017   History of Present Illness  Pt is an 84 y/o female who presents s/p fall at home, sustaining a L tib/fib fracture. She is now s/p IM nailing and is NWB on the LLE in CAM boot. PMH significant for R wrist surgery, cataract extraction (bilateral), L ankle surgery, HTN.   Clinical Impression  Pt admitted with above diagnosis. Pt currently with functional limitations due to the deficits listed below (see PT Problem List). At the time of PT eval pt was able to perform transfers and ambulation with gross min assist (+2 for chair follow). Endurance is limited due to the nature of her injury and subsequent NWB status. She does have a transfer chair she can use at home, however. Overall pt adamant about returning home today, and gave varying details of how much assistance would be available, as well as what DME she would need upon d/c. If she will indeed have 24 hour support at home, feel she is safe for d/c with home health services to follow up. Acutely, pt will benefit from skilled PT to increase their independence and safety with mobility to allow discharge to the venue listed below.       Follow Up Recommendations Home health PT;Supervision/Assistance - 24 hour    Equipment Recommendations  3in1 (PT)    Recommendations for Other Services       Precautions / Restrictions Precautions Precautions: Fall Required Braces or Orthoses: Other Brace/Splint Other Brace/Splint: CAM boot on L foot Restrictions Weight Bearing Restrictions: Yes LLE Weight Bearing: Non weight bearing      Mobility  Bed Mobility Overal bed mobility: Needs Assistance Bed Mobility: Supine to Sit     Supine to sit: Min assist     General bed mobility comments: HOB elevated. Pt required assistance to advance LLE off EOB.   Transfers Overall transfer level: Needs assistance Equipment used:  Rolling walker (2 wheeled) Transfers: Sit to/from Stand Sit to Stand: Min assist         General transfer comment: VC's for hand placement on seated surface for safety. Pt demonstrated the ability to power-up to full stand with min assist for balance support and safety.   Ambulation/Gait Ambulation/Gait assistance: Min assist Gait Distance (Feet): 20 Feet Assistive device: Rolling walker (2 wheeled) Gait Pattern/deviations: Decreased stride length;Trunk flexed;Step-to pattern Gait velocity: Decreased Gait velocity interpretation: <1.31 ft/sec, indicative of household ambulator General Gait Details: VC's for sequencing and general safety with RW for support. Pt maintained WB status well with minimal attempts to rest LLE on floor.   Stairs            Wheelchair Mobility    Modified Rankin (Stroke Patients Only)       Balance Overall balance assessment: Needs assistance Sitting-balance support: Feet supported;No upper extremity supported Sitting balance-Leahy Scale: Fair     Standing balance support: Bilateral upper extremity supported;During functional activity Standing balance-Leahy Scale: Good                               Pertinent Vitals/Pain Pain Assessment: Faces Faces Pain Scale: Hurts a little bit Pain Location: L leg Pain Descriptors / Indicators: Operative site guarding;Discomfort Pain Intervention(s): Limited activity within patient's tolerance;Monitored during session;Repositioned    Home Living Family/patient expects to be discharged to:: Private residence Living Arrangements: Alone Available Help at Discharge: Personal care attendant  Type of Home: House Home Access: Ramped entrance     Home Layout: Two level;Able to live on main level with bedroom/bathroom(chair lift to basement where laundry is) Home Equipment: Grab bars - toilet;Transport chair;Walker - 2 wheels;Walker - 4 wheels      Prior Function Level of Independence: Needs  assistance      ADL's / Homemaking Assistance Needed: Aide was available 1x/week to assist with household chores however pt reports she will have this aide available 24 hours when she gets home. No one there to confirm at the time of PT session.         Hand Dominance        Extremity/Trunk Assessment   Upper Extremity Assessment Upper Extremity Assessment: Defer to OT evaluation    Lower Extremity Assessment Lower Extremity Assessment: LLE deficits/detail LLE Deficits / Details: Acute pain, decreased strength and AROM consistent with above mentioned surgery.  LLE: Unable to fully assess due to immobilization;Unable to fully assess due to pain    Cervical / Trunk Assessment Cervical / Trunk Assessment: Kyphotic  Communication   Communication: No difficulties  Cognition Arousal/Alertness: Awake/alert Behavior During Therapy: WFL for tasks assessed/performed Overall Cognitive Status: Impaired/Different from baseline Area of Impairment: Memory;Following commands;Safety/judgement                     Memory: Decreased short-term memory Following Commands: Follows one step commands consistently;Follows multi-step commands with increased time Safety/Judgement: Decreased awareness of safety     General Comments: Pt appears confused with how much assistance she will have at home, and what DME she currently has. The story changed multiple times regarding these two topics throughout session.       General Comments      Exercises     Assessment/Plan    PT Assessment Patient needs continued PT services  PT Problem List Decreased strength;Decreased range of motion;Decreased activity tolerance;Decreased balance;Decreased mobility;Decreased knowledge of use of DME;Decreased safety awareness;Decreased knowledge of precautions;Pain       PT Treatment Interventions DME instruction;Gait training;Stair training;Therapeutic activities;Functional mobility training;Therapeutic  exercise;Neuromuscular re-education;Patient/family education    PT Goals (Current goals can be found in the Care Plan section)  Acute Rehab PT Goals Patient Stated Goal: Pt states she wants to return home today PT Goal Formulation: With patient Time For Goal Achievement: 10/18/17 Potential to Achieve Goals: Good    Frequency Min 5X/week   Barriers to discharge        Co-evaluation               AM-PAC PT "6 Clicks" Daily Activity  Outcome Measure Difficulty turning over in bed (including adjusting bedclothes, sheets and blankets)?: A Little Difficulty moving from lying on back to sitting on the side of the bed? : Unable Difficulty sitting down on and standing up from a chair with arms (e.g., wheelchair, bedside commode, etc,.)?: Unable Help needed moving to and from a bed to chair (including a wheelchair)?: Total Help needed walking in hospital room?: A Little Help needed climbing 3-5 steps with a railing? : Total 6 Click Score: 10    End of Session Equipment Utilized During Treatment: Gait belt Activity Tolerance: Patient tolerated treatment well Patient left: in chair;with call bell/phone within reach;with chair alarm set;with family/visitor present Nurse Communication: Mobility status PT Visit Diagnosis: Unsteadiness on feet (R26.81);Other abnormalities of gait and mobility (R26.89);Repeated falls (R29.6);Muscle weakness (generalized) (M62.81);History of falling (Z91.81);Difficulty in walking, not elsewhere classified (R26.2);Pain Pain - Right/Left: Left Pain -  part of body: Leg;Ankle and joints of foot    Time: 0454-0981 PT Time Calculation (min) (ACUTE ONLY): 25 min   Charges:   PT Evaluation $PT Eval Moderate Complexity: 1 Mod PT Treatments $Gait Training: 8-22 mins        Rolinda Roan, PT, DPT Acute Rehabilitation Services Pager: [phone] Office: [phone]   Thelma Comp 10/11/2017, 2:27 PM

## 2017-10-11 NOTE — Progress Notes (Addendum)
Subjective: 1 Day Post-Op Procedure(s) (LRB): INTRAMEDULLARY (IM) NAIL TIBIAL (Left) Patient reports pain as mild.  Feeling well, but ready to leave the hospital  Objective: Vital signs in last 24 hours: Temp:  [98.4 F (36.9 C)-98.5 F (36.9 C)] 98.5 F (36.9 C) (10/07 0454) Pulse Rate:  [79-97] 97 (10/07 0454) Resp:  [16] 16 (10/07 0454) BP: (115-156)/(62-80) 156/69 (10/07 0454) SpO2:  [93 %-97 %] 97 % (10/07 0454)  Intake/Output from previous day: 10/06 0701 - 10/07 0700 In: 1300 [P.O.:600; I.V.:600] Out: 1750 [Urine:1700; Blood:50] Intake/Output this shift: Total I/O In: 1500.3 [P.O.:320; I.V.:1180.3] Out: 500 [Urine:500]  Recent Labs    10/09/17 1342 10/10/17 1120 10/11/17 0631  HGB 12.1 11.4* 10.6*   Recent Labs    10/10/17 1120 10/11/17 0631  WBC 11.9* 12.9*  RBC 3.41* 3.19*  HCT 33.8* 31.9*  PLT 240 246   Recent Labs    10/09/17 1342 10/10/17 1120 10/11/17 0631  NA 139  --  136  K 3.9  --  4.3  CL 99  --  104  CO2 28  --  26  BUN 13  --  12  CREATININE 0.76 0.78 0.69  GLUCOSE 105*  --  110*  CALCIUM 9.3  --  8.7*   No results for input(s): LABPT, INR in the last 72 hours.  Neurologically intact Neurovascular intact Sensation intact distally Intact pulses distally Dorsiflexion/Plantar flexion intact Incision: dressing C/D/I No cellulitis present Compartment soft    Assessment/Plan: 1 Day Post-Op Procedure(s) (LRB): INTRAMEDULLARY (IM) NAIL TIBIAL (Left) Up with therapy  NWB LLE Cam walker to LLE at all times Elevate for pain and swelling Will switch from lovenox to asa 81mg  bid for dvt ppx F/u with Dr. Erlinda Hong in two weeks for suture removal     Aundra Dubin 10/11/2017, 12:42 PM

## 2017-10-13 ENCOUNTER — Encounter
Admission: RE | Admit: 2017-10-13 | Discharge: 2017-10-13 | Disposition: A | Discharge status: Home / Self Care | Payer: Medicare Other | Source: Ambulatory Visit | Attending: Internal Medicine | Admitting: Internal Medicine

## 2017-10-14 ENCOUNTER — Non-Acute Institutional Stay (SKILLED_NURSING_FACILITY): Payer: Medicare Other | Admitting: Adult Health

## 2017-10-14 ENCOUNTER — Other Ambulatory Visit: Payer: Self-pay

## 2017-10-14 ENCOUNTER — Encounter: Payer: Self-pay | Admitting: Adult Health

## 2017-10-14 DIAGNOSIS — G250 Essential tremor: Secondary | ICD-10-CM

## 2017-10-14 DIAGNOSIS — S82402D Unspecified fracture of shaft of left fibula, subsequent encounter for closed fracture with routine healing: Secondary | ICD-10-CM

## 2017-10-14 DIAGNOSIS — S82242S Displaced spiral fracture of shaft of left tibia, sequela: Secondary | ICD-10-CM

## 2017-10-14 DIAGNOSIS — E785 Hyperlipidemia, unspecified: Secondary | ICD-10-CM | POA: Diagnosis not present

## 2017-10-14 DIAGNOSIS — F419 Anxiety disorder, unspecified: Secondary | ICD-10-CM

## 2017-10-14 DIAGNOSIS — I10 Essential (primary) hypertension: Secondary | ICD-10-CM

## 2017-10-14 MED ORDER — DIAZEPAM 5 MG PO TABS: 7.5000 mg | ORAL_TABLET | Freq: Every evening | ORAL | 0 refills | Status: DC | PRN

## 2017-10-14 MED ORDER — HYDROCODONE-ACETAMINOPHEN 7.5-325 MG PO TABS: 1.0000 | ORAL_TABLET | Freq: Four times a day (QID) | ORAL | 0 refills | Status: DC | PRN

## 2017-10-14 NOTE — Progress Notes (Signed)
Location:    [facility] Room Number: 207 A Place of Service:  SNF (31)   CODE STATUS: DNR  No Known Allergies  Chief Complaint  Patient presents with  . Hospitalization Follow-up    Hospital follow up    HPI:  She is a 84 year old who suffered a fall on 10-06-17 and went to the ED. She was found to have a left tibia/fibula fracture. She had a posterior and stirrup splint.  She then went home. She was unable to function at home due to the nonweight bearing restriction. She is here for short term rehab with her goal to return back home. She denies any uncontrolled pain; no changes in appetite; does have constipation. She will continue to be followed for her chronic illnesses including: hypertension; tremor dyslipidemia.   Past Medical History:  Diagnosis Date  . Anxiety   . Arthritis    "may have a touch; all over I guess" (10/11/2017)  . Back pain    "all my back" (10/11/2017)  . Basal cell carcinoma    "have had many taken off; burned" (10/11/2017)  . Chronic orthostatic hypotension June 2012   with pprior syncopal event,  did not tolelrate florinef trial  . Depression   . Hyperlipidemia   . Hypertension   . Urinary frequency     Past Surgical History:  Procedure Laterality Date  . ANKLE FRACTURE SURGERY Left 1998  . CATARACT EXTRACTION W/ INTRAOCULAR LENS  IMPLANT, BILATERAL Bilateral   . FRACTURE SURGERY    . IM NAILING TIBIA Left 10/10/2017  . TIBIA IM NAIL INSERTION Left 10/10/2017   Procedure: INTRAMEDULLARY (IM) NAIL TIBIAL;  Surgeon: Leandrew Koyanagi, MD;  Location: [place];  Service: Orthopedics;  Laterality: Left;  . WRIST FRACTURE SURGERY Right 2005    Social History   Socioeconomic History  . Marital status: Widowed    Spouse name: Not on file  . Number of children: Not on file  . Years of education: Not on file  . Highest education level: Not on file  Occupational History  . 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 Financial resource strain: Not on file  . Food insecurity:    Worry: Not on file    Inability: Not on file  . Transportation needs:    Medical: Not on file    Non-medical: Not on file  Tobacco Use  . Smoking status: Never Smoker  . Smokeless tobacco: Never Used  Substance and Sexual Activity  . Alcohol use: Never    Frequency: Never  . Drug use: Never  . Sexual activity: Not Currently  Lifestyle  . Physical activity:    Days per week: Not on file    Minutes per session: Not on file  . Stress: Not on file  Relationships  . Social connections:    Talks on phone: Not on file    Gets together: Not on file    Attends religious service: Not on file    Active member of club or organization: Not on file    Attends meetings of clubs or organizations: Not on file    Relationship status: Not on file  . Intimate partner violence:    Fear of current or ex partner: Not on file    Emotionally abused: Not on file    Physically abused: Not on file    Forced sexual activity: Not on  file  Other Topics Concern  . Not on file  Social History Narrative  . Not on file   Family History  Problem Relation Age of Onset  . Colon cancer Mother   . Heart disease Father   . Kidney disease Father   . Cancer Neg Hx   . Diabetes Neg Hx       VITAL SIGNS BP (!) 145/75   Pulse 93   Temp (!) 97.5 F (36.4 C)   Resp 16   Ht 5\' 6"  (1.676 m)   Wt 126 lb (57.2 kg)   SpO2 100%   BMI 20.34 kg/m   Outpatient Encounter Medications as of 10/14/2017  Medication Sig  . amLODipine (NORVASC) 10 MG tablet Take 10 mg by mouth daily.  [name] aspirin EC 81 MG tablet Take 1 tablet (81 mg total) by mouth 2 (two) times daily.  . diazepam (VALIUM) 5 MG tablet Take 1.5 tablets (7.5 mg total) by mouth at bedtime as needed (insomnia). for anxiety  . docusate sodium (COLACE) 100 MG capsule Take 100 mg by mouth 2 (two) times daily.  . Garlic Oil 4650 MG CAPS Take 1  capsule by mouth daily.   [name] HYDROcodone-acetaminophen (NORCO) 7.5-325 MG tablet Take 1-2 tablets by mouth every 6 (six) hours as needed for moderate pain.  . hydrOXYzine (ATARAX/VISTARIL) 25 MG tablet TAKE ONE CAPSULE BY MOUTH THREE TIMES DAILY AS NEEDED  . Iron-Vitamins (GERITOL PO) Take 1 tablet by mouth daily.  [name] lactulose (CHRONULAC) 10 GM/15ML solution TAKE 30MLS(0NE OUNCE) BY MOUTH 2 TIMES DAILY AS NEEDED FOR MILD CONSTIPATION. DO NOT USE MORE THAN ONCE A WEEK  . Lutein 20 MG CAPS Take 20 mg by mouth daily.    . NON FORMULARY Diet type: NAS  . pravastatin (PRAVACHOL) 20 MG tablet Take 1 tablet (20 mg total) by mouth daily.  . propranolol (INDERAL) 60 MG tablet Take 60 mg by mouth at bedtime.  . senna-docusate (SENOKOT-S) 8.6-50 MG tablet Take 1 tablet by mouth at bedtime.  [name] VITAMIN E PO Take 400 Units by mouth daily.   . [DISCONTINUED] docusate sodium (COLACE) 100 MG capsule Take 1 capsule (100 mg total) by mouth 2 (two) times daily. (Patient not taking: Reported on 10/14/2017)  . [DISCONTINUED] propranolol ER (INDERAL LA) 60 MG 24 hr capsule TAKE ONE CAPSULE BY MOUTH AT BEDTIME (Patient not taking: No sig reported)   No facility-administered encounter medications on file as of 10/14/2017.      SIGNIFICANT DIAGNOSTIC EXAMS   TODAY:   10-06-17: Left tibia/fibula x-ray: Acute fractures of the tibia and fibulaChronic healed fracture of the medial and lateral malleolus.   LABS REVIEWED TODAY:   10-06-17: wbc 8.8; hgb 13.2; hct 37.3; mcv 100.7; plt 270; glucose 98; bun 13; creat 0.79; k+ 4.6; na++ 135; ca 9.2 liver normal albumin 4.3  10-11-17: wbc 12.9; hgb 10.7; hct 31.9; mcv 100.0; plt 246; glucose 110; bun 12; creat 0.69; k+ 4.3 ;na++ 136; ca 8.7     Review of Systems  Constitutional: Negative for malaise/fatigue.  Respiratory: Negative for cough and shortness of breath.   Cardiovascular: Negative for chest pain, palpitations and leg swelling.  Gastrointestinal: Positive for  constipation. Negative for abdominal pain and heartburn.  Musculoskeletal: Positive for joint pain. Negative for back pain and myalgias.       Left ankle pain is managed   Skin: Negative.   Neurological: Negative for dizziness.  Psychiatric/Behavioral: The patient is not nervous/anxious.    Physical  Exam  Constitutional: She is oriented to person, place, and time. She appears well-developed and well-nourished. No distress.  Frail   Neck: No thyromegaly present.  Cardiovascular: Normal rate, regular rhythm, normal heart sounds and intact distal pulses.  Pulmonary/Chest: Effort normal and breath sounds normal. No respiratory distress.  Abdominal: Soft. Bowel sounds are normal. She exhibits no distension. There is no tenderness.  Musculoskeletal: She exhibits no edema.  Is able to move all extremities Is status post left tibia/fibula fracture: has ace wrap and boot on is able to wiggle toes has rapid capillary refill   Lymphadenopathy:    She has no cervical adenopathy.  Neurological: She is alert and oriented to person, place, and time.  Skin: Skin is warm and dry. She is not diaphoretic.  Psychiatric: She has a normal mood and affect.      ASSESSMENT/ PLAN:  TODAY:   1. Essential hypertension: stable b/p 145/75 will continue norvasc 10 mg asa 81 mg daily   2. Benign essential tremor: is stable will continue inderal 60 mg nightly   3. Hyperlipidemia LDL goal <130: is stable will continue pravachol 20 mg daily   4. Closed fracture of shft of left fibular with routine healing/closed displaced spiral fracture of shaft of left tibia: is stable will continue therapy as directed and will follow up with orthopedics; will continue vicodin 7.5/325 mg 1 or 2 tabs every 6 hours as needed  5. Chronic constipation: is worse: will stop colace and will change senna s to 2 tabs twice daily   6. Anxiety is stable will continue valium 7.5 mg nightly as needed for the next 14 days.          MD is aware of resident's narcotic use and is in agreement with current plan of care. We will attempt to wean resident as apropriate   Ok Edwards NP [facility]  Contact [phone] Monday through Friday 8am- 5pm  After hours call [phone]

## 2017-10-14 NOTE — Telephone Encounter (Signed)
Rx sent to [facility] phone : [phone] , fax : [phone]  

## 2017-10-15 ENCOUNTER — Other Ambulatory Visit: Payer: Self-pay

## 2017-10-15 MED ORDER — DIAZEPAM 5 MG PO TABS: 5.0000 mg | ORAL_TABLET | Freq: Two times a day (BID) | ORAL | 0 refills | Status: DC

## 2017-10-15 NOTE — Telephone Encounter (Signed)
Rx sent to [facility] phone : [phone] , fax : [phone]  

## 2017-10-19 ENCOUNTER — Other Ambulatory Visit
Admission: RE | Admit: 2017-10-19 | Discharge: 2017-10-19 | Disposition: A | Discharge status: Home / Self Care | Payer: Medicare Other | Source: Ambulatory Visit | Attending: Internal Medicine | Admitting: Internal Medicine

## 2017-10-19 DIAGNOSIS — R3915 Urgency of urination: Secondary | ICD-10-CM | POA: Diagnosis present

## 2017-10-19 DIAGNOSIS — R32 Unspecified urinary incontinence: Secondary | ICD-10-CM | POA: Diagnosis present

## 2017-10-19 LAB — URINALYSIS, ROUTINE W REFLEX MICROSCOPIC
Bilirubin Urine: NEGATIVE
Glucose, UA: NEGATIVE mg/dL
Hgb urine dipstick: NEGATIVE
Ketones, ur: NEGATIVE mg/dL
Leukocytes, UA: NEGATIVE
Nitrite: NEGATIVE
Protein, ur: NEGATIVE mg/dL
Specific Gravity, Urine: 1.003 — ABNORMAL LOW (ref 1.005–1.030)
pH: 7 (ref 5.0–8.0)

## 2017-10-20 ENCOUNTER — Non-Acute Institutional Stay (SKILLED_NURSING_FACILITY): Payer: Medicare Other | Admitting: Adult Health

## 2017-10-20 ENCOUNTER — Encounter: Payer: Self-pay | Admitting: Adult Health

## 2017-10-20 DIAGNOSIS — L309 Dermatitis, unspecified: Secondary | ICD-10-CM | POA: Diagnosis not present

## 2017-10-20 LAB — URINE CULTURE: Culture: NO GROWTH

## 2017-10-20 NOTE — Progress Notes (Signed)
[facility]   [facility] Room Number: 207 A Place of Service:  SNF (31)   CODE STATUS: Full Code  No Known Allergies  Chief Complaint  Patient presents with  . Acute Visit    Rash    HPI:  She has an itchy rash on her back; it is red and slightly raised. She denies any changes in medications or soaps. There are no reports of fevers. The rash started yesterday.    Past Medical History:  Diagnosis Date  . Anxiety   . Arthritis    "may have a touch; all over I guess" (10/11/2017)  . Back pain    "all my back" (10/11/2017)  . Basal cell carcinoma    "have had many taken off; burned" (10/11/2017)  . Chronic orthostatic hypotension June 2012   with pprior syncopal event,  did not tolelrate florinef trial  . Constipation 08/12/2013  . Depression   . Hyperlipidemia   . Hypertension   . Urinary frequency     Past Surgical History:  Procedure Laterality Date  . ANKLE FRACTURE SURGERY Left 1998  . CATARACT EXTRACTION W/ INTRAOCULAR LENS  IMPLANT, BILATERAL Bilateral   . FRACTURE SURGERY    . IM NAILING TIBIA Left 10/10/2017  . TIBIA IM NAIL INSERTION Left 10/10/2017   Procedure: INTRAMEDULLARY (IM) NAIL TIBIAL;  Surgeon: Leandrew Koyanagi, MD;  Location: [place];  Service: Orthopedics;  Laterality: Left;  . WRIST FRACTURE SURGERY Right 2005    Social History   Socioeconomic History  . Marital status: Widowed    Spouse name: Not on file  . Number of children: Not on file  . Years of education: Not on file  . Highest education level: Not on file  Occupational History  . 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 Financial resource strain: Not on file  . Food insecurity:    Worry: Not on file    Inability: Not on file  . Transportation needs:    Medical: Not on file    Non-medical: Not on file  Tobacco Use  . Smoking status: Never Smoker  . Smokeless tobacco: Never Used  Substance and  Sexual Activity  . Alcohol use: Never    Frequency: Never  . Drug use: Never  . Sexual activity: Not Currently  Lifestyle  . Physical activity:    Days per week: Not on file    Minutes per session: Not on file  . Stress: Not on file  Relationships  . Social connections:    Talks on phone: Not on file    Gets together: Not on file    Attends religious service: Not on file    Active member of club or organization: Not on file    Attends meetings of clubs or organizations: Not on file    Relationship status: Not on file  . Intimate partner violence:    Fear of current or ex partner: Not on file    Emotionally abused: Not on file    Physically abused: Not on file    Forced sexual activity: Not on file  Other Topics Concern  . Not on file  Social History Narrative  . Not on file   Family History  Problem Relation Age of Onset  . Colon cancer Mother   . Heart disease Father   . Kidney disease Father   . Cancer Neg Hx   . Diabetes  Neg Hx       VITAL SIGNS BP (!) 95/52   Pulse 61   Temp 98 F (36.7 C)   Resp 16   Ht 5\' 6"  (1.676 m)   Wt 121 lb 4.8 oz (55 kg)   SpO2 100%   BMI 19.58 kg/m   Outpatient Encounter Medications as of 10/20/2017  Medication Sig  . amLODipine (NORVASC) 10 MG tablet Take 10 mg by mouth daily.  . Cholecalciferol 4000 units CAPS Take 1 capsule by mouth daily.  . diazepam (VALIUM) 5 MG tablet Take 1 tablet (5 mg total) by mouth 2 (two) times daily.  . Garlic Oil 1749 MG CAPS Take 1 capsule by mouth daily.   [name] HYDROcodone-acetaminophen (NORCO) 7.5-325 MG tablet Take 1-2 tablets by mouth every 6 (six) hours as needed for moderate pain.  . hydrOXYzine (ATARAX/VISTARIL) 25 MG tablet TAKE ONE CAPSULE BY MOUTH THREE TIMES DAILY AS NEEDED  . Iron-Vitamins (GERITOL PO) Take 1 tablet by mouth daily.  [name] lactulose (CHRONULAC) 10 GM/15ML solution TAKE 30MLS(0NE OUNCE) BY MOUTH 2 TIMES DAILY AS NEEDED FOR MILD CONSTIPATION. DO NOT USE MORE THAN ONCE A WEEK    . Lutein 20 MG CAPS Take 20 mg by mouth daily.    . NON FORMULARY Diet type: NAS  . propranolol (INDERAL) 60 MG tablet Take 60 mg by mouth at bedtime.  . rivaroxaban (XARELTO) 10 MG TABS tablet Take 10 mg by mouth daily.  . sennosides-docusate sodium (SENOKOT-S) 8.6-50 MG tablet Take 2 tablets by mouth 2 (two) times daily.  [name] VITAMIN E PO Take 400 Units by mouth daily.   . [DISCONTINUED] aspirin EC 81 MG tablet Take 1 tablet (81 mg total) by mouth 2 (two) times daily. (Patient not taking: Reported on 10/20/2017)  . [DISCONTINUED] docusate sodium (COLACE) 100 MG capsule Take 100 mg by mouth 2 (two) times daily.  . [DISCONTINUED] pravastatin (PRAVACHOL) 20 MG tablet Take 1 tablet (20 mg total) by mouth daily. (Patient not taking: Reported on 10/20/2017)  . [DISCONTINUED] senna-docusate (SENOKOT-S) 8.6-50 MG tablet Take 1 tablet by mouth at bedtime. (Patient not taking: Reported on 10/20/2017)   No facility-administered encounter medications on file as of 10/20/2017.      SIGNIFICANT DIAGNOSTIC EXAMS  PREVIOUS:   10-06-17: Left tibia/fibula x-ray: Acute fractures of the tibia and fibulaChronic healed fracture of the medial and lateral malleolus.  NO NEW EXAMS.    LABS REVIEWED PREVIOUS:   10-06-17: wbc 8.8; hgb 13.2; hct 37.3; mcv 100.7; plt 270; glucose 98; bun 13; creat 0.79; k+ 4.6; na++ 135; ca 9.2 liver normal albumin 4.3  10-11-17: wbc 12.9; hgb 10.7; hct 31.9; mcv 100.0; plt 246; glucose 110; bun 12; creat 0.69; k+ 4.3 ;na++ 136; ca 8.7  TODAY:   10-18-17: urine culture: no growth     Review of Systems  Constitutional: Negative for malaise/fatigue.  Respiratory: Negative for cough and shortness of breath.   Cardiovascular: Negative for chest pain, palpitations and leg swelling.  Gastrointestinal: Negative for abdominal pain, constipation and heartburn.  Musculoskeletal: Negative for back pain, joint pain and myalgias.  Skin: Positive for itching and rash.       On back    Neurological: Negative for dizziness.  Psychiatric/Behavioral: The patient is not nervous/anxious.     Physical Exam  Constitutional: She is oriented to person, place, and time. She appears well-developed and well-nourished. No distress.  Frail   Neck: No thyromegaly present.  Cardiovascular: Normal rate, regular rhythm, normal heart  sounds and intact distal pulses.  Pulmonary/Chest: Effort normal and breath sounds normal. No respiratory distress.  Abdominal: Soft. Bowel sounds are normal. She exhibits no distension. There is no tenderness.  Musculoskeletal: She exhibits no edema.  Is able to move all extremities Is status post left tibia/fibula fracture: has ace wrap and boot on is able to wiggle toes has rapid capillary refill    Lymphadenopathy:    She has no cervical adenopathy.  Neurological: She is alert and oriented to person, place, and time.  Skin: Skin is warm and dry. Rash noted. She is not diaphoretic.  Has mild dermatitis on back   Psychiatric: She has a normal mood and affect.       ASSESSMENT/ PLAN:  TODAY:   1. Dermatitis: is worse: will begin hydrocortisone cream 1% to back twice daily until resolved.    MD is aware of resident's narcotic use and is in agreement with current plan of care. We will attempt to wean resident as apropriate   Ok Edwards NP [facility]  Contact [phone] Monday through Friday 8am- 5pm  After hours call [phone]

## 2017-10-21 ENCOUNTER — Non-Acute Institutional Stay (SKILLED_NURSING_FACILITY): Payer: Medicare Other | Admitting: Adult Health

## 2017-10-21 ENCOUNTER — Other Ambulatory Visit: Payer: Self-pay

## 2017-10-21 ENCOUNTER — Encounter: Payer: Self-pay | Admitting: Adult Health

## 2017-10-21 DIAGNOSIS — S82202S Unspecified fracture of shaft of left tibia, sequela: Secondary | ICD-10-CM

## 2017-10-21 DIAGNOSIS — G250 Essential tremor: Secondary | ICD-10-CM

## 2017-10-21 DIAGNOSIS — S82402S Unspecified fracture of shaft of left fibula, sequela: Secondary | ICD-10-CM

## 2017-10-21 DIAGNOSIS — D62 Acute posthemorrhagic anemia: Secondary | ICD-10-CM | POA: Diagnosis not present

## 2017-10-21 DIAGNOSIS — F419 Anxiety disorder, unspecified: Secondary | ICD-10-CM

## 2017-10-21 DIAGNOSIS — I10 Essential (primary) hypertension: Secondary | ICD-10-CM

## 2017-10-21 MED ORDER — HYDROCODONE-ACETAMINOPHEN 5-325 MG PO TABS: 1.0000 | ORAL_TABLET | Freq: Three times a day (TID) | ORAL | 0 refills | Status: DC | PRN

## 2017-10-21 MED ORDER — DIAZEPAM 5 MG PO TABS: 5.0000 mg | ORAL_TABLET | Freq: Every evening | ORAL | 0 refills | Status: DC | PRN

## 2017-10-21 NOTE — Progress Notes (Signed)
[facility]  [facility] Room Number: [place] of Service:  SNF (31) Provider:  Durenda Age, NP  Patient Care Team: Baxter Hire, MD as PCP - General (Internal Medicine)  Extended Emergency Contact Information Primary Emergency Contact: Alanda Amass of [place] Phone: [phone] Relation: Niece Secondary Emergency Contact: Briggs,Bernice Mobile Phone: [phone] Relation: Nephew  Code Status:  Full Code  Goals of care: Advanced Directive information Advanced Directives 10/20/2017  Does Patient Have a Medical Advance Directive? Yes  Type of Advance Directive Living will  Does patient want to make changes to medical advance directive? No - Patient declined  Copy of [place] in Chart? -  Would patient like information on creating a medical advance directive? -     Chief Complaint  Patient presents with  . Acute Visit    Hypotension and pain management    HPI:  Pt is a 84 y.o. female seen today for an acute visit. She was admitted to the [facility] on 10/14/2017 from a recent hospitalization.  She reported falling at her backyard for which she sustained displaced fracture of left tibia.  She had intramedullary nailing and closed treatment of left fibular shaft fracture with manipulation on 10/10/2017.  She has been admitted at the [facility] for short-term rehabilitation. She denies having any pain at this time, 0/10. Review of vital signs showed that she has low BPs -109/45, 113/49, 95/52, 106/60. She reported taking Propanolol 60 mg Q other HS for her tremors. Current order showed that she has Valium 7.5 mg BID. Charge nurse said that she has been refusing the morning dose because it makes her sleepy and that she takes it only @ HS at home.   Past Medical History:  Diagnosis Date  . Anxiety   . Arthritis    "may have a touch; all over I guess" (10/11/2017)  . Back pain      "all my back" (10/11/2017)  . Basal cell carcinoma    "have had many taken off; burned" (10/11/2017)  . Chronic orthostatic hypotension June 2012   with pprior syncopal event,  did not tolelrate florinef trial  . Constipation 08/12/2013  . Depression   . Hyperlipidemia   . Hypertension   . Urinary frequency    Past Surgical History:  Procedure Laterality Date  . ANKLE FRACTURE SURGERY Left 1998  . CATARACT EXTRACTION W/ INTRAOCULAR LENS  IMPLANT, BILATERAL Bilateral   . FRACTURE SURGERY    . IM NAILING TIBIA Left 10/10/2017  . TIBIA IM NAIL INSERTION Left 10/10/2017   Procedure: INTRAMEDULLARY (IM) NAIL TIBIAL;  Surgeon: Leandrew Koyanagi, MD;  Location: [place];  Service: Orthopedics;  Laterality: Left;  . WRIST FRACTURE SURGERY Right 2005    No Known Allergies  Outpatient Encounter Medications as of 10/21/2017  Medication Sig  . amLODipine (NORVASC) 5 MG tablet Take 5 mg by mouth daily.  . Cholecalciferol 4000 units CAPS Take 1 capsule by mouth daily.  . Garlic Oil 9528 MG CAPS Take 1 capsule by mouth daily.   . hydrocortisone cream 1 % Apply 1 application topically 2 (two) times daily. Apply to rash on back until resolved  . hydrOXYzine (ATARAX/VISTARIL) 25 MG tablet TAKE ONE CAPSULE BY MOUTH THREE TIMES DAILY AS NEEDED  . Iron-Vitamins (GERITOL PO) Take 1 tablet by mouth daily.  [name] lactulose (CHRONULAC) 10 GM/15ML solution TAKE 30MLS(0NE OUNCE) BY MOUTH 2 TIMES DAILY AS NEEDED FOR MILD CONSTIPATION.  DO NOT USE MORE THAN ONCE A WEEK  . Lutein 20 MG CAPS Take 20 mg by mouth daily.    . NON FORMULARY Diet type: NAS  . propranolol (INDERAL) 60 MG tablet Take 60 mg by mouth at bedtime.  . rivaroxaban (XARELTO) 10 MG TABS tablet Take 10 mg by mouth daily.  . sennosides-docusate sodium (SENOKOT-S) 8.6-50 MG tablet Take 2 tablets by mouth 2 (two) times daily.  [name] VITAMIN E PO Take 400 Units by mouth daily.   . [DISCONTINUED] diazepam (VALIUM) 5 MG tablet Take 5 mg by mouth at bedtime as  needed for muscle spasms.  . [DISCONTINUED] HYDROcodone-acetaminophen (NORCO/VICODIN) 5-325 MG tablet Take 1 tablet by mouth every 8 (eight) hours as needed for moderate pain.  . [DISCONTINUED] amLODipine (NORVASC) 10 MG tablet Take 10 mg by mouth daily.  . [DISCONTINUED] diazepam (VALIUM) 5 MG tablet Take 1 tablet (5 mg total) by mouth 2 (two) times daily.  . [DISCONTINUED] HYDROcodone-acetaminophen (NORCO) 7.5-325 MG tablet Take 1-2 tablets by mouth every 6 (six) hours as needed for moderate pain.   No facility-administered encounter medications on file as of 10/21/2017.     Review of Systems  GENERAL: No change in appetite, no fatigue, no weight changes, no fever, chills or weakness MOUTH and THROAT: Denies oral discomfort, gingival pain or bleeding RESPIRATORY: no cough, SOB, DOE, wheezing, hemoptysis CARDIAC: No chest pain, edema or palpitations GI: No abdominal pain, diarrhea, constipation, heart burn, nausea or vomiting GU: Denies dysuria, frequency, hematuria, incontinence, or discharge PSYCHIATRIC: Denies feelings of depression or anxiety. No report of hallucinations, insomnia, paranoia, or agitation    There is no immunization history on file for this patient. Pertinent  Health Maintenance Due  Topic Date Due  . DEXA SCAN  Completed  . INFLUENZA VACCINE  Discontinued  . PNA vac Low Risk Adult  Discontinued   Fall Risk  06/16/2016 01/04/2015 06/13/2014 03/30/2013 03/03/2012  Falls in the past year? Yes No No No No  Number falls in past yr: 1 - - - -  Injury with Fall? No - - - -      Vitals:   10/21/17 1634 10/21/17 1636  BP: (!) 109/45 (!) 113/49  Pulse: 63   Resp: 18   Temp: (!) 97.5 F (36.4 C)   TempSrc: Oral   SpO2: 98%   Weight: 121 lb 4.8 oz (55 kg)   Height: 5\' 6"  (1.676 m)    Body mass index is 19.58 kg/m.  Physical Exam  GENERAL APPEARANCE: Well nourished. In no acute distress.  SKIN:  Left lower leg covered with ACE wrap and has CAM boot MOUTH and  THROAT: Lips are without lesions. Oral mucosa is moist and without lesions. Tongue is normal in shape, size, and color and without lesions RESPIRATORY: Breathing is even & unlabored, BS CTAB CARDIAC: RRR, no murmur,no extra heart sounds, no edema GI: Abdomen soft, normal BS, no masses, no tenderness EXTREMITIES:  Able to move X 4 extremities, LLE on ACE wrap and CAM boot, able to wiggle left foot toes NEUROLOGICAL: There is no tremor. Speech is clear PSYCHIATRIC: Alert and oriented X 3. Affect and behavior are appropriate   Labs reviewed: Recent Labs    10/06/17 1029 10/09/17 1342 10/10/17 1120 10/11/17 0631  NA 135 139  --  136  K 4.6 3.9  --  4.3  CL 100 99  --  104  CO2 28 28  --  26  GLUCOSE 98 105*  --  110*  BUN 13 13  --  12  CREATININE 0.79 0.76 0.78 0.69  CALCIUM 9.2 9.3  --  8.7*   Recent Labs    11/30/16 1310 10/06/17 1029  AST 24 22  ALT 12* 12  ALKPHOS 48 42  BILITOT 1.0 0.7  PROT 6.7 7.2  ALBUMIN 3.8 4.3   Recent Labs    11/30/16 1310  10/06/17 1029 10/09/17 1342 10/10/17 1120 10/11/17 0631  WBC 6.1  --  8.8 10.6* 11.9* 12.9*  NEUTROABS 3.7  --  6.4  --   --   --   HGB 12.5   < > 13.2 12.1 11.4* 10.6*  HCT 35.8*   < > 37.3 35.5* 33.8* 31.9*  MCV 98.1  --  100.7* 97.5 99.1 100.0  PLT 294  --  270 271 240 246   < > = values in this interval not displayed.   Lab Results  Component Value Date   TSH 1.92 01/04/2015   No results found for: HGBA1C Lab Results  Component Value Date   CHOL 254 (H) 06/09/2016   HDL 60.00 06/09/2016   LDLCALC 174 (H) 06/09/2016   LDLDIRECT 145.6 12/21/2012   TRIG 103.0 06/09/2016   CHOLHDL 4 06/09/2016    Significant Diagnostic Results in last 30 days:  Dg Tibia/fibula Left  Result Date: 10/10/2017 CLINICAL DATA:  84 year old female undergoing left tibia ORIF for comminuted spiral fracture of the distal diaphysis. Preexisting left ankle bimalleolar ORIF. EXAM: DG C-ARM 61-120 MIN; LEFT TIBIA AND FIBULA - 2 VIEW  COMPARISON:  10/09/2017. FINDINGS: Five intraoperative fluoroscopic spot views of the left tibia. An intramedullary rod has been placed with 2 interlocking proximal and 1 interlocking distal cortical screws. Superimposed chronic distal left fibula and medial malleolus hardware. Near anatomic alignment now about the spiral distal left tibia shaft fracture. Likewise, near anatomic alignment at the visible proximal left fibula shaft spiral fracture. IMPRESSION: ORIF left tibia shaft with no adverse features. FLUOROSCOPY TIME:  2 minutes 32 seconds Electronically Signed   By: Genevie Ann M.D.   On: 10/10/2017 09:16   Dg Tibia/fibula Left  Result Date: 10/09/2017 CLINICAL DATA:  Known fracture.  Worsening pain. EXAM: LEFT TIBIA AND FIBULA - 2 VIEW COMPARISON:  10/06/2017 FINDINGS: Displaced comminuted fracture noted through the distal tibial metaphysis and proximal to mid fibular shaft. These are essentially unchanged since prior study. Plate and screw fixation device noted in the distal fibula with screws in the medial malleolus from remote injury. IMPRESSION: Displaced, comminuted distal tibial metaphyseal and fibular shaft fractures, stable. Electronically Signed   By: Rolm Baptise M.D.   On: 10/09/2017 12:57   Dg Tibia/fibula Left  Result Date: 10/06/2017 CLINICAL DATA:  Fall.  Leg pain. EXAM: LEFT TIBIA AND FIBULA - 2 VIEW COMPARISON:  04/05/2017 FINDINGS: Acute fracture proximal fibula with mild displacement Spiral fracture distal tibia with 1 cm of lateral displacement. Chronic healed bimalleolar fracture with screw and plate fixation. IMPRESSION: Acute fractures of the tibia and fibula Chronic healed fracture of the medial and lateral malleolus. Electronically Signed   By: Franchot Gallo M.D.   On: 10/06/2017 11:03   Dg Ankle Complete Left  Result Date: 10/06/2017 CLINICAL DATA:  Fall.  Leg pain EXAM: LEFT ANKLE COMPLETE - 3+ VIEW COMPARISON:  04/05/2017 FINDINGS: Acute spiral fracture distal tibia with 1  cm lateral displacement Chronic healed fractures of the mediolateral malleolus with surgical fixation. Ankle mortise intact. IMPRESSION: Acute spiral fracture distal tibia. Electronically Signed  By: Franchot Gallo M.D.   On: 10/06/2017 11:05   Dg Chest Port 1 View  Result Date: 10/09/2017 CLINICAL DATA:  84 year old female with a history of leg fracture EXAM: PORTABLE CHEST 1 VIEW COMPARISON:  04/09/2010 FINDINGS: Cardiomediastinal silhouette unchanged in size and contour. No pneumothorax. No pleural effusion. No confluent airspace disease. Similar appearance of chronic interstitial opacities. No displaced fracture. IMPRESSION: Chronic lung changes without evidence of acute cardiopulmonary disease. Electronically Signed   By: Corrie Mckusick D.O.   On: 10/09/2017 14:25   Dg C-arm 1-60 Min  Result Date: 10/10/2017 CLINICAL DATA:  84 year old female undergoing left tibia ORIF for comminuted spiral fracture of the distal diaphysis. Preexisting left ankle bimalleolar ORIF. EXAM: DG C-ARM 61-120 MIN; LEFT TIBIA AND FIBULA - 2 VIEW COMPARISON:  10/09/2017. FINDINGS: Five intraoperative fluoroscopic spot views of the left tibia. An intramedullary rod has been placed with 2 interlocking proximal and 1 interlocking distal cortical screws. Superimposed chronic distal left fibula and medial malleolus hardware. Near anatomic alignment now about the spiral distal left tibia shaft fracture. Likewise, near anatomic alignment at the visible proximal left fibula shaft spiral fracture. IMPRESSION: ORIF left tibia shaft with no adverse features. FLUOROSCOPY TIME:  2 minutes 32 seconds Electronically Signed   By: Genevie Ann M.D.   On: 10/10/2017 09:16    Assessment/Plan  1. Closed fracture of left tibia and fibula, sequela -S/P IM nail, verbalize having no pain, will decrease Norco 7.5-325 mg 1-2 tabs every 6 hours as needed to Norco 5-325 mg 1 tab every 8 hours as needed for pain, continue Xarelto 10 mg 1 tab daily for DVT  prophylaxis, continue PT and OT, for therapeutic strengthening exercises, fall precautions   2. Anxiety -mood this is stable, will discontinue diazepam 7.5 mg twice daily and start diazepam 5 mg 1 tab nightly as needed   3. Benign essential tremor -will continue home medication of propanolol 60 mg 1 tab q. Other day at bedtime instead of daily at bedtime   4. Postoperative anemia due to acute blood loss -will recheck CBC Lab Results  Component Value Date   HGB 10.6 (L) 10/11/2017   5.  Essential hypertension -BP is noted to be on the low side, will decrease amlodipine from 10 mg daily to 5 mg 1 tab daily, propanolol 60 mg will be given every other at bedtime (home dosage), BP/HR twice daily x5 days      Family/ staff Communication: Discussed plan of care with patient.  Labs/tests ordered:  CBC  Goals of care:   Short-term care  Durenda Age, NP [facility] and Adult [facility] [phone] (Monday-Friday 8:00 a.m. - 5:00 p.m.) [phone] (after hours)

## 2017-10-21 NOTE — Telephone Encounter (Signed)
Hard scripts provided by Durenda Age, NP to patient's SNF nurse.

## 2017-10-22 ENCOUNTER — Encounter (INDEPENDENT_AMBULATORY_CARE_PROVIDER_SITE_OTHER): Payer: Self-pay | Admitting: Physician Assistant

## 2017-10-22 ENCOUNTER — Other Ambulatory Visit
Admission: RE | Admit: 2017-10-22 | Discharge: 2017-10-22 | Disposition: A | Discharge status: Home / Self Care | Payer: Medicare Other | Source: Ambulatory Visit | Attending: Adult Health | Admitting: Adult Health

## 2017-10-22 ENCOUNTER — Ambulatory Visit (INDEPENDENT_AMBULATORY_CARE_PROVIDER_SITE_OTHER): Payer: Medicare Other

## 2017-10-22 ENCOUNTER — Ambulatory Visit (INDEPENDENT_AMBULATORY_CARE_PROVIDER_SITE_OTHER): Payer: Medicare Other | Admitting: Physician Assistant

## 2017-10-22 DIAGNOSIS — S82202S Unspecified fracture of shaft of left tibia, sequela: Secondary | ICD-10-CM

## 2017-10-22 DIAGNOSIS — D649 Anemia, unspecified: Secondary | ICD-10-CM | POA: Diagnosis present

## 2017-10-22 DIAGNOSIS — S82402S Unspecified fracture of shaft of left fibula, sequela: Secondary | ICD-10-CM

## 2017-10-22 LAB — CBC WITH DIFFERENTIAL/PLATELET
Abs Immature Granulocytes: 0.04 10*3/uL (ref 0.00–0.07)
Basophils Absolute: 0.1 10*3/uL (ref 0.0–0.1)
Basophils Relative: 1 %
Eosinophils Absolute: 0.3 10*3/uL (ref 0.0–0.5)
Eosinophils Relative: 5 %
HCT: 33.6 % — ABNORMAL LOW (ref 36.0–46.0)
Hemoglobin: 10.9 g/dL — ABNORMAL LOW (ref 12.0–15.0)
Immature Granulocytes: 1 %
Lymphocytes Relative: 27 %
Lymphs Abs: 1.9 10*3/uL (ref 0.7–4.0)
MCH: 33.1 pg (ref 26.0–34.0)
MCHC: 32.4 g/dL (ref 30.0–36.0)
MCV: 102.1 fL — ABNORMAL HIGH (ref 80.0–100.0)
Monocytes Absolute: 0.7 10*3/uL (ref 0.1–1.0)
Monocytes Relative: 10 %
Neutro Abs: 4 10*3/uL (ref 1.7–7.7)
Neutrophils Relative %: 56 %
Platelets: 468 10*3/uL — ABNORMAL HIGH (ref 150–400)
RBC: 3.29 MIL/uL — ABNORMAL LOW (ref 3.87–5.11)
RDW: 12.9 % (ref 11.5–15.5)
WBC: 7 10*3/uL (ref 4.0–10.5)
nRBC: 0 % (ref 0.0–0.2)

## 2017-10-22 NOTE — Progress Notes (Signed)
Post-Op Visit Note   Patient: [name]           Date of Birth: 10-12-1926           MRN: [id number] Visit Date: 10/22/2017 PCP: Baxter Hire, MD   Assessment & Plan:  Chief Complaint:  Chief Complaint  Patient presents with  . Left Leg - Pain   Visit Diagnoses:  1. Closed fracture of left tibia and fibula, sequela     Plan: Patient is a pleasant 84 year old female who presents to our clinic today 12 days status post IM tibial nail, date of surgery 10/10/2017.  She has been compliant nonweightbearing in a cam walker.  She has very minimal to no pain to the left lower extremity.  No fevers or chills.  No drainage from the incisions.  Examination of the left lower extremity reveals well-healing surgical incisions with nylon sutures in place.  She does have a small fracture blister which has started to develop eschar to the distal tibia.  No signs of infection or drainage there.  Calf is soft and nontender.  She is neurovascularly intact distally.  At this point, nylon sutures were removed.  We applied mupirocin dressing to the fracture blister which she will continue to do twice daily for the next few weeks.  This was written in her papers to take back to her living facility.  She will remain nonweightbearing in a cam walker for another 4 weeks.  Follow-up with [place] at that point for repeat evaluation and x-ray.  Call with concerns or questions in the meantime.  Follow-Up Instructions: Return in about 4 weeks (around 11/19/2017).   Orders:  Orders Placed This Encounter  Procedures  . XR Tibia/Fibula Left   No orders of the defined types were placed in this encounter.   Imaging: Xr Tibia/fibula Left  Result Date: 10/22/2017 Stable fixation of the fracture with very minimal evidence of healing   PMFS History: Patient Active Problem List   Diagnosis Date Noted  . Closed fracture of shaft of left fibula with routine healing   . Closed displaced spiral fracture of shaft  of left tibia 10/09/2017  . Tibia/fibula fracture 10/09/2017  . 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 Iliac bone pain 07/29/2015  . Osteoporosis 03/13/2015  . Raynaud phenomenon 02/24/2015  . Hyponatremia 02/09/2015  . Urinary frequency 01/30/2015  . Medicare annual wellness visit, subsequent 01/06/2015  . Encounter for preventive health examination 01/06/2015  . Underweight 06/15/2014  . Chronic left hip pain 06/13/2014  . Benign essential tremor 10/02/2013  . Constipation 08/12/2013  . Urinary incontinence, urge 05/29/2012  . Nocturnal leg cramps 12/15/2011  . Screening for breast cancer 10/27/2011  . Degeneration of lumbar or lumbosacral intervertebral disc 09/01/2011  . Insomnia 01/07/2011  . Hyperlipidemia LDL goal <130 11/08/2010  . Back pain, sacroiliac 11/08/2010  . Anxiety   . Essential hypertension 09/18/2010   Past Medical History:  Diagnosis Date  . Anxiety   . Arthritis    "may have a touch; all over I guess" (10/11/2017)  . Back pain    "all my back" (10/11/2017)  . Basal cell carcinoma    "have had many taken off; burned" (10/11/2017)  . Chronic orthostatic hypotension June 2012   with pprior syncopal event,  did not tolelrate florinef trial  . Constipation 08/12/2013  . Depression   . Hyperlipidemia   . Hypertension   . Urinary frequency     Family History  Problem Relation Age of Onset  . Colon cancer Mother   . Heart disease Father   . Kidney disease Father   . Cancer Neg Hx   . Diabetes Neg Hx     Past Surgical History:  Procedure Laterality Date  . ANKLE FRACTURE SURGERY Left 1998  . CATARACT EXTRACTION W/ INTRAOCULAR LENS  IMPLANT, BILATERAL Bilateral   . FRACTURE SURGERY    . IM NAILING TIBIA Left 10/10/2017  . TIBIA IM NAIL INSERTION Left 10/10/2017   Procedure: INTRAMEDULLARY (IM) NAIL TIBIAL;  Surgeon: Leandrew Koyanagi, MD;  Location: [facility];  Service: Orthopedics;  Laterality: Left;  . WRIST FRACTURE SURGERY Right 2005    Social History   Occupational History  . 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 Smoking status: Never Smoker  . Smokeless tobacco: Never Used  Substance and Sexual Activity  . Alcohol use: Never    Frequency: Never  . Drug use: Never  . Sexual activity: Not Currently

## 2017-10-25 ENCOUNTER — Encounter: Payer: Self-pay | Admitting: Urology

## 2017-10-25 ENCOUNTER — Ambulatory Visit (INDEPENDENT_AMBULATORY_CARE_PROVIDER_SITE_OTHER): Payer: Medicare Other | Admitting: Urology

## 2017-10-25 VITALS — BP 127/77 | HR 80 | Ht 66.0 in | Wt 119.0 lb

## 2017-10-25 DIAGNOSIS — R350 Frequency of micturition: Secondary | ICD-10-CM | POA: Diagnosis not present

## 2017-10-25 MED ORDER — TRIMETHOPRIM 100 MG PO TABS: 100.0000 mg | ORAL_TABLET | Freq: Every day | ORAL | 11 refills | Status: DC

## 2017-10-25 NOTE — Addendum Note (Signed)
Addended by: Tommy Rainwater on: 10/25/2017 02:32 PM   Modules accepted: Orders

## 2017-10-25 NOTE — Progress Notes (Signed)
10/25/2017 2:05 PM   [name] May 19, 1926 [id number]  Referring provider: Baxter Hire, MD [place], [place] [address]  Chief Complaint  Patient presents with  . Urinary Frequency    New Patient    HPI: I was consulted by the above provider to assess the patient's probable recurrent bladder infections.  Some of the details of the history were difficult to ascertain but I think she is been treated with an antibiotic a number times in the last several months.  The primary symptom she is infected is frequency  In between infection she voids every 3 hours and is no nocturia.  She is continent  She has had a stroke.  She now has a cast on her leg for the next 4 weeks.  Normally she is ambulatory.  She has not had a hysterectomy.  She denies a history of previous GU surgery and kidney stones.  She does not think she is infected today  Modifying factors: There are no other modifying factors  Associated signs and symptoms: There are no other associated signs and symptoms Aggravating and relieving factors: There are no other aggravating or relieving factors Severity: Moderate Duration: Persistent   PMH: Past Medical History:  Diagnosis Date  . Anxiety   . Arthritis    "may have a touch; all over I guess" (10/11/2017)  . Back pain    "all my back" (10/11/2017)  . Basal cell carcinoma    "have had many taken off; burned" (10/11/2017)  . Chronic orthostatic hypotension June 2012   with pprior syncopal event,  did not tolelrate florinef trial  . Constipation 08/12/2013  . Depression   . Hyperlipidemia   . Hypertension   . Urinary frequency     Surgical History: Past Surgical History:  Procedure Laterality Date  . ANKLE FRACTURE SURGERY Left 1998  . CATARACT EXTRACTION W/ INTRAOCULAR LENS  IMPLANT, BILATERAL Bilateral   . FRACTURE SURGERY    . IM NAILING TIBIA Left 10/10/2017  . TIBIA IM NAIL INSERTION Left 10/10/2017   Procedure: INTRAMEDULLARY  (IM) NAIL TIBIAL;  Surgeon: Leandrew Koyanagi, MD;  Location: [facility];  Service: Orthopedics;  Laterality: Left;  . WRIST FRACTURE SURGERY Right 2005    Home Medications:  Allergies as of 10/25/2017   No Known Allergies     Medication List        Accurate as of 10/25/17  2:05 PM. Always use your most recent med list.          amLODipine 5 MG tablet Commonly known as:  NORVASC Take 5 mg by mouth daily.   Cholecalciferol 4000 units Caps Take 1 capsule by mouth daily.   diazepam 5 MG tablet Commonly known as:  VALIUM Take 1 tablet (5 mg total) by mouth at bedtime as needed for muscle spasms.   Garlic Oil 6283 MG Caps Take 1 capsule by mouth daily.   GERITOL PO Take 1 tablet by mouth daily.   HYDROcodone-acetaminophen 5-325 MG tablet Commonly known as:  NORCO/VICODIN Take 1 tablet by mouth every 8 (eight) hours as needed for moderate pain.   hydrocortisone cream 1 % Apply 1 application topically 2 (two) times daily. Apply to rash on back until resolved   hydrOXYzine 25 MG tablet Commonly known as:  ATARAX/VISTARIL TAKE ONE CAPSULE BY MOUTH THREE TIMES DAILY AS NEEDED   lactulose 10 GM/15ML solution Commonly known as:  CHRONULAC TAKE 30MLS(0NE OUNCE) BY MOUTH 2 TIMES DAILY AS NEEDED FOR  MILD CONSTIPATION. DO NOT USE MORE THAN ONCE A WEEK   Lutein 20 MG Caps Take 20 mg by mouth daily.   NON FORMULARY Diet type: NAS   propranolol 60 MG tablet Commonly known as:  INDERAL Take 60 mg by mouth at bedtime.   rivaroxaban 10 MG Tabs tablet Commonly known as:  XARELTO Take 10 mg by mouth daily.   sennosides-docusate sodium 8.6-50 MG tablet Commonly known as:  SENOKOT-S Take 2 tablets by mouth 2 (two) times daily.   VITAMIN E PO Take 400 Units by mouth daily.       Allergies: No Known Allergies  Family History: Family History  Problem Relation Age of Onset  . Colon cancer Mother   . Heart disease Father   . Kidney disease Father   . Cancer Neg Hx   .  Diabetes Neg Hx     Social History:  reports that she has never smoked. She has never used smokeless tobacco. She reports that she does not drink alcohol or use drugs.  ROS: UROLOGY Frequent Urination?: Yes Hard to postpone urination?: Yes Burning/pain with urination?: No Get up at night to urinate?: Yes Leakage of urine?: No Urine stream starts and stops?: No Trouble starting stream?: No Do you have to strain to urinate?: No Blood in urine?: No Urinary tract infection?: No Sexually transmitted disease?: No Injury to kidneys or bladder?: No Painful intercourse?: No Weak stream?: No Currently pregnant?: No Vaginal bleeding?: No Last menstrual period?: n  Gastrointestinal Nausea?: No Vomiting?: No Indigestion/heartburn?: No Diarrhea?: No Constipation?: Yes  Constitutional Fever: No Night sweats?: No Weight loss?: No Fatigue?: No  Skin Skin rash/lesions?: No Itching?: No  Eyes Blurred vision?: No Double vision?: No  Ears/Nose/Throat Sore throat?: No Sinus problems?: No  Hematologic/Lymphatic Swollen glands?: No Easy bruising?: No  Cardiovascular Leg swelling?: No Chest pain?: No  Respiratory Cough?: No Shortness of breath?: No  Endocrine Excessive thirst?: No  Musculoskeletal Back pain?: Yes Joint pain?: No  Neurological Headaches?: No Dizziness?: No  Psychologic Depression?: No Anxiety?: Yes  Physical Exam: BP 127/77   Pulse 80   Ht 5\' 6"  (1.676 m)   Wt 54 kg   BMI 19.21 kg/m   Constitutional:  Alert and oriented, No acute distress. HEENT: [place] AT, moist mucus membranes.  Trachea midline, no masses. Cardiovascular: No clubbing, cyanosis, or edema. Respiratory: Normal respiratory effort, no increased work of breathing. GI: Abdomen is soft, nontender, nondistended, no abdominal masses GU: No CVA tenderness.  Sitting comfortably in wheelchair with cast on left leg Skin: No rashes, bruises or suspicious lesions. Lymph: No cervical or  inguinal adenopathy. Neurologic: Grossly intact, no focal deficits, moving all 4 extremities. Psychiatric: Normal mood and affect.  Laboratory Data: Lab Results  Component Value Date   WBC 7.0 10/22/2017   HGB 10.9 (L) 10/22/2017   HCT 33.6 (L) 10/22/2017   MCV 102.1 (H) 10/22/2017   PLT 468 (H) 10/22/2017    Lab Results  Component Value Date   CREATININE 0.69 10/11/2017    No results found for: PSA  No results found for: TESTOSTERONE  No results found for: HGBA1C  Urinalysis    Component Value Date/Time   COLORURINE STRAW (A) 10/18/2017 [place] (A) 10/18/2017 1720   LABSPEC 1.003 (L) 10/18/2017 1720   PHURINE 7.0 10/18/2017 1720   GLUCOSEU NEGATIVE 10/18/2017 1720   GLUCOSEU NEGATIVE 11/28/2014 1341   HGBUR NEGATIVE 10/18/2017 1720   BILIRUBINUR NEGATIVE 10/18/2017 1720   BILIRUBINUR  neg 12/16/2016 1448   KETONESUR NEGATIVE 10/18/2017 1720   PROTEINUR NEGATIVE 10/18/2017 1720   UROBILINOGEN 0.2 12/16/2016 1448   UROBILINOGEN 0.2 11/28/2014 1341   NITRITE NEGATIVE 10/18/2017 1720   LEUKOCYTESUR NEGATIVE 10/18/2017 1720    Pertinent Imaging: None  Assessment & Plan: Work-up and pathophysiology discussed.  Recommended renal ultrasound and call if abnormal.  Reassess in 6 weeks on daily trimethoprim.  1. Urinary frequency  - Urinalysis, Complete   No follow-ups on file.  Reece Packer, MD  [facility], [facility] [address] [place], [place] [address] [phone]

## 2017-10-29 ENCOUNTER — Ambulatory Visit
Admission: RE | Admit: 2017-10-29 | Discharge: 2017-10-29 | Disposition: A | Discharge status: Home / Self Care | Payer: Medicare Other | Source: Ambulatory Visit | Attending: Urology | Admitting: Urology

## 2017-10-29 DIAGNOSIS — N390 Urinary tract infection, site not specified: Secondary | ICD-10-CM | POA: Diagnosis not present

## 2017-10-29 DIAGNOSIS — R350 Frequency of micturition: Secondary | ICD-10-CM

## 2017-11-01 ENCOUNTER — Telehealth (INDEPENDENT_AMBULATORY_CARE_PROVIDER_SITE_OTHER): Payer: Self-pay | Admitting: Orthopaedic Surgery

## 2017-11-01 NOTE — Telephone Encounter (Signed)
See message below  Please advise

## 2017-11-01 NOTE — Telephone Encounter (Signed)
Patients niece, Anne Ng called to see if Dr. Erlinda Hong could get an appeal started for patient to stay at [facility] until she can bear weight on her left leg. She received a call from a social worker saying that the patient has not been approved to stay any longer past Friday. The insurance company [employer]) told her Dr. Erlinda Hong could start an appeal by contacting assisted living to say she needs to stay where she is. [facility] # [phone]  (She did not give me [facility] number)

## 2017-11-03 ENCOUNTER — Telehealth (INDEPENDENT_AMBULATORY_CARE_PROVIDER_SITE_OTHER): Payer: Self-pay | Admitting: Orthopaedic Surgery

## 2017-11-03 NOTE — Telephone Encounter (Signed)
Patient niece [phone] called stating that she needs someone here at [facility] office to start process. Someone can call Terisa Starr) and advise her patient needs to stay there until she see Erlinda Hong on 11/15 and say she can have weight bearing on that leg.  Patient has no family at home to assist her at this time. [facility] # [phone]  They would need this call to be made as soon as possible.

## 2017-11-03 NOTE — Telephone Encounter (Signed)
I called [name] and NA, will try again.

## 2017-11-03 NOTE — Telephone Encounter (Signed)
See message.

## 2017-11-03 NOTE — Telephone Encounter (Signed)
Abigail Butts, this was sent to Mendel Ryder she has been out of the office.

## 2017-11-04 NOTE — Telephone Encounter (Signed)
IC and s/w [facility] and they have patient staying there until next Tuesday, they are discussing assisted living vs .facilities.  Family is deciding what to do. It is an Copy issue. Niece is aware. No further action needed

## 2017-11-04 NOTE — Telephone Encounter (Signed)
Ok to call assisted living and tell them it is ok to stay there

## 2017-11-08 ENCOUNTER — Other Ambulatory Visit: Payer: Self-pay | Admitting: Adult Health

## 2017-11-08 ENCOUNTER — Non-Acute Institutional Stay (SKILLED_NURSING_FACILITY): Payer: Medicare Other | Admitting: Adult Health

## 2017-11-08 ENCOUNTER — Other Ambulatory Visit: Payer: Self-pay

## 2017-11-08 ENCOUNTER — Encounter: Payer: Self-pay | Admitting: Adult Health

## 2017-11-08 DIAGNOSIS — S82402D Unspecified fracture of shaft of left fibula, subsequent encounter for closed fracture with routine healing: Secondary | ICD-10-CM

## 2017-11-08 DIAGNOSIS — S82242S Displaced spiral fracture of shaft of left tibia, sequela: Secondary | ICD-10-CM | POA: Diagnosis not present

## 2017-11-08 DIAGNOSIS — M533 Sacrococcygeal disorders, not elsewhere classified: Secondary | ICD-10-CM

## 2017-11-08 DIAGNOSIS — G250 Essential tremor: Secondary | ICD-10-CM

## 2017-11-08 MED ORDER — LACTULOSE 10 GM/15ML PO SOLN: ORAL | 0 refills | Status: DC

## 2017-11-08 MED ORDER — HYDROCODONE-ACETAMINOPHEN 5-325 MG PO TABS: 1.0000 | ORAL_TABLET | Freq: Three times a day (TID) | ORAL | 0 refills | Status: DC | PRN

## 2017-11-08 MED ORDER — PROPRANOLOL HCL 60 MG PO TABS: 60.0000 mg | ORAL_TABLET | Freq: Every day | ORAL | 0 refills | Status: DC

## 2017-11-08 MED ORDER — HYDROXYZINE HCL 25 MG PO TABS: 25.0000 mg | ORAL_TABLET | Freq: Three times a day (TID) | ORAL | 0 refills | Status: DC | PRN

## 2017-11-08 MED ORDER — AMLODIPINE BESYLATE 5 MG PO TABS: 5.0000 mg | ORAL_TABLET | Freq: Every day | ORAL | 0 refills | Status: DC

## 2017-11-08 MED ORDER — TRIMETHOPRIM 100 MG PO TABS: 100.0000 mg | ORAL_TABLET | Freq: Every day | ORAL | 0 refills | Status: DC

## 2017-11-08 NOTE — Progress Notes (Signed)
Location:   [facility] Room Number: 207 A Place of Service:  SNF (31)    CODE STATUS: Full Code  No Known Allergies  Chief Complaint  Patient presents with  . Discharge Note    Discharging to home on 11/09/17    HPI:  She is being discharged to home with home health for pt/ot/rn. She will not need dme; she has all necessary equipment at home. She will need her prescriptions written and will need to follow up with her medical provider. She had been hospitalized for a left tib/fib fracture. She was admitted to this facility for short term rehab and is ready to return back home.    Past Medical History:  Diagnosis Date  . Anxiety   . Arthritis    "may have a touch; all over I guess" (10/11/2017)  . Back pain    "all my back" (10/11/2017)  . Basal cell carcinoma    "have had many taken off; burned" (10/11/2017)  . Chronic orthostatic hypotension June 2012   with pprior syncopal event,  did not tolelrate florinef trial  . Constipation 08/12/2013  . Depression   . Hyperlipidemia   . Hypertension   . Urinary frequency     Past Surgical History:  Procedure Laterality Date  . ANKLE FRACTURE SURGERY Left 1998  . CATARACT EXTRACTION W/ INTRAOCULAR LENS  IMPLANT, BILATERAL Bilateral   . FRACTURE SURGERY    . IM NAILING TIBIA Left 10/10/2017  . TIBIA IM NAIL INSERTION Left 10/10/2017   Procedure: INTRAMEDULLARY (IM) NAIL TIBIAL;  Surgeon: Leandrew Koyanagi, MD;  Location: [facility];  Service: Orthopedics;  Laterality: Left;  . WRIST FRACTURE SURGERY Right 2005    Social History   Socioeconomic History  . Marital status: Widowed    Spouse name: Not on file  . Number of children: Not on file  . Years of education: Not on file  . Highest education level: Not on file  Occupational History  . 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 Financial resource strain: Not on file  . Food insecurity:    Worry:  Not on file    Inability: Not on file  . Transportation needs:    Medical: Not on file    Non-medical: Not on file  Tobacco Use  . Smoking status: Never Smoker  . Smokeless tobacco: Never Used  Substance and Sexual Activity  . Alcohol use: Never    Frequency: Never  . Drug use: Never  . Sexual activity: Not Currently  Lifestyle  . Physical activity:    Days per week: Not on file    Minutes per session: Not on file  . Stress: Not on file  Relationships  . Social connections:    Talks on phone: Not on file    Gets together: Not on file    Attends religious service: Not on file    Active member of club or organization: Not on file    Attends meetings of clubs or organizations: Not on file    Relationship status: Not on file  . Intimate partner violence:    Fear of current or ex partner: Not on file    Emotionally abused: Not on file    Physically abused: Not on file    Forced sexual activity: Not on file  Other Topics Concern  . Not on file  Social History Narrative  . Not on  file   Family History  Problem Relation Age of Onset  . Colon cancer Mother   . Heart disease Father   . Kidney disease Father   . Cancer Neg Hx   . Diabetes Neg Hx     VITAL SIGNS BP (!) 143/44   Pulse 75   Temp 97.9 F (36.6 C)   Resp 17   Ht 5\' 6"  (1.676 m)   Wt 124 lb 14.4 oz (56.7 kg)   SpO2 96%   BMI 20.16 kg/m   Patient's Medications  New Prescriptions   No medications on file  Previous Medications   AMLODIPINE (NORVASC) 5 MG TABLET    Take 5 mg by mouth daily.   CHOLECALCIFEROL 4000 UNITS CAPS    Take 1 capsule by mouth daily.   GARLIC OIL 4854 MG CAPS    Take 1 capsule by mouth daily.    HYDROCODONE-ACETAMINOPHEN (NORCO/VICODIN) 5-325 MG TABLET    Take 1 tablet by mouth every 8 (eight) hours as needed for moderate pain.   HYDROXYZINE (ATARAX/VISTARIL) 25 MG TABLET    TAKE ONE CAPSULE BY MOUTH THREE TIMES DAILY AS NEEDED   IRON-VITAMINS (GERITOL PO)    Take 1 tablet by mouth  daily.   LACTULOSE (CHRONULAC) 10 GM/15ML SOLUTION    TAKE 30MLS(0NE OUNCE) BY MOUTH 2 TIMES DAILY AS NEEDED FOR MILD CONSTIPATION. DO NOT USE MORE THAN ONCE A WEEK   LUTEIN 20 MG CAPS    Take 20 mg by mouth daily.     NON FORMULARY    Diet type: NAS   PROPRANOLOL (INDERAL) 60 MG TABLET    Take 60 mg by mouth at bedtime.   SENNOSIDES-DOCUSATE SODIUM (SENOKOT-S) 8.6-50 MG TABLET    Take 2 tablets by mouth 2 (two) times daily.   TRIMETHOPRIM (TRIMPEX) 100 MG TABLET    Take 1 tablet (100 mg total) by mouth daily.   VITAMIN E PO    Take 400 Units by mouth daily.   Modified Medications   No medications on file  Discontinued Medications   DIAZEPAM (VALIUM) 5 MG TABLET    Take 1 tablet (5 mg total) by mouth at bedtime as needed for muscle spasms.   HYDROCORTISONE CREAM 1 %    Apply 1 application topically 2 (two) times daily. Apply to rash on back until resolved   RIVAROXABAN (XARELTO) 10 MG TABS TABLET    Take 10 mg by mouth daily.     SIGNIFICANT DIAGNOSTIC EXAMS  PREVIOUS:   10-06-17: Left tibia/fibula x-ray: Acute fractures of the tibia and fibulaChronic healed fracture of the medial and lateral malleolus.  NO NEW EXAMS.    LABS REVIEWED PREVIOUS:   10-06-17: wbc 8.8; hgb 13.2; hct 37.3; mcv 100.7; plt 270; glucose 98; bun 13; creat 0.79; k+ 4.6; na++ 135; ca 9.2 liver normal albumin 4.3  10-11-17: wbc 12.9; hgb 10.7; hct 31.9; mcv 100.0; plt 246; glucose 110; bun 12; creat 0.69; k+ 4.3 ;na++ 136; ca 8.7 10-18-17: urine culture: no growth  NO NEW LABS.      Review of Systems  Constitutional: Negative for malaise/fatigue.  Respiratory: Negative for cough and shortness of breath.   Cardiovascular: Negative for chest pain, palpitations and leg swelling.  Gastrointestinal: Negative for abdominal pain, constipation and heartburn.  Musculoskeletal: Negative for back pain, joint pain and myalgias.  Skin: Negative.   Neurological: Negative for dizziness.  Psychiatric/Behavioral: The  patient is not nervous/anxious.     Physical Exam  Constitutional: She is  oriented to person, place, and time. No distress.  Frail   Neck: No thyromegaly present.  Cardiovascular: Normal rate, regular rhythm, normal heart sounds and intact distal pulses.  Pulmonary/Chest: Effort normal and breath sounds normal. No respiratory distress.  Abdominal: Soft. Bowel sounds are normal. She exhibits no distension. There is no tenderness.  Musculoskeletal: She exhibits no edema.  Is able to move all extremities Is status post left tibia/fibula fracture: has boot on is able to wiggle toes has rapid capillary refill    Has bilateral hand and head tremor   Lymphadenopathy:    She has no cervical adenopathy.  Neurological: She is alert and oriented to person, place, and time.  Skin: Skin is warm and dry. She is not diaphoretic.  Psychiatric: She has a normal mood and affect.      ASSESSMENT/ PLAN:  Patient is being discharged with the following home health services:  Pt/ot/rn: to evaluate and treat as indicated for gait balance strength adl training med management.   Patient is being discharged with the following durable medical equipment:  None needed.   Patient has been advised to f/u with their PCP in 1-2 weeks to bring them up to date on their rehab stay.  Social services at facility was responsible for arranging this appointment.  Pt was provided with a 30 day supply of prescriptions for medications and refills must be obtained from their PCP.  For controlled substances, a more limited supply may be provided adequate until PCP appointment only.   A 30 day supply of her prescription medications have been sent electronically to [facility]  #15 vicodin 5.325 mg tabs   Time spent with patient: 35 minutes: discussed home health dme and medications; verbalized understanding.   Ok Edwards NP [facility]  Contact [phone] Monday through Friday 8am- 5pm  After hours  call [phone]

## 2017-11-15 ENCOUNTER — Telehealth (INDEPENDENT_AMBULATORY_CARE_PROVIDER_SITE_OTHER): Payer: Self-pay | Admitting: Orthopaedic Surgery

## 2017-11-15 NOTE — Telephone Encounter (Signed)
Ok on orders.  

## 2017-11-15 NOTE — Telephone Encounter (Signed)
Merry Proud from [facility] is requesting verbal order for PT for 1 time a week for 4 weeks.  Merry Proud # [phone]

## 2017-11-15 NOTE — Telephone Encounter (Signed)
Called Merry Proud back to approve orders, no answer LMOM ok on orders per Dr Erlinda Hong.

## 2017-11-15 NOTE — Telephone Encounter (Signed)
yes

## 2017-11-19 ENCOUNTER — Ambulatory Visit (INDEPENDENT_AMBULATORY_CARE_PROVIDER_SITE_OTHER): Payer: Medicare Other | Admitting: Orthopaedic Surgery

## 2017-11-19 ENCOUNTER — Ambulatory Visit (INDEPENDENT_AMBULATORY_CARE_PROVIDER_SITE_OTHER): Payer: Medicare Other

## 2017-11-19 ENCOUNTER — Encounter (INDEPENDENT_AMBULATORY_CARE_PROVIDER_SITE_OTHER): Payer: Self-pay | Admitting: Orthopaedic Surgery

## 2017-11-19 VITALS — Ht 66.0 in | Wt 124.0 lb

## 2017-11-19 DIAGNOSIS — S82402S Unspecified fracture of shaft of left fibula, sequela: Secondary | ICD-10-CM | POA: Diagnosis not present

## 2017-11-19 DIAGNOSIS — S82202S Unspecified fracture of shaft of left tibia, sequela: Secondary | ICD-10-CM | POA: Diagnosis not present

## 2017-11-19 NOTE — Progress Notes (Signed)
Post-Op Visit Note   Patient: [name]           Date of Birth: [birth year]           MRN: [id number] Visit Date: 11/19/2017 PCP: Baxter Hire, MD   Assessment & Plan:  Chief Complaint:  Chief Complaint  Patient presents with  . Left Leg - Routine Post Op    10/10/17 left IM nail tib/fib   Visit Diagnoses:  1. Closed fracture of left tibia and fibula, sequela     Plan: [name] is 6 weeks status post intramedullary fixation of a left tibia fracture.  Overall she is feeling well.  She is now living at home.  She has been nonweightbearing.  She does not have any real complaints other than her cam walker feels too heavy.  Her surgical scars are fully healed.  No neurovascular compromise to the left lower extremity.  Her x-rays demonstrate stable fixation but minimal callus formation.  I discussed that we need to watch this closely.  At this point we will going to allow her to weight-bear as tolerated in a cam boot.  I would like to recheck her in 6 weeks with 2 view x-rays of the left tib-fib.  Follow-Up Instructions: Return in about 6 weeks (around 12/31/2017).   Orders:  Orders Placed This Encounter  Procedures  . XR Tibia/Fibula Left   No orders of the defined types were placed in this encounter.   Imaging: Xr Tibia/fibula Left  Result Date: 11/19/2017 Stable alignment and fixation of tibia fracture.  Generalized osteopenia.  Minimal callus formation.   PMFS History: Patient Active Problem List   Diagnosis Date Noted  . Closed fracture of shaft of left fibula with routine healing   . Closed displaced spiral fracture of shaft of left tibia 10/09/2017  . Tibia/fibula fracture 10/09/2017  . 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 Iliac bone pain 07/29/2015  . Osteoporosis 03/13/2015  . Raynaud phenomenon 02/24/2015  . Hyponatremia 02/09/2015  . Urinary frequency 01/30/2015  . Medicare annual wellness visit, subsequent 01/06/2015  .  Encounter for preventive health examination 01/06/2015  . Underweight 06/15/2014  . Chronic left hip pain 06/13/2014  . Benign essential tremor 10/02/2013  . Constipation 08/12/2013  . Urinary incontinence, urge 05/29/2012  . Nocturnal leg cramps 12/15/2011  . Screening for breast cancer 10/27/2011  . Degeneration of lumbar or lumbosacral intervertebral disc 09/01/2011  . Insomnia 01/07/2011  . Hyperlipidemia LDL goal <130 11/08/2010  . Back pain, sacroiliac 11/08/2010  . Anxiety   . Essential hypertension 09/18/2010   Past Medical History:  Diagnosis Date  . Anxiety   . Arthritis    "may have a touch; all over I guess" (10/11/2017)  . Back pain    "all my back" (10/11/2017)  . Basal cell carcinoma    "have had many taken off; burned" (10/11/2017)  . Chronic orthostatic hypotension June 2012   with pprior syncopal event,  did not tolelrate florinef trial  . Constipation 08/12/2013  . Depression   . Hyperlipidemia   . Hypertension   . Urinary frequency     Family History  Problem Relation Age of Onset  . Colon cancer Mother   . Heart disease Father   . Kidney disease Father   . Cancer Neg Hx   . Diabetes Neg Hx     Past Surgical History:  Procedure Laterality Date  . ANKLE FRACTURE SURGERY Left 1998  . CATARACT  EXTRACTION W/ INTRAOCULAR LENS  IMPLANT, BILATERAL Bilateral   . FRACTURE SURGERY    . IM NAILING TIBIA Left 10/10/2017  . TIBIA IM NAIL INSERTION Left 10/10/2017   Procedure: INTRAMEDULLARY (IM) NAIL TIBIAL;  Surgeon: Leandrew Koyanagi, MD;  Location: [place];  Service: Orthopedics;  Laterality: Left;  . WRIST FRACTURE SURGERY Right 2005   Social History   Occupational History  . 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 Smoking status: Never Smoker  . Smokeless tobacco: Never Used  Substance and Sexual Activity  . Alcohol use: Never    Frequency: Never  . Drug use: Never  . Sexual activity: Not Currently

## 2017-12-04 ENCOUNTER — Ambulatory Visit (INDEPENDENT_AMBULATORY_CARE_PROVIDER_SITE_OTHER): Payer: Medicare Other

## 2017-12-04 ENCOUNTER — Other Ambulatory Visit: Payer: Self-pay

## 2017-12-04 ENCOUNTER — Ambulatory Visit
Admission: EM | Admit: 2017-12-04 | Discharge: 2017-12-04 | Disposition: A | Discharge status: Home / Self Care | Payer: Medicare Other | Attending: Family Medicine | Admitting: Family Medicine

## 2017-12-04 DIAGNOSIS — S82255D Nondisplaced comminuted fracture of shaft of left tibia, subsequent encounter for closed fracture with routine healing: Secondary | ICD-10-CM | POA: Diagnosis not present

## 2017-12-04 DIAGNOSIS — X58XXXD Exposure to other specified factors, subsequent encounter: Secondary | ICD-10-CM | POA: Diagnosis not present

## 2017-12-04 DIAGNOSIS — L03116 Cellulitis of left lower limb: Secondary | ICD-10-CM

## 2017-12-04 MED ORDER — CEPHALEXIN 500 MG PO CAPS: 500.0000 mg | ORAL_CAPSULE | Freq: Three times a day (TID) | ORAL | 0 refills | Status: DC

## 2017-12-04 NOTE — ED Triage Notes (Addendum)
Pt with tib/fib fx early October with surgery. Started weight bearing about a week ago and wearing a boot. Last Saturday noticed swelling to left ankle and foot but no pain.

## 2017-12-04 NOTE — ED Provider Notes (Signed)
[facility]    CSN: [id number] Arrival date & time: 12/04/17  1740     History   Chief Complaint Chief Complaint  Patient presents with  . Leg Swelling    HPI [name] is a 84 y.o. female.   84 yo female s/p ORIF left tibia in October presents with a c/o 5 days of redness and swelling to the lower leg. Denies any fevers, chills, or drainage. Denies any new injuries. States she's getting PT and last week started walking on it more.   The history is provided by the patient.    Past Medical History:  Diagnosis Date  . Anxiety   . Arthritis    "may have a touch; all over I guess" (10/11/2017)  . Back pain    "all my back" (10/11/2017)  . Basal cell carcinoma    "have had many taken off; burned" (10/11/2017)  . Chronic orthostatic hypotension June 2012   with pprior syncopal event,  did not tolelrate florinef trial  . Constipation 08/12/2013  . Depression   . Hyperlipidemia   . Hypertension   . Urinary frequency     Patient Active Problem List   Diagnosis Date Noted  . Closed fracture of shaft of left fibula with routine healing   . Closed displaced spiral fracture of shaft of left tibia 10/09/2017  . Tibia/fibula fracture 10/09/2017  . 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 Iliac bone pain 07/29/2015  . Osteoporosis 03/13/2015  . Raynaud phenomenon 02/24/2015  . Hyponatremia 02/09/2015  . Urinary frequency 01/30/2015  . Medicare annual wellness visit, subsequent 01/06/2015  . Encounter for preventive health examination 01/06/2015  . Underweight 06/15/2014  . Chronic left hip pain 06/13/2014  . Benign essential tremor 10/02/2013  . Constipation 08/12/2013  . Urinary incontinence, urge 05/29/2012  . Nocturnal leg cramps 12/15/2011  . Screening for breast cancer 10/27/2011  . Degeneration of lumbar or lumbosacral intervertebral disc 09/01/2011  . Insomnia 01/07/2011  . Hyperlipidemia LDL goal <130 11/08/2010  . Back pain,  sacroiliac 11/08/2010  . Anxiety   . Essential hypertension 09/18/2010    Past Surgical History:  Procedure Laterality Date  . ANKLE FRACTURE SURGERY Left 1998  . CATARACT EXTRACTION W/ INTRAOCULAR LENS  IMPLANT, BILATERAL Bilateral   . FRACTURE SURGERY    . IM NAILING TIBIA Left 10/10/2017  . TIBIA IM NAIL INSERTION Left 10/10/2017   Procedure: INTRAMEDULLARY (IM) NAIL TIBIAL;  Surgeon: Leandrew Koyanagi, MD;  Location: [place];  Service: Orthopedics;  Laterality: Left;  . WRIST FRACTURE SURGERY Right 2005    OB History   None      Home Medications    Prior to Admission medications   Medication Sig Start Date End Date Taking? Authorizing Provider  amLODipine (NORVASC) 5 MG tablet Take 1 tablet (5 mg total) by mouth daily. 11/08/17   Gerlene Fee, NP  cephALEXin (KEFLEX) 500 MG capsule Take 1 capsule (500 mg total) by mouth 3 (three) times daily. 12/04/17   Norval Gable, MD  Cholecalciferol 4000 units CAPS Take 1 capsule by mouth daily. 10/14/17   [provider]  Garlic Oil 8144 MG CAPS Take 1 capsule by mouth daily.     [provider]  HYDROcodone-acetaminophen (NORCO/VICODIN) 5-325 MG tablet Take 1 tablet by mouth every 8 (eight) hours as needed for moderate pain. 11/08/17   Gerlene Fee, NP  hydrOXYzine (ATARAX/VISTARIL) 25 MG tablet Take 1 tablet (25 mg total) by  mouth 3 (three) times daily as needed. 11/08/17   Gerlene Fee, NP  Iron-Vitamins (GERITOL PO) Take 1 tablet by mouth daily.    [provider]  lactulose (CHRONULAC) 10 GM/15ML solution TAKE 30MLS(0NE OUNCE) BY MOUTH 2 TIMES DAILY AS NEEDED FOR MILD CONSTIPATION. DO NOT USE MORE THAN ONCE A WEEK 11/08/17   Gerlene Fee, NP  Lutein 20 MG CAPS Take 20 mg by mouth daily.      [provider]  NON FORMULARY Diet type: NAS    [provider]  propranolol (INDERAL) 60 MG tablet Take 1 tablet (60 mg total) by mouth at bedtime. 11/08/17   Gerlene Fee, NP    sennosides-docusate sodium (SENOKOT-S) 8.6-50 MG tablet Take 2 tablets by mouth 2 (two) times daily. 10/14/17   [provider]  trimethoprim (TRIMPEX) 100 MG tablet Take 1 tablet (100 mg total) by mouth daily. 11/08/17   Gerlene Fee, NP  VITAMIN E PO Take 400 Units by mouth daily.     [provider]    Family History Family History  Problem Relation Age of Onset  . Colon cancer Mother   . Heart disease Father   . Kidney disease Father   . Cancer Neg Hx   . Diabetes Neg Hx     Social History Social History   Tobacco Use  . Smoking status: Never Smoker  . Smokeless tobacco: Never Used  Substance Use Topics  . Alcohol use: Never    Frequency: Never  . Drug use: Never     Allergies   Patient has no known allergies.   Review of Systems Review of Systems   Physical Exam Triage Vital Signs ED Triage Vitals  Enc Vitals Group     BP 12/04/17 1013 (!) 186/73     Pulse Rate 12/04/17 1013 62     Resp 12/04/17 1013 16     Temp 12/04/17 1013 (!) 97.5 F (36.4 C)     Temp Source 12/04/17 1013 Oral     SpO2 12/04/17 1013 100 %     Weight 12/04/17 1014 125 lb (56.7 kg)     Height 12/04/17 1014 5\' 6"  (1.676 m)     Head Circumference --      Peak Flow --      Pain Score 12/04/17 1014 0     Pain Loc --      Pain Edu? --      Excl. in [place]? --    No data found.  Updated Vital Signs BP (!) 186/73 (BP Location: Right Arm)   Pulse 62   Temp (!) 97.5 F (36.4 C) (Oral)   Resp 16   Ht 5\' 6"  (1.676 m)   Wt 56.7 kg   SpO2 100%   BMI 20.18 kg/m   Visual Acuity Right Eye Distance:   Left Eye Distance:   Bilateral Distance:    Right Eye Near:   Left Eye Near:    Bilateral Near:     Physical Exam  Constitutional: She appears well-developed and well-nourished. No distress.  Musculoskeletal:       Left lower leg: She exhibits edema (mild; with mild tenderness, warmth and blanchable erythema at the lower leg (just above the ankle); no drainage).   Skin: She is not diaphoretic.  Nursing note and vitals reviewed.    UC Treatments / Results  Labs (all labs ordered are listed, but only abnormal results are displayed) Labs Reviewed - No data to  display  EKG None  Radiology Dg Tibia/fibula Left  Result Date: 12/04/2017 CLINICAL DATA:  Recent left ankle fracture status post ORIF. Acute swelling. EXAM: LEFT TIBIA AND FIBULA - 2 VIEW COMPARISON:  10/22/2017 FINDINGS: Left tibia intramedullary rod as well as ORIF of the bimalleolar ankle fractures again noted. There is callus formation about the distal tibia fracture as well as the proximal fibula fracture. Stable alignment and hardware. Mild left lower extremity subcutaneous edema suspected. Bones are osteopenic. IMPRESSION: Stable alignment and hardware following ORIF of the distal tibia fracture and the bimalleolar fractures. Healing proximal fibular shaft fracture as well. No new finding by plain radiography Electronically Signed   By: Jerilynn Mages.  Shick M.D.   On: 12/04/2017 11:00   Dg Ankle Complete Left  Result Date: 12/04/2017 CLINICAL DATA:  Hx of fx left ankle, with recent fx and surgery. Here today because of swelling, no new injury EXAM: LEFT ANKLE COMPLETE - 3+ VIEW COMPARISON:  11/19/2017 and previous FINDINGS: Tibial IM rod across oblique fracture of the distal tibial shaft, incompletely visualized proximally, with 2 distal interlocking screws, stable in appearance. 2 orthopedic screws transfix the medial malleolus in near anatomic alignment. Plate and screw fixation of the distal fibula in near anatomic alignment. Ankle mortise appears intact. There is diffuse osteopenia. No acute fracture or dislocation. IMPRESSION: 1. Posttraumatic and postop changes in the tibia and fibula as above. No acute findings. Electronically Signed   By: Lucrezia Europe M.D.   On: 12/04/2017 11:00    Procedures Procedures (including critical care time)  Medications Ordered in UC Medications - No data to  display  Initial Impression / Assessment and Plan / UC Course  I have reviewed the triage vital signs and the nursing notes.  Pertinent labs & imaging results that were available during my care of the patient were reviewed by me and considered in my medical decision making (see chart for details).      Final Clinical Impressions(s) / UC Diagnoses   Final diagnoses:  Cellulitis of leg, left    ED Prescriptions    Medication Sig Dispense Auth. Provider   cephALEXin (KEFLEX) 500 MG capsule Take 1 capsule (500 mg total) by mouth 3 (three) times daily. 30 capsule Norval Gable, MD      1. x-ray results and diagnosis reviewed with patient 2. rx as per orders above; reviewed possible side effects, interactions, risks and benefits  3. Recommend supportive treatment with elevation, warm compresses 4. Follow-up prn if symptoms worsen or don't improve   Controlled Substance Prescriptions [place] Controlled Substance Registry consulted? Not Applicable   Norval Gable, MD 12/04/17 1123

## 2017-12-06 ENCOUNTER — Ambulatory Visit (INDEPENDENT_AMBULATORY_CARE_PROVIDER_SITE_OTHER): Payer: Medicare Other | Admitting: Urology

## 2017-12-06 ENCOUNTER — Encounter: Payer: Self-pay | Admitting: Urology

## 2017-12-06 VITALS — BP 176/78 | HR 76 | Ht 66.0 in | Wt 122.5 lb

## 2017-12-06 DIAGNOSIS — R350 Frequency of micturition: Secondary | ICD-10-CM | POA: Diagnosis not present

## 2017-12-06 DIAGNOSIS — N3020 Other chronic cystitis without hematuria: Secondary | ICD-10-CM | POA: Diagnosis not present

## 2017-12-06 LAB — MICROSCOPIC EXAMINATION
RBC, UA: NONE SEEN /hpf (ref 0–2)
WBC, UA: NONE SEEN /hpf (ref 0–5)

## 2017-12-06 LAB — URINALYSIS, COMPLETE
Bilirubin, UA: NEGATIVE
Glucose, UA: NEGATIVE
Ketones, UA: NEGATIVE
Leukocytes, UA: NEGATIVE
Nitrite, UA: NEGATIVE
Protein, UA: NEGATIVE
RBC, UA: NEGATIVE
Specific Gravity, UA: 1.02 (ref 1.005–1.030)
Urobilinogen, Ur: 0.2 mg/dL (ref 0.2–1.0)
pH, UA: 7 (ref 5.0–7.5)

## 2017-12-06 NOTE — Progress Notes (Signed)
12/06/2017 3:31 PM   [name] 12/19/1926 [id number]  Referring provider: Baxter Hire, MD [place], [place] [address]  Chief Complaint  Patient presents with  . Urinary Frequency    HPI:  was consulted by the above provider to assess the patient's probable recurrent bladder infections.  Some of the details of the history were difficult to ascertain but I think she is been treated with an antibiotic a number times in the last several months.  The primary symptom she is infected is frequency  In between infection she voids every 3 hours and is no nocturia.  She is continent  She has had a stroke.  She now has a cast on her leg for the next 4 weeks.  Normally she is ambulatory.  She has not had a hysterectomy.   Work-up and pathophysiology discussed.  Recommended renal ultrasound and call if abnormal.  Reassess in 6 weeks on daily trimethoprim.  Today Frequency is stable.  Ultrasound negative.  Urine culture negative Clinically not infected today.  I had a circular conversation regarding taking medication.  It will ultimately have to be her choice.   PMH: Past Medical History:  Diagnosis Date  . Anxiety   . Arthritis    "may have a touch; all over I guess" (10/11/2017)  . Back pain    "all my back" (10/11/2017)  . Basal cell carcinoma    "have had many taken off; burned" (10/11/2017)  . Chronic orthostatic hypotension June 2012   with pprior syncopal event,  did not tolelrate florinef trial  . Constipation 08/12/2013  . Depression   . Hyperlipidemia   . Hypertension   . Urinary frequency     Surgical History: Past Surgical History:  Procedure Laterality Date  . ANKLE FRACTURE SURGERY Left 1998  . CATARACT EXTRACTION W/ INTRAOCULAR LENS  IMPLANT, BILATERAL Bilateral   . FRACTURE SURGERY    . IM NAILING TIBIA Left 10/10/2017  . TIBIA IM NAIL INSERTION Left 10/10/2017   Procedure: INTRAMEDULLARY (IM) NAIL TIBIAL;  Surgeon: Leandrew Koyanagi, MD;   Location: [place];  Service: Orthopedics;  Laterality: Left;  . WRIST FRACTURE SURGERY Right 2005    Home Medications:  Allergies as of 12/06/2017   No Known Allergies     Medication List        Accurate as of 12/06/17  3:31 PM. Always use your most recent med list.          amLODipine 5 MG tablet Commonly known as:  NORVASC Take 1 tablet (5 mg total) by mouth daily.   cephALEXin 500 MG capsule Commonly known as:  KEFLEX Take 1 capsule (500 mg total) by mouth 3 (three) times daily.   Cholecalciferol 100 MCG (4000 UT) Caps Take 1 capsule by mouth daily.   Garlic Oil 3500 MG Caps Take 1 capsule by mouth daily.   GERITOL PO Take 1 tablet by mouth daily.   lactulose 10 GM/15ML solution Commonly known as:  CHRONULAC TAKE 30MLS(0NE OUNCE) BY MOUTH 2 TIMES DAILY AS NEEDED FOR MILD CONSTIPATION. DO NOT USE MORE THAN ONCE A WEEK   Lutein 20 MG Caps Take 20 mg by mouth daily.   NON FORMULARY Diet type: NAS   propranolol 60 MG tablet Commonly known as:  INDERAL Take 1 tablet (60 mg total) by mouth at bedtime.   sennosides-docusate sodium 8.6-50 MG tablet Commonly known as:  SENOKOT-S Take 2 tablets by mouth 2 (two) times daily.  trimethoprim 100 MG tablet Commonly known as:  TRIMPEX Take 1 tablet (100 mg total) by mouth daily.   VITAMIN E PO Take 400 Units by mouth daily.       Allergies: No Known Allergies  Family History: Family History  Problem Relation Age of Onset  . Colon cancer Mother   . Heart disease Father   . Kidney disease Father   . Cancer Neg Hx   . Diabetes Neg Hx     Social History:  reports that she has never smoked. She has never used smokeless tobacco. She reports that she does not drink alcohol or use drugs.  ROS: UROLOGY Frequent Urination?: Yes Hard to postpone urination?: Yes Burning/pain with urination?: No Get up at night to urinate?: Yes Leakage of urine?: No Urine stream starts and stops?: No Trouble starting stream?:  No Do you have to strain to urinate?: No Blood in urine?: No Urinary tract infection?: Yes Sexually transmitted disease?: No Injury to kidneys or bladder?: No Painful intercourse?: No Weak stream?: No Currently pregnant?: No Vaginal bleeding?: No Last menstrual period?: n  Gastrointestinal Nausea?: No Vomiting?: No Indigestion/heartburn?: No Diarrhea?: No Constipation?: No  Constitutional Fever: No Night sweats?: No Weight loss?: No Fatigue?: No  Skin Skin rash/lesions?: No Itching?: No  Eyes Blurred vision?: No Double vision?: No  Ears/Nose/Throat Sore throat?: No Sinus problems?: No  Hematologic/Lymphatic Swollen glands?: No Easy bruising?: No  Cardiovascular Leg swelling?: No Chest pain?: No  Respiratory Cough?: No Shortness of breath?: No  Endocrine Excessive thirst?: No  Musculoskeletal Back pain?: Yes Joint pain?: No  Neurological Headaches?: No Dizziness?: No  Psychologic Depression?: No Anxiety?: No  Physical Exam: BP (!) 176/78 (BP Location: Left Arm, Patient Position: Sitting, Cuff Size: Normal)   Pulse 76   Ht 5\' 6"  (1.676 m)   Wt 55.6 kg   BMI 19.77 kg/m   Constitutional:  Alert and oriented, No acute distress.  Laboratory Data: Lab Results  Component Value Date   WBC 7.0 10/22/2017   HGB 10.9 (L) 10/22/2017   HCT 33.6 (L) 10/22/2017   MCV 102.1 (H) 10/22/2017   PLT 468 (H) 10/22/2017    Lab Results  Component Value Date   CREATININE 0.69 10/11/2017    No results found for: PSA  No results found for: TESTOSTERONE  No results found for: HGBA1C  Urinalysis    Component Value Date/Time   COLORURINE STRAW (A) 10/18/2017 1720   APPEARANCEUR CLEAR (A) 10/18/2017 1720   LABSPEC 1.003 (L) 10/18/2017 1720   PHURINE 7.0 10/18/2017 1720   GLUCOSEU NEGATIVE 10/18/2017 1720   GLUCOSEU NEGATIVE 11/28/2014 1341   HGBUR NEGATIVE 10/18/2017 1720   BILIRUBINUR NEGATIVE 10/18/2017 1720   BILIRUBINUR neg 12/16/2016 1448     KETONESUR NEGATIVE 10/18/2017 1720   PROTEINUR NEGATIVE 10/18/2017 1720   UROBILINOGEN 0.2 12/16/2016 1448   UROBILINOGEN 0.2 11/28/2014 1341   NITRITE NEGATIVE 10/18/2017 1720   LEUKOCYTESUR NEGATIVE 10/18/2017 1720    Pertinent Imaging:   Assessment & Plan: Reassess if still on medication in 4 months.  Call if urine cultures positive  1. Urinary frequency  - Urinalysis, Complete - CULTURE, URINE COMPREHENSIVE   No follow-ups on file.  Reece Packer, MD  [facility] [address] [place], [place] [address] [phone]

## 2017-12-07 ENCOUNTER — Telehealth: Payer: Self-pay | Admitting: Urology

## 2017-12-07 MED ORDER — TRIMETHOPRIM 100 MG PO TABS: 100.0000 mg | ORAL_TABLET | Freq: Every day | ORAL | 4 refills | Status: DC

## 2017-12-07 NOTE — Telephone Encounter (Signed)
Spoke with pt and it was for her trimethoprim. Macdiarmid is the original pre scriber. Enough refills has been sent in until she follows up. Per ov note she is to continue. Nothing further is needed

## 2017-12-07 NOTE — Telephone Encounter (Signed)
Patient called and said that she saw Macdiarmid yesterday but was not given a refill on her medication. She did not say which one on the voicemail. Can someone please call her and find out what she needs and get this called in for her?   Thanks, Sharyn Lull

## 2017-12-09 LAB — CULTURE, URINE COMPREHENSIVE

## 2017-12-13 MED ORDER — NITROFURANTOIN MONOHYD MACRO 100 MG PO CAPS: 100.0000 mg | ORAL_CAPSULE | Freq: Two times a day (BID) | ORAL | 0 refills | Status: DC

## 2017-12-13 NOTE — Addendum Note (Signed)
Addended by: Kyra Manges on: 12/13/2017 04:21 PM   Modules accepted: Orders

## 2017-12-16 ENCOUNTER — Ambulatory Visit (INDEPENDENT_AMBULATORY_CARE_PROVIDER_SITE_OTHER): Payer: Self-pay

## 2017-12-16 ENCOUNTER — Ambulatory Visit (INDEPENDENT_AMBULATORY_CARE_PROVIDER_SITE_OTHER): Payer: Medicare Other | Admitting: Orthopaedic Surgery

## 2017-12-16 DIAGNOSIS — S82402D Unspecified fracture of shaft of left fibula, subsequent encounter for closed fracture with routine healing: Secondary | ICD-10-CM

## 2017-12-16 DIAGNOSIS — S82202D Unspecified fracture of shaft of left tibia, subsequent encounter for closed fracture with routine healing: Secondary | ICD-10-CM

## 2017-12-16 NOTE — Progress Notes (Signed)
Post-Op Visit Note   Patient: [name]           Date of Birth: 06/09/1926           MRN: [id number] Visit Date: 12/16/2017 PCP: Baxter Hire, MD   Assessment & Plan:  Chief Complaint:  Chief Complaint  Patient presents with  . Left Leg - Pain, Follow-up, Routine Post Op   Visit Diagnoses:  1. Closed fracture of left tibia and fibula with routine healing, subsequent encounter     Plan: [name] is a 84 year old female who is 38 days status post intramedullary fixation of a left tibia fracture and closed treatment of fibular shaft fracture.  She reports no pain today.  She is doing well overall.  She has been ambulating with a Cam walker.  She has had some recent swelling and redness around her ankle.  She is had previous ankle surgery also.  She denies any calf pain or swelling.  She did visit an urgent care and she was treated with oral antibiotics for suspected cellulitis but she did not notice any improvement from the antibiotics.  She endorses some moderate discomfort in her ankle joint. Overall her x-rays demonstrate continued healing of both tibia and fibula fractures.  The fixation is stable without complication.  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  She was reassured by this.  I do not think that she is presenting like a DVT.  ASO brace was applied today.  We took her off of the cam boot.  I will see her back in 6 weeks for repeat 2 view x-rays of the left tibia.  Follow-Up Instructions: Return in about 6 weeks (around 01/27/2018).   Orders:  Orders Placed This Encounter  Procedures  . XR Tibia/Fibula Left   No orders of the defined types were placed in this encounter.   Imaging: Xr Tibia/fibula Left  Result Date: 12/16/2017 Stable intramedullary fixation and alignment of spiral tibia fracture.  Abundant callus formation of fibula and tibia fracture.   PMFS  History: Patient Active Problem List   Diagnosis Date Noted  . Closed fracture of shaft of left fibula with routine healing   . Closed displaced spiral fracture of shaft of left tibia 10/09/2017  . Tibia/fibula fracture 10/09/2017  . 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 Iliac bone pain 07/29/2015  . Osteoporosis 03/13/2015  . Raynaud phenomenon 02/24/2015  . Hyponatremia 02/09/2015  . Urinary frequency 01/30/2015  . Medicare annual wellness visit, subsequent 01/06/2015  . Encounter for preventive health examination 01/06/2015  . Underweight 06/15/2014  . Chronic left hip pain 06/13/2014  . Benign essential tremor 10/02/2013  . Constipation 08/12/2013  . Urinary incontinence, urge 05/29/2012  . Nocturnal leg cramps 12/15/2011  . Screening for breast cancer 10/27/2011  . Degeneration of lumbar or lumbosacral intervertebral disc 09/01/2011  . Insomnia 01/07/2011  . Hyperlipidemia LDL goal <130 11/08/2010  . Back pain, sacroiliac 11/08/2010  . Anxiety   . Essential hypertension 09/18/2010   Past Medical History:  Diagnosis Date  . Anxiety   . Arthritis    "may have a touch; all over I guess" (10/11/2017)  . Back pain    "all my back" (10/11/2017)  . Basal cell carcinoma    "have had many taken off; burned" (10/11/2017)  . Chronic orthostatic hypotension June 2012   with pprior syncopal event,  did not tolelrate florinef trial  . Constipation 08/12/2013  . Depression   . Hyperlipidemia   . Hypertension   . Urinary frequency     Family History  Problem Relation Age of Onset  . Colon cancer Mother   . Heart disease Father   . Kidney disease Father   . Cancer Neg Hx   . Diabetes Neg Hx     Past Surgical History:  Procedure Laterality Date  . ANKLE FRACTURE SURGERY Left 1998  . CATARACT EXTRACTION W/ INTRAOCULAR LENS  IMPLANT, BILATERAL Bilateral   . FRACTURE SURGERY    . IM NAILING TIBIA Left 10/10/2017  . TIBIA IM NAIL INSERTION Left 10/10/2017    Procedure: INTRAMEDULLARY (IM) NAIL TIBIAL;  Surgeon: Leandrew Koyanagi, MD;  Location: [place];  Service: Orthopedics;  Laterality: Left;  . WRIST FRACTURE SURGERY Right 2005   Social History   Occupational History  . 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 Smoking status: Never Smoker  . Smokeless tobacco: Never Used  Substance and Sexual Activity  . Alcohol use: Never    Frequency: Never  . Drug use: Never  . Sexual activity: Not Currently

## 2017-12-17 ENCOUNTER — Telehealth (INDEPENDENT_AMBULATORY_CARE_PROVIDER_SITE_OTHER): Payer: Self-pay | Admitting: Orthopaedic Surgery

## 2017-12-17 NOTE — Telephone Encounter (Signed)
Merry Proud, PT, from [facility] left a message to request a verbal okay for extension of [place] [facility] for 1x a week for every other week.  CB#[phone].  Thank you.

## 2017-12-20 NOTE — Telephone Encounter (Signed)
Okay on orders. 

## 2017-12-20 NOTE — Telephone Encounter (Signed)
yes

## 2017-12-21 NOTE — Telephone Encounter (Signed)
Called Merry Proud back approving orders.

## 2017-12-23 ENCOUNTER — Other Ambulatory Visit: Payer: Medicare Other

## 2017-12-23 ENCOUNTER — Telehealth: Payer: Self-pay | Admitting: Urology

## 2017-12-23 DIAGNOSIS — R350 Frequency of micturition: Secondary | ICD-10-CM

## 2017-12-23 LAB — URINALYSIS, COMPLETE
Bilirubin, UA: NEGATIVE
Glucose, UA: NEGATIVE
Ketones, UA: NEGATIVE
Leukocytes, UA: NEGATIVE
Nitrite, UA: NEGATIVE
Protein, UA: NEGATIVE
RBC, UA: NEGATIVE
Specific Gravity, UA: 1.015 (ref 1.005–1.030)
Urobilinogen, Ur: 0.2 mg/dL (ref 0.2–1.0)
pH, UA: 7 (ref 5.0–7.5)

## 2017-12-23 NOTE — Telephone Encounter (Signed)
Attempted to reach pt, no answer left vm to call back, [name] and I checked schedule and saw sooner appts with MacDiarmid sooner than Feb. Please let me know when she calls

## 2017-12-23 NOTE — Telephone Encounter (Signed)
Pt just finished an ABX, she wasn't sure of name, but MacDiarmid told her to go back on her Trimephoprim.  She had to use the bathroom every hour last night and she wonders why she is doing that?  What is causing that.  Please give pt a call [phone]

## 2017-12-23 NOTE — Telephone Encounter (Signed)
Pt following up on earlier message, pt would like to speak to someone, states her situation is getting worse, I did go ahead and make an appt on Macdiarmids first available follow up in Feb. 2020. Please advise. Thanks

## 2017-12-23 NOTE — Telephone Encounter (Signed)
Spoke with pt who states she has finished the abx that she was placed on but she is still experiencing frequent urination at night to the point she is unable to sleep. She denies dysuria, hematuria, increase in caffeine or drinking close to her bedtime. She feels like her sxs are getting worse. Offered for pt to come by office to do a urine sample, she stated she would come in the morning 12/24/17 but unsure of time. She was really concerned about being scheduled for an appt for this in Feb and felt like she needed to be seen sooner.    Can someone see if we have any sooner appts? Does Larene Beach have anything?

## 2017-12-23 NOTE — Telephone Encounter (Signed)
Pt came in to office and dropped off urine sample.  She refused to fill out UTI check list, she said she had already told [facility] everything on the phone.  The only symptom pt said she was having was frequency.

## 2017-12-24 NOTE — Telephone Encounter (Signed)
Pt called pt to office and Olivia Mackie spoke with pt, pt has been rescheduled to 01/10/18. She is aware that we are still awaiting urine culture results  Dr. Matilde Sprang this is an [place]

## 2017-12-27 LAB — CULTURE, URINE COMPREHENSIVE

## 2017-12-31 ENCOUNTER — Ambulatory Visit (INDEPENDENT_AMBULATORY_CARE_PROVIDER_SITE_OTHER): Payer: Medicare Other | Admitting: Orthopaedic Surgery

## 2018-01-10 ENCOUNTER — Encounter: Payer: Self-pay | Admitting: Urology

## 2018-01-10 ENCOUNTER — Ambulatory Visit (INDEPENDENT_AMBULATORY_CARE_PROVIDER_SITE_OTHER): Payer: Medicare Other | Admitting: Urology

## 2018-01-10 ENCOUNTER — Encounter

## 2018-01-10 VITALS — BP 175/90 | HR 61 | Ht 66.0 in | Wt 120.0 lb

## 2018-01-10 DIAGNOSIS — R350 Frequency of micturition: Secondary | ICD-10-CM | POA: Diagnosis not present

## 2018-01-10 LAB — URINALYSIS, COMPLETE
Bilirubin, UA: NEGATIVE
Glucose, UA: NEGATIVE
Ketones, UA: NEGATIVE
Leukocytes, UA: NEGATIVE
Nitrite, UA: NEGATIVE
Protein, UA: NEGATIVE
RBC, UA: NEGATIVE
Specific Gravity, UA: 1.015 (ref 1.005–1.030)
Urobilinogen, Ur: 0.2 mg/dL (ref 0.2–1.0)
pH, UA: 7 (ref 5.0–7.5)

## 2018-01-10 MED ORDER — MIRABEGRON ER 50 MG PO TB24: 50.0000 mg | ORAL_TABLET | Freq: Every day | ORAL | 11 refills | Status: DC

## 2018-01-10 NOTE — Progress Notes (Signed)
01/10/2018 10:20 AM   [name] 30-Jan-1926 [id number]  Referring provider: Baxter Hire, MD [place], [place] [address]  Chief Complaint  Patient presents with  . Urinary Frequency    more frequently at night now    HPI: was consulted by the above provider to assess the patient's probable recurrent bladder infections. Some of the details of the history were difficult to ascertain but I think she is been treated with an antibiotic a number times in the last several months. The primary symptom she is infected is frequency  In between infection she voids every 3 hours and is no nocturia. She is continent  She has had a stroke. She now has a cast on her leg for the next 4 weeks. Normally she is ambulatory. She has not had a hysterectomy.   Work-up and pathophysiology discussed. Recommended renal ultrasound and call if abnormal. Reassess in 6 weeks on daily trimethoprim.  Ultrasound negative.  Urine culture negative Clinically not infected today.  I had a circular conversation regarding taking medication.  It will ultimately have to be her choice.  Reassess in 4 months  Today Urine culture December 2+ and on December 19 it was negative Frequency stable Clinically the patient has not had a bladder infection on the trimethoprim.  She still getting up 5 times a night but voiding less frequently during the day.  Once again we had a very lengthy circular conversation.  She understands that I am trying to prevent infections that have been occult in the past.  I like her to stay on the trimethoprim.  I talked about nighttime diuresis and bladder overactivity.  I finally had to tease her that I felt I could not have her understand any better.  In all honesty each and every visit she comes in the conversation is ongoing no matter how much time and clarity I give to the patient.    PMH: Past Medical History:  Diagnosis Date  . Anxiety   . Arthritis    "may have a touch; all over I guess" (10/11/2017)  . Back pain    "all my back" (10/11/2017)  . Basal cell carcinoma    "have had many taken off; burned" (10/11/2017)  . Chronic orthostatic hypotension June 2012   with pprior syncopal event,  did not tolelrate florinef trial  . Constipation 08/12/2013  . Depression   . Hyperlipidemia   . Hypertension   . Urinary frequency     Surgical History: Past Surgical History:  Procedure Laterality Date  . ANKLE FRACTURE SURGERY Left 1998  . CATARACT EXTRACTION W/ INTRAOCULAR LENS  IMPLANT, BILATERAL Bilateral   . FRACTURE SURGERY    . IM NAILING TIBIA Left 10/10/2017  . TIBIA IM NAIL INSERTION Left 10/10/2017   Procedure: INTRAMEDULLARY (IM) NAIL TIBIAL;  Surgeon: Leandrew Koyanagi, MD;  Location: [facility];  Service: Orthopedics;  Laterality: Left;  . WRIST FRACTURE SURGERY Right 2005    Home Medications:  Allergies as of 01/10/2018   No Known Allergies     Medication List       Accurate as of January 10, 2018 10:20 AM. Always use your most recent med list.        amLODipine 5 MG tablet Commonly known as:  NORVASC Take 1 tablet (5 mg total) by mouth daily.   cephALEXin 500 MG capsule Commonly known as:  KEFLEX Take 1 capsule (500 mg total) by mouth 3 (three) times daily.  Cholecalciferol 100 MCG (4000 UT) Caps Take 1 capsule by mouth daily.   Garlic Oil 4696 MG Caps Take 1 capsule by mouth daily.   GERITOL PO Take 1 tablet by mouth daily.   lactulose 10 GM/15ML solution Commonly known as:  CHRONULAC TAKE 30MLS(0NE OUNCE) BY MOUTH 2 TIMES DAILY AS NEEDED FOR MILD CONSTIPATION. DO NOT USE MORE THAN ONCE A WEEK   Lutein 20 MG Caps Take 20 mg by mouth daily.   mirabegron ER 50 MG Tb24 tablet Commonly known as:  MYRBETRIQ Take 1 tablet (50 mg total) by mouth daily.   nitrofurantoin (macrocrystal-monohydrate) 100 MG capsule Commonly known as:  MACROBID Take 1 capsule (100 mg total) by mouth every 12 (twelve) hours.   NON  FORMULARY Diet type: NAS   propranolol 60 MG tablet Commonly known as:  INDERAL Take 1 tablet (60 mg total) by mouth at bedtime.   sennosides-docusate sodium 8.6-50 MG tablet Commonly known as:  SENOKOT-S Take 2 tablets by mouth 2 (two) times daily.   trimethoprim 100 MG tablet Commonly known as:  TRIMPEX Take 1 tablet (100 mg total) by mouth daily.   VITAMIN E PO Take 400 Units by mouth daily.       Allergies: No Known Allergies  Family History: Family History  Problem Relation Age of Onset  . Colon cancer Mother   . Heart disease Father   . Kidney disease Father   . Cancer Neg Hx   . Diabetes Neg Hx     Social History:  reports that she has never smoked. She has never used smokeless tobacco. She reports that she does not drink alcohol or use drugs.  ROS: UROLOGY Frequent Urination?: Yes Hard to postpone urination?: No Burning/pain with urination?: No Get up at night to urinate?: Yes Leakage of urine?: No Urine stream starts and stops?: No Trouble starting stream?: No Do you have to strain to urinate?: No Blood in urine?: No Urinary tract infection?: No Sexually transmitted disease?: No Injury to kidneys or bladder?: No Painful intercourse?: No Weak stream?: No Currently pregnant?: No Vaginal bleeding?: No Last menstrual period?: n  Gastrointestinal Nausea?: No Vomiting?: No Indigestion/heartburn?: No Diarrhea?: No Constipation?: No  Constitutional Fever: No Night sweats?: No Weight loss?: No Fatigue?: No  Skin Skin rash/lesions?: No Itching?: No  Eyes Blurred vision?: No Double vision?: No  Ears/Nose/Throat Sore throat?: No Sinus problems?: No  Hematologic/Lymphatic Swollen glands?: No Easy bruising?: No  Cardiovascular Leg swelling?: No Chest pain?: No  Respiratory Cough?: No Shortness of breath?: No  Endocrine Excessive thirst?: No  Musculoskeletal Back pain?: Yes Joint pain?: No  Neurological Headaches?:  No Dizziness?: No  Psychologic Depression?: No Anxiety?: No  Physical Exam: BP (!) 175/90 (BP Location: Left Arm, Patient Position: Sitting)   Pulse 61   Ht 5\' 6"  (1.676 m)   Wt 120 lb (54.4 kg)   BMI 19.37 kg/m   Constitutional:  Alert and oriented, No acute distress.   Laboratory Data: Lab Results  Component Value Date   WBC 7.0 10/22/2017   HGB 10.9 (L) 10/22/2017   HCT 33.6 (L) 10/22/2017   MCV 102.1 (H) 10/22/2017   PLT 468 (H) 10/22/2017    Lab Results  Component Value Date   CREATININE 0.69 10/11/2017    No results found for: PSA  No results found for: TESTOSTERONE  No results found for: HGBA1C  Urinalysis    Component Value Date/Time   COLORURINE STRAW (A) 10/18/2017 1720   APPEARANCEUR Clear 12/23/2017  1556   LABSPEC 1.003 (L) 10/18/2017 1720   PHURINE 7.0 10/18/2017 1720   GLUCOSEU Negative 12/23/2017 1556   GLUCOSEU NEGATIVE 11/28/2014 1341   HGBUR NEGATIVE 10/18/2017 1720   BILIRUBINUR Negative 12/23/2017 1556   KETONESUR NEGATIVE 10/18/2017 1720   PROTEINUR Negative 12/23/2017 1556   PROTEINUR NEGATIVE 10/18/2017 1720   UROBILINOGEN 0.2 12/16/2016 1448   UROBILINOGEN 0.2 11/28/2014 1341   NITRITE Negative 12/23/2017 1556   NITRITE NEGATIVE 10/18/2017 1720   LEUKOCYTESUR Negative 12/23/2017 1556    Pertinent Imaging:   Assessment & Plan: Reassess in 6 weeks on Myrbetriq 50 mg samples and prescription.  Stay on trimethoprim.  I will try other antimuscarinics if needed.  Percutaneous tibial nerve stimulation may be an option.  I do not plan on prescribing desmopressin.  I am not certain if I can reach her goals in more than half a dozen times on this visit I would walk her through things and she would go back to the same issue not understanding  1. Urinary frequency  - Urinalysis, Complete - CULTURE, URINE COMPREHENSIVE   No follow-ups on file.  Reece Packer, MD  [facility] [address], [place], [place] [address] [phone]

## 2018-01-13 LAB — CULTURE, URINE COMPREHENSIVE

## 2018-01-27 ENCOUNTER — Ambulatory Visit (INDEPENDENT_AMBULATORY_CARE_PROVIDER_SITE_OTHER): Payer: Medicare Other | Admitting: Orthopaedic Surgery

## 2018-01-27 ENCOUNTER — Ambulatory Visit (INDEPENDENT_AMBULATORY_CARE_PROVIDER_SITE_OTHER): Payer: Medicare Other

## 2018-01-27 ENCOUNTER — Encounter (INDEPENDENT_AMBULATORY_CARE_PROVIDER_SITE_OTHER): Payer: Self-pay | Admitting: Orthopaedic Surgery

## 2018-01-27 DIAGNOSIS — S82402D Unspecified fracture of shaft of left fibula, subsequent encounter for closed fracture with routine healing: Secondary | ICD-10-CM

## 2018-01-27 DIAGNOSIS — S82202D Unspecified fracture of shaft of left tibia, subsequent encounter for closed fracture with routine healing: Secondary | ICD-10-CM | POA: Diagnosis not present

## 2018-01-27 NOTE — Progress Notes (Signed)
Office Visit Note   Patient: [name]           Date of Birth: 25-Sep-1926           MRN: [id number] Visit Date: 01/27/2018              Requested by: Baxter Hire, MD [place], [place] [address] PCP: Baxter Hire, MD   Assessment & Plan: Visit Diagnoses:  1. Closed fracture of left tibia and fibula with routine healing, subsequent encounter     Plan: At this point she is released to activity as tolerated.  I would like to check her 1 more time in 2 months with 2 view x-rays of the left tib-fib.  I anticipate releasing her at that time.  Follow-Up Instructions: Return in about 2 months (around 03/28/2018).   Orders:  Orders Placed This Encounter  Procedures  . XR Tibia/Fibula Left   No orders of the defined types were placed in this encounter.     Procedures: No procedures performed   Clinical Data: No additional findings.   Subjective: Chief Complaint  Patient presents with  . Left Ankle - Pain, Follow-up    [name] is 109 days status post intramedullary tibial nail insertion.  She is doing very well.  She walks in today without any assistive devices.  She wears an ASO brace for support.  She reports no pain or swelling.   Review of Systems   Objective: Vital Signs: There were no vitals taken for this visit.  Physical Exam  Ortho Exam Left lower extremity exam shows fully healed surgical scars.  She has no swelling.  The skin has completely resolved and healed. Specialty Comments:  No specialty comments available.  Imaging: Xr Tibia/fibula Left  Result Date: 01/27/2018 There is significant healing and fracture callus formation of the tibia.  No hardware complications.  Alignment is anatomic.    PMFS History: Patient Active Problem List   Diagnosis Date Noted  . Closed fracture of shaft of left fibula with routine healing   . Closed displaced spiral fracture of shaft of left tibia 10/09/2017  . Tibia/fibula fracture  10/09/2017  . 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 Iliac bone pain 07/29/2015  . Osteoporosis 03/13/2015  . Raynaud phenomenon 02/24/2015  . Hyponatremia 02/09/2015  . Urinary frequency 01/30/2015  . Medicare annual wellness visit, subsequent 01/06/2015  . Encounter for preventive health examination 01/06/2015  . Underweight 06/15/2014  . Chronic left hip pain 06/13/2014  . Benign essential tremor 10/02/2013  . Constipation 08/12/2013  . Urinary incontinence, urge 05/29/2012  . Nocturnal leg cramps 12/15/2011  . Screening for breast cancer 10/27/2011  . Degeneration of lumbar or lumbosacral intervertebral disc 09/01/2011  . Insomnia 01/07/2011  . Hyperlipidemia LDL goal <130 11/08/2010  . Back pain, sacroiliac 11/08/2010  . Anxiety   . Essential hypertension 09/18/2010   Past Medical History:  Diagnosis Date  . Anxiety   . Arthritis    "may have a touch; all over I guess" (10/11/2017)  . Back pain    "all my back" (10/11/2017)  . Basal cell carcinoma    "have had many taken off; burned" (10/11/2017)  . Chronic orthostatic hypotension June 2012   with pprior syncopal event,  did not tolelrate florinef trial  . Constipation 08/12/2013  . Depression   . Hyperlipidemia   . Hypertension   . Urinary frequency     Family History  Problem Relation  Age of Onset  . Colon cancer Mother   . Heart disease Father   . Kidney disease Father   . Cancer Neg Hx   . Diabetes Neg Hx     Past Surgical History:  Procedure Laterality Date  . ANKLE FRACTURE SURGERY Left 1998  . CATARACT EXTRACTION W/ INTRAOCULAR LENS  IMPLANT, BILATERAL Bilateral   . FRACTURE SURGERY    . IM NAILING TIBIA Left 10/10/2017  . TIBIA IM NAIL INSERTION Left 10/10/2017   Procedure: INTRAMEDULLARY (IM) NAIL TIBIAL;  Surgeon: Leandrew Koyanagi, MD;  Location: [facility];  Service: Orthopedics;  Laterality: Left;  . WRIST FRACTURE SURGERY Right 2005   Social History   Occupational History  .  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 Smoking status: Never Smoker  . Smokeless tobacco: Never Used  Substance and Sexual Activity  . Alcohol use: Never    Frequency: Never  . Drug use: Never  . Sexual activity: Not Currently

## 2018-02-07 ENCOUNTER — Ambulatory Visit: Payer: Medicare Other | Admitting: Urology

## 2018-02-07 ENCOUNTER — Encounter: Payer: Self-pay | Admitting: Urology

## 2018-02-07 VITALS — BP 155/83 | HR 70 | Ht 66.0 in | Wt 120.0 lb

## 2018-02-07 DIAGNOSIS — N3946 Mixed incontinence: Secondary | ICD-10-CM | POA: Diagnosis not present

## 2018-02-07 NOTE — Progress Notes (Signed)
02/07/2018 10:14 AM   [name] [birth year] [id number]  Referring provider: Baxter Hire, MD [place], [place] [address]  Chief Complaint  Patient presents with  . Urinary Frequency    HPI: I was consulted by the above provider to assess the patient's probable recurrent bladder infections. Some of the details of the history were difficult to ascertain but I think she is been treated with an antibiotic a number times in the last several months. The primary symptom she is infected is frequency  In between infection she voids every 3 hours and is no nocturia. She is continent  She has had a stroke.   Urine culture negative I had a circular conversation regarding taking medication. It will ultimately have to be her choice.  Reassess in 4 months  Urine culture December 2+ and on December 19 it was negative Clinically the patient has not had a bladder infection on the trimethoprim.  She still getting up 5 times a night but voiding less frequently during the day.  Once again we had a very lengthy circular conversation.  She understands that I am trying to prevent infections that have been occult in the past.  I like her to stay on the trimethoprim.  I talked about nighttime diuresis and bladder overactivity.  I finally had to tease her that I felt I could not have her understand any better.  In all honesty each and every visit she comes in the conversation is ongoing no matter how much time and clarity I give to the patient.  Reassess in 6 weeks on Myrbetriq 50 mg samples and prescription.  Stay on trimethoprim.  I will try other antimuscarinics if needed.  Percutaneous tibial nerve stimulation may be an option.  I do not plan on prescribing desmopressin.  I am not certain if I can reach her goals in more than half a dozen times on this visit I would walk her through things and she would go back to the same issue not understanding  Day Frequency stable.   Nighttime frequency improved.  She stopped all medications and wants to stay off them.  Again she wanted to know why she gets bladder spasms and I explained to her but it was not beneficial.  She wanted 2 weeks of samples even though she said it did not work and is too expensive.  PMH: Past Medical History:  Diagnosis Date  . Anxiety   . Arthritis    "may have a touch; all over I guess" (10/11/2017)  . Back pain    "all my back" (10/11/2017)  . Basal cell carcinoma    "have had many taken off; burned" (10/11/2017)  . Chronic orthostatic hypotension June 2012   with pprior syncopal event,  did not tolelrate florinef trial  . Constipation 08/12/2013  . Depression   . Hyperlipidemia   . Hypertension   . Urinary frequency     Surgical History: Past Surgical History:  Procedure Laterality Date  . ANKLE FRACTURE SURGERY Left 1998  . CATARACT EXTRACTION W/ INTRAOCULAR LENS  IMPLANT, BILATERAL Bilateral   . FRACTURE SURGERY    . IM NAILING TIBIA Left 10/10/2017  . TIBIA IM NAIL INSERTION Left 10/10/2017   Procedure: INTRAMEDULLARY (IM) NAIL TIBIAL;  Surgeon: Leandrew Koyanagi, MD;  Location: [facility];  Service: Orthopedics;  Laterality: Left;  . WRIST FRACTURE SURGERY Right 2005    Home Medications:  Allergies as of 02/07/2018   No Known Allergies  Medication List       Accurate as of February 07, 2018 10:14 AM. Always use your most recent med list.        amLODipine 5 MG tablet Commonly known as:  NORVASC Take 1 tablet (5 mg total) by mouth daily.   cephALEXin 500 MG capsule Commonly known as:  KEFLEX Take 1 capsule (500 mg total) by mouth 3 (three) times daily.   Cholecalciferol 100 MCG (4000 UT) Caps Take 1 capsule by mouth daily.   Garlic Oil 0762 MG Caps Take 1 capsule by mouth daily.   GERITOL PO Take 1 tablet by mouth daily.   lactulose 10 GM/15ML solution Commonly known as:  CHRONULAC TAKE 30MLS(0NE OUNCE) BY MOUTH 2 TIMES DAILY AS NEEDED FOR MILD CONSTIPATION. DO  NOT USE MORE THAN ONCE A WEEK   Lutein 20 MG Caps Take 20 mg by mouth daily.   mirabegron ER 50 MG Tb24 tablet Commonly known as:  MYRBETRIQ Take 1 tablet (50 mg total) by mouth daily.   nitrofurantoin (macrocrystal-monohydrate) 100 MG capsule Commonly known as:  MACROBID Take 1 capsule (100 mg total) by mouth every 12 (twelve) hours.   NON FORMULARY Diet type: NAS   propranolol 60 MG tablet Commonly known as:  INDERAL Take 1 tablet (60 mg total) by mouth at bedtime.   sennosides-docusate sodium 8.6-50 MG tablet Commonly known as:  SENOKOT-S Take 2 tablets by mouth 2 (two) times daily.   trimethoprim 100 MG tablet Commonly known as:  TRIMPEX Take 1 tablet (100 mg total) by mouth daily.   VITAMIN E PO Take 400 Units by mouth daily.       Allergies: No Known Allergies  Family History: Family History  Problem Relation Age of Onset  . Colon cancer Mother   . Heart disease Father   . Kidney disease Father   . Cancer Neg Hx   . Diabetes Neg Hx     Social History:  reports that she has never smoked. She has never used smokeless tobacco. She reports that she does not drink alcohol or use drugs.  ROS:                                        Physical Exam: There were no vitals taken for this visit.  Constitutional:  Alert and oriented, No acute distress.  Laboratory Data: Lab Results  Component Value Date   WBC 7.0 10/22/2017   HGB 10.9 (L) 10/22/2017   HCT 33.6 (L) 10/22/2017   MCV 102.1 (H) 10/22/2017   PLT 468 (H) 10/22/2017    Lab Results  Component Value Date   CREATININE 0.69 10/11/2017    No results found for: PSA  No results found for: TESTOSTERONE  No results found for: HGBA1C  Urinalysis    Component Value Date/Time   COLORURINE STRAW (A) 10/18/2017 1720   APPEARANCEUR Clear 01/10/2018 0949   LABSPEC 1.003 (L) 10/18/2017 1720   PHURINE 7.0 10/18/2017 1720   GLUCOSEU Negative 01/10/2018 0949   GLUCOSEU NEGATIVE  11/28/2014 1341   HGBUR NEGATIVE 10/18/2017 1720   BILIRUBINUR Negative 01/10/2018 [place] 10/18/2017 1720   PROTEINUR Negative 01/10/2018 0949   PROTEINUR NEGATIVE 10/18/2017 1720   UROBILINOGEN 0.2 12/16/2016 1448   UROBILINOGEN 0.2 11/28/2014 1341   NITRITE Negative 01/10/2018 0949   NITRITE NEGATIVE 10/18/2017 1720   LEUKOCYTESUR Negative 01/10/2018 0949  Pertinent Imaging:   Assessment & Plan: I will see the patient as needed.  I do not think I will be able to reach her treatment goal.  There are no diagnoses linked to this encounter.  No follow-ups on file.  Reece Packer, MD  [facility] [address], [place], [place] [address] [phone]

## 2018-02-21 ENCOUNTER — Ambulatory Visit: Payer: Medicare Other | Admitting: Urology

## 2018-02-21 ENCOUNTER — Telehealth (INDEPENDENT_AMBULATORY_CARE_PROVIDER_SITE_OTHER): Payer: Self-pay | Admitting: Orthopaedic Surgery

## 2018-02-21 NOTE — Telephone Encounter (Signed)
See message.

## 2018-02-21 NOTE — Telephone Encounter (Signed)
Yes it's fine to work in the yard

## 2018-02-21 NOTE — Telephone Encounter (Signed)
Called patient to let her know

## 2018-02-21 NOTE — Telephone Encounter (Signed)
Patient called advised she has been working out in her yard. Patient asked if she should be outside working in her yard. Patient said she has her brace on. Patient said her ankle does not hurt but her back does. Patient said she have a little tingling in her ankle. The number to contact patient is [phone]

## 2018-03-28 ENCOUNTER — Telehealth (INDEPENDENT_AMBULATORY_CARE_PROVIDER_SITE_OTHER): Payer: Self-pay

## 2018-03-28 NOTE — Telephone Encounter (Signed)
Called Patient and she would like to cx appt. States she is doing well and will call us if she has any issues.

## 2018-03-29 ENCOUNTER — Encounter (INDEPENDENT_AMBULATORY_CARE_PROVIDER_SITE_OTHER): Payer: Self-pay

## 2018-03-29 ENCOUNTER — Ambulatory Visit (INDEPENDENT_AMBULATORY_CARE_PROVIDER_SITE_OTHER): Payer: Medicare Other | Admitting: Orthopaedic Surgery

## 2018-04-06 ENCOUNTER — Telehealth: Payer: Self-pay

## 2018-04-06 NOTE — Telephone Encounter (Signed)
Copied from [facility] [phone]. Topic: Appointment Scheduling - Transfer of Care >> Apr 06, 2018 11:34 AM Richardo Priest, NT wrote: Pt is requesting to transfer FROM: [name] Pt is requesting to transfer TO: [name] Reason for requested transfer: Requesting a transfer back to [place]. Patient states she has had slight numbness in left arm that subsides, but comes back after waking up. Has seen another doctor for this problem, however would like to see Dr.Tullo again. Last seen by her 01/26/2017. Requesting a call back from [facility] personally. Call back number [phone].   Send CRM to patient's current PCP (transferring FROM).

## 2018-04-06 NOTE — Telephone Encounter (Signed)
Set her up for a video visit , a telephone visit or a face to face.

## 2018-04-06 NOTE — Telephone Encounter (Signed)
Spoke with pt to see about scheduling her an appt this week. The pt stated that she can do a telephone visit so pt has been scheduled for 9:30am. The pt wanted to let you know that starting yesterday she noticed some numbness in her left arm that comes and goes. The pt is questioning as to whether or not she is having a stroke. The pt is not experiencing any other symptoms, no slurred speech, no visual changes, and no weakness. The pt is concerned because she has had a stroke in her right eye in the past. Or she is wondering if this could be coming from neuropathy?

## 2018-04-07 ENCOUNTER — Ambulatory Visit (INDEPENDENT_AMBULATORY_CARE_PROVIDER_SITE_OTHER): Payer: Medicare Other | Admitting: Internal Medicine

## 2018-04-07 ENCOUNTER — Other Ambulatory Visit: Payer: Self-pay

## 2018-04-07 ENCOUNTER — Ambulatory Visit (INDEPENDENT_AMBULATORY_CARE_PROVIDER_SITE_OTHER): Payer: Medicare Other

## 2018-04-07 DIAGNOSIS — R200 Anesthesia of skin: Secondary | ICD-10-CM | POA: Diagnosis not present

## 2018-04-07 DIAGNOSIS — I10 Essential (primary) hypertension: Secondary | ICD-10-CM

## 2018-04-07 DIAGNOSIS — M4722 Other spondylosis with radiculopathy, cervical region: Secondary | ICD-10-CM | POA: Insufficient documentation

## 2018-04-07 DIAGNOSIS — M542 Cervicalgia: Secondary | ICD-10-CM | POA: Diagnosis not present

## 2018-04-07 DIAGNOSIS — R202 Paresthesia of skin: Secondary | ICD-10-CM | POA: Diagnosis not present

## 2018-04-07 DIAGNOSIS — I679 Cerebrovascular disease, unspecified: Secondary | ICD-10-CM | POA: Insufficient documentation

## 2018-04-07 NOTE — Assessment & Plan Note (Signed)
Symptoms are mild and intermittent. Plain films of cervical spine reflect severe multilevel degenerative changes with probable foraminal stenosis .further  workup discouraged unless she develops deficits given age

## 2018-04-07 NOTE — Progress Notes (Addendum)
Virtual Visit via Video Note  I connected with@ on April 07 2018  at  9:30 AM EDT by telephone  and verified that I am speaking with the correct person using two identifiers.  Location patient: home Location provider:work  Persons participating in the virtual visit: patient, provider  I discussed the limitations of evaluation and management by telemedicine and the availability of in person appointments. The patient expressed understanding and agreed to proceed.   HPI: 85 yr female with history of hypertension, asymptomatic cerebrovascular disease and symptomatic  lumbar spinal stenosis requesting transfer of care and assessment of intermittent left arm tingling and numbness and reassurance that she is not having a stroke. The tingling and altered sensation  has been present for several days, occurring intermittently  She denies loss of strength in either arm  Has chronic right sided neck pain MSK in nature and continues to have low back pain that was aggravated by a bowel movement several years ago. . left  Arm feels normal today but hand feels slightly numb.  Can still feel heat and cold,  No loss of dexterity.   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 She has discontinued her amlodipine5 because her home readings have been < 150/90 ,    Back pain getting worse, history of spinal stenosis .  Feet feeling colder than usual.  She has Reynaud's   Not taking amlodipine any more for hypertension,  States that home readings have been 283 to 151 systolic. Marland Kitchenlives alone.   History of left tib/fib fracture  Oct 2019 , post intramedullary tibial nail insertion  ROS: See pertinent positives and negatives per HPI.  Past Medical History:  Diagnosis Date  . Anxiety   . Arthritis    "may have a touch; all over I guess" (10/11/2017)  . Back pain    "all my back" (10/11/2017)  . Basal cell carcinoma    "have had  many taken off; burned" (10/11/2017)  . Chronic orthostatic hypotension June 2012   with pprior syncopal event,  did not tolelrate florinef trial  . Constipation 08/12/2013  . Depression   . Hyperlipidemia   . Hypertension   . Urinary frequency     Past Surgical History:  Procedure Laterality Date  . ANKLE FRACTURE SURGERY Left 1998  . CATARACT EXTRACTION W/ INTRAOCULAR LENS  IMPLANT, BILATERAL Bilateral   . FRACTURE SURGERY    . IM NAILING TIBIA Left 10/10/2017  . TIBIA IM NAIL INSERTION Left 10/10/2017   Procedure: INTRAMEDULLARY (IM) NAIL TIBIAL;  Surgeon: Leandrew Koyanagi, MD;  Location: [facility];  Service: Orthopedics;  Laterality: Left;  . WRIST FRACTURE SURGERY Right 2005    Family History  Problem Relation Age of Onset  . Colon cancer Mother   . Heart disease Father   . Kidney disease Father   . Cancer Neg Hx   . Diabetes Neg Hx     SOCIAL HX: widowed, lives alone    Current Outpatient Medications:  .  diazepam (VALIUM) 5 MG tablet, Take 5 mg by mouth 2 (two) times daily., Disp: , Rfl:  .  Garlic Oil 7616 MG CAPS, Take 1 capsule by mouth daily. , Disp: , Rfl:  .  lactulose (CHRONULAC) 10 GM/15ML solution, TAKE 30MLS(0NE OUNCE) BY MOUTH 2 TIMES DAILY AS NEEDED FOR MILD CONSTIPATION. DO NOT USE MORE THAN ONCE A WEEK, Disp: 240 mL,  Rfl: 0 .  Lutein 20 MG CAPS, Take 20 mg by mouth daily.  , Disp: , Rfl:  .  NON FORMULARY, Diet type: NAS, Disp: , Rfl:  .  propranolol (INDERAL) 60 MG tablet, Take 1 tablet (60 mg total) by mouth at bedtime., Disp: 30 tablet, Rfl: 0 .  sennosides-docusate sodium (SENOKOT-S) 8.6-50 MG tablet, Take 2 tablets by mouth 2 (two) times daily., Disp: , Rfl:  .  VITAMIN E PO, Take 400 Units by mouth daily. , Disp: , Rfl:  .  Ascorbic Acid (VITAMIN C) 1000 MG tablet, Take 1,000 mg by mouth daily., Disp: , Rfl:  .  ASPIRIN 81 PO, Take by mouth., Disp: , Rfl:  .  calcium-vitamin D 250-100 MG-UNIT tablet, Take 1 tablet by mouth 2 (two) times daily., Disp: , Rfl:   .  FIBER PO, Take by mouth., Disp: , Rfl:  .  Ginger, Zingiber officinalis, (GINGER PO), Take by mouth., Disp: , Rfl:   EXAM:  VITALS per patient if applicable: patent reports home BP readings that range from  148/70 to 118/68.  GENERAL: alert, oriented, sounds to be in no acute distress  LUNGS: she exhibited on signs of respiratory distress during phone conversation,  Breathing was inaudible , no obvious gross SOB, gasping or wheezing  MS: states that she can move all  extremities without limitation  PSYCH/NEURO: pleasant and cooperative, no obvious depression or anxiety, speech and thought processing grossly intact  ASSESSMENT AND PLAN:  Discussed the following assessment and plan:  Numbness and tingling in left arm - Plan: DG Cervical Spine Complete  Neck pain on right side - Plan: DG Cervical Spine Complete  Essential hypertension  Cervical radiculopathy due to degenerative joint disease of spine  CVD (cerebrovascular disease)  A total of 25 minutes of face to face time was spent with patient more than half of which was spent in counselling about the above mentioned conditions  and coordination of care     I discussed the assessment and treatment plan with the patient. The patient was provided an opportunity [id number] plan and demonstrated an understanding of the instructions.   The patient was advised to call back or seek an in-person evaluation if the symptoms worsen or if the condition fails to improve as anticipated.  I provided 25 minutes of non-face-to-face time during this encounter.   Crecencio Mc, MD

## 2018-04-07 NOTE — Assessment & Plan Note (Signed)
With prior lacunar infarcts noted on 2018 MRI

## 2018-04-07 NOTE — Assessment & Plan Note (Addendum)
No longer taking amlodipine  . Home readings have ranged from 148/70 to 118/68  She has a history of lacunar infarcts by 2018 MRI , which I reviewed with her today .  encouraged her to reconsider use of amlodipine but she has declined.

## 2018-04-11 ENCOUNTER — Telehealth: Payer: Self-pay

## 2018-04-11 NOTE — Telephone Encounter (Signed)
See result note message 

## 2018-04-11 NOTE — Telephone Encounter (Signed)
Copied from [place] [phone]. Topic: General - Other >> Apr 08, 2018  4:35 PM Mcneil, Ja-Kwan wrote: Reason for CRM: Pt returned call to [place]. Pt requests call back.

## 2018-04-11 NOTE — Telephone Encounter (Signed)
Copied from [place]. Topic: General - Other >> Apr 11, 2018  8:04 AM Alanda Slim E wrote: Reason for CRM: Pt returned call to speak with Jessica/ please call Pt asa early as possible she plans on being out and away from phone

## 2018-04-11 NOTE — Telephone Encounter (Signed)
Patient returning a call to [place]. Result notes do not give PEC approval to go over results with patient. Would like a call early tomorrow morning. Please advise.

## 2018-04-12 NOTE — Telephone Encounter (Signed)
Please see result note 

## 2018-04-18 ENCOUNTER — Ambulatory Visit: Payer: Medicare Other | Admitting: Urology

## 2018-04-25 ENCOUNTER — Ambulatory Visit: Payer: Medicare Other | Admitting: Urology

## 2018-05-06 ENCOUNTER — Other Ambulatory Visit: Payer: Self-pay | Admitting: Internal Medicine

## 2018-05-06 DIAGNOSIS — R1904 Left lower quadrant abdominal swelling, mass and lump: Secondary | ICD-10-CM

## 2018-06-07 ENCOUNTER — Ambulatory Visit
Admission: RE | Admit: 2018-06-07 | Discharge: 2018-06-07 | Disposition: A | Discharge status: Home / Self Care | Payer: Medicare Other | Source: Ambulatory Visit | Attending: Internal Medicine | Admitting: Internal Medicine

## 2018-06-07 ENCOUNTER — Other Ambulatory Visit: Payer: Self-pay

## 2018-06-07 DIAGNOSIS — R1904 Left lower quadrant abdominal swelling, mass and lump: Secondary | ICD-10-CM

## 2018-06-07 LAB — POCT I-STAT CREATININE: Creatinine, Ser: 0.8 mg/dL (ref 0.44–1.00)

## 2018-06-07 MED ORDER — IOHEXOL 300 MG/ML  SOLN
75.0000 mL | Freq: Once | INTRAMUSCULAR | Status: AC | PRN
  Administered 2018-06-07: 75 mL via INTRAVENOUS

## 2018-07-07 DIAGNOSIS — L570 Actinic keratosis: Secondary | ICD-10-CM | POA: Diagnosis not present

## 2018-07-07 DIAGNOSIS — L249 Irritant contact dermatitis, unspecified cause: Secondary | ICD-10-CM | POA: Diagnosis not present

## 2018-07-07 DIAGNOSIS — L821 Other seborrheic keratosis: Secondary | ICD-10-CM | POA: Diagnosis not present

## 2018-09-15 DIAGNOSIS — H3562 Retinal hemorrhage, left eye: Secondary | ICD-10-CM | POA: Diagnosis not present

## 2018-09-15 DIAGNOSIS — H353221 Exudative age-related macular degeneration, left eye, with active choroidal neovascularization: Secondary | ICD-10-CM | POA: Diagnosis not present

## 2018-09-15 DIAGNOSIS — H3411 Central retinal artery occlusion, right eye: Secondary | ICD-10-CM | POA: Diagnosis not present

## 2018-09-23 DIAGNOSIS — H3411 Central retinal artery occlusion, right eye: Secondary | ICD-10-CM | POA: Diagnosis not present

## 2018-10-26 DIAGNOSIS — I10 Essential (primary) hypertension: Secondary | ICD-10-CM | POA: Diagnosis not present

## 2018-10-26 DIAGNOSIS — I639 Cerebral infarction, unspecified: Secondary | ICD-10-CM | POA: Diagnosis not present

## 2018-10-26 DIAGNOSIS — R251 Tremor, unspecified: Secondary | ICD-10-CM | POA: Diagnosis not present

## 2018-10-26 DIAGNOSIS — E782 Mixed hyperlipidemia: Secondary | ICD-10-CM | POA: Diagnosis not present

## 2018-10-26 DIAGNOSIS — H5461 Unqualified visual loss, right eye, normal vision left eye: Secondary | ICD-10-CM | POA: Diagnosis not present

## 2018-10-26 LAB — TSH: TSH: 1.94 (ref 0.41–5.90)

## 2018-11-10 ENCOUNTER — Other Ambulatory Visit: Payer: Self-pay

## 2018-11-14 ENCOUNTER — Ambulatory Visit (INDEPENDENT_AMBULATORY_CARE_PROVIDER_SITE_OTHER): Payer: Medicare Other | Admitting: Internal Medicine

## 2018-11-14 ENCOUNTER — Encounter: Payer: Self-pay | Admitting: Internal Medicine

## 2018-11-14 ENCOUNTER — Other Ambulatory Visit: Payer: Self-pay

## 2018-11-14 VITALS — BP 118/70 | HR 65 | Temp 97.5°F | Ht 66.0 in | Wt 127.8 lb

## 2018-11-14 DIAGNOSIS — R1904 Left lower quadrant abdominal swelling, mass and lump: Secondary | ICD-10-CM | POA: Diagnosis not present

## 2018-11-14 NOTE — Progress Notes (Signed)
Subjective:  Patient ID: [name], female    DOB: 26-Mar-1926  Age: 85 y.o. MRN: [id number]  CC: The encounter diagnosis was Abdominal mass, left lower quadrant.  HPI [name] presents for  Lower abdominal swelling without pain , first noticed by patient  in late April believed to be a hernia  Had an imaging study orderd by Dr Wynetta Emery , CT abd and pelvis  No hernia seen    patient  has protuberant asymettric lower abdomen when she standa,  Which resolves  Completely  when lying supine .  No history of abdominal surgeries . Has significant levoscoliosis which may be  contributing   Outpatient Medications Prior to Visit  Medication Sig Dispense Refill   Ascorbic Acid (VITAMIN C) 1000 MG tablet Take 1,000 mg by mouth daily.     ASPIRIN 81 PO Take by mouth.     calcium-vitamin D 250-100 MG-UNIT tablet Take 1 tablet by mouth 2 (two) times daily.     diazepam (VALIUM) 5 MG tablet Take 5 mg by mouth 2 (two) times daily.     FIBER PO Take by mouth.     Garlic Oil 123XX123 MG CAPS Take 1 capsule by mouth daily.      Ginger, Zingiber officinalis, (GINGER PO) Take by mouth.     lactulose (CHRONULAC) 10 GM/15ML solution TAKE 30MLS(0NE OUNCE) BY MOUTH 2 TIMES DAILY AS NEEDED FOR MILD CONSTIPATION. DO NOT USE MORE THAN ONCE A WEEK 240 mL 0   Lutein 20 MG CAPS Take 20 mg by mouth daily.       NON FORMULARY Diet type: NAS     propranolol (INDERAL) 60 MG tablet Take 1 tablet (60 mg total) by mouth at bedtime. 30 tablet 0   sennosides-docusate sodium (SENOKOT-S) 8.6-50 MG tablet Take 2 tablets by mouth 2 (two) times daily.     VITAMIN E PO Take 400 Units by mouth daily.      hydrocortisone 2.5 % cream APPLY TOPICALLY TO FACIAL RASH DAILY AS NEEDED WHEN ITCHY OR RED     Cholecalciferol 4000 units CAPS Take 1 capsule by mouth daily.     Iron-Vitamins (GERITOL PO) Take 1 tablet by mouth daily.     No facility-administered medications prior to visit.     Review of  Systems;  Patient denies headache, fevers, malaise, unintentional weight loss, skin rash, eye pain, sinus congestion and sinus pain, sore throat, dysphagia,  hemoptysis , cough, dyspnea, wheezing, chest pain, palpitations, orthopnea, edema, abdominal pain, nausea, melena, diarrhea, constipation, flank pain, dysuria, hematuria, urinary  Frequency, nocturia, numbness, tingling, seizures,  Focal weakness, Loss of consciousness,  Tremor, insomnia, depression, anxiety, and suicidal ideation.      Objective:  BP 118/70    Pulse 65    Temp (!) 97.5 F (36.4 C) (Oral)    Ht 5\' 6"  (1.676 m)    Wt 127 lb 12.8 oz (58 kg)    SpO2 97%    BMI 20.63 kg/m   BP Readings from Last 3 Encounters:  11/14/18 118/70  02/07/18 (!) 155/83  01/10/18 (!) 175/90    Wt Readings from Last 3 Encounters:  11/14/18 127 lb 12.8 oz (58 kg)  02/07/18 120 lb (54.4 kg)  01/10/18 120 lb (54.4 kg)    General appearance: alert, cooperative and appears stated age Ears: normal TM's and external ear canals both ears Throat: lips, mucosa, and tongue normal; teeth and gums normal Neck: no adenopathy, no carotid bruit, supple, symmetrical,  trachea midline and thyroid not enlarged, symmetric, no tenderness/mass/nodules Back: symmetric, no curvature. ROM normal. No CVA tenderness. Lungs: clear to auscultation bilaterally Heart: regular rate and rhythm, S1, S2 normal, no murmur, click, rub or gallop Abdomen: soft, non-tender; bowel sounds normal; no masses,  no organomegaly Pulses: 2+ and symmetric Skin: Skin color, texture, turgor normal. No rashes or lesions Lymph nodes: Cervical, supraclavicular, and axillary nodes normal.  No results found for: HGBA1C  Lab Results  Component Value Date   CREATININE 0.80 06/07/2018   CREATININE 0.69 10/11/2017   CREATININE 0.78 10/10/2017    Lab Results  Component Value Date   WBC 7.0 10/22/2017   HGB 10.9 (L) 10/22/2017   HCT 33.6 (L) 10/22/2017   PLT 468 (H) 10/22/2017    GLUCOSE 110 (H) 10/11/2017   CHOL 254 (H) 06/09/2016   TRIG 103.0 06/09/2016   HDL 60.00 06/09/2016   LDLDIRECT 145.6 12/21/2012   LDLCALC 174 (H) 06/09/2016   ALT 12 10/06/2017   AST 22 10/06/2017   NA 136 10/11/2017   K 4.3 10/11/2017   CL 104 10/11/2017   CREATININE 0.80 06/07/2018   BUN 12 10/11/2017   CO2 26 10/11/2017   TSH 1.92 01/04/2015   INR 0.99 11/30/2016    Ct Abdomen Pelvis W Contrast  Result Date: 06/07/2018 CLINICAL DATA:  Left lower quadrant abdominal wall palpable abnormality. Suspected hernia. EXAM: CT ABDOMEN AND PELVIS WITH CONTRAST TECHNIQUE: Multidetector CT imaging of the abdomen and pelvis was performed using the standard protocol following bolus administration of intravenous contrast. CONTRAST:  72mL OMNIPAQUE IOHEXOL 300 MG/ML  SOLN COMPARISON:  None. FINDINGS: Lower Chest: No acute findings. Hepatobiliary: No hepatic masses identified. Gallbladder is unremarkable. Pancreas:  No mass or inflammatory changes. Spleen: Within normal limits in size and appearance. Adrenals/Urinary Tract: No masses identified. No evidence of hydronephrosis. Stomach/Bowel: No evidence of obstruction, inflammatory process or abnormal fluid collections. Normal appendix visualized. Diverticulosis is seen mainly involving the descending and sigmoid colon, however there is no evidence of diverticulitis. Vascular/Lymphatic: No pathologically enlarged lymph nodes. No abdominal aortic aneurysm. Aortic atherosclerosis. Reproductive:  No mass or other significant abnormality. Other:  No evidence of abdominal wall hernia or soft tissue mass. Musculoskeletal: Moderate lumbar levoscoliosis noted. No suspicious bone lesions identified. Old L1 vertebral body compression deformity is seen. Moderate to severe multilevel lumbar degenerative disc disease also seen. IMPRESSION: 1. No evidence of abdominal wall hernia, mass, or other acute findings. 2. Colonic diverticulosis. No radiographic evidence of  diverticulitis. 3. Lumbar levoscoliosis and degenerative spondylosis. Aortic Atherosclerosis (ICD10-I70.0). Electronically Signed   By: Earle Gell M.D.   On: 06/07/2018 19:57    Assessment & Plan:   Problem List Items Addressed This Visit      Unprioritized   Abdominal mass, left lower quadrant - Primary    Recent CT reviewed with no evidence of ventral or inguinal hernia.  Exam is abnormal, with a protuberant asymmetric abdomen,  But I believe her abdominal distension is due to poor muscle tone and  asymmetric due to levoscoliosis.  She requiries reassurance that she doe snot have a hernia.  referral to Dr Bary Castilla.       Relevant Orders   Ambulatory referral to [facility]      I have discontinued Georgetta B. Kinnan "[name]"'s Iron-Vitamins (GERITOL PO), Cholecalciferol, and hydrocortisone. I am also having her maintain her Lutein, VITAMIN E PO, Garlic Oil, NON FORMULARY, sennosides-docusate sodium, propranolol, lactulose, diazepam, vitamin C, FIBER PO, ASPIRIN 81 PO, (Ginger,  Zingiber officinalis, (GINGER PO)), and calcium-vitamin D.  No orders of the defined types were placed in this encounter.   Medications Discontinued During This Encounter  Medication Reason   Iron-Vitamins (GERITOL PO) Patient Preference   hydrocortisone 2.5 % cream Patient Preference   Cholecalciferol 4000 units CAPS Patient Preference   A total of 25 minutes of face to face time was spent with patient more than half of which was spent in counselling about the above mentioned conditions  and coordination of care    Follow-up: No follow-ups on file.   Crecencio Mc, MD

## 2018-11-14 NOTE — Patient Instructions (Signed)
I AM MAKING A REFERRAL TO DR JEFFREY BYRNETT .  HE IS A GENERAL SURGEON THAT I TRUST   If this is a hernia  i'll be a monkey's uncle!  I think it is  Variant of normal caused by your scoliosis

## 2018-11-15 ENCOUNTER — Telehealth: Payer: Self-pay | Admitting: *Deleted

## 2018-11-15 DIAGNOSIS — R1904 Left lower quadrant abdominal swelling, mass and lump: Secondary | ICD-10-CM | POA: Insufficient documentation

## 2018-11-15 NOTE — Telephone Encounter (Signed)
Copied from [place] [phone]. Topic: General - Inquiry >> Nov 15, 2018  1:00 PM Alease Frame wrote: Reason for CRM: patient would like a call back from someone at office in regards to a referral Dr Derrel Nip gave patient

## 2018-11-15 NOTE — Assessment & Plan Note (Addendum)
Recent CT reviewed with no evidence of ventral or inguinal hernia.  Exam is abnormal, with a protuberant asymmetric abdomen,  But I believe her abdominal distension is due to poor muscle tone and  asymmetric due to levoscoliosis.  She requiries reassurance that she doe snot have a hernia.  referral to Dr Bary Castilla.

## 2018-11-17 NOTE — Telephone Encounter (Signed)
Called and spoke to pt.  Patient said that her issue has been resolved.  Pt said that she has a scheduled appt w/ Dr. Hervey Ard, surgeon on 11/29/18.

## 2018-11-17 NOTE — Telephone Encounter (Signed)
Tried to reach patient no answer and voicemail has not been setup.

## 2018-11-21 ENCOUNTER — Telehealth: Payer: Self-pay | Admitting: *Deleted

## 2018-11-21 NOTE — Telephone Encounter (Signed)
Copied from [place] [phone]. Topic: General - Inquiry >> Nov 21, 2018  8:41 AM Scherrie Gerlach wrote: Reason for CRM: pt would like her med list updated.  Pt would like taken off: lactulose (CHRONULAC) 10 GM/15ML solution  NON FORMULARY  propranolol (INDERAL) 60 MG tablet is every other day sennosides-docusate sodium (SENOKOT-S) 8.6-50 MG tablet is prn  Add : vit c daily  Pt states she has appt next week with a surgeon and she wants her med list right.  Pt would then like a copy of updated med list mailed to her home.

## 2018-11-21 NOTE — Telephone Encounter (Signed)
Medication list has been updated. Mailed a copy of updated med list to pt.

## 2018-11-28 ENCOUNTER — Encounter: Payer: Self-pay | Admitting: Urology

## 2018-11-28 ENCOUNTER — Ambulatory Visit: Payer: Medicare Other | Admitting: Urology

## 2018-11-28 ENCOUNTER — Other Ambulatory Visit: Payer: Self-pay

## 2018-11-28 VITALS — BP 142/78 | Ht 66.0 in | Wt 127.0 lb

## 2018-11-28 DIAGNOSIS — R350 Frequency of micturition: Secondary | ICD-10-CM

## 2018-11-28 LAB — MICROSCOPIC EXAMINATION: RBC: NONE SEEN /hpf (ref 0–2)

## 2018-11-28 LAB — URINALYSIS, COMPLETE
Bilirubin, UA: NEGATIVE
Glucose, UA: NEGATIVE
Ketones, UA: NEGATIVE
Nitrite, UA: NEGATIVE
Protein,UA: NEGATIVE
RBC, UA: NEGATIVE
Specific Gravity, UA: 1.025 (ref 1.005–1.030)
Urobilinogen, Ur: 0.2 mg/dL (ref 0.2–1.0)
pH, UA: 7 (ref 5.0–7.5)

## 2018-11-28 MED ORDER — TRIMETHOPRIM 100 MG PO TABS: 100.0000 mg | ORAL_TABLET | Freq: Every day | ORAL | 11 refills | Status: DC

## 2018-11-28 NOTE — Progress Notes (Signed)
11/28/2018 1:20 PM   [name] 07/17/1926 [id number]  Referring provider: Crecencio Mc, MD [place],  [place] [address]  Chief Complaint  Patient presents with  . Nocturia    HPI: I was consulted by the above provider to assess the patient's probable recurrent bladder infections. Some of the details of the history were difficult to ascertain The primary symptom she is infected is frequency  In between infection she voids every 3 hours and is no nocturia. She is continent  I had a circular conversation regarding taking medication. It will ultimately have to be her choice.Reassess in 4 months  Urine culture December 2+ and on December 19it was negative  Clinically the patient has not had a bladder infection on the trimethoprim. She still getting up 5 times a night but voiding less frequently during the day. Once again we had a very lengthy circular conversation. She understands that I am trying to prevent infections that have been occult in the past. I like her to stay on the trimethoprim. I talked about nighttime diuresis and bladder overactivity. I finally had to tease her that I felt I could not have her understand any better. In all honesty each and every visit she comes in the conversation is ongoing no matter how much time and clarity I give to the patient.  Reassess in 6 weeks on Myrbetriq 50 mg samples and prescription. Stay on trimethoprim. I will try other antimuscarinics if needed. Percutaneous tibial nerve stimulation may be an option. I do not plan on prescribing desmopressin. I am not certain if I can reach her goals in more than half a dozen times on this visit I would walk her through things and she would go back to the same issue not understanding  Frequency stable.  Nighttime frequency improved.  She stopped all medications and wants to stay off them.  Again she wanted to know why she gets bladder spasms and I  explained to her but it was not beneficial.  She wanted 2 weeks of samples even though she said it did not work and is too expensive.  I will see the patient as needed.  I do not think I will be able to reach her treatment goal.  Today The patient was getting up 4-5 times a night and self treat herself with daily trimethoprim for 2 weeks now gets up once.  Clinically not infected.  Again she wanted no more about her bodily functions.  I was able to redirect to keep her on daily trimethoprim and freshen up her prescription and see her in 1 year.  She agreed  PMH: Past Medical History:  Diagnosis Date  . Anxiety   . Arthritis    "may have a touch; all over I guess" (10/11/2017)  . Back pain    "all my back" (10/11/2017)  . Basal cell carcinoma    "have had many taken off; burned" (10/11/2017)  . Chronic orthostatic hypotension June 2012   with pprior syncopal event,  did not tolelrate florinef trial  . Constipation 08/12/2013  . Depression   . Hyperlipidemia   . Hypertension   . Urinary frequency     Surgical History: Past Surgical History:  Procedure Laterality Date  . ANKLE FRACTURE SURGERY Left 1998  . CATARACT EXTRACTION W/ INTRAOCULAR LENS  IMPLANT, BILATERAL Bilateral   . FRACTURE SURGERY    . IM NAILING TIBIA Left 10/10/2017  . TIBIA IM NAIL INSERTION Left 10/10/2017  Procedure: INTRAMEDULLARY (IM) NAIL TIBIAL;  Surgeon: Leandrew Koyanagi, MD;  Location: [facility];  Service: Orthopedics;  Laterality: Left;  . WRIST FRACTURE SURGERY Right 2005    Home Medications:  Allergies as of 11/28/2018   No Known Allergies     Medication List       Accurate as of November 28, 2018  1:20 PM. If you have any questions, ask your nurse or doctor.        ASPIRIN 81 PO Take by mouth.   calcium-vitamin D 250-100 MG-UNIT tablet Take 1 tablet by mouth 2 (two) times daily.   diazepam 5 MG tablet Commonly known as: VALIUM Take 5 mg by mouth 2 (two) times daily.   FIBER PO Take by mouth.    Garlic Oil 123XX123 MG Caps Take 1 capsule by mouth daily.   GINGER PO Take by mouth.   Lutein 20 MG Caps Take 20 mg by mouth daily.   NON FORMULARY Diet type: NAS   propranolol 60 MG tablet Commonly known as: INDERAL Take 60 mg by mouth every other day.   sennosides-docusate sodium 8.6-50 MG tablet Commonly known as: SENOKOT-S Take 2 tablets by mouth as needed.   trimethoprim 100 MG tablet Commonly known as: TRIMPEX Take 1 tablet (100 mg total) by mouth daily. Started by: Reece Packer, MD   vitamin C 1000 MG tablet Take 1,000 mg by mouth daily.   VITAMIN E PO Take 400 Units by mouth daily.       Allergies: No Known Allergies  Family History: Family History  Problem Relation Age of Onset  . Colon cancer Mother   . Heart disease Father   . Kidney disease Father   . Cancer Neg Hx   . Diabetes Neg Hx     Social History:  reports that she has never smoked. She has never used smokeless tobacco. She reports that she does not drink alcohol or use drugs.  ROS: UROLOGY Frequent Urination?: Yes Hard to postpone urination?: No Burning/pain with urination?: No Get up at night to urinate?: Yes Leakage of urine?: No Urine stream starts and stops?: No Trouble starting stream?: No Do you have to strain to urinate?: No Blood in urine?: No Urinary tract infection?: No Sexually transmitted disease?: No Injury to kidneys or bladder?: No Painful intercourse?: No Weak stream?: No Currently pregnant?: No Vaginal bleeding?: No Last menstrual period?: N  Gastrointestinal Nausea?: No Vomiting?: No Indigestion/heartburn?: No Diarrhea?: No Constipation?: No  Constitutional Fever: No Night sweats?: No Weight loss?: No Fatigue?: No  Skin Skin rash/lesions?: No Itching?: No  Eyes Blurred vision?: No Double vision?: No  Ears/Nose/Throat Sore throat?: No Sinus problems?: No  Hematologic/Lymphatic Swollen glands?: No Easy bruising?: No   Cardiovascular Leg swelling?: No Chest pain?: No  Respiratory Cough?: No Shortness of breath?: No  Endocrine Excessive thirst?: No  Musculoskeletal Back pain?: No Joint pain?: No  Neurological Headaches?: No Dizziness?: No  Psychologic Depression?: No Anxiety?: No  Physical Exam: BP (!) 142/78   Ht 5\' 6"  (1.676 m)   Wt 127 lb (57.6 kg)   BMI 20.50 kg/m   Constitutional:  Alert and oriented, No acute distress.   Laboratory Data: Lab Results  Component Value Date   WBC 7.0 10/22/2017   HGB 10.9 (L) 10/22/2017   HCT 33.6 (L) 10/22/2017   MCV 102.1 (H) 10/22/2017   PLT 468 (H) 10/22/2017    Lab Results  Component Value Date   CREATININE 0.80 06/07/2018  No results found for: PSA  No results found for: TESTOSTERONE  No results found for: HGBA1C  Urinalysis    Component Value Date/Time   COLORURINE STRAW (A) 10/18/2017 1720   APPEARANCEUR Clear 01/10/2018 0949   LABSPEC 1.003 (L) 10/18/2017 1720   PHURINE 7.0 10/18/2017 1720   GLUCOSEU Negative 01/10/2018 0949   GLUCOSEU NEGATIVE 11/28/2014 1341   HGBUR NEGATIVE 10/18/2017 1720   BILIRUBINUR Negative 01/10/2018 0949   KETONESUR NEGATIVE 10/18/2017 1720   PROTEINUR Negative 01/10/2018 0949   PROTEINUR NEGATIVE 10/18/2017 1720   UROBILINOGEN 0.2 12/16/2016 1448   UROBILINOGEN 0.2 11/28/2014 1341   NITRITE Negative 01/10/2018 0949   NITRITE NEGATIVE 10/18/2017 1720   LEUKOCYTESUR Negative 01/10/2018 0949    Pertinent Imaging:   Assessment & Plan: Reassess 1 year  1. Urinary frequency  - Urinalysis, Complete   Return in about 1 year (around 11/28/2019) for MD follow up.  Reece Packer, MD  [facility] [address], [place], [place] [address] [phone]

## 2019-01-10 ENCOUNTER — Other Ambulatory Visit: Payer: Self-pay

## 2019-01-10 ENCOUNTER — Encounter: Payer: Self-pay | Admitting: Internal Medicine

## 2019-01-10 ENCOUNTER — Ambulatory Visit (INDEPENDENT_AMBULATORY_CARE_PROVIDER_SITE_OTHER): Payer: Medicare Other | Admitting: Internal Medicine

## 2019-01-10 DIAGNOSIS — F419 Anxiety disorder, unspecified: Secondary | ICD-10-CM

## 2019-01-10 DIAGNOSIS — L679 Hair color and hair shaft abnormality, unspecified: Secondary | ICD-10-CM | POA: Diagnosis not present

## 2019-01-10 NOTE — Progress Notes (Signed)
Telephone Note  This visit type was conducted due to national recommendations for restrictions regarding the COVID-19 pandemic (e.g. social distancing).  This format is felt to be most appropriate for this patient at this time.  All issues noted in this document were discussed and addressed.  No physical exam was performed (except for noted visual exam findings with Video Visits).   I connected with@ on 01/10/19 at  1:30 PM EST by telephone and verified that I am speaking with the correct person using two identifiers. Location patient: home Location provider: work or home office Persons participating in the virtual visit: patient, provider  I discussed the limitations, risks, security and privacy concerns of performing an evaluation and management service by telephone and the availability of in person appointments. I also discussed with the patient that there may be a patient responsible charge related to this service. The patient expressed understanding and agreed to proceed.  Reason for visit: follow up on abdominal swelling  2) hair thinning , nails have become hard and brittle   HPI:  Saw Dr. Bary Castilla: for evaluation of lower abdomen,  He examined her and reviewed the CT scan and concurred that there was no hernia or abdominal mass.  She continues to worry about the "pop" that she heard and felt several YEARS ago during a difficult bowel movment.  Reassurance provided that the abnormality she feels is due to her spinal abnormalities. Now wearing a girdle which helps .  Hair loss.  She feels like her hair is thinning and her skin is  Becoming very wrinkled and thin she denies weight loss,  Change in diet. .     ROS: See pertinent positives and negatives per HPI.  Past Medical History:  Diagnosis Date  . Anxiety   . Arthritis    "may have a touch; all over I guess" (10/11/2017)  . Back pain    "all my back" (10/11/2017)  . Basal cell carcinoma    "have had many taken off; burned"  (10/11/2017)  . Chronic orthostatic hypotension June 2012   with pprior syncopal event,  did not tolelrate florinef trial  . Constipation 08/12/2013  . Depression   . Hyperlipidemia   . Hypertension   . Urinary frequency     Past Surgical History:  Procedure Laterality Date  . ANKLE FRACTURE SURGERY Left 1998  . CATARACT EXTRACTION W/ INTRAOCULAR LENS  IMPLANT, BILATERAL Bilateral   . FRACTURE SURGERY    . IM NAILING TIBIA Left 10/10/2017  . TIBIA IM NAIL INSERTION Left 10/10/2017   Procedure: INTRAMEDULLARY (IM) NAIL TIBIAL;  Surgeon: Leandrew Koyanagi, MD;  Location: [facility];  Service: Orthopedics;  Laterality: Left;  . WRIST FRACTURE SURGERY Right 2005    Family History  Problem Relation Age of Onset  . Colon cancer Mother   . Heart disease Father   . Kidney disease Father   . Cancer Neg Hx   . Diabetes Neg Hx     SOCIAL HX:  reports that she has never smoked. She has never used smokeless tobacco. She reports that she does not drink alcohol or use drugs.  Current Outpatient Medications:  .  Ascorbic Acid (VITAMIN C) 1000 MG tablet, Take 1,000 mg by mouth daily., Disp: , Rfl:  .  ASPIRIN 81 PO, Take by mouth., Disp: , Rfl:  .  calcium-vitamin D 250-100 MG-UNIT tablet, Take 1 tablet by mouth 2 (two) times daily., Disp: , Rfl:  .  diazepam (VALIUM) 5 MG tablet, Take  5 mg by mouth 2 (two) times daily., Disp: , Rfl:  .  FIBER PO, Take by mouth., Disp: , Rfl:  .  Garlic Oil 123XX123 MG CAPS, Take 1 capsule by mouth daily. , Disp: , Rfl:  .  Ginger, Zingiber officinalis, (GINGER PO), Take by mouth., Disp: , Rfl:  .  Lutein 20 MG CAPS, Take 20 mg by mouth daily.  , Disp: , Rfl:  .  NON FORMULARY, Diet type: NAS, Disp: , Rfl:  .  propranolol (INDERAL) 60 MG tablet, Take 60 mg by mouth every other day., Disp: , Rfl:  .  sennosides-docusate sodium (SENOKOT-S) 8.6-50 MG tablet, Take 2 tablets by mouth as needed. , Disp: , Rfl:  .  trimethoprim (TRIMPEX) 100 MG tablet, Take 1 tablet (100 mg  total) by mouth daily., Disp: 30 tablet, Rfl: 11 .  VITAMIN E PO, Take 400 Units by mouth daily. , Disp: , Rfl:   EXAM:  VITALS per patient if applicable:  GENERAL: alert, oriented, appears well and in no acute distress  HEENT: atraumatic, conjunttiva clear, no obvious abnormalities on inspection of external nose and ears  NECK: normal movements of the head and neck  LUNGS: on inspection no signs of respiratory distress, breathing rate appears normal, no obvious gross SOB, gasping or wheezing  CV: no obvious cyanosis  MS: moves all visible extremities without noticeable abnormality  PSYCH/NEURO: pleasant and cooperative, no obvious depression or anxiety, speech and thought processing grossly intact  ASSESSMENT AND PLAN:  Discussed the following assessment and plan:  Anxiety  Hair changes  Anxiety She is managing her anxiety without valium.  Hair changes Recommend supplements to support hair  Skin and nails.     I discussed the assessment and treatment plan with the patient. The patient was provided an opportunity to ask questions and all were answered. The patient agreed with the plan and demonstrated an understanding of the instructions.   The patient was advised to call back or seek an in-person evaluation if the symptoms worsen or if the condition fails to improve as anticipated.  I provided  15 minutes of non-face-to-face time during this encounter reviewing patient's current problems and past procedures/imaging studies, providing counseling on the above mentioned problems , and coordination  of care .   Crecencio Mc, MD

## 2019-01-10 NOTE — Assessment & Plan Note (Signed)
Recommend supplements to support hair  Skin and nails.

## 2019-01-10 NOTE — Assessment & Plan Note (Signed)
She is managing her anxiety without valium.

## 2019-01-13 DIAGNOSIS — D485 Neoplasm of uncertain behavior of skin: Secondary | ICD-10-CM | POA: Diagnosis not present

## 2019-01-13 DIAGNOSIS — X32XXXA Exposure to sunlight, initial encounter: Secondary | ICD-10-CM | POA: Diagnosis not present

## 2019-01-13 DIAGNOSIS — D0461 Carcinoma in situ of skin of right upper limb, including shoulder: Secondary | ICD-10-CM | POA: Diagnosis not present

## 2019-01-13 DIAGNOSIS — L570 Actinic keratosis: Secondary | ICD-10-CM | POA: Diagnosis not present

## 2019-01-13 DIAGNOSIS — L821 Other seborrheic keratosis: Secondary | ICD-10-CM | POA: Diagnosis not present

## 2019-01-13 DIAGNOSIS — D0339 Melanoma in situ of other parts of face: Secondary | ICD-10-CM | POA: Diagnosis not present

## 2019-01-30 ENCOUNTER — Telehealth: Payer: Self-pay | Admitting: Urology

## 2019-01-30 MED ORDER — MIRABEGRON ER 50 MG PO TB24: 50.0000 mg | ORAL_TABLET | Freq: Every day | ORAL | 10 refills | Status: DC

## 2019-01-30 NOTE — Telephone Encounter (Signed)
Called and spoke with patient she states her night time voids have started to increase again and she is getting up 4-5 times a night. This does seem to vary. She had samples of Myrbetriq still and started taking them 2wks ago and symptoms seem to be some what better. Patient wonders if she needs to see PCP for new sleep medication or if she should take Myrbetriq. Patient was offered a follow up appointment with Dr. Matilde Sprang to discuss further and if she would like a script for myrbetriq to be sent to pharmacy. She has one week of samples left and states she will check with her pharmacy about price of myrbetriq and will check with PCP and call us back on how she would like to proceed

## 2019-01-30 NOTE — Telephone Encounter (Signed)
Pt called office asking to speak to sarah, [place] states she has talked to Judson Roch earlier this morning and would like to speak to her again re: her Rx that was sent in. Please advise. Thank you.

## 2019-01-30 NOTE — Telephone Encounter (Signed)
Script sent  

## 2019-01-30 NOTE — Telephone Encounter (Signed)
Pt called back and would like an RX sent to pharmacy for Myrbetriq.

## 2019-01-30 NOTE — Telephone Encounter (Signed)
Patient will take one more week of myrbetriq and record voiding dairy at night and follow up next week with Dr. Matilde Sprang to discuss

## 2019-01-30 NOTE — Telephone Encounter (Signed)
Pt would like to speak to Dr. Matilde Sprang or his "nurse" about medication, and asks that when you call please speak immediately and refer to her as "[name]" or she'll hang up thinking it is telemarketer. Please call [name] at [phone]

## 2019-02-02 DIAGNOSIS — D0339 Melanoma in situ of other parts of face: Secondary | ICD-10-CM | POA: Diagnosis not present

## 2019-02-02 DIAGNOSIS — D0461 Carcinoma in situ of skin of right upper limb, including shoulder: Secondary | ICD-10-CM | POA: Diagnosis not present

## 2019-02-03 ENCOUNTER — Other Ambulatory Visit: Payer: Self-pay | Admitting: Internal Medicine

## 2019-02-03 ENCOUNTER — Telehealth: Payer: Self-pay | Admitting: Internal Medicine

## 2019-02-03 DIAGNOSIS — Z9889 Other specified postprocedural states: Secondary | ICD-10-CM

## 2019-02-03 NOTE — Telephone Encounter (Signed)
i have placed the home health order for wound care. please let patient know that medicare may not pay for this without a face to face evaluation so she may have to come to office for an RN visit to change dressing, or contact the dermatologist who did the biopsy to put in the home health order  Note copied from chat, from PCP.   Called patient to advise PCP has placed a referral for home health for wound care due to surgical intervention from dermatology on 02/02/19. May Patient may need face to face in office before insurance will pay for home health. Due to face to face was with dermatology for biopsy on back.

## 2019-02-06 ENCOUNTER — Other Ambulatory Visit: Payer: Self-pay

## 2019-02-06 ENCOUNTER — Ambulatory Visit: Payer: Medicare Other | Admitting: Urology

## 2019-02-06 ENCOUNTER — Encounter: Payer: Self-pay | Admitting: Urology

## 2019-02-06 ENCOUNTER — Ambulatory Visit: Payer: Medicare Other | Admitting: *Deleted

## 2019-02-06 VITALS — BP 195/79 | HR 68 | Ht 66.0 in | Wt 127.0 lb

## 2019-02-06 VITALS — BP 120/67 | HR 68 | Temp 97.6°F

## 2019-02-06 DIAGNOSIS — R351 Nocturia: Secondary | ICD-10-CM

## 2019-02-06 DIAGNOSIS — Z9889 Other specified postprocedural states: Secondary | ICD-10-CM

## 2019-02-06 DIAGNOSIS — R350 Frequency of micturition: Secondary | ICD-10-CM | POA: Diagnosis not present

## 2019-02-06 MED ORDER — OXYBUTYNIN CHLORIDE ER 10 MG PO TB24: 10.0000 mg | ORAL_TABLET | Freq: Every day | ORAL | 11 refills | Status: DC

## 2019-02-06 NOTE — Addendum Note (Signed)
Addended by: Verlene Mayer A on: 02/06/2019 04:19 PM   Modules accepted: Orders

## 2019-02-06 NOTE — Progress Notes (Signed)
Patient in for wound check after excision for abnormal skin biopsy to right shoulder, area was clean with Band-Aid covering and no drainage with small 0.25 cm circular incision. Granulation tissue forming to bottom of wound bed and no odor or drainage noted.  Patient coming back on 2/4/21for nurse to change band-aid and to make sure of healing. Patient says she can change band- aid but feels better if nurse evaluates. If she changes her mind she will call and cancel.

## 2019-02-06 NOTE — Progress Notes (Signed)
02/06/2019 2:09 PM   [name] Jun 25, 1926 [id number]  Referring provider: Crecencio Mc, MD [place],  [place] [address]  Chief Complaint  Patient presents with  . Urinary Frequency    HPI: I was consulted by the above provider to assess the patient's probable recurrent bladder infections. Some of the details of the history were difficult to ascertain The primary symptom she is infected is frequency  In between infection she voids every 3 hours and is no nocturia. She is continent  I had a circular conversation regarding taking medication. It will ultimately have to be her choice.  Urine culture December 2+ and on December 19it was negative  Clinically the patient has not had a bladder infection on the trimethoprim. She still getting up 5 times a night but voiding less frequently during the day. Once again we had a very lengthy circular conversation. She understands that I am trying to prevent infections that have been occult in the past. I like her to stay on the trimethoprim. I talked about nighttime diuresis and bladder overactivity. I finally had to tease her that I felt I could not have her understand any better. In all honesty each and every visit she comes in the conversation is ongoing no matter how much time and clarity I give to the patient.  Reassess in 6 weeks on Myrbetriq 50 mg samples and prescription. Stay on trimethoprim. I will try other antimuscarinics if needed. Percutaneous tibial nerve stimulation may be an option. I do not plan on prescribing desmopressin. I am not certain if I can reach her goals in more than half a dozen times on this visit I would walk her through things and she would go back to the same issue not understanding  Frequency stable. Nighttime frequency improved. She stopped all medications and wants to stay off them. Again she wanted to know why she gets bladder spasms and I explained to  her but it was not beneficial. She wanted 2 weeks of samples even though she said it did not work and is too expensive.  I will see the patient as needed. I do not think I will be able to reach her treatment goal.  The patient was getting up 4-5 times a night and self treat herself with daily trimethoprim for 2 weeks now gets up once.  Clinically not infected.  Again she wanted no more about her bodily functions.  I was able to redirect to keep her on daily trimethoprim and freshen up her prescription and see her in 1 year.  She agreed  Today Urine culture positive in December negative in January Frequency stable  Patient still gets up 2-4 times at night.  I do not think Myrbetriq worked.  She thought it helped for a few nights.  Again circular conversation.  I did not try to explain things to her further.  She stopped her trimethoprim when she went on the Myrbetriq and again am not going to keep pursuing this.  I offered oxybutynin and she is worried about constipation but decided to try it.  Reassess in 8 weeks.  Next option will be percutaneous tibial nerve stimulation.  I do not think I will be able to reach her treatment goal     PMH: Past Medical History:  Diagnosis Date  . Anxiety   . Arthritis    "may have a touch; all over I guess" (10/11/2017)  . Back pain    "all my  back" (10/11/2017)  . Basal cell carcinoma    "have had many taken off; burned" (10/11/2017)  . Chronic orthostatic hypotension June 2012   with pprior syncopal event,  did not tolelrate florinef trial  . Constipation 08/12/2013  . Depression   . Hyperlipidemia   . Hypertension   . Urinary frequency     Surgical History: Past Surgical History:  Procedure Laterality Date  . ANKLE FRACTURE SURGERY Left 1998  . CATARACT EXTRACTION W/ INTRAOCULAR LENS  IMPLANT, BILATERAL Bilateral   . FRACTURE SURGERY    . IM NAILING TIBIA Left 10/10/2017  . TIBIA IM NAIL INSERTION Left 10/10/2017   Procedure: INTRAMEDULLARY  (IM) NAIL TIBIAL;  Surgeon: Leandrew Koyanagi, MD;  Location: [facility];  Service: Orthopedics;  Laterality: Left;  . WRIST FRACTURE SURGERY Right 2005    Home Medications:  Allergies as of 02/06/2019   No Known Allergies     Medication List       Accurate as of February 06, 2019  2:09 PM. If you have any questions, ask your nurse or doctor.        ASPIRIN 81 PO Take by mouth.   calcium-vitamin D 250-100 MG-UNIT tablet Take 1 tablet by mouth 2 (two) times daily.   diazepam 5 MG tablet Commonly known as: VALIUM Take 5 mg by mouth 2 (two) times daily.   FIBER PO Take by mouth.   Garlic Oil 123XX123 MG Caps Take 1 capsule by mouth daily.   GINGER PO Take by mouth.   Lutein 20 MG Caps Take 20 mg by mouth daily.   mirabegron ER 50 MG Tb24 tablet Commonly known as: MYRBETRIQ Take 1 tablet (50 mg total) by mouth daily.   NON FORMULARY Diet type: NAS   propranolol 60 MG tablet Commonly known as: INDERAL Take 60 mg by mouth every other day.   sennosides-docusate sodium 8.6-50 MG tablet Commonly known as: SENOKOT-S Take 2 tablets by mouth as needed.   trimethoprim 100 MG tablet Commonly known as: TRIMPEX Take 1 tablet (100 mg total) by mouth daily.   vitamin C 1000 MG tablet Take 1,000 mg by mouth daily.   VITAMIN E PO Take 400 Units by mouth daily.       Allergies: No Known Allergies  Family History: Family History  Problem Relation Age of Onset  . Colon cancer Mother   . Heart disease Father   . Kidney disease Father   . Cancer Neg Hx   . Diabetes Neg Hx     Social History:  reports that she has never smoked. She has never used smokeless tobacco. She reports that she does not drink alcohol or use drugs.  ROS: UROLOGY Frequent Urination?: Yes Hard to postpone urination?: No Burning/pain with urination?: No Get up at night to urinate?: Yes Leakage of urine?: No Urine stream starts and stops?: No Trouble starting stream?: No Do you have to strain to  urinate?: No Blood in urine?: No Urinary tract infection?: No Sexually transmitted disease?: No Injury to kidneys or bladder?: No Painful intercourse?: No Weak stream?: No Currently pregnant?: No Vaginal bleeding?: No Last menstrual period?: N  Gastrointestinal Nausea?: No Vomiting?: No Indigestion/heartburn?: No Diarrhea?: No Constipation?: No  Constitutional Fever: No Night sweats?: No Weight loss?: No Fatigue?: No  Skin Skin rash/lesions?: No Itching?: No  Eyes Blurred vision?: No Double vision?: No  Ears/Nose/Throat Sore throat?: No Sinus problems?: No  Hematologic/Lymphatic Swollen glands?: No Easy bruising?: No  Cardiovascular Leg swelling?: No Chest  pain?: No  Respiratory Cough?: No Shortness of breath?: No  Endocrine Excessive thirst?: No  Musculoskeletal Back pain?: No Joint pain?: No  Neurological Headaches?: No Dizziness?: No  Psychologic Depression?: No Anxiety?: No  Physical Exam: BP (!) 195/79   Pulse 68   Ht 5\' 6"  (1.676 m)   Wt 127 lb (57.6 kg)   BMI 20.50 kg/m   Constitutional:  Alert and oriented, No acute distress.  Laboratory Data: Lab Results  Component Value Date   WBC 7.0 10/22/2017   HGB 10.9 (L) 10/22/2017   HCT 33.6 (L) 10/22/2017   MCV 102.1 (H) 10/22/2017   PLT 468 (H) 10/22/2017    Lab Results  Component Value Date   CREATININE 0.80 06/07/2018    No results found for: PSA  No results found for: TESTOSTERONE  No results found for: HGBA1C  Urinalysis    Component Value Date/Time   COLORURINE STRAW (A) 10/18/2017 1720   APPEARANCEUR Cloudy (A) 11/28/2018 1304   LABSPEC 1.003 (L) 10/18/2017 1720   PHURINE 7.0 10/18/2017 1720   GLUCOSEU Negative 11/28/2018 1304   GLUCOSEU NEGATIVE 11/28/2014 1341   HGBUR NEGATIVE 10/18/2017 1720   BILIRUBINUR Negative 11/28/2018 1304   KETONESUR NEGATIVE 10/18/2017 1720   PROTEINUR Negative 11/28/2018 1304   PROTEINUR NEGATIVE 10/18/2017 1720    UROBILINOGEN 0.2 12/16/2016 1448   UROBILINOGEN 0.2 11/28/2014 1341   NITRITE Negative 11/28/2018 1304   NITRITE NEGATIVE 10/18/2017 1720   LEUKOCYTESUR 2+ (A) 11/28/2018 1304    Pertinent Imaging:   Assessment & Plan: Reassess in 8 weeks.  I was very polite with her today and professional but I did not try to explain things further since this has become so repetitive and unproductive  1. Urinary frequency  - Urinalysis, Complete   No follow-ups on file.  Reece Packer, MD  [facility] [address] [place], [place] [address] [phone]

## 2019-02-06 NOTE — Telephone Encounter (Signed)
Patient coming in for wound check today.

## 2019-02-08 ENCOUNTER — Telehealth: Payer: Self-pay | Admitting: Internal Medicine

## 2019-02-08 NOTE — Telephone Encounter (Signed)
Lm on patient's vm that appt was changed to 02/15/19 at 4:30.

## 2019-02-08 NOTE — Telephone Encounter (Signed)
LMTCB. Need to let pt know that her physical has been moved to 02/15/2019 at 4:30 pm.

## 2019-02-08 NOTE — Telephone Encounter (Signed)
Can you postpone [name] visit tomorrow until next week? And keep the 9:30 slot open as I have to have a telephone conference at that time?

## 2019-02-09 ENCOUNTER — Ambulatory Visit: Payer: Medicare Other

## 2019-02-09 ENCOUNTER — Encounter: Payer: Medicare Other | Admitting: Internal Medicine

## 2019-02-15 ENCOUNTER — Ambulatory Visit: Payer: Medicare Other | Admitting: Internal Medicine

## 2019-02-17 ENCOUNTER — Ambulatory Visit: Payer: Medicare Other | Admitting: Internal Medicine

## 2019-02-24 ENCOUNTER — Ambulatory Visit: Payer: Medicare Other | Admitting: Internal Medicine

## 2019-03-02 ENCOUNTER — Other Ambulatory Visit: Payer: Self-pay

## 2019-03-06 ENCOUNTER — Encounter: Payer: Self-pay | Admitting: Internal Medicine

## 2019-03-06 ENCOUNTER — Ambulatory Visit (INDEPENDENT_AMBULATORY_CARE_PROVIDER_SITE_OTHER): Payer: Medicare Other | Admitting: Internal Medicine

## 2019-03-06 ENCOUNTER — Other Ambulatory Visit: Payer: Self-pay

## 2019-03-06 VITALS — BP 140/90 | HR 89 | Temp 98.0°F | Resp 15 | Ht 66.0 in | Wt 129.0 lb

## 2019-03-06 DIAGNOSIS — N3941 Urge incontinence: Secondary | ICD-10-CM | POA: Diagnosis not present

## 2019-03-06 DIAGNOSIS — R636 Underweight: Secondary | ICD-10-CM | POA: Diagnosis not present

## 2019-03-06 DIAGNOSIS — L679 Hair color and hair shaft abnormality, unspecified: Secondary | ICD-10-CM

## 2019-03-06 DIAGNOSIS — Z0001 Encounter for general adult medical examination with abnormal findings: Secondary | ICD-10-CM

## 2019-03-06 DIAGNOSIS — H34231 Retinal artery branch occlusion, right eye: Secondary | ICD-10-CM

## 2019-03-06 DIAGNOSIS — I10 Essential (primary) hypertension: Secondary | ICD-10-CM

## 2019-03-06 DIAGNOSIS — G4762 Sleep related leg cramps: Secondary | ICD-10-CM

## 2019-03-06 DIAGNOSIS — Z0000 Encounter for general adult medical examination without abnormal findings: Secondary | ICD-10-CM

## 2019-03-06 NOTE — Assessment & Plan Note (Signed)
She has suspended Myrbetriq (again)

## 2019-03-06 NOTE — Progress Notes (Signed)
Patient ID: [name], female    DOB: [birth year]  Age: 86 y.o. MRN: [id number]  The patient is here for annual preventive examination and management of other chronic and acute problems.   The risk factors are reflected in the social history.  The roster of all physicians providing medical care to patient - is listed in the Snapshot section of the chart.  Activities of daily living:  The patient is 100% independent in all ADLs: dressing, toileting, feeding as well as independent mobility  Home safety : The patient has smoke detectors in the home. They wear seatbelts.  There are no firearms at home. There is no violence in the home.   There is no risks for hepatitis, STDs or HIV. There is no   history of blood transfusion. They have no travel history to infectious disease endemic areas of the world.  The patient has seen their dentist in the last six month. They have seen their eye doctor in the last year. They admit to slight hearing difficulty with regard to whispered voices and some television programs.  They have deferred audiologic testing in the last year.  They do not  have excessive sun exposure. Discussed the need for sun protection: hats, long sleeves and use of sunscreen if there is significant sun exposure.   Diet: the importance of a healthy diet is discussed. They do have a healthy diet.  The benefits of regular aerobic exercise were discussed. She walks 3 times per week ,  20 minutes.   Depression screen: there are no signs or vegative symptoms of depression- irritability, change in appetite, anhedonia, sadness/tearfullness.  Cognitive assessment: the patient manages all their financial and personal affairs and is actively engaged. They could relate day,date,year and events; recalled 2/3 objects at 3 minutes; performed clock-face test normally.  The following portions of the patient's history were reviewed and updated as appropriate: allergies, current medications, past  family history, past medical history,  past surgical history, past social history  and problem list.  Visual acuity was not assessed per patient preference since she has regular follow up with her ophthalmologist. Hearing and body mass index were assessed and reviewed.   During the course of the visit the patient was educated and counseled about appropriate screening and preventive services including : fall prevention , diabetes screening, nutrition counseling, colorectal cancer screening, and recommended immunizations.    CC: The primary encounter diagnosis was Underweight. Diagnoses of Nocturnal leg cramps, Urinary incontinence, urge, Hair changes, Essential hypertension, 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, and Encounter for preventive health examination were also pertinent to this visit.  Bladder spasms and nocturia:  Saw Urology after last visit,  Was advised to resume once daily Septra for UTI prevention,  And Myrbetriq for urinary frequency, but she has decided to stop all medications and since she has stopped them her nocturia has resolved.  First void is 5 am .   left forehead erythematous and irritated from topical ALDARA prescribed by [place]  For "melanoma" treatment as an alternative to Moh's procedure that patient declined  Worried about her thinning hair  Still worried about her left lower quadrant swelling .  Reviewed her spinal pathology , reassured her that she does not have a hernia   Some ptosis of left eyelid,  Thinks the Aldara is affecting it.  Being treated for dry eye  By ophthalmologist with systane  Eye drops    Not sleeping well some nights unless she uses tylenol  PM   Tolerating propranolol for tremors, using it qod   BP's at home:  135/66.  157/78,  128/65  History [name] has a past medical history of Anxiety, Arthritis, Back pain, Basal cell carcinoma, Chronic orthostatic hypotension (June 2012), Constipation (08/12/2013), Depression, Hyperlipidemia,  Hypertension, and Urinary frequency.   She has a past surgical history that includes IM nailing tibia (Left, 10/10/2017); Fracture surgery; Ankle fracture surgery (Left, 1998); Wrist fracture surgery (Right, 2005); Cataract extraction w/ intraocular lens  implant, bilateral (Bilateral); and Tibia IM nail insertion (Left, 10/10/2017).   Her family history includes Colon cancer in her mother; Heart disease in her father; Kidney disease in her father.She reports that she has never smoked. She has never used smokeless tobacco. She reports that she does not drink alcohol or use drugs.  Outpatient Medications Prior to Visit  Medication Sig Dispense Refill  . Ascorbic Acid (VITAMIN C) 1000 MG tablet Take 1,000 mg by mouth daily.    . ASPIRIN 81 PO Take by mouth.    . calcium-vitamin D 250-100 MG-UNIT tablet Take 1 tablet by mouth 2 (two) times daily.    . diazepam (VALIUM) 5 MG tablet Take 5 mg by mouth 2 (two) times daily.    [name] FIBER PO Take by mouth.    . Garlic Oil 123XX123 MG CAPS Take 1 capsule by mouth daily.     [name] GINKGO BILOBA COMPLEX PO Take 1 capsule by mouth daily.    . imiquimod (ALDARA) 5 % cream APPLY TO LEFT FOREHEAD DAILY MONDAY THRU FRIDAY FOR 12 WEEKS    . loratadine (ALLERGY) 10 MG tablet Take 10 mg by mouth daily as needed for allergies.    . Lutein 20 MG CAPS Take 20 mg by mouth daily.      . Magnesium 400 MG CAPS Take 1 capsule by mouth daily.    . naproxen sodium (ALEVE) 220 MG tablet Take 220 mg by mouth daily as needed.    . NON FORMULARY Diet type: NAS    . propranolol (INDERAL) 60 MG tablet Take 60 mg by mouth every other day.    [name] Propylene Glycol (SYSTANE BALANCE OP) Apply to eye.    . sennosides-docusate sodium (SENOKOT-S) 8.6-50 MG tablet Take 2 tablets by mouth as needed.     [name] VITAMIN E PO Take 400 Units by mouth daily.     [name] trimethoprim (TRIMPEX) 100 MG tablet Take 1 tablet (100 mg total) by mouth daily. (Patient not taking: Reported on 02/06/2019) 30 tablet 11  .  Ginger, Zingiber officinalis, (GINGER PO) Take by mouth.    . mirabegron ER (MYRBETRIQ) 50 MG TB24 tablet Take 1 tablet (50 mg total) by mouth daily. (Patient not taking: Reported on 02/06/2019) 30 tablet 10  . oxybutynin (DITROPAN-XL) 10 MG 24 hr tablet Take 1 tablet (10 mg total) by mouth daily. (Patient not taking: Reported on 03/06/2019) 30 tablet 11   No facility-administered medications prior to visit.    Review of Systems   Patient denies headache, fevers, malaise, unintentional weight loss, skin rash, eye pain, sinus congestion and sinus pain, sore throat, dysphagia,  hemoptysis , cough, dyspnea, wheezing, chest pain, palpitations, orthopnea, edema, abdominal pain, nausea, melena, diarrhea, constipation, flank pain, dysuria, hematuria, urinary  Frequency, nocturia, numbness, tingling, seizures,  Focal weakness, Loss of consciousness,  Tremor, insomnia, depression, anxiety, and suicidal ideation.      Objective:  BP (!) 176/80 (BP Location: Left Arm, Patient Position: Sitting, Cuff Size: Normal)  Pulse 89   Temp 98 F (36.7 C) (Temporal)   Resp 15   Ht 5\' 6"  (1.676 m)   Wt 129 lb (58.5 kg)   SpO2 96%   BMI 20.82 kg/m   Physical Exam   General appearance: alert, cooperative and appears stated age Ears: normal TM's and external ear canals both ears Throat: lips, mucosa, and tongue normal; teeth and gums normal Neck: no adenopathy, no carotid bruit, supple, symmetrical, trachea midline and thyroid not enlarged, symmetric, no tenderness/mass/nodules Back: symmetric, no curvature. ROM normal. No CVA tenderness. Lungs: clear to auscultation bilaterally Heart: regular rate and rhythm, S1, S2 normal, no murmur, click, rub or gallop Abdomen: soft, non-tender; bowel sounds normal; no masses,  no organomegaly Pulses: 2+ and symmetric Skin: Skin color, texture, turgor normal. No rashes or lesions Lymph nodes: Cervical, supraclavicular, and axillary nodes normal.    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   Lab Results  Component Value Date   CHOL 254 (H) 06/09/2016   HDL 60.00 06/09/2016   LDLCALC 174 (H) 06/09/2016   LDLDIRECT 145.6 12/21/2012   TRIG 103.0 06/09/2016   CHOLHDL 4 06/09/2016          Encounter for preventive health examination    age appropriate education and counseling updated,  immunizations addressed, dietary and smoking counseling addressed, most recent labs reviewed from [facility] in October.  I have personally reviewed and have noted:  1) the patient's medical and social history 2) The pt's use of alcohol, tobacco, and illicit drugs 3) The patient's current medications and supplements 4) Functional ability including ADL's, fall risk, home safety risk, hearing and visual impairment 5) Diet and physical activities 6) Evidence for depression or mood disorder 7) The patient's height, weight, and BMI have been recorded in the chart  I have  provided counseling and education based on review of the above      Essential hypertension    Repeat assessment here after 20 minutes has improved  To 140/90.  Given her age, no medications advised unless her readings become more elevated.       Hair changes    Thyroid function was normal.  Recommending  Hair Skin and Nails supplement.   Lab Results  Component Value Date   TSH 1.94 10/26/2018         Urinary incontinence, urge    She has suspended Myrbetriq (again)         I have discontinued Klyn B. Araki "[name]"'s (Ginger, Zingiber officinalis, (GINGER PO)), mirabegron ER, and oxybutynin. I am also having her maintain her Lutein, VITAMIN E PO, Garlic Oil, NON FORMULARY,  sennosides-docusate sodium, diazepam, vitamin C, FIBER PO, ASPIRIN 81 PO, calcium-vitamin D, propranolol, trimethoprim, imiquimod, naproxen sodium, loratadine, Propylene Glycol (SYSTANE BALANCE OP), GINKGO BILOBA COMPLEX PO, and Magnesium.  No orders of the defined types were placed in this encounter.   Medications Discontinued During This Encounter  Medication Reason  . Ginger, Zingiber officinalis, (GINGER PO) Patient has not taken in last 30 days  . oxybutynin (DITROPAN-XL) 10 MG 24 hr tablet   . mirabegron ER (MYRBETRIQ) 50 MG  TB24 tablet     Follow-up: No follow-ups on file.   Crecencio Mc, MD

## 2019-03-06 NOTE — Assessment & Plan Note (Signed)
age appropriate education and counseling updated,  immunizations addressed, dietary and smoking counseling addressed, most recent labs reviewed from [facility] in October.  I have personally reviewed and have noted:  1) the patient's medical and social history 2) The pt's use of alcohol, tobacco, and illicit drugs 3) The patient's current medications and supplements 4) Functional ability including ADL's, fall risk, home safety risk, hearing and visual impairment 5) Diet and physical activities 6) Evidence for depression or mood disorder 7) The patient's height, weight, and BMI have been recorded in the chart  I have  provided counseling and education based on review of the above

## 2019-03-06 NOTE — Patient Instructions (Addendum)
Try cranberry tablets daily to prevent bladder infections  Try 1/2 ounce of pickle juice for leg cramps; be careful with  Magnesium  Because it can cause diarrhea  Try Hair Skin and Nails supplement for your thinning hair  Try using melatonin 5 mg  At dinnertime to help manage your insomnia   Keep monitoring your blood pressure and notify me if your readings stay above 140/80  You can use benadryl (tylenol PM has benadryl in it) for the itching

## 2019-03-06 NOTE — Assessment & Plan Note (Signed)
She is tolerating  a baby aspirin and statin and has had ophthalmology follow up.   Lab Results  Component Value Date   CHOL 254 (H) 06/09/2016   HDL 60.00 06/09/2016   LDLCALC 174 (H) 06/09/2016   LDLDIRECT 145.6 12/21/2012   TRIG 103.0 06/09/2016   CHOLHDL 4 06/09/2016

## 2019-03-06 NOTE — Assessment & Plan Note (Signed)
Repeat assessment here after 20 minutes has improved  To 140/90.  Given her age, no medications advised unless her readings become more elevated.

## 2019-03-06 NOTE — Assessment & Plan Note (Addendum)
Thyroid function was normal.  Recommending  Hair Skin and Nails supplement.   Lab Results  Component Value Date   TSH 1.94 10/26/2018

## 2019-03-07 LAB — TSH: TSH: 2.09 u[IU]/mL (ref 0.35–4.50)

## 2019-03-07 LAB — COMPREHENSIVE METABOLIC PANEL
ALT: 6 U/L (ref 0–35)
AST: 17 U/L (ref 0–37)
Albumin: 4 g/dL (ref 3.5–5.2)
Alkaline Phosphatase: 48 U/L (ref 39–117)
BUN: 16 mg/dL (ref 6–23)
CO2: 29 mEq/L (ref 19–32)
Calcium: 9.5 mg/dL (ref 8.4–10.5)
Chloride: 97 mEq/L (ref 96–112)
Creatinine, Ser: 0.86 mg/dL (ref 0.40–1.20)
GFR: 61.96 mL/min (ref >60.00)
Glucose, Bld: 86 mg/dL (ref 70–99)
Potassium: 4.6 mEq/L (ref 3.5–5.1)
Sodium: 132 mEq/L — ABNORMAL LOW (ref 135–145)
Total Bilirubin: 0.4 mg/dL (ref 0.2–1.2)
Total Protein: 7.1 g/dL (ref 6.0–8.3)

## 2019-03-08 NOTE — Progress Notes (Signed)
Your sodium level is a little low ,  the rest of your labs are normal.  This may be cause of your muscle cramps,  so if you have not treid drinking 1/2 ounce of pickle juice daily,  I would try this home remedy  Regards,   Deborra Medina, MD

## 2019-03-20 ENCOUNTER — Telehealth: Payer: Self-pay | Admitting: Internal Medicine

## 2019-03-20 NOTE — Telephone Encounter (Signed)
Patient is not taking any bp medication right now and her bp is going over 140/80, she thinks is may be coming from another medication. She wants Dr. Derrel Nip to call her please. She

## 2019-03-20 NOTE — Telephone Encounter (Signed)
She wants Dr. Derrel Nip to also know she was taking aldora for her forehead.

## 2019-03-22 ENCOUNTER — Encounter: Payer: Self-pay | Admitting: Emergency Medicine

## 2019-03-22 ENCOUNTER — Other Ambulatory Visit: Payer: Self-pay | Admitting: Internal Medicine

## 2019-03-22 ENCOUNTER — Ambulatory Visit
Admission: EM | Admit: 2019-03-22 | Discharge: 2019-03-22 | Disposition: A | Discharge status: Home / Self Care | Payer: Medicare Other | Attending: Family Medicine | Admitting: Family Medicine

## 2019-03-22 ENCOUNTER — Telehealth: Payer: Self-pay | Admitting: Internal Medicine

## 2019-03-22 ENCOUNTER — Other Ambulatory Visit: Payer: Self-pay

## 2019-03-22 DIAGNOSIS — I10 Essential (primary) hypertension: Secondary | ICD-10-CM

## 2019-03-22 MED ORDER — LOSARTAN POTASSIUM 25 MG PO TABS: 25.0000 mg | ORAL_TABLET | Freq: Every day | ORAL | 0 refills | Status: DC

## 2019-03-22 NOTE — ED Triage Notes (Signed)
Patient states her BP has been elevated and she was advised to call her PCP. She states she called her PCP but has not heard back from them so she came here.

## 2019-03-22 NOTE — Assessment & Plan Note (Signed)
Started on losartan by [name] for systolic 123XX123 at home [id number] in office,  Needs bmet and follow up 7 days

## 2019-03-22 NOTE — Discharge Instructions (Signed)
Medication once daily.  Call Dr. Lupita Dawn office for an appt. You need lab work in 7-10 days.  Take care  Dr. Lacinda Axon

## 2019-03-22 NOTE — ED Provider Notes (Signed)
[facility]    CSN: [id number] Arrival date & time: 03/22/19  1211      History   Chief Complaint Chief Complaint  Patient presents with  . Hypertension   HPI  86 year old female presents with hypertension.  Patient reports that her blood pressure has been elevated for quite a while.  More so over the past few days.  Patient states that she has reached out to her primary care physician but has not received a call back.  She is concerned about her blood pressure being elevated.  She has had readings in the [id number] and [id number] systolic at home.  She is otherwise feeling well.  She believes that recent use of imiquimod may be causing this.  No other reported symptoms.  No other complaints or concerns at this time.  Past Medical History:  Diagnosis Date  . Anxiety   . Arthritis    "may have a touch; all over I guess" (10/11/2017)  . Back pain    "all my back" (10/11/2017)  . Basal cell carcinoma    "have had many taken off; burned" (10/11/2017)  . Chronic orthostatic hypotension June 2012   with pprior syncopal event,  did not tolelrate florinef trial  . Constipation 08/12/2013  . Depression   . Hyperlipidemia   . Hypertension   . Urinary frequency     Patient Active Problem List   Diagnosis Date Noted  . Hair changes 01/10/2019  . Cervical radiculopathy due to degenerative joint disease of spine 04/07/2018  . CVD (cerebrovascular disease) 04/07/2018  . Closed fracture of shaft of left fibula with routine healing   . Closed displaced spiral fracture of shaft of left tibia 10/09/2017  . Tibia/fibula fracture 10/09/2017  . 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 Iliac bone pain 07/29/2015  . Osteoporosis 03/13/2015  . Raynaud phenomenon 02/24/2015  . Hyponatremia 02/09/2015  . Urinary frequency 01/30/2015  . Medicare annual wellness visit, subsequent 01/06/2015  . Encounter for preventive health examination 01/06/2015  . Underweight 06/15/2014  .  Chronic left hip pain 06/13/2014  . Benign essential tremor 10/02/2013  . Constipation 08/12/2013  . Urinary incontinence, urge 05/29/2012  . Nocturnal leg cramps 12/15/2011  . Screening for breast cancer 10/27/2011  . Degeneration of lumbar or lumbosacral intervertebral disc 09/01/2011  . Insomnia 01/07/2011  . Hyperlipidemia LDL goal <130 11/08/2010  . Back pain, sacroiliac 11/08/2010  . Anxiety   . Essential hypertension 09/18/2010    Past Surgical History:  Procedure Laterality Date  . ANKLE FRACTURE SURGERY Left 1998  . CATARACT EXTRACTION W/ INTRAOCULAR LENS  IMPLANT, BILATERAL Bilateral   . FRACTURE SURGERY    . IM NAILING TIBIA Left 10/10/2017  . TIBIA IM NAIL INSERTION Left 10/10/2017   Procedure: INTRAMEDULLARY (IM) NAIL TIBIAL;  Surgeon: Leandrew Koyanagi, MD;  Location: [facility];  Service: Orthopedics;  Laterality: Left;  . WRIST FRACTURE SURGERY Right 2005    OB History   No obstetric history on file.      Home Medications    Prior to Admission medications   Medication Sig Start Date End Date Taking? Authorizing Provider  Ascorbic Acid (VITAMIN C) 1000 MG tablet Take 1,000 mg by mouth daily.   Yes [provider]  ASPIRIN 81 PO Take by mouth.   Yes [provider]  calcium-vitamin D 250-100 MG-UNIT tablet Take 1 tablet by mouth 2 (two) times daily.   Yes [provider]  diazepam (VALIUM) 5 MG  tablet Take 5 mg by mouth 2 (two) times daily. 01/14/18  Yes [provider]  FIBER PO Take by mouth.   Yes [provider]  Garlic Oil 123XX123 MG CAPS Take 1 capsule by mouth daily.    Yes [provider]  Loel Ro COMPLEX PO Take 1 capsule by mouth daily.   Yes [provider]  imiquimod (ALDARA) 5 % cream APPLY TO LEFT FOREHEAD DAILY MONDAY THRU FRIDAY FOR 12 WEEKS 02/21/19  Yes [provider]  loratadine (ALLERGY) 10 MG tablet Take 10 mg by mouth daily as needed for allergies.   Yes [provider]  Lutein 20 MG CAPS Take 20 mg by mouth daily.     Yes [provider]  Magnesium 400 MG CAPS Take 1 capsule by mouth daily.   Yes [provider]  naproxen sodium (ALEVE) 220 MG tablet Take 220 mg by mouth daily as needed.   Yes [provider]  NON FORMULARY Diet type: NAS   Yes [provider]  propranolol (INDERAL) 60 MG tablet Take 60 mg by mouth every other day.   Yes [provider]  Propylene Glycol (SYSTANE BALANCE OP) Apply to eye.   Yes [provider]  sennosides-docusate sodium (SENOKOT-S) 8.6-50 MG tablet Take 2 tablets by mouth as needed.  10/14/17  Yes [provider]  trimethoprim (TRIMPEX) 100 MG tablet Take 1 tablet (100 mg total) by mouth daily. 11/28/18  Yes MacDiarmid, Nicki Reaper, MD  VITAMIN E PO Take 400 Units by mouth daily.    Yes [provider]  losartan (COZAAR) 25 MG tablet Take 1 tablet (25 mg total) by mouth daily. 03/22/19   Coral Spikes, DO    Family History Family History  Problem Relation Age of Onset  . Colon cancer Mother   . Heart disease Father   . Kidney disease Father   . Cancer Neg Hx   . Diabetes Neg Hx     Social History Social History   Tobacco Use  . Smoking status: Never Smoker  . Smokeless tobacco: Never Used  Substance Use Topics  . Alcohol use: Never  . Drug use: Never     Allergies   Patient has no known allergies.   Review of Systems Review of Systems  Constitutional: Negative.   Cardiovascular:       Elevated BP.   Physical Exam Triage Vital Signs ED Triage Vitals  Enc Vitals Group     BP 03/22/19 1238 (!) 195/79     Pulse Rate 03/22/19 1238 67     Resp 03/22/19 1238 18     Temp 03/22/19 1238 98 F (36.7 C)     Temp Source 03/22/19 1238 Oral     SpO2 03/22/19 1238 100 %     Weight 03/22/19 1237 130 lb (59 kg)     Height 03/22/19 1237 5\' 6"  (1.676 m)     Head Circumference --      Peak Flow --      Pain Score 03/22/19 1237 0     Pain  Loc --      Pain Edu? --      Excl. in [place]? --    Updated Vital Signs BP (!) 195/79 (BP Location: Left Arm)   Pulse 67   Temp 98 F (36.7 C) (Oral)   Resp 18   Ht 5\' 6"  (1.676 m)   Wt 59 kg   SpO2 100%   BMI 20.98 kg/m  Visual Acuity Right Eye Distance:   Left Eye Distance:   Bilateral Distance:    Right Eye Near:   Left Eye Near:    Bilateral Near:     Physical Exam Vitals and nursing note reviewed.  Constitutional:      General: She is not in acute distress.    Appearance: Normal appearance. She is not ill-appearing.  HENT:     Head: Normocephalic and atraumatic.  Eyes:     General:        Right eye: No discharge.        Left eye: No discharge.     Conjunctiva/sclera: Conjunctivae normal.  Cardiovascular:     Rate and Rhythm: Normal rate and regular rhythm.     Heart sounds: No murmur.  Pulmonary:     Effort: Pulmonary effort is normal.     Breath sounds: Normal breath sounds. No wheezing, rhonchi or rales.  Neurological:     Mental Status: She is alert.  Psychiatric:        Mood and Affect: Mood normal.        Behavior: Behavior normal.    UC Treatments / Results  Labs (all labs ordered are listed, but only abnormal results are displayed) Labs Reviewed - No data to display  EKG   Radiology No results found.  Procedures Procedures (including critical care time)  Medications Ordered in UC Medications - No data to display  Initial Impression / Assessment and Plan / UC Course  I have reviewed the triage vital signs and the nursing notes.  Pertinent labs & imaging results that were available during my care of the patient were reviewed by me and considered in my medical decision making (see chart for details).    86 year old female presents with elevated blood pressure.  Has known hypertension.  Is in no acute distress today.  Patient seems to be anxious.  Placing on low-dose losartan for hypertension.  I wanted to start amlodipine but patient  states that this makes her feel poorly.  Advised follow-up with primary care physician.  Needs labs rechecked in 7 to 10 days.  Final Clinical Impressions(s) / UC Diagnoses   Final diagnoses:  Essential hypertension     Discharge Instructions     Medication once daily.  Call Dr. Lupita Dawn office for an appt. You need lab work in 7-10 days.  Take care  Dr. Lacinda Axon    ED Prescriptions    Medication Sig Dispense Auth. Provider   losartan (COZAAR) 25 MG tablet Take 1 tablet (25 mg total) by mouth daily. 90 tablet Thersa Salt G, DO     PDMP not reviewed this encounter.   Coral Spikes, [place] 03/22/19 1348

## 2019-03-22 NOTE — Telephone Encounter (Signed)
-----   Message from Coral Spikes, DO sent at 03/22/2019  1:45 PM EDT ----- Saw this patient today for elevated BP. Systolic 123XX123 - [id number] at home and was 195 here. Wanted to start Norvasc but patient states it makes her feel poorly. Started Losartan 25 mg daily. Advised to call for labs in 7-10 days and for follow up with you.  Thanks  [employer] DO

## 2019-03-22 NOTE — Telephone Encounter (Signed)
NEEDS BMET AND BP CHECK ONE WEEK

## 2019-03-23 DIAGNOSIS — H3411 Central retinal artery occlusion, right eye: Secondary | ICD-10-CM | POA: Diagnosis not present

## 2019-03-23 DIAGNOSIS — H3562 Retinal hemorrhage, left eye: Secondary | ICD-10-CM | POA: Diagnosis not present

## 2019-03-23 DIAGNOSIS — H353221 Exudative age-related macular degeneration, left eye, with active choroidal neovascularization: Secondary | ICD-10-CM | POA: Diagnosis not present

## 2019-03-23 NOTE — Telephone Encounter (Signed)
Spoke with [name] and she stated that she went to [facility] yesterday and was prescribed Losartan. Pt stated that the doctor advised her to follow up in seven to ten days to have some blood work done. What blood work needs to be ordered or does pt need to follow up with you to see if the medication is helping her blood pressure?

## 2019-03-23 NOTE — Telephone Encounter (Signed)
Disregard message I just saw the other message stating that she needs a bmet.

## 2019-03-24 NOTE — Telephone Encounter (Signed)
Attempted to call pt. No voicemail.  

## 2019-03-28 NOTE — Telephone Encounter (Signed)
Attempted to call pt. No voicemail.  

## 2019-03-29 NOTE — Telephone Encounter (Signed)
Spoke with pt and she has been scheduled for a nurse and lab appt next week.

## 2019-03-29 NOTE — Telephone Encounter (Signed)
Pt called returning your call 

## 2019-04-03 ENCOUNTER — Other Ambulatory Visit: Payer: Self-pay | Admitting: Internal Medicine

## 2019-04-04 ENCOUNTER — Other Ambulatory Visit: Payer: Self-pay

## 2019-04-04 ENCOUNTER — Ambulatory Visit (INDEPENDENT_AMBULATORY_CARE_PROVIDER_SITE_OTHER): Payer: Medicare Other

## 2019-04-04 ENCOUNTER — Other Ambulatory Visit: Payer: Self-pay | Admitting: Internal Medicine

## 2019-04-04 DIAGNOSIS — I10 Essential (primary) hypertension: Secondary | ICD-10-CM | POA: Diagnosis not present

## 2019-04-04 LAB — BASIC METABOLIC PANEL
BUN: 14 mg/dL (ref 6–23)
CO2: 31 mEq/L (ref 19–32)
Calcium: 9.4 mg/dL (ref 8.4–10.5)
Chloride: 99 mEq/L (ref 96–112)
Creatinine, Ser: 0.83 mg/dL (ref 0.40–1.20)
GFR: 64.54 mL/min (ref >60.00)
Glucose, Bld: 88 mg/dL (ref 70–99)
Potassium: 4.2 mEq/L (ref 3.5–5.1)
Sodium: 134 mEq/L — ABNORMAL LOW (ref 135–145)

## 2019-04-04 MED ORDER — DIAZEPAM 5 MG PO TABS: 5.0000 mg | ORAL_TABLET | Freq: Every day | ORAL | 5 refills | Status: DC | PRN

## 2019-04-04 MED ORDER — LOSARTAN POTASSIUM 50 MG PO TABS: 50.0000 mg | ORAL_TABLET | Freq: Every day | ORAL | 1 refills | Status: DC

## 2019-04-04 NOTE — Telephone Encounter (Signed)
Refill request for Vallium, last seen 03-06-19.  Please advise.

## 2019-04-04 NOTE — Telephone Encounter (Signed)
Historical medication Last OV: 03/06/2019 Next OV: not scheduled

## 2019-04-04 NOTE — Telephone Encounter (Signed)
Medi refilled

## 2019-04-04 NOTE — Progress Notes (Addendum)
Patient is here for a BP check due to bp being high at last visit, as per patient. Was seen by [facility] two weeks ago she was given Losartan 25mg  due to BP being elevated. Currently patients BP is 172/98 and BPM is 60. Ten minutes Prior BP was 214/96 and BPM was 90.  Patient has no complaints of headaches, blurry vision, chest pain, arm pain, light headedness, dizziness, and nor jaw pain.  Dr Derrel Nip was made aware, she instructed for patient to take losartan 50mg  instead of 25mg  daily. Patient was informed and had no further questions. Please see previous note for order.    I have reviewed the above information and agree with above.   Deborra Medina, MD

## 2019-04-10 ENCOUNTER — Ambulatory Visit: Payer: Self-pay | Admitting: Urology

## 2019-04-12 ENCOUNTER — Other Ambulatory Visit: Payer: Self-pay | Admitting: Internal Medicine

## 2019-04-12 DIAGNOSIS — L821 Other seborrheic keratosis: Secondary | ICD-10-CM | POA: Diagnosis not present

## 2019-04-12 DIAGNOSIS — L814 Other melanin hyperpigmentation: Secondary | ICD-10-CM | POA: Diagnosis not present

## 2019-04-12 DIAGNOSIS — D485 Neoplasm of uncertain behavior of skin: Secondary | ICD-10-CM | POA: Diagnosis not present

## 2019-04-12 DIAGNOSIS — D0339 Melanoma in situ of other parts of face: Secondary | ICD-10-CM | POA: Diagnosis not present

## 2019-04-12 DIAGNOSIS — I10 Essential (primary) hypertension: Secondary | ICD-10-CM

## 2019-04-12 DIAGNOSIS — D0471 Carcinoma in situ of skin of right lower limb, including hip: Secondary | ICD-10-CM | POA: Diagnosis not present

## 2019-04-26 ENCOUNTER — Other Ambulatory Visit (INDEPENDENT_AMBULATORY_CARE_PROVIDER_SITE_OTHER): Payer: Medicare Other

## 2019-04-26 ENCOUNTER — Other Ambulatory Visit: Payer: Self-pay

## 2019-04-26 ENCOUNTER — Ambulatory Visit (INDEPENDENT_AMBULATORY_CARE_PROVIDER_SITE_OTHER): Payer: Medicare Other

## 2019-04-26 ENCOUNTER — Ambulatory Visit: Payer: Medicare Other

## 2019-04-26 VITALS — BP 170/90 | HR 87

## 2019-04-26 DIAGNOSIS — I10 Essential (primary) hypertension: Secondary | ICD-10-CM | POA: Diagnosis not present

## 2019-04-26 NOTE — Addendum Note (Signed)
Addended by: Leeanne Rio on: 04/26/2019 02:27 PM   Modules accepted: Orders

## 2019-04-26 NOTE — Progress Notes (Addendum)
Patient is here for a BP check due to bp being high at last nurse visit, as per patient. she is currently on losartan 50mg  due to BP being elevated. Currently patients BP is 170/90 and BPM is 57. Ten minutes Prior BP was 172/86 and BPM was 62.  Patient has no complaints of headaches, blurry vision, chest pain, arm pain, light headedness, dizziness, and nor jaw pain.  Dr Derrel Nip was made aware. Please see previous note for order.    Patient needs to increase amlodipine to 5 mg and continue losartan 50 mg .  Return in one week after med change

## 2019-04-27 LAB — BASIC METABOLIC PANEL
BUN: 16 mg/dL (ref 6–23)
CO2: 30 mEq/L (ref 19–32)
Calcium: 9.5 mg/dL (ref 8.4–10.5)
Chloride: 96 mEq/L (ref 96–112)
Creatinine, Ser: 0.88 mg/dL (ref 0.40–1.20)
GFR: 60.32 mL/min (ref >60.00)
Glucose, Bld: 90 mg/dL (ref 70–99)
Potassium: 4.8 mEq/L (ref 3.5–5.1)
Sodium: 131 mEq/L — ABNORMAL LOW (ref 135–145)

## 2019-04-28 ENCOUNTER — Other Ambulatory Visit: Payer: Self-pay | Admitting: Internal Medicine

## 2019-04-28 MED ORDER — LOSARTAN POTASSIUM 100 MG PO TABS: 100.0000 mg | ORAL_TABLET | Freq: Every day | ORAL | 1 refills | Status: DC

## 2019-04-28 MED ORDER — AMLODIPINE BESYLATE 2.5 MG PO TABS: 2.5000 mg | ORAL_TABLET | Freq: Every day | ORAL | 3 refills | Status: DC

## 2019-05-03 ENCOUNTER — Telehealth: Payer: Self-pay

## 2019-05-03 NOTE — Telephone Encounter (Signed)
Left message for patient to return call back. Please see Dr Derrel Nip message. Patient is to stay on 50 mg losartan and up amlodipine dosage to 5 mg daily for BP.

## 2019-05-03 NOTE — Progress Notes (Signed)
Left message for patient to return call back. Please see Dr Derrel Nip message below. Patient is to stay on 50 mg losartan and up amlodipine dosage to 5 mg daily for BP.

## 2019-05-04 NOTE — Telephone Encounter (Signed)
FYI

## 2019-05-04 NOTE — Telephone Encounter (Signed)
Pt called returning your call 

## 2019-05-04 NOTE — Telephone Encounter (Signed)
Patient doesn't see a reason as to why she's on two BP medication. She stated she will comply.

## 2019-05-11 DIAGNOSIS — H353221 Exudative age-related macular degeneration, left eye, with active choroidal neovascularization: Secondary | ICD-10-CM | POA: Diagnosis not present

## 2019-05-11 DIAGNOSIS — H3411 Central retinal artery occlusion, right eye: Secondary | ICD-10-CM | POA: Diagnosis not present

## 2019-05-15 ENCOUNTER — Other Ambulatory Visit: Payer: Self-pay

## 2019-05-15 MED ORDER — AMLODIPINE BESYLATE 5 MG PO TABS: 5.0000 mg | ORAL_TABLET | Freq: Every day | ORAL | 1 refills | Status: DC

## 2019-05-15 NOTE — Telephone Encounter (Signed)
Medication has been reordered and new dosage was sent in per patient request. She ran out of the 2.5 because she has been taking 2 at a time. Per PCP note she was to upped to 5mg  of Norvasc. Medication has been refilled.

## 2019-05-16 NOTE — Telephone Encounter (Signed)
Patient was informed on the medication dosages again. She wanted to make sure she was taking them correctly.

## 2019-05-16 NOTE — Telephone Encounter (Signed)
Pt called wanting to talk to you again about her medication

## 2019-05-25 ENCOUNTER — Ambulatory Visit (INDEPENDENT_AMBULATORY_CARE_PROVIDER_SITE_OTHER): Payer: Medicare Other

## 2019-05-25 VITALS — Ht 66.0 in | Wt 130.0 lb

## 2019-05-25 DIAGNOSIS — Z0000 Encounter for general adult medical examination without abnormal findings: Secondary | ICD-10-CM | POA: Diagnosis not present

## 2019-05-25 NOTE — Progress Notes (Addendum)
Subjective:   [name] is a 86 y.o. female who presents for Medicare Annual (Subsequent) preventive examination.  Review of Systems:  No ROS.  Medicare Wellness Virtual Visit.  Visual/audio telehealth visit, UTA vital signs. Ht/Wt provided.    See social history for additional risk factors.   Cardiac Risk Factors include: advanced age (>42men, >21 women);hypertension     Objective:     Vitals: Ht 5\' 6"  (1.676 m)   Wt 130 lb (59 kg)   BMI 20.98 kg/m   Body mass index is 20.98 kg/m.   Monitors BP daily. Patient has not yet taken today.   Advanced Directives 05/25/2019 12/04/2017 11/08/2017 10/20/2017 10/14/2017 10/11/2017 10/10/2017  Does Patient Have a Medical Advance Directive? Yes Yes Yes Yes Yes No Yes  Type of Paramedic of [place];Living will [place];Living will Living will Living will [place]  Does patient want to make changes to medical advance directive? No - Patient declined - No - Patient declined No - Patient declined No - Patient declined No - Patient declined -  Copy of [place] in Chart? Yes - validated most recent copy scanned in chart (See row information) - - - Yes - Yes  Would patient like information on creating a medical advance directive? - - No - Patient declined - No - Patient declined No - Patient declined -    Tobacco Social History   Tobacco Use  Smoking Status Never Smoker  Smokeless Tobacco Never Used     Counseling given: Not Answered   Clinical Intake:  Pre-visit preparation completed: Yes        Diabetes: No  How often do you need to have someone help you when you read instructions, pamphlets, or other written materials from your doctor or pharmacy?: 1 - Never  Interpreter Needed?: No     Past Medical History:  Diagnosis Date  . Anxiety   . Arthritis    "may have a touch; all over I guess" (10/11/2017)   . Back pain    "all my back" (10/11/2017)  . Basal cell carcinoma    "have had many taken off; burned" (10/11/2017)  . Chronic orthostatic hypotension June 2012   with pprior syncopal event,  did not tolelrate florinef trial  . Constipation 08/12/2013  . Depression   . Hyperlipidemia   . Hypertension   . Urinary frequency    Past Surgical History:  Procedure Laterality Date  . ANKLE FRACTURE SURGERY Left 1998  . CATARACT EXTRACTION W/ INTRAOCULAR LENS  IMPLANT, BILATERAL Bilateral   . FRACTURE SURGERY    . IM NAILING TIBIA Left 10/10/2017  . TIBIA IM NAIL INSERTION Left 10/10/2017   Procedure: INTRAMEDULLARY (IM) NAIL TIBIAL;  Surgeon: Leandrew Koyanagi, MD;  Location: [facility];  Service: Orthopedics;  Laterality: Left;  . WRIST FRACTURE SURGERY Right 2005   Family History  Problem Relation Age of Onset  . Colon cancer Mother   . Heart disease Father   . Kidney disease Father   . Cancer Neg Hx   . Diabetes Neg Hx    Social History   Socioeconomic History  . Marital status: Widowed    Spouse name: Not on file  . Number of children: Not on file  . Years of education: Not on file  . Highest education level: Not on file  Occupational History  . 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 Smoking status: Never Smoker  . Smokeless tobacco: Never Used  Substance and Sexual Activity  . Alcohol use: Never  . Drug use: Never  . Sexual activity: Not Currently  Other Topics Concern  . Not on file  Social History Narrative  . Not on file   Social Determinants of Health   Financial Resource Strain:   . Difficulty of Paying Living Expenses:   Food Insecurity:   . Worried About Charity fundraiser in the Last Year:   . Arboriculturist in the Last Year:   Transportation Needs:   . Film/video editor (Medical):   [name] Lack of Transportation (Non-Medical):   Physical Activity:   . Days of Exercise per Week:   . Minutes of  Exercise per Session:   Stress:   . Feeling of Stress :   Social Connections:   . Frequency of Communication with Friends and Family:   . Frequency of Social Gatherings with Friends and Family:   . Attends Religious Services:   . Active Member of Clubs or Organizations:   . Attends Archivist Meetings:   [name] Marital Status:     Outpatient Encounter Medications as of 05/25/2019  Medication Sig  . amLODipine (NORVASC) 5 MG tablet Take 1 tablet (5 mg total) by mouth daily.  . Ascorbic Acid (VITAMIN C) 1000 MG tablet Take 1,000 mg by mouth daily.  . ASPIRIN 81 PO Take by mouth.  . diazepam (VALIUM) 5 MG tablet Take 1 tablet (5 mg total) by mouth daily as needed for anxiety or muscle spasms.  [name] FIBER PO Take by mouth.  . Garlic Oil 123XX123 MG CAPS Take 1 capsule by mouth daily.   [name] GINKGO BILOBA COMPLEX PO Take 1 capsule by mouth daily.  . imiquimod (ALDARA) 5 % cream APPLY TO LEFT FOREHEAD DAILY MONDAY THRU FRIDAY FOR 12 WEEKS  . loratadine (ALLERGY) 10 MG tablet Take 10 mg by mouth daily as needed for allergies.  [name] losartan (COZAAR) 100 MG tablet Take 1 tablet (100 mg total) by mouth daily.  . Lutein 20 MG CAPS Take 20 mg by mouth daily.    . Magnesium 400 MG CAPS Take 1 capsule by mouth daily.  . naproxen sodium (ALEVE) 220 MG tablet Take 220 mg by mouth daily as needed.  . NON FORMULARY Diet type: NAS  . propranolol (INDERAL) 60 MG tablet Take 60 mg by mouth every other day.  [name] Propylene Glycol (SYSTANE BALANCE OP) Apply to eye.  . sennosides-docusate sodium (SENOKOT-S) 8.6-50 MG tablet Take 2 tablets by mouth as needed.   . trimethoprim (TRIMPEX) 100 MG tablet Take 1 tablet (100 mg total) by mouth daily.  [name] VITAMIN E PO Take 400 Units by mouth daily.   . calcium-vitamin D 250-100 MG-UNIT tablet Take 1 tablet by mouth 2 (two) times daily.   No facility-administered encounter medications on file as of 05/25/2019.    Activities of Daily Living In your present state of health,  do you have any difficulty performing the following activities: 05/25/2019  Hearing? N  Vision? Y  Comment R eye blindness  Difficulty concentrating or making decisions? N  Walking or climbing stairs? N  Dressing or bathing? N  Doing errands, shopping? N  Preparing Food and eating ? N  Using the Toilet? N  In the past six months, have you accidently leaked urine? N  Do you have problems with loss  of bowel control? N  Managing your Medications? N  Managing your Finances? N  Housekeeping or managing your Housekeeping? N  Some recent data might be hidden    Patient Care Team: Crecencio Mc, MD as PCP - General (Internal Medicine)    Assessment:   This is a routine wellness examination for [name].  Nurse connected with patient 05/25/19 at 11:30 AM EDT by a telephone enabled telemedicine application, patient does not have access to video or difficulty with video and verified that I am speaking with the correct person using two identifiers. Patient stated full name and DOB. Patient gave permission to continue with virtual visit. Patient's location was at home and Nurse's location was at [facility] office.   Patient is alert and oriented x3. Patient denies difficulty focusing or concentrating. Patient likes to read the Bible and newspaper for brain health.  Health Maintenance Due: -Tdap vaccine- discussed; to be completed with doctor in visit or local pharmacy.   -Covid vaccine- declined. See completed HM at the end of note.   Eye: Visual acuity not assessed. Virtual visit. Followed by their ophthalmologist. R eye blindness. Wears glasses.   Dental: Dentures- yes  Hearing: Demonstrates normal hearing during visit.  Safety:  Patient feels safe at home- yes Patient does have smoke detectors at home- yes Patient does wear sunscreen or protective clothing when in direct sunlight - yes Patient does wear seat belt when in a moving vehicle - yes Patient drives- yes Adequate lighting  in walkways free from debris- yes Grab bars and handrails used as appropriate- yes Ambulates with an assistive device- no  Social: Alcohol intake - no     Smoking history- never   Smokers in home? none Illicit drug use? none  Medication: Taking as directed and without issues.  Self managed - yes   Covid-19: Precautions and sickness symptoms discussed. Wears mask, social distancing, hand hygiene as appropriate.   Activities of Daily Living Patient denies needing assistance with: household chores, feeding themselves, getting from bed to chair, getting to the toilet, bathing/showering, dressing, managing money, or preparing meals.   Discussed the importance of a healthy diet, water intake and the benefits of aerobic exercise.   Physical activity- active outdoors in flower garden and around the home. Stays active.   Diet:  Regular Water: fair  Caffeine: 1.5 cups of coffee  Other Providers Patient Care Team: Crecencio Mc, MD as PCP - General (Internal Medicine)  Exercise Activities and Dietary recommendations Current Exercise Habits: Home exercise routine  Goals      Patient Stated   . I try to drink 32 ounces of water daily (pt-stated)     Stay hydrated       Fall Risk Fall Risk  05/25/2019 03/06/2019 01/10/2019 11/14/2018 06/16/2016  Falls in the past year? 0 0 0 0 Yes  Number falls in past yr: 0 - - - 1  Injury with Fall? - - - - No  Follow up Falls evaluation completed Falls evaluation completed Falls evaluation completed - -   Timed Get Up and Go performed: no, virtual visit  Depression Screen PHQ 2/9 Scores 05/25/2019 11/14/2018 06/16/2016 01/04/2015  PHQ - 2 Score 0 0 0 0  PHQ- 9 Score - - 7 -     Cognitive Function     6CIT Screen 05/25/2019  What Year? 0 points  What month? 0 points  What time? 0 points  Count back from 20 0 points  Months in reverse 0  points     There is no immunization history on file for this patient.  Screening Tests Health  Maintenance  Topic Date Due  . COVID-19 Vaccine (1) 06/10/2019 (Originally 12/10/1942)  . TETANUS/TDAP  05/24/2020 (Originally 12/11/1945)  . DEXA SCAN  Completed  . INFLUENZA VACCINE  Discontinued  . PNA vac Low Risk Adult  Discontinued      Plan:   Keep all routine maintenance appointments.   Medicare Attestation I have personally reviewed: The patient's medical and social history Their use of alcohol, tobacco or illicit drugs Their current medications and supplements The patient's functional ability including ADLs,fall risks, home safety risks, cognitive, and hearing and visual impairment Diet and physical activities Evidence for depression   I have reviewed and discussed with patient certain preventive protocols, quality metrics, and best practice recommendations.      OBrien-Blaney, Denisa L, LPN  [id number]    I have reviewed the above information and agree with above.   Deborra Medina, MD

## 2019-05-25 NOTE — Patient Instructions (Addendum)
  [name] , Thank you for taking time to come for your Medicare Wellness Visit. I appreciate your ongoing commitment to your health goals. Please review the following plan we discussed and let me know if I can assist you in the future.   These are the goals we discussed: Goals      Patient Stated   . I try to drink 32 ounces of water daily (pt-stated)     Stay hydrated       This is a list of the screening recommended for you and due dates:  Health Maintenance  Topic Date Due  . COVID-19 Vaccine (1) 06/10/2019*  . Tetanus Vaccine  05/24/2020*  . DEXA scan (bone density measurement)  Completed  . Flu Shot  Discontinued  . Pneumonia vaccines  Discontinued  *Topic was postponed. The date shown is not the original due date.

## 2019-05-31 ENCOUNTER — Other Ambulatory Visit: Payer: Self-pay

## 2019-06-02 ENCOUNTER — Telehealth: Payer: Self-pay | Admitting: Internal Medicine

## 2019-06-02 ENCOUNTER — Encounter: Payer: Self-pay | Admitting: Internal Medicine

## 2019-06-02 ENCOUNTER — Ambulatory Visit (INDEPENDENT_AMBULATORY_CARE_PROVIDER_SITE_OTHER): Payer: Medicare Other | Admitting: Internal Medicine

## 2019-06-02 ENCOUNTER — Other Ambulatory Visit: Payer: Self-pay

## 2019-06-02 VITALS — BP 136/72 | HR 60 | Temp 97.3°F | Resp 15 | Ht 66.0 in | Wt 129.2 lb

## 2019-06-02 DIAGNOSIS — I10 Essential (primary) hypertension: Secondary | ICD-10-CM | POA: Diagnosis not present

## 2019-06-02 DIAGNOSIS — R140 Abdominal distension (gaseous): Secondary | ICD-10-CM

## 2019-06-02 DIAGNOSIS — Z1273 Encounter for screening for malignant neoplasm of ovary: Secondary | ICD-10-CM | POA: Diagnosis not present

## 2019-06-02 DIAGNOSIS — R1904 Left lower quadrant abdominal swelling, mass and lump: Secondary | ICD-10-CM

## 2019-06-02 LAB — COMPREHENSIVE METABOLIC PANEL
ALT: 9 U/L (ref 0–35)
AST: 17 U/L (ref 0–37)
Albumin: 4.1 g/dL (ref 3.5–5.2)
Alkaline Phosphatase: 40 U/L (ref 39–117)
BUN: 13 mg/dL (ref 6–23)
CO2: 31 mEq/L (ref 19–32)
Calcium: 9.3 mg/dL (ref 8.4–10.5)
Chloride: 97 mEq/L (ref 96–112)
Creatinine, Ser: 0.82 mg/dL (ref 0.40–1.20)
GFR: 65.42 mL/min (ref >60.00)
Glucose, Bld: 100 mg/dL — ABNORMAL HIGH (ref 70–99)
Potassium: 4.1 mEq/L (ref 3.5–5.1)
Sodium: 133 mEq/L — ABNORMAL LOW (ref 135–145)
Total Bilirubin: 0.4 mg/dL (ref 0.2–1.2)
Total Protein: 6.6 g/dL (ref 6.0–8.3)

## 2019-06-02 MED ORDER — AMLODIPINE BESYLATE 10 MG PO TABS: 10.0000 mg | ORAL_TABLET | Freq: Every day | ORAL | 1 refills | Status: DC

## 2019-06-02 NOTE — Patient Instructions (Signed)
Increase amlodipine to 10 mg daily and stop losartan for now  If BP is persistently > 140/80,  Let me know   I have ordered an ultrasound of your pelvis to rule out ovarian mass

## 2019-06-02 NOTE — Assessment & Plan Note (Signed)
She refuses to take 2 medications.  Will increase amlodipine to 10 mg and dc losartan.  Patient understands goal is 130/80

## 2019-06-02 NOTE — Progress Notes (Signed)
Subjective:  Patient ID: [name], female    DOB: 1926/07/05  Age: 86 y.o. MRN: [id number]  CC: The primary encounter diagnosis was Screening for ovarian cancer. Diagnoses of Abdominal swelling, left lower quadrant, Abdominal bloating, and Essential hypertension were also pertinent to this visit.  HPI [name] presents for follow up on hypertension and abdominal swelling  Patient is taking her medications as prescribed and notes no adverse effects.  Home BP readings have been done about twice daily  and are  generally labile .  She is avoiding added salt in her diet and walking regularly about 3 times per week for exercise  . She is taking amlodipine 5 mg losartan 100 mg but refuses to take two medications  Lower abdominal distension.  She reports increased distension of lower abdominal abdomen despite management of constipation.  Clothes not fitting.  Weight has not changed but her pants are no longer fitting.   Also worried about her right breast being larger than her left.    Outpatient Medications Prior to Visit  Medication Sig Dispense Refill  . Ascorbic Acid (VITAMIN C) 1000 MG tablet Take 1,000 mg by mouth daily.    . ASPIRIN 81 PO Take by mouth.    . calcium-vitamin D 250-100 MG-UNIT tablet Take 1 tablet by mouth 2 (two) times daily.    . diazepam (VALIUM) 5 MG tablet Take 1 tablet (5 mg total) by mouth daily as needed for anxiety or muscle spasms. 30 tablet 5  . FIBER PO Take by mouth.    . Garlic Oil 123XX123 MG CAPS Take 1 capsule by mouth daily.     [name] GINKGO BILOBA COMPLEX PO Take 1 capsule by mouth daily.    . imiquimod (ALDARA) 5 % cream APPLY TO LEFT FOREHEAD DAILY MONDAY THRU FRIDAY FOR 12 WEEKS    . loratadine (ALLERGY) 10 MG tablet Take 10 mg by mouth daily as needed for allergies.    . Lutein 20 MG CAPS Take 20 mg by mouth daily.      . Magnesium 400 MG CAPS Take 1 capsule by mouth daily.    . naproxen sodium (ALEVE) 220 MG tablet Take 220 mg by mouth daily  as needed.    . NON FORMULARY Diet type: NAS    . propranolol (INDERAL) 60 MG tablet Take 60 mg by mouth every other day.    [name] Propylene Glycol (SYSTANE BALANCE OP) Apply to eye.    . sennosides-docusate sodium (SENOKOT-S) 8.6-50 MG tablet Take 2 tablets by mouth as needed.     . trimethoprim (TRIMPEX) 100 MG tablet Take 1 tablet (100 mg total) by mouth daily. 30 tablet 11  . VITAMIN E PO Take 400 Units by mouth daily.     [name] amLODipine (NORVASC) 5 MG tablet Take 1 tablet (5 mg total) by mouth daily. 90 tablet 1  . losartan (COZAAR) 100 MG tablet Take 1 tablet (100 mg total) by mouth daily. 90 tablet 1   No facility-administered medications prior to visit.    Review of Systems;  Patient denies headache, fevers, malaise, unintentional weight loss, skin rash, eye pain, sinus congestion and sinus pain, sore throat, dysphagia,  hemoptysis , cough, dyspnea, wheezing, chest pain, palpitations, orthopnea, edema, abdominal pain, nausea, melena, diarrhea, constipation, flank pain, dysuria, hematuria, urinary  Frequency, nocturia, numbness, tingling, seizures,  Focal weakness, Loss of consciousness,  Tremor, insomnia, depression, anxiety, and suicidal ideation.      Objective:  BP 136/72 (BP  Location: Left Arm, Patient Position: Sitting, Cuff Size: Normal)   Pulse 60   Temp (!) 97.3 F (36.3 C) (Temporal)   Resp 15   Ht 5\' 6"  (1.676 m)   Wt 129 lb 3.2 oz (58.6 kg)   SpO2 99%   BMI 20.85 kg/m   BP Readings from Last 3 Encounters:  06/02/19 136/72  04/26/19 (!) 170/90  04/04/19 (!) 172/98    Wt Readings from Last 3 Encounters:  06/02/19 129 lb 3.2 oz (58.6 kg)  05/25/19 130 lb (59 kg)  03/22/19 130 lb (59 kg)   General appearance: alert, cooperative and appears stated age Head: Normocephalic, without obvious abnormality, atraumatic Eyes: conjunctivae/corneas clear. PERRL, EOM's intact. Fundi benign. Ears: normal TM's and external ear canals both ears Nose: Nares normal. Septum  midline. Mucosa normal. No drainage or sinus tenderness. Throat: lips, mucosa, and tongue normal; teeth and gums normal Neck: no adenopathy, no carotid bruit, no JVD, supple, symmetrical, trachea midline and thyroid not enlarged, symmetric, no tenderness/mass/nodules Lungs: clear to auscultation bilaterally Breasts: normal appearance, no masses or tenderness Heart: regular rate and rhythm, S1, S2 normal, no murmur, click, rub or gallop Abdomen: distended and full,  soft, non-tender; bowel sounds normal; no masses,  no organomegaly Extremities: extremities normal, atraumatic, no cyanosis or edema Pulses: 2+ and symmetric Skin: Skin color, texture, turgor normal. No rashes or lesions Neurologic: Alert and oriented X 3, normal strength and tone. Normal symmetric reflexes. Normal coordination and gait.     No results found for: HGBA1C  Lab Results  Component Value Date   CREATININE 0.88 04/26/2019   CREATININE 0.83 04/04/2019   CREATININE 0.86 03/06/2019    Lab Results  Component Value Date   WBC 7.0 10/22/2017   HGB 10.9 (L) 10/22/2017   HCT 33.6 (L) 10/22/2017   PLT 468 (H) 10/22/2017   GLUCOSE 90 04/26/2019   CHOL 254 (H) 06/09/2016   TRIG 103.0 06/09/2016   HDL 60.00 06/09/2016   LDLDIRECT 145.6 12/21/2012   LDLCALC 174 (H) 06/09/2016   ALT 6 03/06/2019   AST 17 03/06/2019   NA 131 (L) 04/26/2019   K 4.8 04/26/2019   CL 96 04/26/2019   CREATININE 0.88 04/26/2019   BUN 16 04/26/2019   CO2 30 04/26/2019   TSH 2.09 03/06/2019   INR 0.99 11/30/2016    No results found.  Assessment & Plan:   Problem List Items Addressed This Visit      Unprioritized   Abdominal swelling, left lower quadrant    She was referred to DR [facility] in Nov 2020 for evaluation of what I felt was NOT A HERNIA (also had a CT abd May 2020 for same ordered by prior PCP) and he agreed that there was no hernia present . She remains uncomfortable in the LLQ and feels she cannot get into her clothes  because her abdominal girth has increased.  She is moving her bowels on a daily     She has a remote history of endometriosis   And early menopause. Ordering pelvic US and CA 125 as first stop       Essential hypertension    She refuses to take 2 medications.  Will increase amlodipine to 10 mg and dc losartan.  Patient understands goal is 130/80      Relevant Medications   amLODipine (NORVASC) 10 MG tablet    Other Visit Diagnoses    Screening for ovarian cancer    -  Primary   Relevant  Orders   CA 125   Abdominal bloating       Relevant Orders   CA 125   US Pelvic Complete With Transvaginal      I have discontinued [name] Hoyos "[name]"'s losartan. I have also changed her amLODipine. Additionally, I am having her maintain her Lutein, VITAMIN E PO, Garlic Oil, NON FORMULARY, sennosides-docusate sodium, vitamin C, FIBER PO, ASPIRIN 81 PO, calcium-vitamin D, propranolol, trimethoprim, imiquimod, naproxen sodium, loratadine, Propylene Glycol (SYSTANE BALANCE OP), GINKGO BILOBA COMPLEX PO, Magnesium, and diazepam.  Meds ordered this encounter  Medications  . amLODipine (NORVASC) 10 MG tablet    Sig: Take 1 tablet (10 mg total) by mouth daily. KEEP ON FILE FOR FUTURE REFILLS; note dose increase    Dispense:  90 tablet    Refill:  1    Medications Discontinued During This Encounter  Medication Reason  . amLODipine (NORVASC) 5 MG tablet   . losartan (COZAAR) 100 MG tablet     Follow-up: No follow-ups on file.   Crecencio Mc, MD

## 2019-06-02 NOTE — Telephone Encounter (Signed)
Left pt vm to call ofc to sch [place].

## 2019-06-02 NOTE — Assessment & Plan Note (Addendum)
She was referred to DR [facility] in Nov 2020 for evaluation of what I felt was NOT A HERNIA (also had a CT abd May 2020 for same ordered by prior PCP) and he agreed that there was no hernia present . She remains uncomfortable in the LLQ and feels she cannot get into her clothes because her abdominal girth has increased.  She is moving her bowels on a daily     She has a remote history of endometriosis   And early menopause. Ordering pelvic US and CA 125 as first stop

## 2019-06-02 NOTE — Telephone Encounter (Signed)
Pt blood pressure readings  8:20 am 05/26 125/72  08:25 am 05/27 114/69 03:46 pm 116/55  08:30 am 05/28 125/67

## 2019-06-06 LAB — CA 125: CA 125: 4 U/mL (ref <35)

## 2019-06-06 NOTE — Telephone Encounter (Signed)
Spoke with pt to let her know that the bp readings are at goal. Pt gave a verbal understanding.

## 2019-06-06 NOTE — Progress Notes (Signed)
Her labs are normal,  but she still should go through with the ultrasound(pelvic) to rule out ovarian and uterine abnormalities that might be causing her lower abdominal discomfort and bloating

## 2019-06-06 NOTE — Telephone Encounter (Signed)
BP is at goal. 

## 2019-06-08 DIAGNOSIS — H3411 Central retinal artery occlusion, right eye: Secondary | ICD-10-CM | POA: Diagnosis not present

## 2019-06-08 DIAGNOSIS — H353221 Exudative age-related macular degeneration, left eye, with active choroidal neovascularization: Secondary | ICD-10-CM | POA: Diagnosis not present

## 2019-06-13 ENCOUNTER — Ambulatory Visit
Admission: RE | Admit: 2019-06-13 | Discharge: 2019-06-13 | Disposition: A | Discharge status: Home / Self Care | Payer: Medicare Other | Source: Ambulatory Visit | Attending: Internal Medicine | Admitting: Internal Medicine

## 2019-06-13 ENCOUNTER — Other Ambulatory Visit: Payer: Self-pay

## 2019-06-13 DIAGNOSIS — R140 Abdominal distension (gaseous): Secondary | ICD-10-CM

## 2019-06-15 ENCOUNTER — Telehealth: Payer: Self-pay | Admitting: Internal Medicine

## 2019-06-15 NOTE — Telephone Encounter (Signed)
Pt called returning a call from [place]

## 2019-06-15 NOTE — Telephone Encounter (Signed)
See result note message 

## 2019-06-27 DIAGNOSIS — H353221 Exudative age-related macular degeneration, left eye, with active choroidal neovascularization: Secondary | ICD-10-CM | POA: Diagnosis not present

## 2019-06-27 DIAGNOSIS — H3411 Central retinal artery occlusion, right eye: Secondary | ICD-10-CM | POA: Diagnosis not present

## 2019-07-01 DIAGNOSIS — Z7189 Other specified counseling: Secondary | ICD-10-CM | POA: Diagnosis not present

## 2019-07-01 DIAGNOSIS — Z23 Encounter for immunization: Secondary | ICD-10-CM | POA: Diagnosis not present

## 2019-07-26 DIAGNOSIS — H3411 Central retinal artery occlusion, right eye: Secondary | ICD-10-CM | POA: Diagnosis not present

## 2019-07-28 ENCOUNTER — Encounter: Payer: Self-pay | Admitting: Internal Medicine

## 2019-07-28 ENCOUNTER — Ambulatory Visit (INDEPENDENT_AMBULATORY_CARE_PROVIDER_SITE_OTHER): Payer: Medicare Other | Admitting: Internal Medicine

## 2019-07-28 ENCOUNTER — Other Ambulatory Visit: Payer: Self-pay

## 2019-07-28 DIAGNOSIS — I10 Essential (primary) hypertension: Secondary | ICD-10-CM | POA: Diagnosis not present

## 2019-07-28 DIAGNOSIS — R1904 Left lower quadrant abdominal swelling, mass and lump: Secondary | ICD-10-CM | POA: Diagnosis not present

## 2019-07-28 MED ORDER — AMLODIPINE BESYLATE 5 MG PO TABS: 5.0000 mg | ORAL_TABLET | Freq: Every day | ORAL | 1 refills | Status: DC

## 2019-07-28 MED ORDER — LOSARTAN POTASSIUM 50 MG PO TABS: 50.0000 mg | ORAL_TABLET | Freq: Every day | ORAL | 3 refills | Status: DC

## 2019-07-28 NOTE — Patient Instructions (Addendum)
The 10 mg dose of amlodipine  Is causing swelling in your feet   Let's reduce the amlodipine to 5 mg in the morning   (use 1/2 tablet)  And resume losartan at 50 mg daily in the evening     For your back pain,  You should try "Relief Factor"  As televised on TV.  It will not affect your blood pressure   You can also try combining Aleve twice daily with 1000 mg tylenol twice daily .  This is safe to do every day  You can take a stool softener with one Sennakot  At night  Time.

## 2019-07-28 NOTE — Progress Notes (Signed)
Subjective:  Patient ID: [name], female    DOB: [birth year]  Age: 86 y.o. MRN: [id number]  CC: Diagnoses of Essential hypertension and Abdominal swelling, left lower quadrant were pertinent to this visit.  HPI [name] presents for follow up on chronic conditions  This visit occurred during the SARS-CoV-2 public health emergency.  Safety protocols were in place, including screening questions prior to the visit, additional usage of staff PPE, and extensive cleaning of exam room while observing appropriate contact time as indicated for disinfecting solutions.    Patient has received  The [employer]  COVID 19 vaccine without complications.  Patient continues to mask when outside of the home except when walking in yard or at safe distances from others .  Patient denies any change in mood or development of unhealthy behaviors resuting from the pandemic's restriction of activities and socialization.    Back pain  Chronic. Aggravated by yard work, which she continues to do herself since she is a widow. Has not tried combining aleve and tylenol   New onset feet swelling /  Taking amlodipine and no longer taking losartan ("because I don't want to be on 2 medications")  Taking sennakot :  1-2 daily prn constipation.  Forces intake of water 32 ounces daily .     Outpatient Medications Prior to Visit  Medication Sig Dispense Refill  . Ascorbic Acid (VITAMIN C) 1000 MG tablet Take 1,000 mg by mouth daily.    . ASPIRIN 81 PO Take by mouth.    . calcium-vitamin D 250-100 MG-UNIT tablet Take 1 tablet by mouth 2 (two) times daily.    . diazepam (VALIUM) 5 MG tablet Take 1 tablet (5 mg total) by mouth daily as needed for anxiety or muscle spasms. 30 tablet 5  . FIBER PO Take by mouth.    . Garlic Oil 2585 MG CAPS Take 1 capsule by mouth daily.     [name] GINKGO BILOBA COMPLEX PO Take 1 capsule by mouth daily.    [name] loratadine (ALLERGY) 10 MG tablet Take 10 mg by mouth daily as needed for allergies.      . Lutein 20 MG CAPS Take 20 mg by mouth daily.      . Magnesium 400 MG CAPS Take 1 capsule by mouth daily.    . naproxen sodium (ALEVE) 220 MG tablet Take 220 mg by mouth daily as needed.    . NON FORMULARY Diet type: NAS    . propranolol (INDERAL) 60 MG tablet Take 60 mg by mouth every other day.    [name] Propylene Glycol (SYSTANE BALANCE OP) Apply to eye.    . sennosides-docusate sodium (SENOKOT-S) 8.6-50 MG tablet Take 2 tablets by mouth as needed.     . trimethoprim (TRIMPEX) 100 MG tablet Take 1 tablet (100 mg total) by mouth daily. 30 tablet 11  . VITAMIN E PO Take 400 Units by mouth daily.     [name] amLODipine (NORVASC) 10 MG tablet Take 1 tablet (10 mg total) by mouth daily. KEEP ON FILE FOR FUTURE REFILLS; note dose increase 90 tablet 1  . imiquimod (ALDARA) 5 % cream APPLY TO LEFT FOREHEAD DAILY MONDAY THRU FRIDAY FOR 12 WEEKS (Patient not taking: Reported on 07/28/2019)     No facility-administered medications prior to visit.    Review of Systems;  Patient denies headache, fevers, malaise, unintentional weight loss, skin rash, eye pain, sinus congestion and sinus pain, sore throat, dysphagia,  hemoptysis , cough, dyspnea, wheezing, chest  pain, palpitations, orthopnea, edema, abdominal pain, nausea, melena, diarrhea, constipation, flank pain, dysuria, hematuria, urinary  Frequency, nocturia, numbness, tingling, seizures,  Focal weakness, Loss of consciousness,  Tremor, insomnia, depression, anxiety, and suicidal ideation.      Objective:  BP (!) 152/90 (BP Location: Left Arm, Patient Position: Sitting, Cuff Size: Normal)   Pulse (!) 114   Temp (!) 97.5 F (36.4 C) (Oral)   Resp 16   Ht 5\' 6"  (1.676 m)   Wt 127 lb 12.8 oz (58 kg)   SpO2 99%   BMI 20.63 kg/m   BP Readings from Last 3 Encounters:  07/28/19 (!) 152/90  06/02/19 136/72  04/26/19 (!) 170/90    Wt Readings from Last 3 Encounters:  07/28/19 127 lb 12.8 oz (58 kg)  06/02/19 129 lb 3.2 oz (58.6 kg)  05/25/19 130 lb  (59 kg)    General appearance: alert, cooperative and appears stated age Ears: normal TM's and external ear canals both ears Throat: lips, mucosa, and tongue normal; teeth and gums normal Neck: no adenopathy, no carotid bruit, supple, symmetrical, trachea midline and thyroid not enlarged, symmetric, no tenderness/mass/nodules Back: symmetric, no curvature. ROM normal. No CVA tenderness. Lungs: clear to auscultation bilaterally Heart: regular rate and rhythm, S1, S2 normal, no murmur, click, rub or gallop Abdomen: soft, non-tender; bowel sounds normal; no masses,  no organomegaly Pulses: 2+ and symmetric Skin: Skin color, texture, turgor normal minimal nonpitting edema at ankles bilaterally . No rashes or lesions Lymph nodes: Cervical, supraclavicular, and axillary nodes normal.  No results found for: HGBA1C  Lab Results  Component Value Date   CREATININE 0.82 06/02/2019   CREATININE 0.88 04/26/2019   CREATININE 0.83 04/04/2019    Lab Results  Component Value Date   WBC 7.0 10/22/2017   HGB 10.9 (L) 10/22/2017   HCT 33.6 (L) 10/22/2017   PLT 468 (H) 10/22/2017   GLUCOSE 100 (H) 06/02/2019   CHOL 254 (H) 06/09/2016   TRIG 103.0 06/09/2016   HDL 60.00 06/09/2016   LDLDIRECT 145.6 12/21/2012   LDLCALC 174 (H) 06/09/2016   ALT 9 06/02/2019   AST 17 06/02/2019   NA 133 (L) 06/02/2019   K 4.1 06/02/2019   CL 97 06/02/2019   CREATININE 0.82 06/02/2019   BUN 13 06/02/2019   CO2 31 06/02/2019   TSH 2.09 03/06/2019   INR 0.99 11/30/2016    US Pelvic Complete With Transvaginal  Result Date: 06/13/2019 CLINICAL DATA:  Lower abdominal bloating and discomfort for months question ovarian mass, postmenopausal EXAM: TRANSABDOMINAL AND TRANSVAGINAL ULTRASOUND OF PELVIS TECHNIQUE: Both transabdominal and transvaginal ultrasound examinations of the pelvis were performed. Transabdominal technique was performed for global imaging of the pelvis including uterus, ovaries, adnexal regions, and  pelvic cul-de-sac. It was necessary to proceed with endovaginal exam following the transabdominal exam to visualize the endometrium and ovaries. COMPARISON:  None Correlation: CT abdomen pelvis 06/07/2018 FINDINGS: Uterus Measurements: 4.8 x 2.0 x 3.2 cm = volume: 16 mL. Atrophic. Scattered echogenic foci likely calcifications. No discrete uterine mass Endometrium Thickness: 2 mm.  Small amount of nonspecific endometrial fluid. Right ovary Not visualized, likely obscured by bowel Left ovary Not visualized, likely obscured by bowel Other findings Trace free pelvic fluid. No adnexal masses. Indentation of the urinary bladder by bowel and a fold. IMPRESSION: Atrophic uterus. Small amount of nonspecific endometrial fluid. Nonvisualization of ovaries. Electronically Signed   By: Lavonia Dana M.D.   On: 06/13/2019 16:35    Assessment & Plan:  Problem List Items Addressed This Visit      Unprioritized   Essential hypertension    She has decided to take 2 medications.  Will decrease  amlodipine to 5 mg and resume losartan 50 mg daily .  Patient understands goal is 130/80      Relevant Medications   losartan (COZAAR) 50 MG tablet   amLODipine (NORVASC) 5 MG tablet   Abdominal swelling, left lower quadrant    CT of abd,  transvaginal ultrasound, and general surgery consulted  failed to show any masses or hernias .         I have discontinued [name] "[name]"'s imiquimod. I have also changed her amLODipine. Additionally, I am having her start on losartan. Lastly, I am having her maintain her Lutein, VITAMIN E PO, Garlic Oil, NON FORMULARY, sennosides-docusate sodium, vitamin C, FIBER PO, ASPIRIN 81 PO, calcium-vitamin D, propranolol, trimethoprim, naproxen sodium, loratadine, Propylene Glycol (SYSTANE BALANCE OP), GINKGO BILOBA COMPLEX PO, Magnesium, and diazepam.  Meds ordered this encounter  Medications  . losartan (COZAAR) 50 MG tablet    Sig: Take 1 tablet (50 mg total) by mouth daily.     Dispense:  90 tablet    Refill:  3  . amLODipine (NORVASC) 5 MG tablet    Sig: Take 1 tablet (5 mg total) by mouth daily. KEEP ON FILE FOR FUTURE REFILLS; note dose reduction    Dispense:  90 tablet    Refill:  1    I provided  20 minutes of  face-to-face time during this encounter reviewing patient's current problems and past surgeries, labs and imaging studies, providing counseling on the above mentioned problems , and coordination  of care . Medications Discontinued During This Encounter  Medication Reason  . imiquimod (ALDARA) 5 % cream Completed Course  . amLODipine (NORVASC) 10 MG tablet     Follow-up: Return in about 3 months (around 10/28/2019).   Crecencio Mc, MD

## 2019-07-30 NOTE — Assessment & Plan Note (Addendum)
She has decided to take 2 medications.  Will decrease  amlodipine to 5 mg and resume losartan 50 mg daily .  Patient understands goal is 130/80

## 2019-07-30 NOTE — Assessment & Plan Note (Signed)
CT of abd,  transvaginal ultrasound, and general surgery consulted  failed to show any masses or hernias .

## 2019-08-01 DIAGNOSIS — H353221 Exudative age-related macular degeneration, left eye, with active choroidal neovascularization: Secondary | ICD-10-CM | POA: Diagnosis not present

## 2019-08-01 DIAGNOSIS — H3411 Central retinal artery occlusion, right eye: Secondary | ICD-10-CM | POA: Diagnosis not present

## 2019-08-08 DIAGNOSIS — S0502XA Injury of conjunctiva and corneal abrasion without foreign body, left eye, initial encounter: Secondary | ICD-10-CM | POA: Diagnosis not present

## 2019-08-08 DIAGNOSIS — H3411 Central retinal artery occlusion, right eye: Secondary | ICD-10-CM | POA: Diagnosis not present

## 2019-08-08 DIAGNOSIS — H353221 Exudative age-related macular degeneration, left eye, with active choroidal neovascularization: Secondary | ICD-10-CM | POA: Diagnosis not present

## 2019-09-05 ENCOUNTER — Other Ambulatory Visit: Payer: Self-pay | Admitting: Internal Medicine

## 2019-09-05 NOTE — Telephone Encounter (Signed)
Refill request for valium, last seen 07-28-19, last filled 04-04-19.  Please advise.

## 2019-09-14 ENCOUNTER — Encounter: Payer: Self-pay | Admitting: Internal Medicine

## 2019-09-14 ENCOUNTER — Ambulatory Visit (INDEPENDENT_AMBULATORY_CARE_PROVIDER_SITE_OTHER): Payer: Medicare Other | Admitting: Internal Medicine

## 2019-09-14 ENCOUNTER — Other Ambulatory Visit: Payer: Self-pay

## 2019-09-14 VITALS — BP 120/64 | HR 62 | Temp 97.8°F | Ht 66.0 in | Wt 127.0 lb

## 2019-09-14 DIAGNOSIS — K5900 Constipation, unspecified: Secondary | ICD-10-CM

## 2019-09-14 DIAGNOSIS — R200 Anesthesia of skin: Secondary | ICD-10-CM

## 2019-09-14 DIAGNOSIS — R202 Paresthesia of skin: Secondary | ICD-10-CM

## 2019-09-14 DIAGNOSIS — R600 Localized edema: Secondary | ICD-10-CM | POA: Diagnosis not present

## 2019-09-14 DIAGNOSIS — I10 Essential (primary) hypertension: Secondary | ICD-10-CM | POA: Diagnosis not present

## 2019-09-14 LAB — TSH: TSH: 1.38 u[IU]/mL (ref 0.35–4.50)

## 2019-09-14 LAB — B12 AND FOLATE PANEL
Folate: 14.1 ng/mL (ref >5.9)
Vitamin B-12: 677 pg/mL (ref 211–911)

## 2019-09-14 NOTE — Progress Notes (Signed)
Subjective:  Patient ID: [name], female    DOB: 1926-04-04  Age: 86 y.o. MRN: [id number]  CC: The primary encounter diagnosis was Numbness and tingling of both feet. Diagnoses of Edema of both lower extremities, Essential hypertension, and Constipation, unspecified constipation type were also pertinent to this visit.  HPI [name] presents for evaluation and treatment of bilateral foot swelling , very mild.   She has notice periodic swelling of feet without accompanying shortness of breath, dyspnea or orthopnea.  No weight gain. .    Numbness of toes  For the past several months  Denies pain.    b12 intolerant of supplements   Still bothered by LLQ pain  Worse with constipation. uing Sennakot daily ,  1 not enough,  2 too strong    Outpatient Medications Prior to Visit  Medication Sig Dispense Refill  . amLODipine (NORVASC) 5 MG tablet Take 1 tablet (5 mg total) by mouth daily. KEEP ON FILE FOR FUTURE REFILLS; note dose reduction 90 tablet 1  . Ascorbic Acid (VITAMIN C) 1000 MG tablet Take 1,000 mg by mouth daily.    . ASPIRIN 81 PO Take by mouth.    . calcium-vitamin D 250-100 MG-UNIT tablet Take 1 tablet by mouth 2 (two) times daily.    . diazepam (VALIUM) 5 MG tablet TAKE ONE TABLET BY MOUTH ONCE DAILY AS NEEDED FOR ANXIETY OR FOR MUSCLE SPASMS 30 tablet 5  . FIBER PO Take by mouth.    . Garlic Oil 7616 MG CAPS Take 1 capsule by mouth daily.     [name] GINKGO BILOBA COMPLEX PO Take 1 capsule by mouth daily.    [name] losartan (COZAAR) 50 MG tablet Take 1 tablet (50 mg total) by mouth daily. 90 tablet 3  . Lutein 20 MG CAPS Take 20 mg by mouth daily.      . naproxen sodium (ALEVE) 220 MG tablet Take 220 mg by mouth daily as needed.    . NON FORMULARY Diet type: NAS    . propranolol (INDERAL) 60 MG tablet Take 60 mg by mouth every other day.    [name] Propylene Glycol (SYSTANE BALANCE OP) Apply to eye.    . sennosides-docusate sodium (SENOKOT-S) 8.6-50 MG tablet Take 2 tablets by  mouth as needed.     . trimethoprim (TRIMPEX) 100 MG tablet Take 1 tablet (100 mg total) by mouth daily. 30 tablet 11  . VITAMIN E PO Take 400 Units by mouth daily.     [name] loratadine (ALLERGY) 10 MG tablet Take 10 mg by mouth daily as needed for allergies. (Patient not taking: Reported on 09/14/2019)    . Magnesium 400 MG CAPS Take 1 capsule by mouth daily. (Patient not taking: Reported on 09/14/2019)     No facility-administered medications prior to visit.    Review of Systems;  Patient denies headache, fevers, malaise, unintentional weight loss, skin rash, eye pain, sinus congestion and sinus pain, sore throat, dysphagia,  hemoptysis , cough, dyspnea, wheezing, chest pain, palpitations, orthopnea, edema, abdominal pain, nausea, melena, diarrhea, constipation, flank pain, dysuria, hematuria, urinary  Frequency, nocturia, numbness, tingling, seizures,  Focal weakness, Loss of consciousness,  Tremor, insomnia, depression, anxiety, and suicidal ideation.      Objective:  BP 120/64   Pulse 62   Temp 97.8 F (36.6 C) (Oral)   Ht 5\' 6"  (1.676 m)   Wt 127 lb (57.6 kg)   SpO2 96%   BMI 20.50 kg/m   BP Readings  from Last 3 Encounters:  09/14/19 120/64  07/28/19 (!) 152/90  06/02/19 136/72    Wt Readings from Last 3 Encounters:  09/14/19 127 lb (57.6 kg)  07/28/19 127 lb 12.8 oz (58 kg)  06/02/19 129 lb 3.2 oz (58.6 kg)    General appearance: alert, cooperative and appears stated age Ears: normal TM's and external ear canals both ears Throat: lips, mucosa, and tongue normal; teeth and gums normal Neck: no adenopathy, no carotid bruit, supple, symmetrical, trachea midline and thyroid not enlarged, symmetric, no tenderness/mass/nodules Back: symmetric, no curvature. ROM normal. No CVA tenderness. Lungs: clear to auscultation bilaterally Heart: regular rate and rhythm, S1, S2 normal, no murmur, click, rub or gallop Abdomen: soft, non-tender; bowel sounds normal; no masses,  no  organomegaly Pulses: 2+ and symmetric Skin: Skin color, texture, turgor normal. No rashes or lesions, NO LE EDEMA Lymph nodes: Cervical, supraclavicular, and axillary nodes normal.  No results found for: HGBA1C  Lab Results  Component Value Date   CREATININE 0.82 06/02/2019   CREATININE 0.88 04/26/2019   CREATININE 0.83 04/04/2019    Lab Results  Component Value Date   WBC 7.0 10/22/2017   HGB 10.9 (L) 10/22/2017   HCT 33.6 (L) 10/22/2017   PLT 468 (H) 10/22/2017   GLUCOSE 100 (H) 06/02/2019   CHOL 254 (H) 06/09/2016   TRIG 103.0 06/09/2016   HDL 60.00 06/09/2016   LDLDIRECT 145.6 12/21/2012   LDLCALC 174 (H) 06/09/2016   ALT 9 06/02/2019   AST 17 06/02/2019   NA 133 (L) 06/02/2019   K 4.1 06/02/2019   CL 97 06/02/2019   CREATININE 0.82 06/02/2019   BUN 13 06/02/2019   CO2 31 06/02/2019   TSH 1.38 09/14/2019   INR 0.99 11/30/2016    US Pelvic Complete With Transvaginal  Result Date: 06/13/2019 CLINICAL DATA:  Lower abdominal bloating and discomfort for months question ovarian mass, postmenopausal EXAM: TRANSABDOMINAL AND TRANSVAGINAL ULTRASOUND OF PELVIS TECHNIQUE: Both transabdominal and transvaginal ultrasound examinations of the pelvis were performed. Transabdominal technique was performed for global imaging of the pelvis including uterus, ovaries, adnexal regions, and pelvic cul-de-sac. It was necessary to proceed with endovaginal exam following the transabdominal exam to visualize the endometrium and ovaries. COMPARISON:  None Correlation: CT abdomen pelvis 06/07/2018 FINDINGS: Uterus Measurements: 4.8 x 2.0 x 3.2 cm = volume: 16 mL. Atrophic. Scattered echogenic foci likely calcifications. No discrete uterine mass Endometrium Thickness: 2 mm.  Small amount of nonspecific endometrial fluid. Right ovary Not visualized, likely obscured by bowel Left ovary Not visualized, likely obscured by bowel Other findings Trace free pelvic fluid. No adnexal masses. Indentation of the  urinary bladder by bowel and a fold. IMPRESSION: Atrophic uterus. Small amount of nonspecific endometrial fluid. Nonvisualization of ovaries. Electronically Signed   By: Lavonia Dana M.D.   On: 06/13/2019 16:35    Assessment & Plan:   Problem List Items Addressed This Visit      Unprioritized   Essential hypertension    She has decided to take 2 medications.  CONTINUE lodipine to 5 mg and  losartan 50 mg daily .  Patient understands goal is 130/80      Edema of both lower extremities    REPORTED BY PATIENT,  NONE ON EXAM   COMPRESSION KNEE HIGHS/SUPPORT HOSE RECOMMENDED.       Constipation    Advised to add a stool softener to her daily BFL.        Other Visit Diagnoses    Numbness  and tingling of both feet    -  Primary   Relevant Orders   B12 and Folate Panel (Completed)   TSH (Completed)      I have discontinued [place] [employer]ls "[name]"'s loratadine and Magnesium. I am also having her maintain her Lutein, VITAMIN E PO, Garlic Oil, NON FORMULARY, sennosides-docusate sodium, vitamin C, FIBER PO, ASPIRIN 81 PO, calcium-vitamin D, propranolol, trimethoprim, naproxen sodium, Propylene Glycol (SYSTANE BALANCE OP), GINKGO BILOBA COMPLEX PO, losartan, amLODipine, and diazepam.  No orders of the defined types were placed in this encounter.   Medications Discontinued During This Encounter  Medication Reason  . loratadine (ALLERGY) 10 MG tablet   . Magnesium 400 MG CAPS     Follow-up: No follow-ups on file.   Crecencio Mc, MD

## 2019-09-14 NOTE — Patient Instructions (Signed)
°  I recommend buying mild compression socks or support hose to manage the fluid retention    There are 4 categories of laxatives.  They can be combined,  But some should not be used daily .  Bulk  forming laxatives   Ok to use daily  Citrucel, benefiber, metamucil, Fibercon, miralax)  Stool softener (docusate ; there's only one) :  ok to use daily    Stimulant laxatives (Ex Lax,  Correctol,  Senna,  Dulcolax)  :  avoid on a daily basis, not more than 2/weel   Cathartic laxatives ( MOM,  Mag citrate, Lactulose ) : not more than 2 times /week

## 2019-09-16 DIAGNOSIS — R600 Localized edema: Secondary | ICD-10-CM | POA: Insufficient documentation

## 2019-09-16 NOTE — Assessment & Plan Note (Signed)
Advised to add a stool softener to her daily BFL.

## 2019-09-16 NOTE — Assessment & Plan Note (Addendum)
She has decided to take 2 medications.  CONTINUE lodipine to 5 mg and  losartan 50 mg daily .  Patient understands goal is 130/80

## 2019-09-16 NOTE — Assessment & Plan Note (Addendum)
REPORTED BY PATIENT,  NONE ON EXAM   COMPRESSION KNEE HIGHS/SUPPORT HOSE RECOMMENDED.

## 2019-10-05 IMAGING — CT CT ABDOMEN AND PELVIS WITH CONTRAST
2 of 5 series · 16 of 46 positions shown, 18 images · IV contrast (omnipaque)
Comparison: None.

CLINICAL DATA: Left lower quadrant abdominal wall palpable
abnormality. Suspected hernia.

EXAM:
CT ABDOMEN AND PELVIS WITH CONTRAST
TECHNIQUE: Multidetector CT imaging of the abdomen and pelvis was performed
using the standard protocol following bolus administration of
intravenous contrast.
CONTRAST:  75mL OMNIPAQUE IOHEXOL 300 MG/ML  SOLN

[Series 2: abd pelvis · axial · 0.73mm/px · z∈[-1709,-1339]mm · 13 of 84 slices shown, 15 images (1 of 2)]
[im 5/84  soft-tissue]
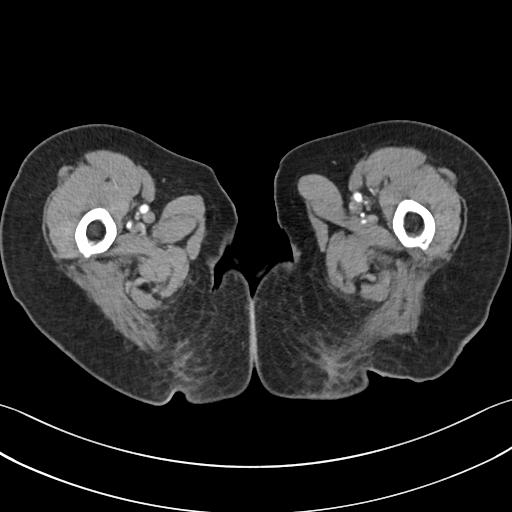
[im 5/84  bone]
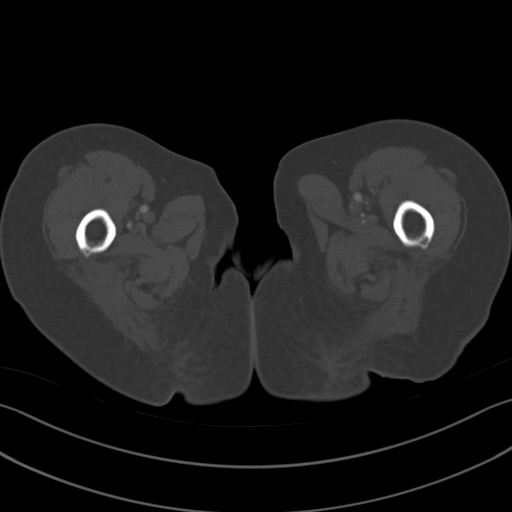
[im 14/84  soft-tissue]
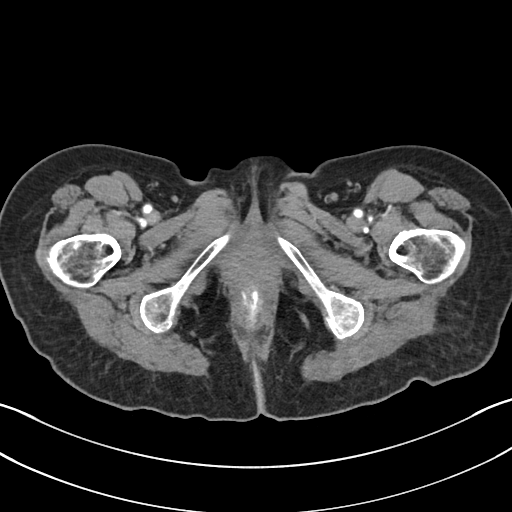
[im 18/84  soft-tissue]
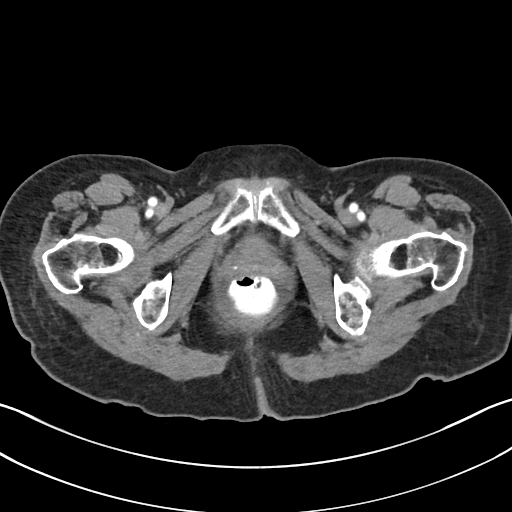
[im 22/84  soft-tissue]
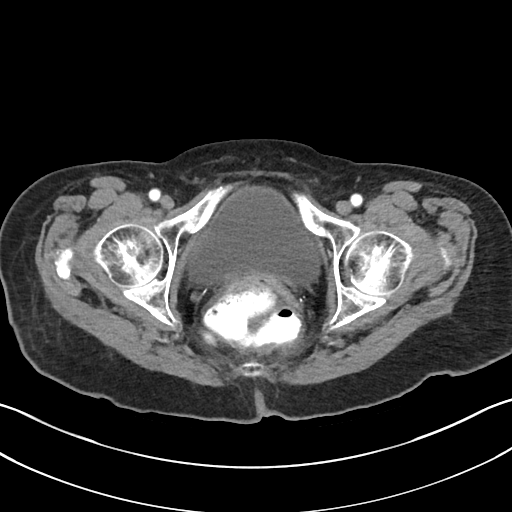
[im 31/84  soft-tissue]
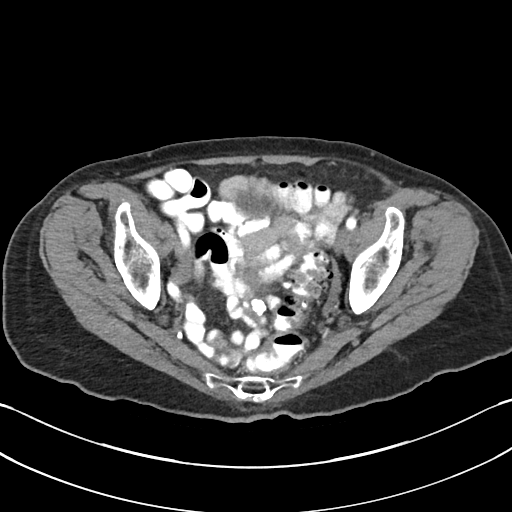
[im 35/84  soft-tissue]
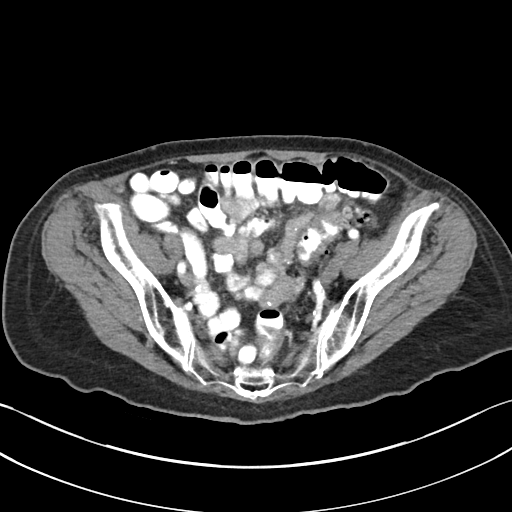
[im 44/84  soft-tissue]
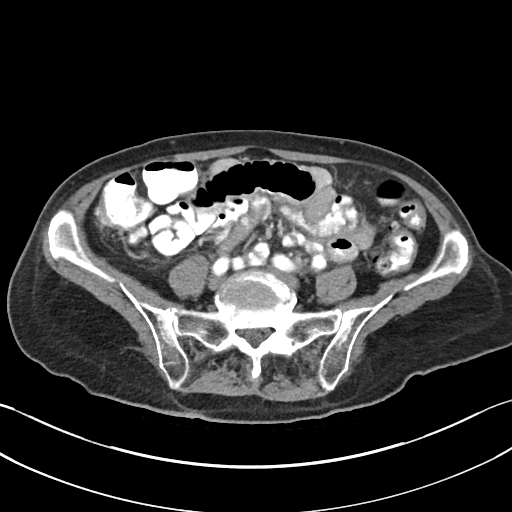
[im 49/84  soft-tissue]
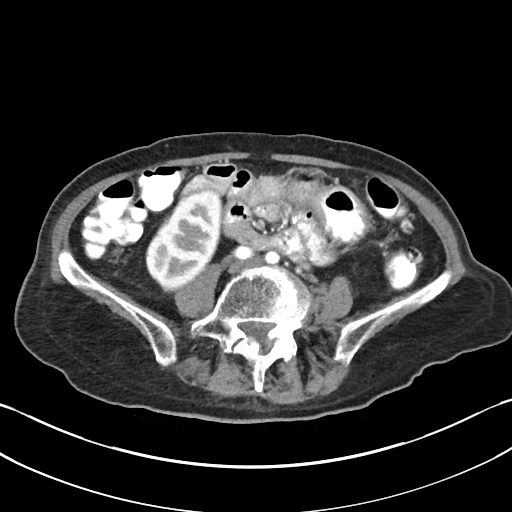
[im 53/84  soft-tissue]
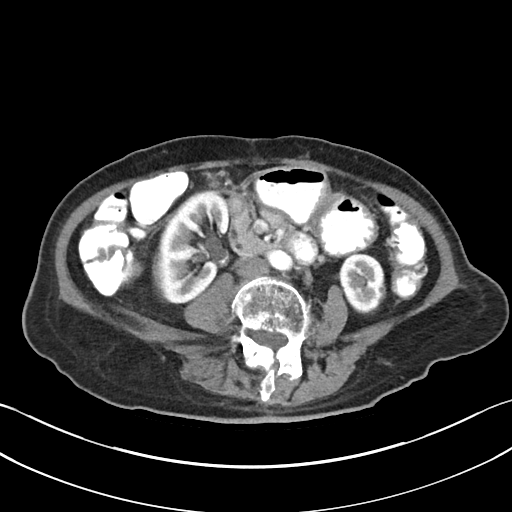
[im 53/84  bone]
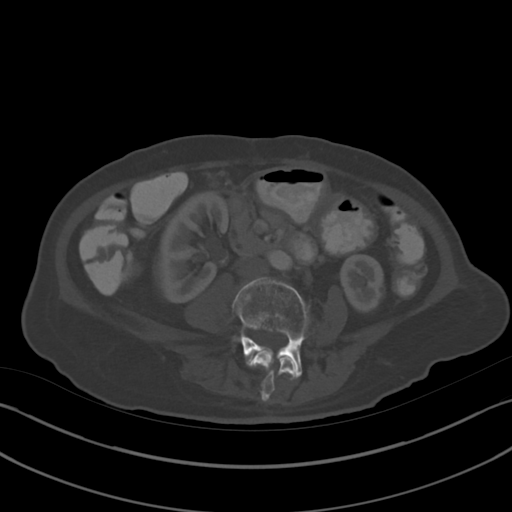
[im 62/84  soft-tissue]
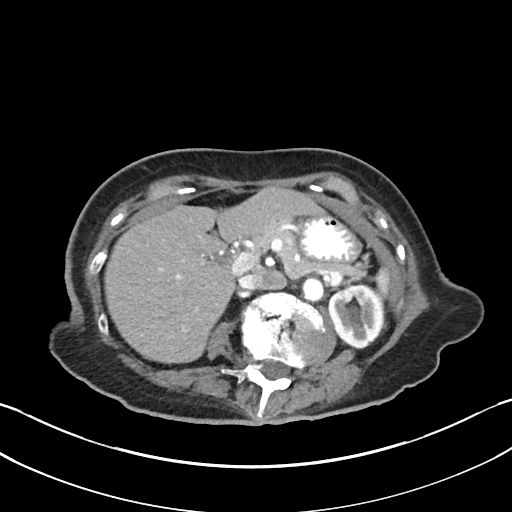
[im 66/84  soft-tissue]
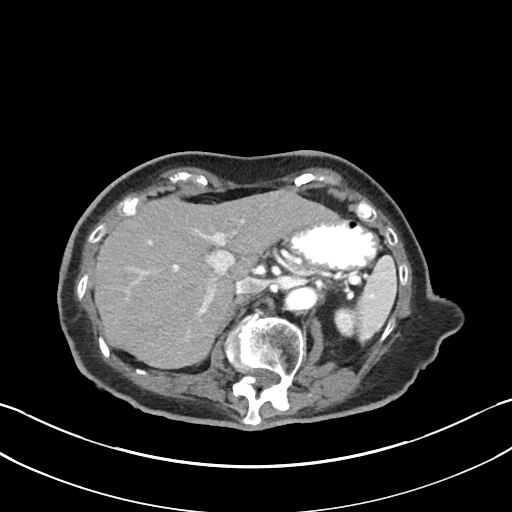
[im 70/84  soft-tissue]
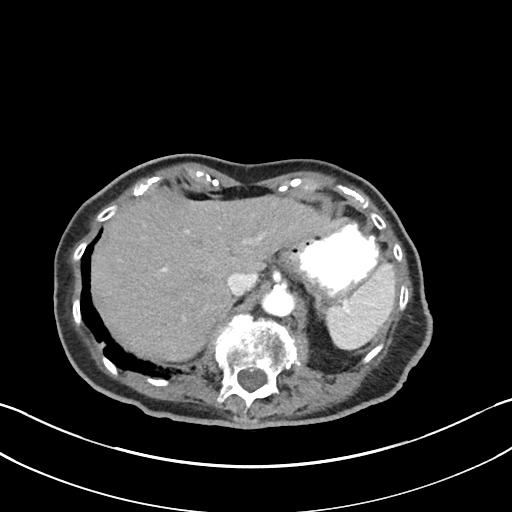
[im 79/84  soft-tissue]
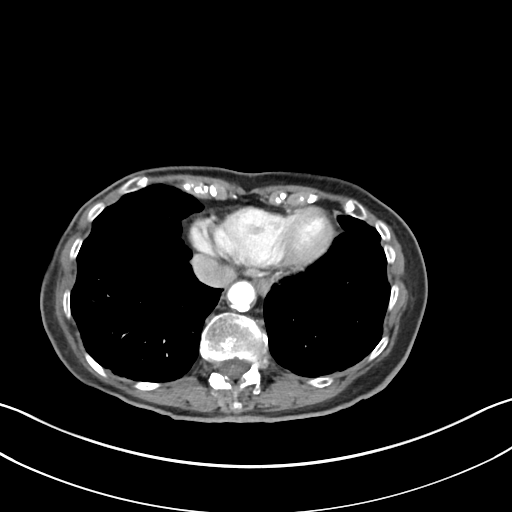

[Series 4: abd pelvis · coronal · 0.73mm/px · 3 of 118 slices shown (2 of 2)]
[im 40/118  soft-tissue]
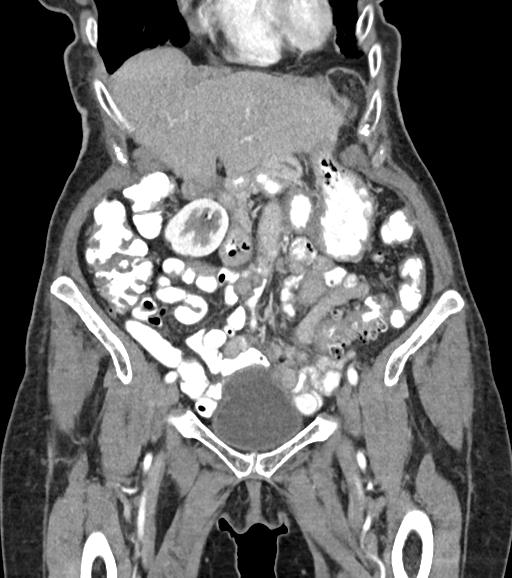
[im 53/118  soft-tissue]
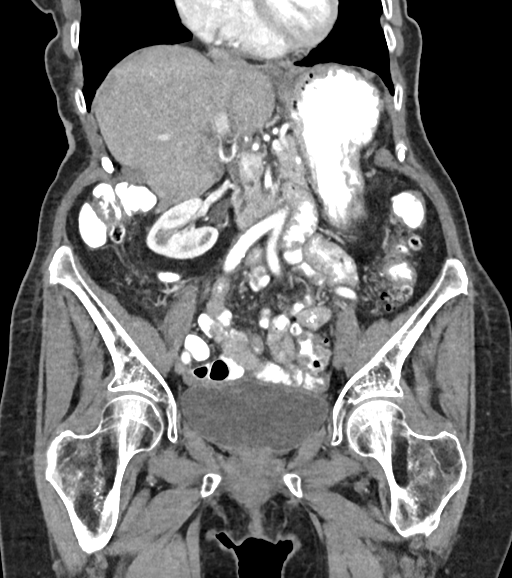
[im 66/118  soft-tissue]
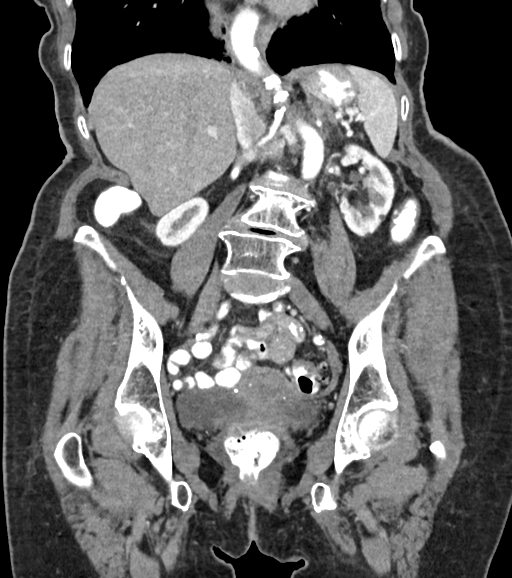

[16 of 46 positions shown; findings below may reference images not displayed]

FINDINGS: Lower Chest: No acute findings.

Hepatobiliary: No hepatic masses identified. Gallbladder is
unremarkable.

Pancreas:  No mass or inflammatory changes.

Spleen: Within normal limits in size and appearance.

Adrenals/Urinary Tract: No masses identified. No evidence of
hydronephrosis.

Stomach/Bowel: No evidence of obstruction, inflammatory process or
abnormal fluid collections. Normal appendix visualized.
Diverticulosis is seen mainly involving the descending and sigmoid
colon, however there is no evidence of diverticulitis.

Vascular/Lymphatic: No pathologically enlarged lymph nodes. No
abdominal aortic aneurysm. Aortic atherosclerosis.

Reproductive:  No mass or other significant abnormality.

Other:  No evidence of abdominal wall hernia or soft tissue mass.

Musculoskeletal: Moderate lumbar levoscoliosis noted. No suspicious
bone lesions identified. Old L1 vertebral body compression deformity
is seen. Moderate to severe multilevel lumbar degenerative disc
disease also seen.
IMPRESSION: 1. No evidence of abdominal wall hernia, mass, or other acute
findings.
2. Colonic diverticulosis. No radiographic evidence of
diverticulitis.
3. Lumbar levoscoliosis and degenerative spondylosis.

Aortic Atherosclerosis ([id number]).

## 2019-11-10 ENCOUNTER — Other Ambulatory Visit: Payer: Self-pay | Admitting: Internal Medicine

## 2019-11-21 DIAGNOSIS — H3411 Central retinal artery occlusion, right eye: Secondary | ICD-10-CM | POA: Diagnosis not present

## 2019-12-04 ENCOUNTER — Telehealth: Payer: Self-pay | Admitting: Internal Medicine

## 2019-12-04 NOTE — Telephone Encounter (Signed)
[facility] called to verify dosage of blood pressure medication at 5 mg not 10 mg

## 2019-12-04 NOTE — Telephone Encounter (Signed)
Spoke with [facility] to let them know that the amlodipine pt is taking is the 5 mg dose once daily.

## 2020-01-25 ENCOUNTER — Telehealth: Payer: Self-pay

## 2020-01-25 MED ORDER — PROPRANOLOL HCL 60 MG PO TABS: 60.0000 mg | ORAL_TABLET | ORAL | 1 refills | Status: DC

## 2020-01-25 NOTE — Telephone Encounter (Signed)
Medication has been refilled to [facility] as of today.

## 2020-01-25 NOTE — Telephone Encounter (Signed)
Pt said said that we will be receiving a request for next month's refill on propranolol to be sent to [facility] mail service. She said it is costing her $10 at the pharmacy and with [facility] she can get it for free.

## 2020-03-06 ENCOUNTER — Other Ambulatory Visit: Payer: Self-pay | Admitting: Internal Medicine

## 2020-03-06 ENCOUNTER — Telehealth: Payer: Self-pay | Admitting: Internal Medicine

## 2020-03-06 MED ORDER — PROPRANOLOL HCL 60 MG PO TABS: 60.0000 mg | ORAL_TABLET | ORAL | 1 refills | Status: DC

## 2020-03-06 NOTE — Telephone Encounter (Signed)
Patient called in for refill for propranolol (INDERAL) 60 MG tablet  And it needs to be called in to [facility] mail service they told the patient that did not have the prescription

## 2020-03-06 NOTE — Telephone Encounter (Signed)
Prescription clarification was faxed to [facility] yesterday.

## 2020-03-07 ENCOUNTER — Other Ambulatory Visit: Payer: Self-pay

## 2020-03-07 MED ORDER — PROPRANOLOL HCL 60 MG PO TABS: 60.0000 mg | ORAL_TABLET | ORAL | 1 refills | Status: DC

## 2020-03-11 NOTE — Telephone Encounter (Signed)
noted 

## 2020-03-11 NOTE — Telephone Encounter (Signed)
Patient called in about her prescription from [place] she stated that she would like a call back need to talk with Dr.Tullo about the amountpropranolol (INDERAL) 60 MG tablet it suppose to be 90 day supply

## 2020-03-11 NOTE — Telephone Encounter (Signed)
Pt called and said disregard  last message

## 2020-03-12 NOTE — Telephone Encounter (Signed)
Tried calling pt. No answer no voicemail.

## 2020-03-12 NOTE — Telephone Encounter (Signed)
Renee with [facility] called and states that the directions state one every other day and pt told her that she is taking it EVERY day. Please call back to clarify

## 2020-03-12 NOTE — Telephone Encounter (Signed)
Pt called about propranolol (INDERAL) 60 MG tablet but would not tell me what she needed. She just states that it is about that medication and that there is a lot going on. She only wants to speak with you.

## 2020-03-13 MED ORDER — PROPRANOLOL HCL ER 60 MG PO CP24: 60.0000 mg | ORAL_CAPSULE | Freq: Every day | ORAL | 1 refills | Status: DC

## 2020-03-13 NOTE — Telephone Encounter (Signed)
Renee with [facility] called back about propranolol . Please call back

## 2020-03-13 NOTE — Addendum Note (Signed)
Addended by: Crecencio Mc on: 03/13/2020 05:26 PM   Modules accepted: Orders

## 2020-03-13 NOTE — Telephone Encounter (Signed)
She should take it daily  And it should be the LA (long acting ) version so I have resent it to [facility].  ,  It is for her tremor.

## 2020-03-13 NOTE — Telephone Encounter (Signed)
Is pt supposed to be taking propranolol every other day or daily. Per pt she is taking it daily.

## 2020-03-14 NOTE — Telephone Encounter (Signed)
Spoke with [facility] to make sure they had filled the correct rx with the correct directions of use. Pharmacy stated that they filled the correct rx yesterday.

## 2020-03-15 ENCOUNTER — Other Ambulatory Visit: Payer: Self-pay

## 2020-03-15 NOTE — Telephone Encounter (Signed)
Pt needs refill on diazepam (VALIUM) 5 MG tablet TODAY. She states that she is out of it as of today and needs it to sleep. Please advise

## 2020-03-16 MED ORDER — DIAZEPAM 5 MG PO TABS: ORAL_TABLET | ORAL | 5 refills | Status: DC

## 2020-03-19 DIAGNOSIS — L570 Actinic keratosis: Secondary | ICD-10-CM | POA: Diagnosis not present

## 2020-03-19 DIAGNOSIS — Z85828 Personal history of other malignant neoplasm of skin: Secondary | ICD-10-CM | POA: Diagnosis not present

## 2020-03-19 DIAGNOSIS — L578 Other skin changes due to chronic exposure to nonionizing radiation: Secondary | ICD-10-CM | POA: Diagnosis not present

## 2020-03-19 DIAGNOSIS — C44622 Squamous cell carcinoma of skin of right upper limb, including shoulder: Secondary | ICD-10-CM | POA: Diagnosis not present

## 2020-03-19 DIAGNOSIS — D485 Neoplasm of uncertain behavior of skin: Secondary | ICD-10-CM | POA: Diagnosis not present

## 2020-03-19 DIAGNOSIS — L821 Other seborrheic keratosis: Secondary | ICD-10-CM | POA: Diagnosis not present

## 2020-04-04 DIAGNOSIS — C44622 Squamous cell carcinoma of skin of right upper limb, including shoulder: Secondary | ICD-10-CM | POA: Diagnosis not present

## 2020-04-17 ENCOUNTER — Other Ambulatory Visit: Payer: Self-pay

## 2020-04-17 ENCOUNTER — Encounter: Payer: Self-pay | Admitting: Internal Medicine

## 2020-04-17 ENCOUNTER — Ambulatory Visit (INDEPENDENT_AMBULATORY_CARE_PROVIDER_SITE_OTHER): Payer: Medicare Other | Admitting: Internal Medicine

## 2020-04-17 VITALS — BP 152/84 | HR 70 | Resp 15 | Ht 66.0 in | Wt 132.4 lb

## 2020-04-17 DIAGNOSIS — I10 Essential (primary) hypertension: Secondary | ICD-10-CM

## 2020-04-17 DIAGNOSIS — R1904 Left lower quadrant abdominal swelling, mass and lump: Secondary | ICD-10-CM | POA: Diagnosis not present

## 2020-04-17 DIAGNOSIS — Z0000 Encounter for general adult medical examination without abnormal findings: Secondary | ICD-10-CM

## 2020-04-17 DIAGNOSIS — G250 Essential tremor: Secondary | ICD-10-CM | POA: Diagnosis not present

## 2020-04-17 DIAGNOSIS — I7300 Raynaud's syndrome without gangrene: Secondary | ICD-10-CM

## 2020-04-17 DIAGNOSIS — F5104 Psychophysiologic insomnia: Secondary | ICD-10-CM

## 2020-04-17 NOTE — Progress Notes (Signed)
Patient ID: [name], female    DOB: 1926-02-24  Age: 87 y.o. MRN: [id number]  The patient is here for annual preventive examination and management of other chronic and acute problems.   The risk factors are reflected in the social history.  The roster of all physicians providing medical care to patient - is listed in the Snapshot section of the chart.  Activities of daily living:  The patient is 100% independent in all ADLs: dressing, toileting, feeding as well as independent mobility  Home safety : The patient has smoke detectors in the home. They wear seatbelts.  There are no firearms at home. There is no violence in the home.   There is no risks for hepatitis, STDs or HIV. There is no   history of blood transfusion. They have no travel history to infectious disease endemic areas of the world.  The patient has seen their dentist in the last six month. They have seen their eye doctor in the last year. They admit to slight hearing difficulty with regard to whispered voices and some television programs.  They have deferred audiologic testing in the last year.  They do not  have excessive sun exposure. Discussed the need for sun protection: hats, long sleeves and use of sunscreen if there is significant sun exposure.   Diet: the importance of a healthy diet is discussed. They do have a healthy diet.  The benefits of regular aerobic exercise were discussed. She walks 4 times per week ,  20 minutes.   Depression screen: there are no signs or vegative symptoms of depression- irritability, change in appetite, anhedonia, sadness/tearfullness.  Cognitive assessment: the patient manages all their financial and personal affairs and is actively engaged. They could relate day,date,year and events; recalled 2/3 objects at 3 minutes; performed clock-face test normally.  The following portions of the patient's history were reviewed and updated as appropriate: allergies, current medications, past  family history, past medical history,  past surgical history, past social history  and problem list.  Visual acuity was not assessed per patient preference since she has regular follow up with her ophthalmologist. Hearing and body mass index were assessed and reviewed.   During the course of the visit the patient was educated and counseled about appropriate screening and preventive services including : fall prevention , diabetes screening, nutrition counseling, colorectal cancer screening, and recommended immunizations.    CC: The primary encounter diagnosis was Benign essential tremor. Diagnoses of Essential hypertension, Abdominal swelling, left lower quadrant, Encounter for preventive health examination, Raynaud's phenomenon without gangrene, and Psychophysiological insomnia were also pertinent to this visit.  follow up on hypertension  This visit occurred during the SARS-CoV-2 public health emergency.  Safety protocols were in place, including screening questions prior to the visit, additional usage of staff PPE, and extensive cleaning of exam room while observing appropriate contact time as indicated for disinfecting solutions.   She has Chronic insomnia , with no improvement using over-the-counter first generation antihistamines. Reviewed principles of good sleep hygiene and prior medications . Sleeping fine . Using valium  5mg  daily   "stomach swelling":  Continues to report that her lower abdomen is enlarging and she cannot wear the same size pants as she used to.  No change in stools, no weight gain    Toes go numb in cold weather,  Hand as  Well.  Chronic   Hypertension: patient checks blood pressure twice weekly at home.  Readings have been for the most part >  140/80 at rest . Patient is following a reduce salt diet most days and is taking medications as prescribed  follow up on chronic issues including GAD and low back pain from DDD and spinal stenosis.  Back pain really bothering her  .  Received an epidural steroid injection In January which was ineffective, and ds not want to get another one,because she is wary of the risks of paralysis .  She is using diazepam  5 mg daily and notes that she is able to stand a little while  Longer on the days she takes it in the morning .  She would like to have a prescription for it.  She is accompanied by her niece , who has been staying with her during the day and has not noticed any sedation with use of the valium .  Patient continues to maintain her own house and yard,  No recent falls. Has gained back a few lbs since her last visit, at which time she wasl grieving the loss of her husband.    History Jumana has a past medical history of Anxiety, Arthritis, Back pain, Basal cell carcinoma, Chronic orthostatic hypotension (June 2012), Constipation (08/12/2013), Depression, Hyperlipidemia, Hypertension, Tibia/fibula fracture (10/09/2017), and Urinary frequency.   She has a past surgical history that includes IM nailing tibia (Left, 10/10/2017); Fracture surgery; Ankle fracture surgery (Left, 1998); Wrist fracture surgery (Right, 2005); Cataract extraction w/ intraocular lens  implant, bilateral (Bilateral); and Tibia IM nail insertion (Left, 10/10/2017).   Her family history includes Colon cancer in her mother; Heart disease in her father; Kidney disease in her father.She reports that she has never smoked. She has never used smokeless tobacco. She reports that she does not drink alcohol and does not use drugs.  Outpatient Medications Prior to Visit  Medication Sig Dispense Refill  . amLODipine (NORVASC) 5 MG tablet Take 1 tablet (5 mg total) by mouth daily. KEEP ON FILE FOR FUTURE REFILLS; note dose reduction 90 tablet 1  . Ascorbic Acid (VITAMIN C) 1000 MG tablet Take 1,000 mg by mouth daily.    . calcium-vitamin D 250-100 MG-UNIT tablet Take 1 tablet by mouth 2 (two) times daily.    . diazepam (VALIUM) 5 MG tablet TAKE ONE TABLET BY MOUTH ONCE  DAILY AS NEEDED FOR ANXIETY OR FOR MUSCLE SPASMS 30 tablet 5  . FIBER PO Take by mouth.    . Garlic Oil 6629 MG CAPS Take 1 capsule by mouth daily.     [name] GINKGO BILOBA COMPLEX PO Take 1 capsule by mouth daily.    . Lutein 20 MG CAPS Take 20 mg by mouth daily.      . propranolol ER (INDERAL LA) 60 MG 24 hr capsule Take 1 capsule (60 mg total) by mouth daily. 90 capsule 1  . Propylene Glycol (SYSTANE BALANCE OP) Apply to eye.    . sennosides-docusate sodium (SENOKOT-S) 8.6-50 MG tablet Take 2 tablets by mouth as needed.     [name] VITAMIN E PO Take 400 Units by mouth daily.     . ASPIRIN 81 PO Take by mouth. (Patient not taking: Reported on 04/17/2020)    . losartan (COZAAR) 50 MG tablet Take 1 tablet (50 mg total) by mouth daily. (Patient not taking: Reported on 04/17/2020) 90 tablet 3  . naproxen sodium (ALEVE) 220 MG tablet Take 220 mg by mouth daily as needed. (Patient not taking: Reported on 04/17/2020)    . NON FORMULARY Diet type: NAS (Patient not taking: Reported on  04/17/2020)    . trimethoprim (TRIMPEX) 100 MG tablet Take 1 tablet (100 mg total) by mouth daily. (Patient not taking: Reported on 04/17/2020) 30 tablet 11   No facility-administered medications prior to visit.    Review of Systems   Patient denies headache, fevers, malaise, unintentional weight loss, skin rash, eye pain, sinus congestion and sinus pain, sore throat, dysphagia,  hemoptysis , cough, dyspnea, wheezing, chest pain, palpitations, orthopnea, edema, abdominal pain, nausea, melena, diarrhea, constipation, flank pain, dysuria, hematuria, urinary  Frequency, nocturia, numbness, tingling, seizures,  Focal weakness, Loss of consciousness,  Tremor, insomnia, depression, anxiety, and suicidal ideation.      Objective:  BP (!) 152/84 (BP Location: Left Arm, Patient Position: Sitting, Cuff Size: Normal)   Pulse 70   Resp 15   Ht 5\' 6"  (1.676 m)   Wt 132 lb 6.4 oz (60.1 kg)   SpO2 97%   BMI 21.37 kg/m   Physical  Exam   General appearance: alert, cooperative and appears stated age Ears: normal TM's and external ear canals both ears Throat: lips, mucosa, and tongue normal; teeth and gums normal Neck: no adenopathy, no carotid bruit, supple, symmetrical, trachea midline and thyroid not enlarged, symmetric, no tenderness/mass/nodules Back: symmetric, no curvature. ROM normal. No CVA tenderness. Lungs: clear to auscultation bilaterally Heart: regular rate and rhythm, S1, S2 normal, no murmur, click, rub or gallop Abdomen: soft, non-tender; bowel sounds normal; no masses,  no organomegaly Pulses: 2+ and symmetric Skin: Skin color, texture, turgor normal. No rashes or lesions Lymph nodes: Cervical, supraclavicular, and axillary nodes normal. Assessment & Plan:   Problem List Items Addressed This Visit      Unprioritized   Abdominal swelling, left lower quadrant    She continues to be bothered by her abdominal protuberance which has been evaluated both with imaging and Surgical consult. CT of abd,  transvaginal ultrasound, and general surgery consulted  failed to show any masses or hernias .  Reviewed workup again with patient       Benign essential tremor - Primary    Managed with propranolol.  No changes today      Encounter for preventive health examination    age appropriate education and counseling updated, referrals for preventative services and immunizations addressed, dietary and smoking counseling addressed, most recent labs reviewed.  I have personally reviewed and have noted:  1) the patient's medical and social history 2) The pt's use of alcohol, tobacco, and illicit drugs 3) The patient's current medications and supplements 4) Functional ability including ADL's, fall risk, home safety risk, hearing and visual impairment 5) Diet and physical activities 6) Evidence for depression or mood disorder 7) The patient's height, weight, and BMI have been recorded in the chart  I have made  referrals, and provided counseling and education based on review of the above      Essential hypertension    Well controlled on current regimen for age.  Continue losartan 50 mg and inderal LA 60 mg daily . Renal function stable, no changes today.  Lab Results  Component Value Date   CREATININE 0.82 04/17/2020   Lab Results  Component Value Date   NA 135 04/17/2020   K 3.8 04/17/2020   CL 99 04/17/2020   CO2 30 04/17/2020         Relevant Orders   Basic metabolic panel (Completed)   Insomnia    She is managing her insomnia with low dose  Valium. The risks and benefits of benzodiazepine  use were discussed with patient today including excessive sedation leading to respiratory depression,  impaired thinking/driving, and addiction.  Patient was advised to avoid concurrent use with alcohol, to use medication only as needed and not to share with others  .       Raynaud phenomenon    advised to use hand warmers in cold weather          I am having [employer]" maintain her Lutein, VITAMIN E PO, Garlic Oil, NON FORMULARY, sennosides-docusate sodium, vitamin C, FIBER PO, ASPIRIN 81 PO, calcium-vitamin D, trimethoprim, naproxen sodium, Propylene Glycol (SYSTANE BALANCE OP), GINKGO BILOBA COMPLEX PO, losartan, amLODipine, propranolol ER, and diazepam.  No orders of the defined types were placed in this encounter.   There are no discontinued medications.  Follow-up: Return in about 6 months (around 10/17/2020).   Crecencio Mc, MD

## 2020-04-17 NOTE — Patient Instructions (Signed)
  Your blood pressure readings at home are mostly fine  You have "white coat hypertension" which means the office makes your blood pressure goes up

## 2020-04-18 LAB — BASIC METABOLIC PANEL
BUN: 12 mg/dL (ref 6–23)
CO2: 30 mEq/L (ref 19–32)
Calcium: 9.3 mg/dL (ref 8.4–10.5)
Chloride: 99 mEq/L (ref 96–112)
Creatinine, Ser: 0.82 mg/dL (ref 0.40–1.20)
GFR: 62.56 mL/min (ref >60.00)
Glucose, Bld: 88 mg/dL (ref 70–99)
Potassium: 3.8 mEq/L (ref 3.5–5.1)
Sodium: 135 mEq/L (ref 135–145)

## 2020-04-19 ENCOUNTER — Encounter: Payer: Self-pay | Admitting: Internal Medicine

## 2020-04-19 NOTE — Assessment & Plan Note (Signed)

## 2020-04-19 NOTE — Assessment & Plan Note (Signed)
advised to use hand warmers in cold weather

## 2020-04-19 NOTE — Assessment & Plan Note (Signed)
She is managing her insomnia with low dose  Valium. The risks and benefits of benzodiazepine use were discussed with patient today including excessive sedation leading to respiratory depression,  impaired thinking/driving, and addiction.  Patient was advised to avoid concurrent use with alcohol, to use medication only as needed and not to share with others  .

## 2020-04-19 NOTE — Assessment & Plan Note (Signed)
Well controlled on current regimen for age.  Continue losartan 50 mg and inderal LA 60 mg daily . Renal function stable, no changes today.  Lab Results  Component Value Date   CREATININE 0.82 04/17/2020   Lab Results  Component Value Date   NA 135 04/17/2020   K 3.8 04/17/2020   CL 99 04/17/2020   CO2 30 04/17/2020

## 2020-04-19 NOTE — Assessment & Plan Note (Signed)
Managed with propranolol.  No changes today

## 2020-04-19 NOTE — Assessment & Plan Note (Signed)
She continues to be bothered by her abdominal protuberance which has been evaluated both with imaging and Surgical consult. CT of abd,  transvaginal ultrasound, and general surgery consulted  failed to show any masses or hernias .  Reviewed workup again with patient

## 2020-04-25 DIAGNOSIS — H353222 Exudative age-related macular degeneration, left eye, with inactive choroidal neovascularization: Secondary | ICD-10-CM | POA: Diagnosis not present

## 2020-04-25 DIAGNOSIS — H3411 Central retinal artery occlusion, right eye: Secondary | ICD-10-CM | POA: Diagnosis not present

## 2020-05-27 ENCOUNTER — Ambulatory Visit (INDEPENDENT_AMBULATORY_CARE_PROVIDER_SITE_OTHER): Payer: Medicare Other

## 2020-05-27 VITALS — Ht 66.0 in | Wt 132.0 lb

## 2020-05-27 DIAGNOSIS — Z0000 Encounter for general adult medical examination without abnormal findings: Secondary | ICD-10-CM

## 2020-05-27 NOTE — Progress Notes (Addendum)
Subjective:   [name] is a 87 y.o. female who presents for Medicare Annual (Subsequent) preventive examination.  Review of Systems    No ROS.  Medicare Wellness Virtual Visit.  Visual/audio telehealth visit, UTA vital signs.   See social history for additional risk factors.   Cardiac Risk Factors include: advanced age (>7men, >104 women)     Objective:    Today's Vitals   05/27/20 1536  Weight: 132 lb (59.9 kg)  Height: 5\' 6"  (1.676 m)   Body mass index is 21.31 kg/m.  Advanced Directives 05/27/2020 05/25/2019 12/04/2017 11/08/2017 10/20/2017 10/14/2017 10/11/2017  Does Patient Have a Medical Advance Directive? Yes Yes Yes Yes Yes Yes No  Type of Paramedic of [place];Living will [place];Living will [place];Living will Living will Living will [place] -  Does patient want to make changes to medical advance directive? No - Patient declined No - Patient declined - No - Patient declined No - Patient declined No - Patient declined No - Patient declined  Copy of [place] in Chart? Yes - validated most recent copy scanned in chart (See row information) Yes - validated most recent copy scanned in chart (See row information) - - - Yes -  Would patient like information on creating a medical advance directive? - - - No - Patient declined - No - Patient declined No - Patient declined    Current Medications (verified) Outpatient Encounter Medications as of 05/27/2020  Medication Sig  . amLODipine (NORVASC) 5 MG tablet Take 1 tablet (5 mg total) by mouth daily. KEEP ON FILE FOR FUTURE REFILLS; note dose reduction  . Ascorbic Acid (VITAMIN C) 1000 MG tablet Take 1,000 mg by mouth daily.  . ASPIRIN 81 PO Take by mouth. (Patient not taking: Reported on 04/17/2020)  . calcium-vitamin D 250-100 MG-UNIT tablet Take 1 tablet by mouth 2 (two) times daily.  . diazepam (VALIUM) 5 MG tablet  TAKE ONE TABLET BY MOUTH ONCE DAILY AS NEEDED FOR ANXIETY OR FOR MUSCLE SPASMS  . FIBER PO Take by mouth.  . Garlic Oil 3235 MG CAPS Take 1 capsule by mouth daily.   [name] GINKGO BILOBA COMPLEX PO Take 1 capsule by mouth daily.  [name] losartan (COZAAR) 50 MG tablet Take 1 tablet (50 mg total) by mouth daily. (Patient not taking: Reported on 04/17/2020)  . Lutein 20 MG CAPS Take 20 mg by mouth daily.    . naproxen sodium (ALEVE) 220 MG tablet Take 220 mg by mouth daily as needed. (Patient not taking: Reported on 04/17/2020)  . NON FORMULARY Diet type: NAS (Patient not taking: Reported on 04/17/2020)  . propranolol ER (INDERAL LA) 60 MG 24 hr capsule Take 1 capsule (60 mg total) by mouth daily.  [name] Propylene Glycol (SYSTANE BALANCE OP) Apply to eye.  . sennosides-docusate sodium (SENOKOT-S) 8.6-50 MG tablet Take 2 tablets by mouth as needed.   . trimethoprim (TRIMPEX) 100 MG tablet Take 1 tablet (100 mg total) by mouth daily. (Patient not taking: Reported on 04/17/2020)  . VITAMIN E PO Take 400 Units by mouth daily.    No facility-administered encounter medications on file as of 05/27/2020.    Allergies (verified) Patient has no known allergies.   History: Past Medical History:  Diagnosis Date  . Anxiety   . Arthritis    "may have a touch; all over I guess" (10/11/2017)  . Back pain    "all my  back" (10/11/2017)  . Basal cell carcinoma    "have had many taken off; burned" (10/11/2017)  . Chronic orthostatic hypotension June 2012   with pprior syncopal event,  did not tolelrate florinef trial  . Constipation 08/12/2013  . Depression   . Hyperlipidemia   . Hypertension   . Tibia/fibula fracture 10/09/2017  . Urinary frequency    Past Surgical History:  Procedure Laterality Date  . ANKLE FRACTURE SURGERY Left 1998  . CATARACT EXTRACTION W/ INTRAOCULAR LENS  IMPLANT, BILATERAL Bilateral   . FRACTURE SURGERY    . IM NAILING TIBIA Left 10/10/2017  . TIBIA IM NAIL INSERTION Left 10/10/2017    Procedure: INTRAMEDULLARY (IM) NAIL TIBIAL;  Surgeon: Leandrew Koyanagi, MD;  Location: [facility];  Service: Orthopedics;  Laterality: Left;  . WRIST FRACTURE SURGERY Right 2005   Family History  Problem Relation Age of Onset  . Colon cancer Mother   . Heart disease Father   . Kidney disease Father   . Cancer Neg Hx   . Diabetes Neg Hx    Social History   Socioeconomic History  . Marital status: Widowed    Spouse name: Not on file  . Number of children: Not on file  . Years of education: Not on file  . Highest education level: Not on file  Occupational History  . 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 Smoking status: Never Smoker  . Smokeless tobacco: Never Used  Vaping Use  . Vaping Use: Never used  Substance and Sexual Activity  . Alcohol use: Never  . Drug use: Never  . Sexual activity: Not Currently  Other Topics Concern  . Not on file  Social History Narrative  . Not on file   Social Determinants of Health   Financial Resource Strain: Low Risk   . Difficulty of Paying Living Expenses: Not hard at all  Food Insecurity: No Food Insecurity  . Worried About Charity fundraiser in the Last Year: Never true  . Ran Out of Food in the Last Year: Never true  Transportation Needs: No Transportation Needs  . Lack of Transportation (Medical): No  . Lack of Transportation (Non-Medical): No  Physical Activity: Insufficiently Active  . Days of Exercise per Week: 4 days  . Minutes of Exercise per Session: 20 min  Stress: No Stress Concern Present  . Feeling of Stress : Not at all  Social Connections: Unknown  . Frequency of Communication with Friends and Family: More than three times a week  . Frequency of Social Gatherings with Friends and Family: More than three times a week  . Attends Religious Services: More than 4 times per year  . Active Member of Clubs or Organizations: Not on file  . Attends Archivist  Meetings: Not on file  . Marital Status: Widowed    Tobacco Counseling Counseling given: Not Answered   Clinical Intake:  Pre-visit preparation completed: Yes        Diabetes: No  How often do you need to have someone help you when you read instructions, pamphlets, or other written materials from your doctor or pharmacy?: 1 - Never   Interpreter Needed?: No      Activities of Daily Living In your present state of health, do you have any difficulty performing the following activities: 05/27/2020  Hearing? N  Vision? N  Difficulty concentrating or making decisions? N  Walking or climbing stairs?  N  Dressing or bathing? N  Doing errands, shopping? N  Preparing Food and eating ? N  Using the Toilet? N  In the past six months, have you accidently leaked urine? N  Do you have problems with loss of bowel control? N  Managing your Medications? N  Managing your Finances? N  Housekeeping or managing your Housekeeping? N  Some recent data might be hidden    Patient Care Team: Crecencio Mc, MD as PCP - General (Internal Medicine)  Indicate any recent Medical Services you may have received from other than [facility] providers in the past year (date may be approximate).     Assessment:   This is a routine wellness examination for [name].  I connected with Lameshia today by telephone and verified that I am speaking with the correct person using two identifiers. Location patient: home Location provider: work Persons participating in the virtual visit: patient, Marine scientist.    I discussed the limitations, risks, security and privacy concerns of performing an evaluation and management service by telephone and the availability of in person appointments. The patient expressed understanding and verbally consented to this telephonic visit.    Interactive audio and video telecommunications were attempted between this provider and patient, however failed, due to patient having technical  difficulties OR patient did not have access to video capability.  We continued and completed visit with audio only.  Some vital signs may be absent or patient reported.   Hearing/Vision screen  Hearing Screening   125Hz  250Hz  500Hz  1000Hz  2000Hz  3000Hz  4000Hz  6000Hz  8000Hz   Right ear:           Left ear:           Comments: Patient is able to hear conversational tones without difficulty.  No issues reported.  Vision Screening Comments: Wears corrective lenses  Cataract extraction, bilateral  Visual acuity not assessed, virtual visit. They have seen their ophthalmologist in the last 12 months.   Dietary issues and exercise activities discussed: Current Exercise Habits: Home exercise routine, Intensity: Mild  Healthy diet Water intake 32 ounces daily  Goals Addressed              This Visit's Progress     Patient Stated   .  I try to drink 32 ounces of water daily (pt-stated)   On track     Stay hydrated      Depression Screen PHQ 2/9 Scores 05/27/2020 09/14/2019 05/25/2019 11/14/2018 06/16/2016 01/04/2015 06/13/2014  PHQ - 2 Score 0 0 0 0 0 0 0  PHQ- 9 Score - - - - 7 - -    Fall Risk Fall Risk  05/27/2020 04/17/2020 09/14/2019 07/28/2019 06/02/2019  Falls in the past year? 0 0 0 0 0  Number falls in past yr: 0 - - - -  Injury with Fall? 0 - - - -  Follow up Falls evaluation completed Falls evaluation completed Falls evaluation completed Falls evaluation completed Falls evaluation completed    [employer]: Handrail in use when climbing stairs? Yes Home free of loose throw rugs in walkways, pet beds, electrical cords, etc? Yes  Adequate lighting in your home to reduce risk of falls? Yes   ASSISTIVE DEVICES UTILIZED TO PREVENT FALLS: Life alert? Yes  Use of a cane, walker or w/c? No , as needed Grab bars in the bathroom? Yes  Shower chair or bench in shower? No  Elevated toilet seat or a handicapped toilet? Yes  TIMED UP AND GO: Was the  test performed? No . Virtual visit.   Cognitive Function:  Patient is alert and oriented x3.   6CIT Screen 05/27/2020 05/25/2019  What Year? 0 points 0 points  What month? 0 points 0 points  What time? 0 points 0 points  Count back from 20 0 points 0 points  Months in reverse 0 points 0 points   Immunizations Immunization History  Administered Date(s) Administered  . Janssen (J&J) SARS-COV-2 Vaccination 07/01/2019    TDAP status: Due, Education has been provided regarding the importance of this vaccine. Advised may receive this vaccine at local pharmacy or Health Dept. Aware to provide a copy of the vaccination record if obtained from local pharmacy or Health Dept. Verbalized acceptance and understanding. Deferred.   Health Maintenance Health Maintenance  Topic Date Due  . COVID-19 Vaccine (2 - Janssen risk 3-dose series) 01/04/2021 (Originally 07/29/2019)  . TETANUS/TDAP  05/27/2021 (Originally 12/11/1945)  . DEXA SCAN  Completed  . HPV VACCINES  Aged Out  . INFLUENZA VACCINE  Discontinued  . PNA vac Low Risk Adult  Discontinued   Colorectal cancer screening: No longer required.   Mammogram status: No longer required due to patient preference.  Lung Cancer Screening: (Low Dose CT Chest recommended if Age 10-80 years, 30 pack-year currently smoking OR have quit w/in 15years.) does not qualify.   Hepatitis C Screening: does not qualify.  Vision Screening: Recommended annual ophthalmology exams for early detection of glaucoma and other disorders of the eye. Is the patient up to date with their annual eye exam?  Yes  Who is the provider or what is the name of the office in which the patient attends annual eye exams? [facility].  Dental Screening: Recommended annual dental exams for proper oral hygiene.   Community Resource Referral / Chronic Care Management: CRR required this visit?  No   CCM required this visit?  No      Plan:   Keep all routine maintenance  appointments.   I have personally reviewed and noted the following in the patient's chart:   . Medical and social history . Use of alcohol, tobacco or illicit drugs  . Current medications and supplements including opioid prescriptions. Patient is not currently taking opioids.  . Functional ability and status . Nutritional status . Physical activity . Advanced directives . List of other physicians . Hospitalizations, surgeries, and ER visits in previous 12 months . Vitals . Screenings to include cognitive, depression, and falls . Referrals and appointments  In addition, I have reviewed and discussed with patient certain preventive protocols, quality metrics, and best practice recommendations. A written personalized care plan for preventive services as well as general preventive health recommendations were provided to patient.     OBrien-Blaney, Cathe Bilger L, LPN   [phone]     I have reviewed the above information and agree with above.   Deborra Medina, MD

## 2020-05-27 NOTE — Patient Instructions (Addendum)
[name] , Thank you for taking time to come for your Medicare Wellness Visit. I appreciate your ongoing commitment to your health goals. Please review the following plan we discussed and let me know if I can assist you in the future.   These are the goals we discussed: Goals      Patient Stated   .  I try to drink 32 ounces of water daily (pt-stated)      Stay hydrated       This is a list of the screening recommended for you and due dates:  Health Maintenance  Topic Date Due  . COVID-19 Vaccine (2 - Janssen risk 3-dose series) 01/04/2021*  . Tetanus Vaccine  05/27/2021*  . DEXA scan (bone density measurement)  Completed  . HPV Vaccine  Aged Out  . Flu Shot  Discontinued  . Pneumonia vaccines  Discontinued  *Topic was postponed. The date shown is not the original due date.   Immunizations Immunization History  Administered Date(s) Administered  . Janssen (J&J) SARS-COV-2 Vaccination 07/01/2019   Advanced directives: on file  Conditions/risks identified: none new  Follow up in one year for your annual wellness visit    Preventive Care 65 Years and Older, Female Preventive care refers to lifestyle choices and visits with your health care provider that can promote health and wellness. What does preventive care include?  A yearly physical exam. This is also called an annual well check.  Dental exams once or twice a year.  Routine eye exams. Ask your health care provider how often you should have your eyes checked.  Personal lifestyle choices, including:  Daily care of your teeth and gums.  Regular physical activity.  Eating a healthy diet.  Avoiding tobacco and drug use.  Limiting alcohol use.  Practicing safe sex.  Taking low-dose aspirin every day.  Taking vitamin and mineral supplements as recommended by your health care provider. What happens during an annual well check? The services and screenings done by your health care provider during your  annual well check will depend on your age, overall health, lifestyle risk factors, and family history of disease. Counseling  Your health care provider may ask you questions about your:  Alcohol use.  Tobacco use.  Drug use.  Emotional well-being.  Home and relationship well-being.  Sexual activity.  Eating habits.  History of falls.  Memory and ability to understand (cognition).  Work and work Statistician.  Reproductive health. Screening  You may have the following tests or measurements:  Height, weight, and BMI.  Blood pressure.  Lipid and cholesterol levels. These may be checked every 5 years, or more frequently if you are over 5 years old.  Skin check.  Lung cancer screening. You may have this screening every year starting at age 37 if you have a 30-pack-year history of smoking and currently smoke or have quit within the past 15 years.  Fecal occult blood test (FOBT) of the stool. You may have this test every year starting at age 73.  Flexible sigmoidoscopy or colonoscopy. You may have a sigmoidoscopy every 5 years or a colonoscopy every 10 years starting at age 26.  Hepatitis C blood test.  Hepatitis B blood test.  Sexually transmitted disease (STD) testing.  Diabetes screening. This is done by checking your blood sugar (glucose) after you have not eaten for a while (fasting). You may have this done every 1-3 years.  Bone density scan. This is done to screen for osteoporosis. You may have  this done starting at age 58.  Mammogram. This may be done every 1-2 years. Talk to your health care provider about how often you should have regular mammograms. Talk with your health care provider about your test results, treatment options, and if necessary, the need for more tests. Vaccines  Your health care provider may recommend certain vaccines, such as:  Influenza vaccine. This is recommended every year.  Tetanus, diphtheria, and acellular pertussis (Tdap, Td)  vaccine. You may need a Td booster every 10 years.  Zoster vaccine. You may need this after age 22.  Pneumococcal 13-valent conjugate (PCV13) vaccine. One dose is recommended after age 85.  Pneumococcal polysaccharide (PPSV23) vaccine. One dose is recommended after age 41. Talk to your health care provider about which screenings and vaccines you need and how often you need them. This information is not intended to replace advice given to you by your health care provider. Make sure you discuss any questions you have with your health care provider. Document Released: 01/18/2015 Document Revised: 09/11/2015 Document Reviewed: 10/23/2014 Elsevier Interactive Patient Education  2017 [place] Prevention in the Home Falls can cause injuries. They can happen to people of all ages. There are many things you can do to make your home safe and to help prevent falls. What can I do on the outside of my home?  Regularly fix the edges of walkways and driveways and fix any cracks.  Remove anything that might make you trip as you walk through a door, such as a raised step or threshold.  Trim any bushes or trees on the path to your home.  Use bright outdoor lighting.  Clear any walking paths of anything that might make someone trip, such as rocks or tools.  Regularly check to see if handrails are loose or broken. Make sure that both sides of any steps have handrails.  Any raised decks and porches should have guardrails on the edges.  Have any leaves, snow, or ice cleared regularly.  Use sand or salt on walking paths during winter.  Clean up any spills in your garage right away. This includes oil or grease spills. What can I do in the bathroom?  Use night lights.  Install grab bars by the toilet and in the tub and shower. Do not use towel bars as grab bars.  Use non-skid mats or decals in the tub or shower.  If you need to sit down in the shower, use a plastic, non-slip  stool.  Keep the floor dry. Clean up any water that spills on the floor as soon as it happens.  Remove soap buildup in the tub or shower regularly.  Attach bath mats securely with double-sided non-slip rug tape.  Do not have throw rugs and other things on the floor that can make you trip. What can I do in the bedroom?  Use night lights.  Make sure that you have a light by your bed that is easy to reach.  Do not use any sheets or blankets that are too big for your bed. They should not hang down onto the floor.  Have a firm chair that has side arms. You can use this for support while you get dressed.  Do not have throw rugs and other things on the floor that can make you trip. What can I do in the kitchen?  Clean up any spills right away.  Avoid walking on wet floors.  Keep items that you use a lot in easy-to-reach  places.  If you need to reach something above you, use a strong step stool that has a grab bar.  Keep electrical cords out of the way.  Do not use floor polish or wax that makes floors slippery. If you must use wax, use non-skid floor wax.  Do not have throw rugs and other things on the floor that can make you trip. What can I do with my stairs?  Do not leave any items on the stairs.  Make sure that there are handrails on both sides of the stairs and use them. Fix handrails that are broken or loose. Make sure that handrails are as long as the stairways.  Check any carpeting to make sure that it is firmly attached to the stairs. Fix any carpet that is loose or worn.  Avoid having throw rugs at the top or bottom of the stairs. If you do have throw rugs, attach them to the floor with carpet tape.  Make sure that you have a light switch at the top of the stairs and the bottom of the stairs. If you do not have them, ask someone to add them for you. What else can I do to help prevent falls?  Wear shoes that:  Do not have high heels.  Have rubber bottoms.  Are  comfortable and fit you well.  Are closed at the toe. Do not wear sandals.  If you use a stepladder:  Make sure that it is fully opened. Do not climb a closed stepladder.  Make sure that both sides of the stepladder are locked into place.  Ask someone to hold it for you, if possible.  Clearly mark and make sure that you can see:  Any grab bars or handrails.  First and last steps.  Where the edge of each step is.  Use tools that help you move around (mobility aids) if they are needed. These include:  Canes.  Walkers.  Scooters.  Crutches.  Turn on the lights when you go into a dark area. Replace any light bulbs as soon as they burn out.  Set up your furniture so you have a clear path. Avoid moving your furniture around.  If any of your floors are uneven, fix them.  If there are any pets around you, be aware of where they are.  Review your medicines with your doctor. Some medicines can make you feel dizzy. This can increase your chance of falling. Ask your doctor what other things that you can do to help prevent falls. This information is not intended to replace advice given to you by your health care provider. Make sure you discuss any questions you have with your health care provider. Document Released: 10/18/2008 Document Revised: 05/30/2015 Document Reviewed: 01/26/2014 Elsevier Interactive Patient Education  2017 [employer].

## 2020-06-06 ENCOUNTER — Telehealth: Payer: Self-pay | Admitting: Internal Medicine

## 2020-06-06 ENCOUNTER — Telehealth: Payer: Self-pay

## 2020-06-06 NOTE — Telephone Encounter (Signed)
PT called in wanting to speak to whoever she spoke to on May 23 in regarding to BP. Advise her would send this to [place] and they would get back in touch with her.

## 2020-06-06 NOTE — Telephone Encounter (Signed)
Called pt and she stated that this morning she woke up feeling really dizzy and "just not like myself". She stated that she took a 5 mg Amlodipine and laid back down in the bed. She got up still not feeling good so she checked her bp and it was 180/146. Around lunch time her bp was still elevated so she took another 5 mg Amlodipine. By 1:50pm her bp was back down to 111/62 and pt was starting to feel better. Pt stated that she did not take her bp medication yesterday because she just didn't feel like taking her medications. Pt is wanting to know if she can go back to taking the 10 mg amlodipine because her bp is ranging in the 130s and 140s with occasional 120s.

## 2020-06-06 NOTE — Telephone Encounter (Signed)
She needs to  not bother EMS for blood pressure checks and start being  consistent with her medications  EVERY DAY .  Do NOT take more than 5 mg daily of amlodipine

## 2020-06-07 NOTE — Telephone Encounter (Signed)
Spoke with pt and informed her of the message below. Pt gave a verbal understanding and stated that she would call back when she was ready to schedule a follow up appt.

## 2020-06-07 NOTE — Telephone Encounter (Signed)
error 

## 2020-06-08 ENCOUNTER — Other Ambulatory Visit: Payer: Self-pay | Admitting: Internal Medicine

## 2020-06-11 DIAGNOSIS — R1900 Intra-abdominal and pelvic swelling, mass and lump, unspecified site: Secondary | ICD-10-CM | POA: Diagnosis not present

## 2020-06-19 ENCOUNTER — Telehealth: Payer: Self-pay | Admitting: Internal Medicine

## 2020-06-19 DIAGNOSIS — R1900 Intra-abdominal and pelvic swelling, mass and lump, unspecified site: Secondary | ICD-10-CM | POA: Diagnosis not present

## 2020-06-19 NOTE — Telephone Encounter (Signed)
Patient went to OBGYN, while  she was there someone told  her maybe some of her female organs are swelling,and that is why her stomach is large. She wanted to speak to Dr Derrel Nip about this. No appointments available soon. It sounded like this was really bothering her, she ask for 10 minutes of Dr Lupita Dawn time.

## 2020-06-20 NOTE — Telephone Encounter (Signed)
Attempted to call pt- No answer/No voicemail.  

## 2020-07-05 DIAGNOSIS — R1909 Other intra-abdominal and pelvic swelling, mass and lump: Secondary | ICD-10-CM | POA: Diagnosis not present

## 2020-07-09 ENCOUNTER — Encounter: Payer: Self-pay | Admitting: Obstetrics and Gynecology

## 2020-07-10 ENCOUNTER — Telehealth: Payer: Self-pay | Admitting: Internal Medicine

## 2020-07-10 NOTE — Telephone Encounter (Signed)
Angie from [facility] clinic obgyn called to request a pre op clearance apt for the PT. Once they have this done she will be setup for surgery apt. Got the PT schedule for July 27 at 4pm.

## 2020-07-10 NOTE — Telephone Encounter (Signed)
Pt called and would like a call back to discuss getting an earlier appt with Dr. Derrel Nip

## 2020-07-10 NOTE — Telephone Encounter (Signed)
Spoke with pt and moved her appt up to 07/26/2020 at 11:30. Pt gave a verbal understanding.

## 2020-07-26 ENCOUNTER — Other Ambulatory Visit: Payer: Self-pay

## 2020-07-26 ENCOUNTER — Encounter: Payer: Self-pay | Admitting: Internal Medicine

## 2020-07-26 ENCOUNTER — Ambulatory Visit (INDEPENDENT_AMBULATORY_CARE_PROVIDER_SITE_OTHER): Payer: Medicare Other | Admitting: Internal Medicine

## 2020-07-26 VITALS — BP 158/80 | HR 63 | Temp 96.8°F | Ht 65.98 in | Wt 129.2 lb

## 2020-07-26 DIAGNOSIS — R1904 Left lower quadrant abdominal swelling, mass and lump: Secondary | ICD-10-CM | POA: Diagnosis not present

## 2020-07-26 DIAGNOSIS — Z01818 Encounter for other preprocedural examination: Secondary | ICD-10-CM | POA: Diagnosis not present

## 2020-07-26 LAB — CBC WITH DIFFERENTIAL/PLATELET
Basophils Absolute: 0.1 10*3/uL (ref 0.0–0.1)
Basophils Relative: 0.9 % (ref 0.0–3.0)
Eosinophils Absolute: 0.2 10*3/uL (ref 0.0–0.7)
Eosinophils Relative: 2.3 % (ref 0.0–5.0)
HCT: 38.7 % (ref 36.0–46.0)
Hemoglobin: 12.9 g/dL (ref 12.0–15.0)
Lymphocytes Relative: 24.6 % (ref 12.0–46.0)
Lymphs Abs: 1.6 10*3/uL (ref 0.7–4.0)
MCHC: 33.5 g/dL (ref 30.0–36.0)
MCV: 98.5 fl (ref 78.0–100.0)
Monocytes Absolute: 0.5 10*3/uL (ref 0.1–1.0)
Monocytes Relative: 8.1 % (ref 3.0–12.0)
Neutro Abs: 4.3 10*3/uL (ref 1.4–7.7)
Neutrophils Relative %: 64.1 % (ref 43.0–77.0)
Platelets: 306 10*3/uL (ref 150.0–400.0)
RBC: 3.93 Mil/uL (ref 3.87–5.11)
RDW: 13.2 % (ref 11.5–15.5)
WBC: 6.7 10*3/uL (ref 4.0–10.5)

## 2020-07-26 LAB — BASIC METABOLIC PANEL
BUN: 13 mg/dL (ref 6–23)
CO2: 28 mEq/L (ref 19–32)
Calcium: 9.8 mg/dL (ref 8.4–10.5)
Chloride: 98 mEq/L (ref 96–112)
Creatinine, Ser: 0.83 mg/dL (ref 0.40–1.20)
GFR: 61.54 mL/min (ref >60.00)
Glucose, Bld: 87 mg/dL (ref 70–99)
Potassium: 4.2 mEq/L (ref 3.5–5.1)
Sodium: 134 mEq/L — ABNORMAL LOW (ref 135–145)

## 2020-07-26 NOTE — Assessment & Plan Note (Addendum)
She is scheduled to undergo an elective lap BSO to remove a complex cyst that was noted on recent transvaginal ultrasound .   I have ordered and reviewed a 12 lead EKG and find that there are no acute changes compared to previous EKG from 2019   and patient is in sinus rhythm.  Other than her extreme age,  I see no contraindications to proceeding with surgery

## 2020-07-26 NOTE — Assessment & Plan Note (Addendum)
Caused by a complex  Cyst that  is measuring  13 x 7 x 11 cm by current U/s and was not reported by radiology on June 2021 ultrasound  for evaluatio of painless abdominal distension . Etiology is unclear.  She remains undecided about undergoing a laparoscopic procedure.

## 2020-07-26 NOTE — Progress Notes (Signed)
kg

## 2020-07-26 NOTE — Patient Instructions (Signed)
I will talk to Dr Ouida Sills about what happens if we DON'T go forward with the surgery at this point.

## 2020-07-26 NOTE — Progress Notes (Signed)
Subjective:  Patient ID: [name], female    DOB: 02/12/1926  Age: 87 y.o. MRN: [id number]  CC: The primary encounter diagnosis was Pre-op examination. Diagnoses of Preoperative evaluation to rule out surgical contraindication and Abdominal swelling, left lower quadrant were also pertinent to this visit.  HPI White Gwendolyn presents for   preoperative evaluation.   Patient has been scheduled for a laparoscopic BSO for removal of a complex cyst of uncertain etiology found on repeat  transvaginal ultrasound that was done 2 weeks ago by Dr.  Ouida Sills to evaluate her progressive lower abdominal distension.  She denies pain , weight loss and urinary obstructive symptoms and is ambivalent about undergoing surgery .  She is aware that the cyst may be CA , and would not want treatment for CA other than the aforementioned surgery. She is also aware that the cyst,  if left intact, may continue to enlarge and potentially obstruct her bladder or bowels , which may occur at a time in the future when she is not well enough to survive a surgery .  She is in excellent health and is limited only by low back pain which has restricted her outdoor activities somewhat ( she was advised last year to refrain from chopping wood).  She has lived alone for the last 3-4 years after her husband [name] passed. She is accompanied by her niece today.  She has no known history of CAD.  She denies chest pain, shortness of breath, and has not history of diabetes or CKD    U/s results:  Midline complex cyst with septation(posterior to bladder & anterior to uterus)=13.1 x 7.56 x 11.3cm; septation=0.36cm    Outpatient Medications Prior to Visit  Medication Sig Dispense Refill   amLODipine (NORVASC) 5 MG tablet Take 1 tablet (5 mg total) by mouth daily. KEEP ON FILE FOR FUTURE REFILLS; note dose reduction 90 tablet 1   Ascorbic Acid (VITAMIN C) 1000 MG tablet Take 1,000 mg by mouth daily.     ASPIRIN 81 PO Take by  mouth.     calcium-vitamin D 250-100 MG-UNIT tablet Take 1 tablet by mouth 2 (two) times daily.     diazepam (VALIUM) 5 MG tablet TAKE ONE TABLET BY MOUTH ONCE DAILY AS NEEDED FOR ANXIETY OR FOR MUSCLE SPASMS 30 tablet 5   FIBER PO Take by mouth.     Garlic Oil 123XX123 MG CAPS Take 1 capsule by mouth daily.      GINKGO BILOBA COMPLEX PO Take 1 capsule by mouth daily.     Lutein 20 MG CAPS Take 20 mg by mouth daily.       NON FORMULARY Diet type: NAS     propranolol ER (INDERAL LA) 60 MG 24 hr capsule TAKE ONE CAPSULE BY MOUTH ONCE DAILY 90 capsule 1   Propylene Glycol (SYSTANE BALANCE OP) Apply to eye.     sennosides-docusate sodium (SENOKOT-S) 8.6-50 MG tablet Take 2 tablets by mouth as needed.      VITAMIN E PO Take 400 Units by mouth daily.      losartan (COZAAR) 50 MG tablet Take 1 tablet (50 mg total) by mouth daily. (Patient not taking: No sig reported) 90 tablet 3   naproxen sodium (ALEVE) 220 MG tablet Take 220 mg by mouth daily as needed. (Patient not taking: No sig reported)     trimethoprim (TRIMPEX) 100 MG tablet Take 1 tablet (100 mg total) by mouth daily. (Patient not taking: No sig reported) 30  tablet 11   No facility-administered medications prior to visit.    Review of Systems;  Patient denies headache, fevers, malaise, unintentional weight loss, skin rash, eye pain, sinus congestion and sinus pain, sore throat, dysphagia,  hemoptysis , cough, dyspnea, wheezing, chest pain, palpitations, orthopnea, edema, abdominal pain, nausea, melena, diarrhea, constipation, flank pain, dysuria, hematuria, urinary  Frequency, nocturia, numbness, tingling, seizures,  Focal weakness, Loss of consciousness,  Tremor, insomnia, depression, anxiety, and suicidal ideation.      Objective:  BP (!) 158/80 (BP Location: Left Arm, Patient Position: Sitting)   Pulse 63   Temp (!) 96.8 F (36 C)   Ht 5' 5.98" (1.676 m)   Wt 129 lb 3.2 oz (58.6 kg)   SpO2 99%   BMI 20.86 kg/m   BP Readings from  Last 3 Encounters:  07/26/20 (!) 158/80  04/17/20 (!) 152/84  09/14/19 120/64    Wt Readings from Last 3 Encounters:  07/26/20 129 lb 3.2 oz (58.6 kg)  05/27/20 132 lb (59.9 kg)  04/17/20 132 lb 6.4 oz (60.1 kg)    General appearance: alert, cooperative and appears stated age Ears: normal TM's and external ear canals both ears Throat: lips, mucosa, and tongue normal; teeth and gums normal Neck: no adenopathy, no carotid bruit, supple, symmetrical, trachea midline and thyroid not enlarged, symmetric, no tenderness/mass/nodules Back: symmetric, no curvature. ROM normal. No CVA tenderness. Lungs: clear to auscultation bilaterally Heart: regular rate and rhythm, S1, S2 normal, no murmur, click, rub or gallop Abdomen: soft, non-tender; bowel sounds normal; no masses,  no organomegaly Pulses: 2+ and symmetric Skin: Skin color, texture, turgor normal. No rashes or lesions Lymph nodes: Cervical, supraclavicular, and axillary nodes normal.  No results found for: HGBA1C  Lab Results  Component Value Date   CREATININE 0.82 04/17/2020   CREATININE 0.82 06/02/2019   CREATININE 0.88 04/26/2019    Lab Results  Component Value Date   WBC 7.0 10/22/2017   HGB 10.9 (L) 10/22/2017   HCT 33.6 (L) 10/22/2017   PLT 468 (H) 10/22/2017   GLUCOSE 88 04/17/2020   CHOL 254 (H) 06/09/2016   TRIG 103.0 06/09/2016   HDL 60.00 06/09/2016   LDLDIRECT 145.6 12/21/2012   LDLCALC 174 (H) 06/09/2016   ALT 9 06/02/2019   AST 17 06/02/2019   NA 135 04/17/2020   K 3.8 04/17/2020   CL 99 04/17/2020   CREATININE 0.82 04/17/2020   BUN 12 04/17/2020   CO2 30 04/17/2020   TSH 1.38 09/14/2019   INR 0.99 11/30/2016    US Pelvic Complete With Transvaginal  Result Date: 06/13/2019 CLINICAL DATA:  Lower abdominal bloating and discomfort for months question ovarian mass, postmenopausal EXAM: TRANSABDOMINAL AND TRANSVAGINAL ULTRASOUND OF PELVIS TECHNIQUE: Both transabdominal and transvaginal ultrasound  examinations of the pelvis were performed. Transabdominal technique was performed for global imaging of the pelvis including uterus, ovaries, adnexal regions, and pelvic cul-de-sac. It was necessary to proceed with endovaginal exam following the transabdominal exam to visualize the endometrium and ovaries. COMPARISON:  None Correlation: CT abdomen pelvis 06/07/2018 FINDINGS: Uterus Measurements: 4.8 x 2.0 x 3.2 cm = volume: 16 mL. Atrophic. Scattered echogenic foci likely calcifications. No discrete uterine mass Endometrium Thickness: 2 mm.  Small amount of nonspecific endometrial fluid. Right ovary Not visualized, likely obscured by bowel Left ovary Not visualized, likely obscured by bowel Other findings Trace free pelvic fluid. No adnexal masses. Indentation of the urinary bladder by bowel and a fold. IMPRESSION: Atrophic uterus. Small amount of nonspecific  endometrial fluid. Nonvisualization of ovaries. Electronically Signed   By: Lavonia Dana M.D.   On: 06/13/2019 16:35    Assessment & Plan:   Problem List Items Addressed This Visit       Unprioritized   Abdominal swelling, left lower quadrant     Caused by a complex  Cyst that  is measuring  13 x 7 x 11 cm by current U/s and was not reported by radiology on June 2021 ultrasound  for evaluatio of painless abdominal distension . Etiology is unclear.  She remains undecided about undergoing a laparoscopic procedure.        Preoperative evaluation to rule out surgical contraindication    She is scheduled to undergo an elective lap BSO to remove a complex cyst that was noted on recent transvaginal ultrasound .   I have ordered and reviewed a 12 lead EKG and find that there are no acute changes compared to previous EKG from 2019   and patient is in sinus rhythm.  Other than her extreme age,  I see no contraindications to proceeding with surgery        Relevant Orders   Basic metabolic panel   CBC with Differential/Platelet   Other Visit Diagnoses      Pre-op examination    -  Primary   Relevant Orders   EKG 12-Lead (Completed)     A total of 40 minutes was spent with patient more than half of which was spent in counseling patient on the risks of proceeding with and not proceeding with  surgery  , reviewing and explaining recent labs and imaging studies done, and coordination of care.   I am having [employer]" maintain her Lutein, VITAMIN E PO, Garlic Oil, NON FORMULARY, sennosides-docusate sodium, vitamin C, FIBER PO, ASPIRIN 81 PO, calcium-vitamin D, trimethoprim, naproxen sodium, Propylene Glycol (SYSTANE BALANCE OP), GINKGO BILOBA COMPLEX PO, losartan, amLODipine, diazepam, and propranolol ER.  No orders of the defined types were placed in this encounter.   There are no discontinued medications.  Follow-up: No follow-ups on file.   Crecencio Mc, MD

## 2020-07-29 ENCOUNTER — Telehealth: Payer: Self-pay | Admitting: Internal Medicine

## 2020-07-29 NOTE — Telephone Encounter (Signed)
Angie from [facility] called to let Dr. Derrel Nip know that they are changing her  laparoscopic BSO to exploratory laparotomy BSO. They need to know if Dr. Derrel Nip is ok with her having this done

## 2020-07-29 NOTE — Telephone Encounter (Signed)
Tried to call [facility] for clarification. They are closed for lunch

## 2020-07-30 NOTE — Telephone Encounter (Signed)
[name] is aware that Dr. Derrel Nip is okay with the surgery being changed to exploratory.

## 2020-07-31 ENCOUNTER — Ambulatory Visit: Payer: Medicare Other | Admitting: Internal Medicine

## 2020-08-07 ENCOUNTER — Other Ambulatory Visit: Payer: Self-pay | Admitting: Internal Medicine

## 2020-08-07 NOTE — Telephone Encounter (Signed)
Okay to refill Valium? Last fill 03/15/20 last visit 7/22

## 2020-08-08 DIAGNOSIS — H3411 Central retinal artery occlusion, right eye: Secondary | ICD-10-CM | POA: Diagnosis not present

## 2020-08-08 DIAGNOSIS — H353222 Exudative age-related macular degeneration, left eye, with inactive choroidal neovascularization: Secondary | ICD-10-CM | POA: Diagnosis not present

## 2020-08-10 NOTE — H&P (Signed)
[name] is a 87 y.o. female here fL/s BSO , possible ex lap bso for symptomatic adnexal mass .  abdominal swelling seen by me Pt with c/o of lower abdominal swelling for the last 6 yrs . [name] Thought it may have started after her husband passed and she was extremely constipated . She heard a "pop ' with straining   CTscan in 2020 failed to show anything .( record review - outside)   BM normal now G0, no pelvic prolapse . No vaginal bleeding . She doesn't think she ever had a colonoscopy     06/11/20. U/s done on 06/19/20:   Uterus anteverted   Fibroids seen:1)fundal=1.7cm     2)Rt fundal=1.5cm   Fluid filled endometrium=7.99m; walls: 0.20 + 0.21cm=0.41cm (4.184m   Midline complex cyst with septation(posterior to bladder & anterior to uterus)=13.1 x 7.56 x 11.3cm; septation=0.36cm   B/L ovaries not seen   No free fluid seen   CA-125 nl and HE-4 119 mildly elevated Pt has elected not to get GYN/ ONC consultation for possible surgery that may involve cancer( despite my recommendation )   Past Medical History:  has a past medical history of Hypertension, Osteoarthritis, and Osteoporosis.  Past Surgical History:  has a past surgical history that includes Cataract extraction (Bilateral); Left Ankle Fx Repair (1998); and Right Wrist Fx Repair (2002). Family History: family history includes Cancer in her mother; Kidney failure in her father. Social History:  reports that she has never smoked. She has never used smokeless tobacco. OB/GYN History:                 OB History     Gravida  0   Para  0   Term  0   Preterm  0   AB  0   Living  0      SAB  0   IAB  0   Ectopic  0   Molar  0   Multiple  0   Live Births  0             Allergies: has No Known Allergies. Medications:   Current Outpatient Medications:   aspirin 81 MG EC tablet, Take 81 mg by mouth once daily., Disp: , Rfl:   cyanocobalamin (VITAMIN B12) 1000 MCG tablet, Take 1,000 mcg by mouth once  daily., Disp: , Rfl:   diazePAM (VALIUM) 5 MG tablet, Take 1 tablet (5 mg total) by mouth 2 (two) times daily, Disp: 60 tablet, Rfl: 3   docusate (COLACE) 100 MG capsule, Take 100 mg by mouth 2 (two) times daily as needed., Disp: , Rfl:   Lactobac no.41/Bifidobact no.7 (PROBIOTIC-10 ORAL), Take by mouth, Disp: , Rfl:   propranoloL (INDERAL LA) 60 MG LA capsule, TAKE ONE CAPSULE BY MOUTH AT BEDTIME, Disp: 30 capsule, Rfl: 2   vitamin E 400 UNIT capsule, Take 400 Units by mouth once daily., Disp: , Rfl:   traMADol (ULTRAM) 50 mg tablet, Take 50 mg by mouth as needed (Patient not taking: No sig reported), Disp: , Rfl:   Review of Systems: General:                      No fatigue or weight loss Eyes:                           No vision changes Ears:  No hearing difficulty Respiratory:                No cough or shortness of breath Pulmonary:                  No asthma or shortness of breath Cardiovascular:           No chest pain, palpitations, dyspnea on exertion Gastrointestinal:          No abdominal bloating, chronic diarrhea, constipations, masses, pain or hematochezia, + abdominal swelling  Genitourinary:             No hematuria, dysuria, abnormal vaginal discharge, pelvic pain, Menometrorrhagia Lymphatic:                   No swollen lymph nodes Musculoskeletal:         No muscle weakness Neurologic:                  No extremity weakness, syncope, seizure disorder Psychiatric:                  No history of depression, delusions or suicidal/homicidal ideation      Exam:         Vitals:  08/12/20   BP: (!) 159/77  Pulse: 65      Body mass index is 21.14 kg/m.   WDWN white/ black female in NAD   Lungs: CTA CV : RRR without murmur   Breast: exam done in sitting and lying position : No dimpling or retraction, no dominant mass, no spontaneous discharge, no axillary adenopathy Neck:  no thyromegaly Abdomen: soft ,firmness noted lower abdomen, ill  defined mass inferior to the umbilicus l>r Pelvic: tanner stage 5 , External genitalia: vulva /labia no lesions Urethra: no prolapse   Vagina: normal physiologic d/c, no blood , no laxity  Cervix: no lesions, no cervical motion tenderness   Uterus: normal size shape and contour, non-tender Adnexa: no mass,  non-tender   Rectovaginal:   Impression:    The encounter diagnosis was Complex ovarian cyst. Symptomatic 13x11 cm cyst R/o cancer vs benign adenoma     Plan:  After counseling the patient has elected to seek GYN / onc eval knowing that this mass may be malignant . She wishes for for me to do the surgery   I will try to perform L/S BSo and if unable I will convert to laparotomy BSO Risks have been explained to the patient including possible upstage of cancer if found at the time of surgery and ovarian capsule ruptures . All questions answered         Electronically signed by [name], [name], MD

## 2020-08-10 NOTE — H&P (Signed)
[name] is a 87 y.o. female here fL/s BSO , possible ex lap bso for symptomatic adnexal mass .  abdominal swelling seen by me  Pt with c/o of lower abdominal swelling for the last 6 yrs . [name] Thought it may have started after her husband passed and she was extremely constipated . She heard a "pop ' with straining   CTscan in 2020 failed to show anything .( record review - outside)    BM normal now G0, no pelvic prolapse . No vaginal bleeding  . She doesn't think she ever had a colonoscopy   06/11/20.  U/s done on 06/19/20:   Uterus anteverted   Fibroids seen:1)fundal=1.7cm     2)Rt fundal=1.5cm   Fluid filled endometrium=7.36m; walls: 0.20 + 0.21cm=0.41cm (4.154m   Midline complex cyst with septation(posterior to bladder & anterior to uterus)=13.1 x 7.56 x 11.3cm; septation=0.36cm   B/L ovaries not seen   No free fluid seen   CA-125 nl and HE-4 119 mildly elevated  Pt has elected not to get GYN/ ONC consultation for possible surgery that may involve cancer( despite my recommendation )   Past Medical History:  has a past medical history of Hypertension, Osteoarthritis, and Osteoporosis.  Past Surgical History:  has a past surgical history that includes Cataract extraction (Bilateral); Left Ankle Fx Repair (1998); and Right Wrist Fx Repair (2002). Family History: family history includes Cancer in her mother; Kidney failure in her father. Social History:  reports that she has never smoked. She has never used smokeless tobacco. OB/GYN History:          OB History     Gravida  0   Para  0   Term  0   Preterm  0   AB  0   Living  0      SAB  0   IAB  0   Ectopic  0   Molar  0   Multiple  0   Live Births  0             Allergies: has No Known Allergies. Medications:   Current Outpatient Medications:    aspirin 81 MG EC tablet, Take 81 mg by mouth once daily., Disp: , Rfl:    cyanocobalamin (VITAMIN B12) 1000 MCG tablet, Take 1,000 mcg by mouth once  daily., Disp: , Rfl:    diazePAM (VALIUM) 5 MG tablet, Take 1 tablet (5 mg total) by mouth 2 (two) times daily, Disp: 60 tablet, Rfl: 3   docusate (COLACE) 100 MG capsule, Take 100 mg by mouth 2 (two) times daily as needed., Disp: , Rfl:    Lactobac no.41/Bifidobact no.7 (PROBIOTIC-10 ORAL), Take by mouth, Disp: , Rfl:    propranoloL (INDERAL LA) 60 MG LA capsule, TAKE ONE CAPSULE BY MOUTH AT BEDTIME, Disp: 30 capsule, Rfl: 2   vitamin E 400 UNIT capsule, Take 400 Units by mouth once daily., Disp: , Rfl:    traMADol (ULTRAM) 50 mg tablet, Take 50 mg by mouth as needed (Patient not taking: No sig reported), Disp: , Rfl:    Review of Systems: General:                      No fatigue or weight loss Eyes:                           No vision changes Ears:  No hearing difficulty Respiratory:                No cough or shortness of breath Pulmonary:                  No asthma or shortness of breath Cardiovascular:           No chest pain, palpitations, dyspnea on exertion Gastrointestinal:          No abdominal bloating, chronic diarrhea, constipations, masses, pain or hematochezia, + abdominal swelling  Genitourinary:             No hematuria, dysuria, abnormal vaginal discharge, pelvic pain, Menometrorrhagia Lymphatic:                   No swollen lymph nodes Musculoskeletal:         No muscle weakness Neurologic:                  No extremity weakness, syncope, seizure disorder Psychiatric:                  No history of depression, delusions or suicidal/homicidal ideation      Exam:       Vitals:    07/05/20 1249  BP: (!) 163/71  Pulse: 65      Body mass index is 21.14 kg/m.   WDWN white/ black female in NAD   Lungs: CTA  CV : RRR without murmur   Breast: exam done in sitting and lying position : No dimpling or retraction, no dominant mass, no spontaneous discharge, no axillary adenopathy Neck:  no thyromegaly Abdomen: soft ,firmness noted lower abdomen,  ill defined mass inferior to the umbilicus l>r Pelvic: tanner stage 5 ,  External genitalia: vulva /labia no lesions Urethra: no prolapse   Vagina: normal physiologic d/c, no blood , no laxity  Cervix: no lesions, no cervical motion tenderness   Uterus: normal size shape and contour, non-tender Adnexa: no mass,  non-tender   Rectovaginal:   Impression:    The encounter diagnosis was Complex ovarian cyst. Symptomatic 13x11 cm cyst  R/o cancer vs benign adenoma     Plan:  After counseling the patient has elected to seek GYN / onc eval knowing that this mass may be malignant . She wishes for for me to do the surgery  I will try to perform L/S BSo and if unable I will convert to laparotomy BSO  Risks have been explained to the patient including possible upstage of cancer if found at the time of surgery and ovarian capsule ruptures . All questions answered

## 2020-08-23 ENCOUNTER — Other Ambulatory Visit: Payer: Self-pay

## 2020-08-23 ENCOUNTER — Encounter
Admission: RE | Admit: 2020-08-23 | Discharge: 2020-08-23 | Disposition: A | Discharge status: Home / Self Care | Payer: Medicare Other | Source: Ambulatory Visit | Attending: Obstetrics and Gynecology | Admitting: Obstetrics and Gynecology

## 2020-08-23 HISTORY — DX: Other complications of anesthesia, initial encounter: T88.59XA

## 2020-08-23 NOTE — Pre-Procedure Instructions (Signed)
Patient medically cleared by PCP, Deborra Medina MD

## 2020-08-23 NOTE — Patient Instructions (Signed)
Your procedure is scheduled on: 09/02/20 Monday Report to [facility]. To find out your arrival time please call [phone] between 1PM - 3PM on 08/30/20 Friday.  Remember: Instructions that are not followed completely may result in serious medical risk, up to and including death, or upon the discretion of your surgeon and anesthesiologist your surgery may need to be rescheduled.     _X__ 1. Do not eat food after midnight the night before your procedure.                 No gum chewing or hard candies. You may drink clear liquids up to 2 hours                 before you are scheduled to arrive for your surgery-                  Clear Liquids include:  water, apple juice without pulp, clear carbohydrate                 drink such as Clearfast or Gatorade, Black Coffee or Tea (Do not add                 anything to coffee or tea). Diabetics water only   DRINK THE ENSURE "CLEAR" DRINK 2 HOURS BEFORE ARRIVING FOR SURGERY ON Monday 09/02/20   __X__2.  On the morning of surgery brush your teeth with toothpaste and water, you                 may rinse your mouth with mouthwash if you wish.  Do not swallow any              toothpaste of mouthwash.     _X__ 3.  No Alcohol for 24 hours before or after surgery.   _X__ 4.  Do Not Smoke or use e-cigarettes For 24 Hours Prior to Your Surgery.                 Do not use any chewable tobacco products for at least 6 hours prior to                 surgery.  ____  5.  Bring all medications with you on the day of surgery if instructed.   __X__  6.  Notify your doctor if there is any change in your medical condition      (cold, fever, infections).     Do not wear jewelry, make-up, hairpins, clips or nail polish. Do not wear lotions, powders, or perfumes.  Do not shave body hair 48 hours prior to surgery. Men may shave face and neck. Do not bring valuables to the hospital.    [facility] is not  responsible for any belongings or valuables.  Contacts, dentures/partials or body piercings may not be worn into surgery. Bring a case for your contacts, glasses or hearing aids, a denture cup will be supplied. Leave your suitcase in the car. After surgery it may be brought to your room. For patients admitted to the hospital, discharge time is determined by your treatment team.   Patients discharged the day of surgery will not be allowed to drive home.   Please read over the following fact sheets that you were given:   CHG SOAP, ENSURE [place]  __X__ Take these medicines the morning of surgery with A SIP OF WATER:    1. amLODipine (NORVASC)  5 MG tablet  2.   3.   4.  5.  6.  ____ Fleet Enema (as directed)   __X__ Use CHG Soap/SAGE wipes as directed  ____ Use inhalers on the day of surgery  ____ Stop metformin/Janumet/Farxiga 2 days prior to surgery    ____ Take 1/2 of usual insulin dose the night before surgery. No insulin the morning          of surgery.   ____ Stop Blood Thinners Coumadin/Plavix/Xarelto/Pleta/Pradaxa/Eliquis/Effient/Aspirin  on   Or contact your Surgeon, Cardiologist or Medical Doctor regarding  ability to stop your blood thinners  __X__ Stop Anti-inflammatories 7 days before surgery such as Advil, Ibuprofen, Motrin,  BC or Goodies Powder, Naprosyn, Naproxen, Aleve, Aspirin   You may take Tylenol if needed  __X__ Stop all herbal supplements, fish oil or vitamin E until after surgery. Stop Vitamin E, Lutein, Ginkgo Biloba, Garlic, and Vitamin C Monday 08/26/20.   ____ Bring C-Pap to the hospital.

## 2020-08-29 ENCOUNTER — Encounter
Admission: RE | Admit: 2020-08-29 | Discharge: 2020-08-29 | Disposition: A | Discharge status: Home / Self Care | Payer: Medicare Other | Source: Ambulatory Visit | Attending: Obstetrics and Gynecology | Admitting: Obstetrics and Gynecology

## 2020-08-29 ENCOUNTER — Other Ambulatory Visit: Admission: RE | Admit: 2020-08-29 | Payer: Medicare Other | Source: Ambulatory Visit

## 2020-08-29 ENCOUNTER — Other Ambulatory Visit: Payer: Self-pay

## 2020-08-29 DIAGNOSIS — Z01812 Encounter for preprocedural laboratory examination: Secondary | ICD-10-CM | POA: Diagnosis not present

## 2020-08-29 DIAGNOSIS — Z20822 Contact with and (suspected) exposure to covid-19: Secondary | ICD-10-CM | POA: Diagnosis not present

## 2020-08-29 LAB — BASIC METABOLIC PANEL
Anion gap: 5 (ref 5–15)
BUN: 16 mg/dL (ref 8–23)
CO2: 29 mmol/L (ref 22–32)
Calcium: 9.2 mg/dL (ref 8.9–10.3)
Chloride: 100 mmol/L (ref 98–111)
Creatinine, Ser: 0.86 mg/dL (ref 0.44–1.00)
GFR, Estimated: >60 mL/min (ref >60)
Glucose, Bld: 100 mg/dL — ABNORMAL HIGH (ref 70–99)
Potassium: 4.6 mmol/L (ref 3.5–5.1)
Sodium: 134 mmol/L — ABNORMAL LOW (ref 135–145)

## 2020-08-29 LAB — CBC
HCT: 36.9 % (ref 36.0–46.0)
Hemoglobin: 12.7 g/dL (ref 12.0–15.0)
MCH: 34.1 pg — ABNORMAL HIGH (ref 26.0–34.0)
MCHC: 34.4 g/dL (ref 30.0–36.0)
MCV: 99.2 fL (ref 80.0–100.0)
Platelets: 304 10*3/uL (ref 150–400)
RBC: 3.72 MIL/uL — ABNORMAL LOW (ref 3.87–5.11)
RDW: 12.6 % (ref 11.5–15.5)
WBC: 9.2 10*3/uL (ref 4.0–10.5)
nRBC: 0 % (ref 0.0–0.2)

## 2020-08-29 LAB — TYPE AND SCREEN
ABO/RH(D): A POS
Antibody Screen: NEGATIVE

## 2020-08-30 LAB — SARS CORONAVIRUS 2 (TAT 6-24 HRS): SARS Coronavirus 2: NEGATIVE

## 2020-09-02 ENCOUNTER — Inpatient Hospital Stay: Payer: Medicare Other | Admitting: Anesthesiology

## 2020-09-02 ENCOUNTER — Encounter: Payer: Self-pay | Admitting: Obstetrics and Gynecology

## 2020-09-02 ENCOUNTER — Other Ambulatory Visit: Payer: Self-pay

## 2020-09-02 ENCOUNTER — Ambulatory Visit
Admission: RE | Admit: 2020-09-02 | Discharge: 2020-09-02 | Disposition: A | Discharge status: Home / Self Care | Payer: Medicare Other | Attending: Obstetrics and Gynecology | Admitting: Obstetrics and Gynecology

## 2020-09-02 ENCOUNTER — Encounter
Admission: RE | Disposition: A | Discharge status: Home / Self Care | Payer: Self-pay | Source: Home / Self Care | Attending: Obstetrics and Gynecology

## 2020-09-02 ENCOUNTER — Inpatient Hospital Stay: Payer: Medicare Other | Admitting: Urgent Care

## 2020-09-02 DIAGNOSIS — Z7982 Long term (current) use of aspirin: Secondary | ICD-10-CM | POA: Insufficient documentation

## 2020-09-02 DIAGNOSIS — Z79899 Other long term (current) drug therapy: Secondary | ICD-10-CM | POA: Insufficient documentation

## 2020-09-02 DIAGNOSIS — N83292 Other ovarian cyst, left side: Secondary | ICD-10-CM | POA: Insufficient documentation

## 2020-09-02 DIAGNOSIS — N838 Other noninflammatory disorders of ovary, fallopian tube and broad ligament: Secondary | ICD-10-CM | POA: Diagnosis not present

## 2020-09-02 DIAGNOSIS — M810 Age-related osteoporosis without current pathological fracture: Secondary | ICD-10-CM | POA: Insufficient documentation

## 2020-09-02 DIAGNOSIS — N83291 Other ovarian cyst, right side: Secondary | ICD-10-CM | POA: Diagnosis not present

## 2020-09-02 DIAGNOSIS — R935 Abnormal findings on diagnostic imaging of other abdominal regions, including retroperitoneum: Secondary | ICD-10-CM | POA: Diagnosis not present

## 2020-09-02 DIAGNOSIS — I10 Essential (primary) hypertension: Secondary | ICD-10-CM | POA: Diagnosis not present

## 2020-09-02 DIAGNOSIS — M1990 Unspecified osteoarthritis, unspecified site: Secondary | ICD-10-CM | POA: Insufficient documentation

## 2020-09-02 DIAGNOSIS — N83202 Unspecified ovarian cyst, left side: Secondary | ICD-10-CM | POA: Diagnosis present

## 2020-09-02 HISTORY — PX: LAPAROSCOPIC BILATERAL SALPINGO OOPHERECTOMY: SHX5890

## 2020-09-02 LAB — ABO/RH: ABO/RH(D): A POS

## 2020-09-02 SURGERY — SALPINGO-OOPHORECTOMY, BILATERAL, LAPAROSCOPIC
Anesthesia: General | Laterality: Bilateral

## 2020-09-02 MED ORDER — PROPOFOL 10 MG/ML IV BOLUS
INTRAVENOUS | Status: AC
  Filled 2020-09-02: qty 20

## 2020-09-02 MED ORDER — OXYCODONE HCL 5 MG PO TABS: 5.0000 mg | ORAL_TABLET | Freq: Once | ORAL | Status: DC | PRN

## 2020-09-02 MED ORDER — KETOROLAC TROMETHAMINE 30 MG/ML IJ SOLN
INTRAMUSCULAR | Status: DC | PRN
  Administered 2020-09-02: 15 mg via INTRAVENOUS

## 2020-09-02 MED ORDER — ONDANSETRON 4 MG PO TBDP
4.0000 mg | ORAL_TABLET | Freq: Four times a day (QID) | ORAL | Status: DC | PRN
Start: 2020-09-02 — End: 2020-09-02

## 2020-09-02 MED ORDER — FENTANYL CITRATE (PF) 100 MCG/2ML IJ SOLN
INTRAMUSCULAR | Status: DC | PRN
  Administered 2020-09-02 (×2): 50 ug via INTRAVENOUS

## 2020-09-02 MED ORDER — FENTANYL CITRATE (PF) 100 MCG/2ML IJ SOLN
INTRAMUSCULAR | Status: AC
  Filled 2020-09-02: qty 2

## 2020-09-02 MED ORDER — ONDANSETRON HCL 4 MG/2ML IJ SOLN: 4.0000 mg | Freq: Once | INTRAMUSCULAR | Status: DC | PRN

## 2020-09-02 MED ORDER — SODIUM CHLORIDE (PF) 0.9 % IJ SOLN
INTRAMUSCULAR | Status: AC
  Filled 2020-09-02: qty 50

## 2020-09-02 MED ORDER — PROPOFOL 10 MG/ML IV BOLUS
INTRAVENOUS | Status: DC | PRN
  Administered 2020-09-02: 100 mg via INTRAVENOUS

## 2020-09-02 MED ORDER — FENTANYL CITRATE (PF) 100 MCG/2ML IJ SOLN: 25.0000 ug | INTRAMUSCULAR | Status: DC | PRN

## 2020-09-02 MED ORDER — SUGAMMADEX SODIUM 200 MG/2ML IV SOLN
INTRAVENOUS | Status: DC | PRN
  Administered 2020-09-02: 150 mg via INTRAVENOUS

## 2020-09-02 MED ORDER — GABAPENTIN 300 MG PO CAPS
ORAL_CAPSULE | ORAL | Status: AC
  Administered 2020-09-02: 300 mg via ORAL
  Filled 2020-09-02: qty 1

## 2020-09-02 MED ORDER — ROCURONIUM BROMIDE 10 MG/ML (PF) SYRINGE
PREFILLED_SYRINGE | INTRAVENOUS | Status: AC
  Filled 2020-09-02: qty 10

## 2020-09-02 MED ORDER — SILVER NITRATE-POT NITRATE 75-25 % EX MISC
CUTANEOUS | Status: AC
  Filled 2020-09-02: qty 10

## 2020-09-02 MED ORDER — GABAPENTIN 300 MG PO CAPS: 300.0000 mg | ORAL_CAPSULE | ORAL | Status: AC

## 2020-09-02 MED ORDER — EPHEDRINE 5 MG/ML INJ
INTRAVENOUS | Status: AC
  Filled 2020-09-02: qty 5

## 2020-09-02 MED ORDER — EPHEDRINE SULFATE 50 MG/ML IJ SOLN
INTRAMUSCULAR | Status: DC | PRN
  Administered 2020-09-02: 10 mg via INTRAVENOUS

## 2020-09-02 MED ORDER — ACETAMINOPHEN 500 MG PO TABS: 1000.0000 mg | ORAL_TABLET | ORAL | Status: AC

## 2020-09-02 MED ORDER — GLYCOPYRROLATE 0.2 MG/ML IJ SOLN
INTRAMUSCULAR | Status: DC | PRN
  Administered 2020-09-02: .2 mg via INTRAVENOUS

## 2020-09-02 MED ORDER — BUPIVACAINE LIPOSOME 1.3 % IJ SUSP
INTRAMUSCULAR | Status: AC
  Filled 2020-09-02: qty 20

## 2020-09-02 MED ORDER — DEXAMETHASONE SODIUM PHOSPHATE 10 MG/ML IJ SOLN
INTRAMUSCULAR | Status: DC | PRN
Start: 2020-09-02 — End: 2020-09-02
  Administered 2020-09-02: 10 mg via INTRAVENOUS

## 2020-09-02 MED ORDER — LIDOCAINE HCL (CARDIAC) PF 100 MG/5ML IV SOSY
PREFILLED_SYRINGE | INTRAVENOUS | Status: DC | PRN
  Administered 2020-09-02: 50 mg via INTRAVENOUS

## 2020-09-02 MED ORDER — ORAL CARE MOUTH RINSE: 15.0000 mL | Freq: Once | OROMUCOSAL | Status: AC

## 2020-09-02 MED ORDER — ACETAMINOPHEN 500 MG PO TABS
ORAL_TABLET | ORAL | Status: AC
  Administered 2020-09-02: 1000 mg via ORAL
  Filled 2020-09-02: qty 2

## 2020-09-02 MED ORDER — ONDANSETRON HCL 4 MG/2ML IJ SOLN
INTRAMUSCULAR | Status: DC | PRN
  Administered 2020-09-02: 4 mg via INTRAVENOUS

## 2020-09-02 MED ORDER — BUPIVACAINE HCL (PF) 0.5 % IJ SOLN
INTRAMUSCULAR | Status: AC
  Filled 2020-09-02: qty 30

## 2020-09-02 MED ORDER — ROCURONIUM BROMIDE 100 MG/10ML IV SOLN
INTRAVENOUS | Status: DC | PRN
Start: 2020-09-02 — End: 2020-09-02
  Administered 2020-09-02: 30 mg via INTRAVENOUS

## 2020-09-02 MED ORDER — BUPIVACAINE HCL 0.5 % IJ SOLN
INTRAMUSCULAR | Status: DC | PRN
  Administered 2020-09-02: 12 mL

## 2020-09-02 MED ORDER — CHLORHEXIDINE GLUCONATE 0.12 % MT SOLN: 15.0000 mL | Freq: Once | OROMUCOSAL | Status: AC

## 2020-09-02 MED ORDER — OXYCODONE-ACETAMINOPHEN 5-325 MG PO TABS
1.0000 | ORAL_TABLET | ORAL | Status: DC | PRN
Start: 2020-09-02 — End: 2020-09-02

## 2020-09-02 MED ORDER — FAMOTIDINE 20 MG PO TABS: 20.0000 mg | ORAL_TABLET | Freq: Once | ORAL | Status: AC

## 2020-09-02 MED ORDER — GLYCOPYRROLATE 0.2 MG/ML IJ SOLN
INTRAMUSCULAR | Status: AC
  Filled 2020-09-02: qty 1

## 2020-09-02 MED ORDER — LACTATED RINGERS IV SOLN: INTRAVENOUS | Status: DC

## 2020-09-02 MED ORDER — FAMOTIDINE 20 MG PO TABS
ORAL_TABLET | ORAL | Status: AC
  Administered 2020-09-02: 20 mg via ORAL
  Filled 2020-09-02: qty 1

## 2020-09-02 MED ORDER — CHLORHEXIDINE GLUCONATE 0.12 % MT SOLN
OROMUCOSAL | Status: AC
  Administered 2020-09-02: 15 mL via OROMUCOSAL
  Filled 2020-09-02: qty 15

## 2020-09-02 MED ORDER — OXYCODONE HCL 5 MG/5ML PO SOLN: 5.0000 mg | Freq: Once | ORAL | Status: DC | PRN

## 2020-09-02 MED ORDER — POVIDONE-IODINE 10 % EX SWAB: 2.0000 "application " | Freq: Once | CUTANEOUS | Status: DC

## 2020-09-02 SURGICAL SUPPLY — 70 items
APL PRP STRL LF DISP 70% ISPRP (MISCELLANEOUS) ×1
APL SRG 38 LTWT LNG FL B (MISCELLANEOUS)
APPLICATOR ARISTA FLEXITIP XL (MISCELLANEOUS) IMPLANT
BAG DRN RND TRDRP ANRFLXCHMBR (UROLOGICAL SUPPLIES) ×1
BAG SPEC RTRVL LRG 6X4 10 (ENDOMECHANICALS) ×1
BAG URINE DRAIN 2000ML AR STRL (UROLOGICAL SUPPLIES) ×2 IMPLANT
BLADE SURG SZ11 CARB STEEL (BLADE) ×2 IMPLANT
CANISTER SUCT 1200ML W/VALVE (MISCELLANEOUS) ×2 IMPLANT
CATH ROBINSON RED A/P 16FR (CATHETERS) ×2 IMPLANT
CHLORAPREP W/TINT 26 (MISCELLANEOUS) ×2 IMPLANT
DRAPE LAPAROTOMY TRNSV 106X77 (MISCELLANEOUS) ×2 IMPLANT
DRSG TEGADERM 2-3/8X2-3/4 SM (GAUZE/BANDAGES/DRESSINGS) ×2 IMPLANT
DRSG TELFA 3X8 NADH (GAUZE/BANDAGES/DRESSINGS) ×2 IMPLANT
ELECT BLADE 6 FLAT ULTRCLN (ELECTRODE) IMPLANT
ELECT REM PT RETURN 9FT ADLT (ELECTROSURGICAL) ×2
ELECTRODE REM PT RTRN 9FT ADLT (ELECTROSURGICAL) ×1 IMPLANT
GAUZE 4X4 16PLY ~~LOC~~+RFID DBL (SPONGE) ×4 IMPLANT
GLOVE SURG SYN 8.0 (GLOVE) ×2 IMPLANT
GLOVE SURG UNDER LTX SZ8 (GLOVE) ×2 IMPLANT
GOWN STRL REUS W/ TWL LRG LVL3 (GOWN DISPOSABLE) ×2 IMPLANT
GOWN STRL REUS W/ TWL XL LVL3 (GOWN DISPOSABLE) ×1 IMPLANT
GOWN STRL REUS W/TWL LRG LVL3 (GOWN DISPOSABLE) ×4
GOWN STRL REUS W/TWL XL LVL3 (GOWN DISPOSABLE) ×2
GRASPER SUT TROCAR 14GX15 (MISCELLANEOUS) ×2 IMPLANT
HEMOSTAT ARISTA ABSORB 3G PWDR (HEMOSTASIS) IMPLANT
IRRIGATION STRYKERFLOW (MISCELLANEOUS) ×1 IMPLANT
IRRIGATOR STRYKERFLOW (MISCELLANEOUS) ×2
IV NS 1000ML (IV SOLUTION) ×2
IV NS 1000ML BAXH (IV SOLUTION) ×1 IMPLANT
KIT PINK PAD W/HEAD ARE REST (MISCELLANEOUS) ×2
KIT PINK PAD W/HEAD ARM REST (MISCELLANEOUS) ×1 IMPLANT
KIT TURNOVER CYSTO (KITS) ×2 IMPLANT
LABEL OR SOLS (LABEL) ×2 IMPLANT
MANIFOLD NEPTUNE II (INSTRUMENTS) ×2 IMPLANT
NEEDLE HYPO 21X1.5 SAFETY (NEEDLE) ×2 IMPLANT
NS IRRIG 500ML POUR BTL (IV SOLUTION) ×2 IMPLANT
PACK BASIN MAJOR ARMC (MISCELLANEOUS) ×2 IMPLANT
PACK GYN LAPAROSCOPIC (MISCELLANEOUS) ×2 IMPLANT
PAD OB MATERNITY 4.3X12.25 (PERSONAL CARE ITEMS) ×2 IMPLANT
PAD PREP 24X41 OB/GYN DISP (PERSONAL CARE ITEMS) ×2 IMPLANT
POUCH SPECIMEN RETRIEVAL 10MM (ENDOMECHANICALS) ×2 IMPLANT
SCRUB EXIDINE 4% CHG 4OZ (MISCELLANEOUS) ×2 IMPLANT
SET TUBE SMOKE EVAC HIGH FLOW (TUBING) ×2 IMPLANT
SHEARS HARMONIC ACE PLUS 36CM (ENDOMECHANICALS) ×2 IMPLANT
SLEEVE ENDOPATH XCEL 5M (ENDOMECHANICALS) ×2 IMPLANT
SPONGE GAUZE 2X2 8PLY STRL LF (GAUZE/BANDAGES/DRESSINGS) ×2 IMPLANT
SPONGE KITTNER 5P (MISCELLANEOUS) IMPLANT
SPONGE T-LAP 18X18 ~~LOC~~+RFID (SPONGE) IMPLANT
STAPLER INSORB 30 2030 C-SECTI (MISCELLANEOUS) IMPLANT
STAPLER SKIN PROX 35W (STAPLE) ×2 IMPLANT
STRIP CLOSURE SKIN 1/4X4 (GAUZE/BANDAGES/DRESSINGS) ×2 IMPLANT
SUT VIC AB 0 CT1 27 (SUTURE)
SUT VIC AB 0 CT1 27XCR 8 STRN (SUTURE) IMPLANT
SUT VIC AB 0 CT1 36 (SUTURE) IMPLANT
SUT VIC AB 2-0 CT1 (SUTURE) IMPLANT
SUT VIC AB 2-0 SH 27 (SUTURE)
SUT VIC AB 2-0 SH 27XBRD (SUTURE) IMPLANT
SUT VIC AB 2-0 UR6 27 (SUTURE) ×2 IMPLANT
SUT VIC AB 4-0 SH 27 (SUTURE) ×2
SUT VIC AB 4-0 SH 27XANBCTRL (SUTURE) ×1 IMPLANT
SUT VICRYL PLUS ABS 0 54 (SUTURE) ×2 IMPLANT
SWABSTK COMLB BENZOIN TINCTURE (MISCELLANEOUS) ×2 IMPLANT
SYR 10ML LL (SYRINGE) ×2 IMPLANT
SYR 30ML LL (SYRINGE) ×2 IMPLANT
SYR BULB IRRIG 60ML STRL (SYRINGE) ×2 IMPLANT
TRAY FOLEY MTR SLVR 16FR STAT (SET/KITS/TRAYS/PACK) IMPLANT
TROCAR ENDO BLADELESS 11MM (ENDOMECHANICALS) ×2 IMPLANT
TROCAR XCEL NON-BLD 5MMX100MML (ENDOMECHANICALS) ×2 IMPLANT
WATER STERILE IRR 1000ML POUR (IV SOLUTION) ×2 IMPLANT
WATER STERILE IRR 500ML POUR (IV SOLUTION) ×2 IMPLANT

## 2020-09-02 NOTE — Anesthesia Preprocedure Evaluation (Signed)
Anesthesia Evaluation  Patient identified by MRN, date of birth, ID band Patient awake    Reviewed: Allergy & Precautions, NPO status , Patient's Chart, lab work & pertinent test results  History of Anesthesia Complications Negative for: history of anesthetic complications  Airway Mallampati: II  TM Distance: >3 FB Neck ROM: Full    Dental  (+) Upper Dentures, Lower Dentures   Pulmonary neg pulmonary ROS, neg sleep apnea, neg COPD,    breath sounds clear to auscultation- rhonchi (-) wheezing      Cardiovascular hypertension, Pt. on medications (-) CAD, (-) Past MI, (-) Cardiac Stents and (-) CABG  Rhythm:Regular Rate:Normal - Systolic murmurs and - Diastolic murmurs    Neuro/Psych neg Seizures PSYCHIATRIC DISORDERS Anxiety Depression negative neurological ROS     GI/Hepatic negative GI ROS, Neg liver ROS,   Endo/Other  negative endocrine ROSneg diabetes  Renal/GU negative Renal ROS     Musculoskeletal  (+) Arthritis ,   Abdominal (+) - obese,   Peds  Hematology negative hematology ROS (+)   Anesthesia Other Findings Past Medical History: No date: Anxiety No date: Arthritis     Comment:  "may have a touch; all over I guess" (10/11/2017) No date: Back pain     Comment:  "all my back" (10/11/2017) No date: Basal cell carcinoma     Comment:  "have had many taken off; burned" (10/11/2017) 06/2010: Chronic orthostatic hypotension     Comment:  with pprior syncopal event,  did not tolelrate florinef               trial No date: Complication of anesthesia     Comment:  2019 ws very confused for several days after anesthesia 08/12/2013: Constipation No date: Depression No date: Hyperlipidemia No date: Hypertension 10/09/2017: Tibia/fibula fracture No date: Urinary frequency   Reproductive/Obstetrics                             Anesthesia Physical Anesthesia Plan  ASA: 2  Anesthesia  Plan: General   Post-op Pain Management:    Induction: Intravenous  PONV Risk Score and Plan: 2 and Ondansetron and Dexamethasone  Airway Management Planned: Oral ETT  Additional Equipment:   Intra-op Plan:   Post-operative Plan: Extubation in OR  Informed Consent: I have reviewed the patients History and Physical, chart, labs and discussed the procedure including the risks, benefits and alternatives for the proposed anesthesia with the patient or authorized representative who has indicated his/her understanding and acceptance.     Dental advisory given  Plan Discussed with: CRNA and Anesthesiologist  Anesthesia Plan Comments:         Anesthesia Quick Evaluation

## 2020-09-02 NOTE — Transfer of Care (Signed)
Immediate Anesthesia Transfer of Care Note  Patient: [name]  Procedure(s) Performed: LAPAROSCOPIC BILATERAL SALPINGO OOPHORECTOMY (Bilateral)  Patient Location: PACU  Anesthesia Type:General  Level of Consciousness: drowsy and patient cooperative  Airway & Oxygen Therapy: Patient Spontanous Breathing and Patient connected to face mask oxygen  Post-op Assessment: Report given to RN and Post -op Vital signs reviewed and stable  Post vital signs: Reviewed and stable  Last Vitals:  Vitals Value Taken Time  BP 161/62 09/02/20 1200  Temp 35.8 C 09/02/20 1200  Pulse 55 09/02/20 1205  Resp 12 09/02/20 1205  SpO2 100 % 09/02/20 1205  Vitals shown include unvalidated device data.  Last Pain:  Vitals:   09/02/20 1200  TempSrc:   PainSc: Asleep         Complications: No notable events documented.

## 2020-09-02 NOTE — Progress Notes (Signed)
DR SCHERMERHORN IS ALLOWING THE PATIENT TO BE DISCHARGED HOME WITH THE CAREGIVER PINKY PRICE. PATIENT IS DOING VERY WELL, DRESSINGS ARE CLEAN DRY INTACT, AND NO BLEEDING ON PERIPAD. DENIES PAIN

## 2020-09-02 NOTE — Brief Op Note (Signed)
09/02/2020  11:48 AM  PATIENT:  [name]  87 y.o. female  PRE-OPERATIVE DIAGNOSIS:  complex adnexal mass  POST-OPERATIVE DIAGNOSIS:  normal fallopian tubes and ovaries   PROCEDURE:  Procedure(s): EXPLORATORY LAPAROTOMY (Bilateral) OPEN SALPINGO OOPHORECTOMY (Bilateral) LAPAROSCOPIC BILATERAL SALPINGO OOPHORECTOMY (Bilateral)  SURGEON:  Surgeon(s) and Role:    * Brandyn Lowrey, [name], MD - Primary    * Benjaman Kindler, MD - Assisting  PHYSICIAN ASSISTANT:   ASSISTANTS: none   ANESTHESIA:   general  EBL:  5 mL  iof 700 cc UO 1000cc  BLOOD ADMINISTERED:none 3 DRAINS: none   LOCAL MEDICATIONS USED:  MARCAINE     SPECIMEN:  Source of Specimen:  bilateral salpingoophorectomy  DISPOSITION OF SPECIMEN:  PATHOLOGY  COUNTS:  YES  TOURNIQUET:  * No tourniquets in log *  DICTATION: .Other Dictation: Dictation Number verbal  PLAN OF CARE: Discharge to home after PACU  PATIENT DISPOSITION:  PACU - hemodynamically stable.   Delay start of Pharmacological VTE agent (>24hrs) due to surgical blood loss or risk of bleeding: not applicable

## 2020-09-02 NOTE — Progress Notes (Signed)
Pt scheduled for L/S BSO possible  exlap  and bilateral salpingoophorectomy . Labs reviewed . All questions answered . Proceed

## 2020-09-02 NOTE — Anesthesia Procedure Notes (Signed)
Procedure Name: Intubation Date/Time: 09/02/2020 10:35 AM Performed by: Jonna Clark, CRNA Pre-anesthesia Checklist: Patient identified, Patient being monitored, Timeout performed, Emergency Drugs available and Suction available Patient Re-evaluated:Patient Re-evaluated prior to induction Oxygen Delivery Method: Circle system utilized Preoxygenation: Pre-oxygenation with 100% oxygen Induction Type: IV induction Ventilation: Mask ventilation without difficulty Laryngoscope Size: Miller and 2 Grade View: Grade I Tube type: Oral Tube size: 6.5 mm Number of attempts: 1 Airway Equipment and Method: Stylet Placement Confirmation: ETT inserted through vocal cords under direct vision, positive ETCO2 and breath sounds checked- equal and bilateral Secured at: 21 cm Tube secured with: Tape Dental Injury: Teeth and Oropharynx as per pre-operative assessment

## 2020-09-02 NOTE — Op Note (Signed)
NAMELIAHONA, DESLAURIERS MEDICAL RECORD NO: [id number] ACCOUNT NO: [id number] DATE OF BIRTH: 29-Apr-1926 FACILITY: [facility] LOCATION: [facility] PHYSICIAN: Boykin Nearing, MD  Operative Report   DATE OF PROCEDURE: 09/02/2020  PREOPERATIVE DIAGNOSIS:  Left adnexal mass.  POSTOPERATIVE DIAGNOSES:  1.  No evidence of adnexal mass. 2.  Small fundal uterine fibroid.  PROCEDURE:  Laparoscopic bilateral salpingo-oophorectomy.  SURGEON:  Boykin Nearing, MD  FIRST ASSISTANT:  Benjaman Kindler, MD  ANESTHESIA:  General endotracheal anesthesia.  INDICATIONS:  This is a 87 year old, gravida 0, patient with a several-month history of lower abdominal bloating and cramping.  Ultrasound in the clinic shows a 13 x 11 cm adnexal mass with a septation.  HE4 values elevated mildly.  DESCRIPTION OF PROCEDURE:  After adequate general endotracheal anesthesia, the patient was placed in the dorsal supine position.  The patient's abdomen, perineum, and vagina were prepped and draped in normal sterile fashion.  Timeout was performed.   Straight catheterization of the bladder.  Foley catheter was placed, and a sponge stick was placed in the vagina to be used for uterine manipulation during the procedure.  Gloves and gown were changed.  Attention was directed to the patient's abdomen  where a 5 mm incision was made at Palmer's point 3 cm inferior to the left costal ridge midclavicular.  A 5 mm laparoscope was advanced into the abdominal cavity under direct visualization with Optiview cannula.  The patient's abdomen was insufflated.   Second port placement was placed 3 cm medial to the right anterior iliac spine, and a 5 mm trocar was advanced under direct visualization.  Initial impression revealed a uterus that appeared normal except for a 2 x 1 cm fundal fibroid pedunculated.  The  left ovary was slightly adhesed into the left ovarian fossa, but after removal of the adhesions, the left fallopian tube  and ovary appeared normal.  The right fallopian tube and ovary appeared normal as well.  Peristaltic activity was seen bilaterally  from the ureters.  Upper abdomen appeared normal.  There was no evidence of the previously described 13 x 11 cm mass.  The patient was placed in Trendelenburg, and attention was directed to the patient's left adnexa where the left infundibulopelvic  ligament was cauterized, transected, and the left fallopian tube and ovary were removed with a Harmonic scalpel.  Good hemostasis was noted.  Similar procedure was repeated on the patient's right adnexa.  The Harmonic scalpel was used to remove the right  fallopian tube and ovary.  Both tubes and ovaries were placed in an Endobag and removed through the left lower port site.  Intra-abdominal pressure was lowered to 7 mmHg with good hemostasis noted at the pedicle sites.  The fascia was then closed with a  Carter-Thomason cone and PMI apparatus with good closure of the fascia.  Rest of the carbon dioxide was removed from the patient's abdomen, and all cannulas were removed, and all skin incisions were closed with interrupted 4-0 Vicryl suture.  Sponge  stick was removed, and the Foley catheter was removed.  Intraoperative fluids 700 mL.  Estimated blood loss minimal.  Urine output 1000 mL. The patient did receive 15 mg IV Toradol at the end of the case.  The patient tolerated well.  There were no  complications.   ROH D: 09/02/2020 12:24:58 pm T: 09/02/2020 1:15:00 pm  JOB: [id number]

## 2020-09-02 NOTE — Discharge Instructions (Signed)

## 2020-09-03 ENCOUNTER — Encounter: Payer: Self-pay | Admitting: Obstetrics and Gynecology

## 2020-09-03 LAB — CYTOLOGY - NON PAP

## 2020-09-03 LAB — SURGICAL PATHOLOGY

## 2020-09-03 NOTE — Anesthesia Postprocedure Evaluation (Signed)
Anesthesia Post Note  Patient: [name]  Procedure(s) Performed: LAPAROSCOPIC BILATERAL SALPINGO OOPHORECTOMY (Bilateral)  Patient location during evaluation: PACU Anesthesia Type: General Level of consciousness: awake and alert and oriented Pain management: pain level controlled Vital Signs Assessment: post-procedure vital signs reviewed and stable Respiratory status: spontaneous breathing, nonlabored ventilation and respiratory function stable Cardiovascular status: blood pressure returned to baseline and stable Postop Assessment: no signs of nausea or vomiting Anesthetic complications: no   No notable events documented.   Last Vitals:  Vitals:   09/02/20 1314 09/02/20 1346  BP: (!) 160/69 (!) 147/73  Pulse: (!) 58 (!) 57  Resp: 14 17  Temp: (!) 36.4 C (!) 36.4 C  SpO2: 99% 100%    Last Pain:  Vitals:   09/02/20 1346  TempSrc: Temporal  PainSc: 0-No pain                 Murtaza Shell

## 2020-09-10 ENCOUNTER — Encounter: Payer: Self-pay | Admitting: Obstetrics and Gynecology

## 2020-09-10 MED ORDER — SODIUM CHLORIDE 0.9 % IR SOLN
Status: DC | PRN
  Administered 2020-09-02: 1000 mL

## 2020-09-11 ENCOUNTER — Other Ambulatory Visit: Payer: Self-pay | Admitting: Internal Medicine

## 2020-09-11 ENCOUNTER — Encounter: Payer: Self-pay | Admitting: Obstetrics and Gynecology

## 2020-09-11 NOTE — Telephone Encounter (Signed)
RX Refill:valium Last Seen:07-26-20 Last ordered:03-16-20

## 2020-09-17 DIAGNOSIS — R109 Unspecified abdominal pain: Secondary | ICD-10-CM | POA: Diagnosis not present

## 2020-09-17 DIAGNOSIS — R140 Abdominal distension (gaseous): Secondary | ICD-10-CM | POA: Diagnosis not present

## 2020-09-25 ENCOUNTER — Telehealth: Payer: Self-pay | Admitting: Internal Medicine

## 2020-09-25 NOTE — Telephone Encounter (Signed)
Patient is calling to schedule an appointment with Dr.Tullo discuss her surgery she had in August.Offered to schedule patient for next opening on 10/29/20 but patient would like to be seen sooner.Patient is not in pain but she has questions for Dr.Tullo and wants to know if she can call her or see her for a 15 minute visit.Please advise.

## 2020-09-26 ENCOUNTER — Other Ambulatory Visit: Payer: Self-pay | Admitting: Obstetrics and Gynecology

## 2020-09-26 ENCOUNTER — Other Ambulatory Visit (HOSPITAL_BASED_OUTPATIENT_CLINIC_OR_DEPARTMENT_OTHER): Payer: Self-pay | Admitting: Obstetrics and Gynecology

## 2020-09-26 DIAGNOSIS — R1900 Intra-abdominal and pelvic swelling, mass and lump, unspecified site: Secondary | ICD-10-CM

## 2020-09-26 DIAGNOSIS — R140 Abdominal distension (gaseous): Secondary | ICD-10-CM

## 2020-09-26 DIAGNOSIS — R109 Unspecified abdominal pain: Secondary | ICD-10-CM

## 2020-09-26 NOTE — Progress Notes (Signed)
09/27/2020 3:47 PM   [name] 1926/08/12 [id number]  Referring provider: Crecencio Mc, MD [place],  [place] [address]  Chief Complaint  Patient presents with   Follow-up   Urological history: 1. OAB -contributing factors of age, vaginal atrophy, depression, anxiety and Benzo's -UA negative -PVR 202 mL   HPI: [name] is a 87 y.o. female who presents today for kidney issues.   Patient is a very difficult historian.  She is certain she knows what is wrong with her body and she is certain on what she needs to treat her issues.  She is having issues with nocturia x4, she states that this started a few weeks ago.  She states she drinks 4 glasses of water a day, but she does not drink any fluids at bedtime.  Patient denies any modifying or aggravating factors.  Patient denies any gross hematuria, dysuria or suprapubic/flank pain.  Patient denies any fevers, chills, nausea or vomiting.    She also states that she is noted abdominal swelling and is currently has a CT scan of the abdomen and pelvis ordered through her gynecologist for further evaluation of this issue.  PMH: Past Medical History:  Diagnosis Date   Anxiety    Arthritis    "may have a touch; all over I guess" (10/11/2017)   Back pain    "all my back" (10/11/2017)   Basal cell carcinoma    "have had many taken off; burned" (10/11/2017)   Chronic orthostatic hypotension 06/2010   with pprior syncopal event,  did not tolelrate florinef trial   Complication of anesthesia    2019 ws very confused for several days after anesthesia   Constipation 08/12/2013   Depression    Hyperlipidemia    Hypertension    Tibia/fibula fracture 10/09/2017   Urinary frequency     Surgical History: Past Surgical History:  Procedure Laterality Date   ANKLE FRACTURE SURGERY Left 1998   CATARACT EXTRACTION W/ INTRAOCULAR LENS  IMPLANT, BILATERAL Bilateral    DILATION AND CURETTAGE OF UTERUS      FRACTURE SURGERY     IM NAILING TIBIA Left 10/10/2017   LAPAROSCOPIC BILATERAL SALPINGO OOPHERECTOMY Bilateral 09/02/2020   Procedure: LAPAROSCOPIC BILATERAL SALPINGO OOPHORECTOMY;  Surgeon: Schermerhorn, [name], MD;  Location: [facility];  Service: Gynecology;  Laterality: Bilateral;   TIBIA IM NAIL INSERTION Left 10/10/2017   Procedure: INTRAMEDULLARY (IM) NAIL TIBIAL;  Surgeon: Leandrew Koyanagi, MD;  Location: [facility];  Service: Orthopedics;  Laterality: Left;   WRIST FRACTURE SURGERY Right 2005    Home Medications:  Allergies as of 09/27/2020   No Known Allergies      Medication List        Accurate as of September 27, 2020 11:59 PM. If you have any questions, ask your nurse or doctor.          amLODipine 5 MG tablet Commonly known as: NORVASC TAKE ONE TABLET BY MOUTH ONCE DAILY   CALCIUM 600/VITAMIN D3 PO Take 1 tablet by mouth daily.   diazepam 5 MG tablet Commonly known as: VALIUM TAKE ONE TABLET BY MOUTH ONCE DAILY AS NEEDED FOR ANXIETY OR FOR MUSCLE SPASMS   diphenhydramine-acetaminophen 25-500 MG Tabs tablet Commonly known as: TYLENOL PM Take 1 tablet by mouth at bedtime.   FIBER PO Take 1 capsule by mouth in the morning, at noon, and at bedtime.   Garlic Oil 9381 MG Caps Take 1,000 mg by mouth daily.  Lutein 20 MG Caps Take 20 mg by mouth daily.   propranolol ER 60 MG 24 hr capsule Commonly known as: INDERAL LA TAKE ONE CAPSULE BY MOUTH ONCE DAILY What changed: when to take this   sennosides-docusate sodium 8.6-50 MG tablet Commonly known as: SENOKOT-S Take 1 tablet by mouth daily as needed for constipation.   SYSTANE BALANCE OP Place 1 drop into both eyes 4 (four) times daily as needed (dry eyes).   tamsulosin 0.4 MG Caps capsule Commonly known as: FLOMAX Take 1 capsule (0.4 mg total) by mouth daily. Started by: Zara Council, PA-C   vitamin C 1000 MG tablet Take 1,000 mg by mouth every other day.   vitamin E 180 MG (400 UNITS)  capsule Take 400 Units by mouth daily.        Allergies: No Known Allergies  Family History: Family History  Problem Relation Age of Onset   Colon cancer Mother    Heart disease Father    Kidney disease Father    Cancer Neg Hx    Diabetes Neg Hx     Social History:  reports that she has never smoked. She has never used smokeless tobacco. She reports that she does not drink alcohol and does not use drugs.  ROS: Pertinent ROS in HPI  Physical Exam: BP (!) 174/76   Pulse (!) 56   Ht 5\' 6"  (1.676 m)   Wt 128 lb (58.1 kg)   BMI 20.66 kg/m   Constitutional:  Well nourished. Alert and oriented, No acute distress. HEENT: [place] AT, mask in place.  Trachea midline Cardiovascular: No clubbing, cyanosis, or edema. Respiratory: Normal respiratory effort, no increased work of breathing. GU: No CVA tenderness.  No bladder fullness or masses.  Atrophic external genitalia, normal pubic hair distribution, no lesions.  Normal urethral meatus, no lesions, no prolapse, no discharge.   No urethral masses, tenderness and/or tenderness. No bladder fullness, tenderness or masses. Pale vagina mucosa, poor estrogen effect, no discharge, no lesions, good pelvic support, no cystocele and no rectocele noted.  Anus and perineum are without rashes or lesions.     Neurologic: Grossly intact, no focal deficits, moving all 4 extremities. Psychiatric: Normal mood and affect.    Laboratory Data: Lab Results  Component Value Date   WBC 9.2 08/29/2020   HGB 12.7 08/29/2020   HCT 36.9 08/29/2020   MCV 99.2 08/29/2020   PLT 304 08/29/2020    Lab Results  Component Value Date   CREATININE 0.86 08/29/2020   Urinalysis Component     Latest Ref Rng & Units 09/27/2020  Specific Gravity, UA     1.005 - 1.030 1.015  pH, UA     5.0 - 7.5 7.0  Color, UA     Yellow Yellow  Appearance Ur     Clear Clear  Leukocytes,UA     Negative Negative  Protein,UA     Negative/Trace Negative  Glucose, UA      Negative Negative  Ketones, UA     Negative Negative  RBC, UA     Negative Negative  Bilirubin, UA     Negative Negative  Urobilinogen, Ur     0.2 - 1.0 mg/dL 0.2  Nitrite, UA     Negative Negative  Microscopic Examination      See below:   Component     Latest Ref Rng & Units 09/27/2020  WBC, UA     0 - 5 /hpf 0-5  RBC     0 -  2 /hpf 0-2  Epithelial Cells (non renal)     0 - 10 /hpf 0-10  Bacteria, UA     None seen/Few None seen  I have reviewed the labs.   Pertinent Imaging: Results for SHAWNYA, MAYOR" (MRN [id number]) as of 09/27/2020 09:35  Ref. Range 09/27/2020 09:25  Scan Result Unknown 202    Assessment & Plan:    1. Nocturia -I tried to explain to the patient risk factors and causes of nocturia, but she was very resistant to this conversation as she just wanted an overactive bladder medication prescribed for her -I explained that with her PVR being 200 cc, I am concerned that if she were to be prescribed an OAB agent that she may end up in urinary retention requiring catheter placement in the ED -She became very demanding and persistent stating that's what she needed as she knew her body and she knows how her body responds to medication and that it worked in the past for her -I stated that things may have changed over the last year and a half and that OAB medication would not be safe for her to take at this time -UA is clear, but I will send it for culture to rule out indolent infection -At this time we will have a trial of tamsulosin 0.4 mg to see if this would aid in bladder emptying  Return in about 1 month (around 10/27/2020) for PVR.  These notes generated with voice recognition software. I apologize for typographical errors.  Zara Council, PA-C  [facility] [address]  [place],  [id number] [phone]   I spent 30 minutes on the day of the encounter to include pre-visit record review,  face-to-face time with the patient, and post-visit ordering of tests.

## 2020-09-26 NOTE — Telephone Encounter (Signed)
Pt has been scheduled for 10/11/2020. Pt is aware of appt date and time.

## 2020-09-27 ENCOUNTER — Ambulatory Visit: Payer: Medicare Other | Admitting: Urology

## 2020-09-27 ENCOUNTER — Encounter: Payer: Self-pay | Admitting: Urology

## 2020-09-27 ENCOUNTER — Other Ambulatory Visit: Payer: Self-pay

## 2020-09-27 VITALS — BP 174/76 | HR 56 | Ht 66.0 in | Wt 128.0 lb

## 2020-09-27 DIAGNOSIS — R351 Nocturia: Secondary | ICD-10-CM | POA: Diagnosis not present

## 2020-09-27 DIAGNOSIS — N289 Disorder of kidney and ureter, unspecified: Secondary | ICD-10-CM

## 2020-09-27 LAB — URINALYSIS, COMPLETE
Bilirubin, UA: NEGATIVE
Glucose, UA: NEGATIVE
Ketones, UA: NEGATIVE
Leukocytes,UA: NEGATIVE
Nitrite, UA: NEGATIVE
Protein,UA: NEGATIVE
RBC, UA: NEGATIVE
Specific Gravity, UA: 1.015 (ref 1.005–1.030)
Urobilinogen, Ur: 0.2 mg/dL (ref 0.2–1.0)
pH, UA: 7 (ref 5.0–7.5)

## 2020-09-27 LAB — MICROSCOPIC EXAMINATION: Bacteria, UA: NONE SEEN

## 2020-09-27 LAB — BLADDER SCAN AMB NON-IMAGING: Scan Result: 202

## 2020-09-27 MED ORDER — TAMSULOSIN HCL 0.4 MG PO CAPS: 0.4000 mg | ORAL_CAPSULE | Freq: Every day | ORAL | 3 refills | Status: DC

## 2020-10-02 LAB — CULTURE, URINE COMPREHENSIVE

## 2020-10-11 ENCOUNTER — Encounter: Payer: Self-pay | Admitting: Internal Medicine

## 2020-10-11 ENCOUNTER — Other Ambulatory Visit: Payer: Self-pay

## 2020-10-11 ENCOUNTER — Ambulatory Visit (INDEPENDENT_AMBULATORY_CARE_PROVIDER_SITE_OTHER): Payer: Medicare Other | Admitting: Internal Medicine

## 2020-10-11 VITALS — BP 142/82 | HR 57 | Temp 97.6°F | Ht 66.0 in | Wt 130.4 lb

## 2020-10-11 DIAGNOSIS — M419 Scoliosis, unspecified: Secondary | ICD-10-CM

## 2020-10-11 DIAGNOSIS — R1907 Generalized intra-abdominal and pelvic swelling, mass and lump: Secondary | ICD-10-CM | POA: Diagnosis not present

## 2020-10-11 DIAGNOSIS — M545 Low back pain, unspecified: Secondary | ICD-10-CM | POA: Diagnosis not present

## 2020-10-11 DIAGNOSIS — M4185 Other forms of scoliosis, thoracolumbar region: Secondary | ICD-10-CM | POA: Diagnosis not present

## 2020-10-11 DIAGNOSIS — M5137 Other intervertebral disc degeneration, lumbosacral region: Secondary | ICD-10-CM | POA: Diagnosis not present

## 2020-10-11 DIAGNOSIS — I10 Essential (primary) hypertension: Secondary | ICD-10-CM

## 2020-10-11 DIAGNOSIS — R1904 Left lower quadrant abdominal swelling, mass and lump: Secondary | ICD-10-CM | POA: Diagnosis not present

## 2020-10-11 DIAGNOSIS — G8929 Other chronic pain: Secondary | ICD-10-CM

## 2020-10-11 NOTE — Patient Instructions (Signed)
   I agree with getting one more CT of the abdomen and pelvis to make sure there is no mass in the "retroperitoneum" (the area behind the kidneys)  Home physical therapy has been ordered

## 2020-10-11 NOTE — Progress Notes (Signed)
Patient ID: [name], female    DOB: January 21, 1926  Age: 87 y.o. MRN: [id number]  CC: The primary encounter diagnosis was Chronic bilateral low back pain without sciatica. Diagnoses of Levoscoliosis of thoracolumbar spine, Kyphoscoliosis deformity of spine, Generalized abdominal swelling, Essential hypertension, Abdominal swelling, left lower quadrant, and Degeneration of lumbar or lumbosacral intervertebral disc were also pertinent to this visit.  HPI [name] presents for follow up on  August surgery ( bilateral SO) Chief Complaint  Patient presents with   Follow-up    This visit occurred during the SARS-CoV-2 public health emergency.  Safety protocols were in place, including screening questions prior to the visit, additional usage of staff PPE, and extensive cleaning of exam room while observing appropriate contact time as indicated for disinfecting solutions.   [name] is a 87 yr old female with chronic back pain secondary to multilevel degenerative disk disease,  history of L1 vertebral compression fracture,  and moderate lumbar levoscoliosis who  underwent an exploratory lap BSO  on August 29  to remove a 13 x 11 cm left adenxal mass that was seen on 2nd pelvic ultrasound  done in office by Dr Ouida Sills during workup for abdominal swelling.  (June 2020 transvaginal U/s did not detect a mass but images were obscured by bowels ).  Per operative no  no  mass was found despite repeated exploration during surgery .  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   Her weight has not changed in 5 years.  She is accompanied by her granddaughter , who is also  very concerned that her grandmother has a LLQ mass and has many questions today .   She has a CT pf the abdomen and pelvis ordered by Dr  Ouida Sills and scheduled for  Oct 12 to rule out  a retroperitoneal mass.     Outpatient Medications Prior to Visit  Medication Sig Dispense Refill   amLODipine (NORVASC) 5 MG tablet TAKE ONE TABLET BY MOUTH ONCE DAILY 90 tablet 1   Ascorbic Acid (VITAMIN C) 1000 MG tablet Take 1,000 mg by mouth every other day.     Calcium Carb-Cholecalciferol (CALCIUM 600/VITAMIN D3 PO) Take 1 tablet by mouth daily.     diazepam (VALIUM) 5 MG tablet TAKE ONE TABLET BY MOUTH ONCE DAILY AS NEEDED FOR ANXIETY OR FOR MUSCLE SPASMS 30 tablet 5   diphenhydramine-acetaminophen (TYLENOL PM) 25-500 MG TABS tablet Take 1 tablet by mouth at bedtime.     FIBER PO Take 1 capsule by mouth in the morning, at noon, and at bedtime.     Garlic Oil 6546 MG CAPS Take 1,000 mg by mouth daily.     Lutein 20 MG CAPS Take 20 mg by mouth daily.       propranolol ER (INDERAL LA) 60 MG 24 hr capsule TAKE ONE CAPSULE BY MOUTH ONCE DAILY (Patient taking differently: Take 60 mg by mouth at bedtime.) 90 capsule 1   Propylene Glycol (SYSTANE BALANCE OP) Place 1 drop into both eyes 4 (four) times daily as needed (dry eyes).     sennosides-docusate sodium (SENOKOT-S) 8.6-50 MG tablet Take 1 tablet by mouth daily as needed for constipation.     vitamin E 180 MG (400 UNITS) capsule Take 400  Units by mouth daily.      tamsulosin (FLOMAX) 0.4 MG CAPS capsule Take 1 capsule (0.4 mg total) by mouth daily. (Patient not taking: Reported on 10/11/2020) 90 capsule 3   No facility-administered medications prior to visit.    Review of Systems;  Patient denies headache, fevers, malaise, unintentional weight loss, skin rash, eye pain, sinus congestion and sinus pain, sore throat, dysphagia,  hemoptysis , cough, dyspnea, wheezing, chest pain, palpitations, orthopnea, edema, abdominal pain, nausea, melena, diarrhea, constipation, flank pain, dysuria, hematuria, urinary  Frequency, nocturia, numbness, tingling, seizures,  Focal weakness, Loss of  consciousness,  Tremor, insomnia, depression, anxiety, and suicidal ideation.      Objective:  BP (!) 142/82 (BP Location: Left Arm, Patient Position: Sitting, Cuff Size: Normal)   Pulse (!) 57   Temp 97.6 F (36.4 C) (Oral)   Ht 5\' 6"  (1.676 m)   Wt 130 lb 6.4 oz (59.1 kg)   SpO2 96%   BMI 21.05 kg/m   BP Readings from Last 3 Encounters:  10/11/20 (!) 142/82  09/27/20 (!) 174/76  09/02/20 (!) 147/73    Wt Readings from Last 3 Encounters:  10/11/20 130 lb 6.4 oz (59.1 kg)  09/27/20 128 lb (58.1 kg)  08/23/20 128 lb (58.1 kg)    General appearance: alert, cooperative and appears stated age Ears: normal TM's and external ear canals both ears Throat: lips, mucosa, and tongue normal; teeth and gums normal Neck: no adenopathy, no carotid bruit, supple, symmetrical, trachea midline and thyroid not enlarged, symmetric, no tenderness/mass/nodules Back: moderate levoscoliosis . ROM normal. No CVA tenderness. Lungs: clear to auscultation bilaterally Heart: regular rate and rhythm, S1, S2 normal, no murmur, click, rub or gallop Abdomen: soft, non-tender; bowel sounds normal; no masses,  no organomegaly. Asymmetric distension during upright position  Pulses: 2+ and symmetric Skin: Skin color, texture, turgor normal. No rashes or lesions Lymph nodes: Cervical, supraclavicular, and axillary nodes normal.  No results found for: HGBA1C  Lab Results  Component Value Date   CREATININE 0.86 08/29/2020   CREATININE 0.83 07/26/2020   CREATININE 0.82 04/17/2020    Lab Results  Component Value Date   WBC 9.2 08/29/2020   HGB 12.7 08/29/2020   HCT 36.9 08/29/2020   PLT 304 08/29/2020   GLUCOSE 100 (H) 08/29/2020   CHOL 254 (H) 06/09/2016   TRIG 103.0 06/09/2016   HDL 60.00 06/09/2016   LDLDIRECT 145.6 12/21/2012   LDLCALC 174 (H) 06/09/2016   ALT 9 06/02/2019   AST 17 06/02/2019   NA 134 (L) 08/29/2020   K 4.6 08/29/2020   CL 100 08/29/2020   CREATININE 0.86 08/29/2020   BUN  16 08/29/2020   CO2 29 08/29/2020   TSH 1.38 09/14/2019   INR 0.99 11/30/2016    No results found.  Assessment & Plan:   Problem List Items Addressed This Visit       Unprioritized   Abdominal swelling, left lower quadrant    I am unable to provide adequate answers to patient and daughter as to the etiology of her physical abnormality, because her CT in 2020 was done for the same reasons, and negative,  As well as her  pelvic ultrasound  In 2021.  Based on her physical exam, there is no palpable mass .  I believe that her abdominal asymmetry has been caused by her levoscoliosis.  However I agree that repeating a CT is appropriate to rule out an interim development of a retroperitoneal  Mass.  Degeneration of lumbar or lumbosacral intervertebral disc    Resulting in chronic pain,  Loss of core muscle strength and loss of balance.  Home Health PT ordered.       Essential hypertension    Adequate control for age  on current regimen of amlodipine and propranolol.  Renal function stable, no changes today.  Lab Results  Component Value Date   CREATININE 0.86 08/29/2020   Lab Results  Component Value Date   NA 134 (L) 08/29/2020   K 4.6 08/29/2020   CL 100 08/29/2020   CO2 29 08/29/2020         Other Visit Diagnoses     Chronic bilateral low back pain without sciatica    -  Primary   Relevant Orders   Ambulatory referral to [place] of thoracolumbar spine       Relevant Orders   Ambulatory referral to [place] deformity of spine       Relevant Orders   Ambulatory referral to [employer] abdominal swelling       Relevant Orders   Ambulatory referral to [place]       I spent 40 minutes dedicated to the care of this patient on the date of this encounter to include pre-visit review of her medical history,  recent operative note,  previous imaging studies,  Face-to-face time with the patient , and post visit  ordering of testing and therapeutics.   There are no discontinued medications.  Follow-up: No follow-ups on file.   Crecencio Mc, MD

## 2020-10-13 NOTE — Assessment & Plan Note (Addendum)
Adequate control for age  on current regimen of amlodipine and propranolol.  Renal function stable, no changes today.  Lab Results  Component Value Date   CREATININE 0.86 08/29/2020   Lab Results  Component Value Date   NA 134 (L) 08/29/2020   K 4.6 08/29/2020   CL 100 08/29/2020   CO2 29 08/29/2020

## 2020-10-13 NOTE — Assessment & Plan Note (Addendum)
I am unable to provide adequate answers to patient and daughter as to the etiology of her physical abnormality, because her CT in 2020 was done for the same reasons, and negative,  As well as her  pelvic ultrasound  In 2021.  Based on her physical exam, there is no palpable mass .  I believe that her abdominal asymmetry has been caused by her levoscoliosis.  However I agree that repeating a CT is appropriate to rule out an interim development of a retroperitoneal  Mass.

## 2020-10-13 NOTE — Assessment & Plan Note (Signed)
Resulting in chronic pain,  Loss of core muscle strength and loss of balance.  Home Health PT ordered.

## 2020-10-16 ENCOUNTER — Ambulatory Visit
Admission: RE | Admit: 2020-10-16 | Discharge: 2020-10-16 | Disposition: A | Discharge status: Home / Self Care | Payer: Medicare Other | Source: Ambulatory Visit | Attending: Obstetrics and Gynecology | Admitting: Obstetrics and Gynecology

## 2020-10-16 ENCOUNTER — Other Ambulatory Visit: Payer: Self-pay

## 2020-10-16 DIAGNOSIS — K6389 Other specified diseases of intestine: Secondary | ICD-10-CM | POA: Diagnosis not present

## 2020-10-16 DIAGNOSIS — K5730 Diverticulosis of large intestine without perforation or abscess without bleeding: Secondary | ICD-10-CM | POA: Diagnosis not present

## 2020-10-16 DIAGNOSIS — R140 Abdominal distension (gaseous): Secondary | ICD-10-CM | POA: Insufficient documentation

## 2020-10-16 DIAGNOSIS — R109 Unspecified abdominal pain: Secondary | ICD-10-CM | POA: Diagnosis not present

## 2020-10-16 DIAGNOSIS — R1900 Intra-abdominal and pelvic swelling, mass and lump, unspecified site: Secondary | ICD-10-CM | POA: Insufficient documentation

## 2020-10-16 DIAGNOSIS — M419 Scoliosis, unspecified: Secondary | ICD-10-CM | POA: Diagnosis not present

## 2020-10-16 LAB — POCT I-STAT CREATININE: Creatinine, Ser: 0.7 mg/dL (ref 0.44–1.00)

## 2020-10-16 MED ORDER — IOHEXOL 300 MG/ML  SOLN
85.0000 mL | Freq: Once | INTRAMUSCULAR | Status: AC | PRN
  Administered 2020-10-16: 85 mL via INTRAVENOUS

## 2020-10-23 DIAGNOSIS — G8929 Other chronic pain: Secondary | ICD-10-CM | POA: Diagnosis not present

## 2020-10-23 DIAGNOSIS — M4185 Other forms of scoliosis, thoracolumbar region: Secondary | ICD-10-CM | POA: Diagnosis not present

## 2020-10-23 DIAGNOSIS — M5137 Other intervertebral disc degeneration, lumbosacral region: Secondary | ICD-10-CM | POA: Diagnosis not present

## 2020-10-23 DIAGNOSIS — R1907 Generalized intra-abdominal and pelvic swelling, mass and lump: Secondary | ICD-10-CM | POA: Diagnosis not present

## 2020-10-23 DIAGNOSIS — Z9181 History of falling: Secondary | ICD-10-CM | POA: Diagnosis not present

## 2020-10-23 DIAGNOSIS — I10 Essential (primary) hypertension: Secondary | ICD-10-CM | POA: Diagnosis not present

## 2020-10-29 ENCOUNTER — Ambulatory Visit: Payer: Medicare Other | Admitting: Urology

## 2020-10-30 DIAGNOSIS — M5137 Other intervertebral disc degeneration, lumbosacral region: Secondary | ICD-10-CM | POA: Diagnosis not present

## 2020-10-30 DIAGNOSIS — Z9181 History of falling: Secondary | ICD-10-CM | POA: Diagnosis not present

## 2020-10-30 DIAGNOSIS — R1907 Generalized intra-abdominal and pelvic swelling, mass and lump: Secondary | ICD-10-CM | POA: Diagnosis not present

## 2020-10-30 DIAGNOSIS — G8929 Other chronic pain: Secondary | ICD-10-CM | POA: Diagnosis not present

## 2020-10-30 DIAGNOSIS — M4185 Other forms of scoliosis, thoracolumbar region: Secondary | ICD-10-CM | POA: Diagnosis not present

## 2020-10-30 DIAGNOSIS — I10 Essential (primary) hypertension: Secondary | ICD-10-CM | POA: Diagnosis not present

## 2020-11-01 ENCOUNTER — Ambulatory Visit (INDEPENDENT_AMBULATORY_CARE_PROVIDER_SITE_OTHER): Payer: Medicare Other | Admitting: Internal Medicine

## 2020-11-01 ENCOUNTER — Encounter: Payer: Self-pay | Admitting: Internal Medicine

## 2020-11-01 ENCOUNTER — Other Ambulatory Visit: Payer: Self-pay

## 2020-11-01 DIAGNOSIS — R1904 Left lower quadrant abdominal swelling, mass and lump: Secondary | ICD-10-CM

## 2020-11-01 DIAGNOSIS — I10 Essential (primary) hypertension: Secondary | ICD-10-CM | POA: Diagnosis not present

## 2020-11-01 DIAGNOSIS — K5900 Constipation, unspecified: Secondary | ICD-10-CM

## 2020-11-01 NOTE — Assessment & Plan Note (Signed)
Ct abd and pelvis ruled out r/p mass but noted thinning of the abd wall In this area with Pre-hernia physiology .  Recommending use of girdle and avoidance of activities that put a strain on the abd wall.

## 2020-11-01 NOTE — Assessment & Plan Note (Signed)
She has been using sennakot daily for the last 2 weeks.  Recommend increased water intake, use of miralax and magnesium if needed to avoid avoid daily use of sennakot.

## 2020-11-01 NOTE — Assessment & Plan Note (Signed)
Home readings are well below 130/80.  No changes today

## 2020-11-01 NOTE — Progress Notes (Signed)
Subjective:  Patient ID: [name], female    DOB: [birth year]  Age: 87 y.o. MRN: [id number]  CC: Diagnoses of Abdominal swelling, left lower quadrant, Constipation, unspecified constipation type, and Essential hypertension were pertinent to this visit.  HPI [name] presents for follow up on CT scan ordered by Dr Ouida Sills to investigate persistent complaints of left lower quadrant swelling  Chief Complaint  Patient presents with   Follow-up    Follow up from CT scan    CT was done on Oct 12, to rule out r/p mass.  CT reviewed with patient in detail with images pulled up and explained.  She was reassured (repeatedly) that she has  no masses,  LAD seen .  Moderate stool ,  extensive diverticulosis without itis,    distended bladder (measuring about 400 ml) and asymmetric  laxity of the LLQ ventral abdominal wall was seen without hernia defect but with mild protuberance of nonobstructed loops of bowel   The area noted on the CT corresponds to her asymmetric deformity of her  abdomen  in the  the LLQ .   She used to wear a girdle but stopped wearing it because it aggravated her back  several years ago.  \   Constipation : moving bowels daily  using Sennakot 1 -2 per night..  drinking 32 ounces of water daily (forced) cannot drink any more, except  coffee for breakfast   Bladder distension:  she has no symptoms of retention:  she voids 4 times daily  most days some times more.   Denies hesitancy, has occasional urgency   HTN: Patient is taking her medications as prescribed and notes no adverse effects.  Home BP readings have been done about once per week and are  generally < 120/80 .  She is avoiding added salt in her diet and walking regularly about 3 times per week for exercise  .    Outpatient Medications Prior to Visit  Medication Sig Dispense Refill   amLODipine (NORVASC) 5 MG tablet TAKE ONE TABLET BY MOUTH ONCE DAILY 90 tablet 1   Ascorbic Acid (VITAMIN C) 1000 MG  tablet Take 1,000 mg by mouth every other day.     Calcium Carb-Cholecalciferol (CALCIUM 600/VITAMIN D3 PO) Take 1 tablet by mouth daily.     diazepam (VALIUM) 5 MG tablet TAKE ONE TABLET BY MOUTH ONCE DAILY AS NEEDED FOR ANXIETY OR FOR MUSCLE SPASMS 30 tablet 5   diphenhydramine-acetaminophen (TYLENOL PM) 25-500 MG TABS tablet Take 1 tablet by mouth at bedtime.     FIBER PO Take 1 capsule by mouth in the morning, at noon, and at bedtime.     Garlic Oil 5726 MG CAPS Take 1,000 mg by mouth daily.     Lutein 20 MG CAPS Take 20 mg by mouth daily.       propranolol ER (INDERAL LA) 60 MG 24 hr capsule TAKE ONE CAPSULE BY MOUTH ONCE DAILY (Patient taking differently: Take 60 mg by mouth at bedtime.) 90 capsule 1   Propylene Glycol (SYSTANE BALANCE OP) Place 1 drop into both eyes 4 (four) times daily as needed (dry eyes).     sennosides-docusate sodium (SENOKOT-S) 8.6-50 MG tablet Take 1 tablet by mouth daily as needed for constipation.     vitamin E 180 MG (400 UNITS) capsule Take 400 Units by mouth daily.      tamsulosin (FLOMAX) 0.4 MG CAPS capsule Take 1 capsule (0.4 mg total) by mouth daily. (Patient not  taking: Reported on 11/01/2020) 90 capsule 3   No facility-administered medications prior to visit.    Review of Systems;  Patient denies headache, fevers, malaise, unintentional weight loss, skin rash, eye pain, sinus congestion and sinus pain, sore throat, dysphagia,  hemoptysis , cough, dyspnea, wheezing, chest pain, palpitations, orthopnea, edema, abdominal pain, nausea, melena, diarrhea, constipation, flank pain, dysuria, hematuria, urinary  Frequency, nocturia, numbness, tingling, seizures,  Focal weakness, Loss of consciousness,  Tremor, insomnia, depression, anxiety, and suicidal ideation.      Objective:  BP (!) 154/80 (BP Location: Left Arm, Patient Position: Sitting, Cuff Size: Normal)   Pulse 75   Temp (!) 97.4 F (36.3 C) (Temporal)   Ht 5\' 6"  (1.676 m)   Wt 129 lb (58.5 kg)    SpO2 98%   BMI 20.82 kg/m   BP Readings from Last 3 Encounters:  11/01/20 (!) 154/80  10/11/20 (!) 142/82  09/27/20 (!) 174/76    Wt Readings from Last 3 Encounters:  11/01/20 129 lb (58.5 kg)  10/11/20 130 lb 6.4 oz (59.1 kg)  09/27/20 128 lb (58.1 kg)    General appearance: alert, cooperative and appears stated age Ears: normal TM's and external ear canals both ears Throat: lips, mucosa, and tongue normal; teeth and gums normal Neck: no adenopathy, no carotid bruit, supple, symmetrical, trachea midline and thyroid not enlarged, symmetric, no tenderness/mass/nodules Back: symmetric, no curvature. ROM normal. No CVA tenderness. Lungs: clear to auscultation bilaterally Heart: regular rate and rhythm, S1, S2 normal, no murmur, click, rub or gallop Abdomen: soft, non-tender; bowel sounds normal; asymmetric distension of LLQ and suprapubic area,  non tender , no organomegaly Pulses: 2+ and symmetric Skin: Skin color, texture, turgor normal. No rashes or lesions Lymph nodes: Cervical, supraclavicular, and axillary nodes normal.  No results found for: HGBA1C  Lab Results  Component Value Date   CREATININE 0.70 10/16/2020   CREATININE 0.86 08/29/2020   CREATININE 0.83 07/26/2020    Lab Results  Component Value Date   WBC 9.2 08/29/2020   HGB 12.7 08/29/2020   HCT 36.9 08/29/2020   PLT 304 08/29/2020   GLUCOSE 100 (H) 08/29/2020   CHOL 254 (H) 06/09/2016   TRIG 103.0 06/09/2016   HDL 60.00 06/09/2016   LDLDIRECT 145.6 12/21/2012   LDLCALC 174 (H) 06/09/2016   ALT 9 06/02/2019   AST 17 06/02/2019   NA 134 (L) 08/29/2020   K 4.6 08/29/2020   CL 100 08/29/2020   CREATININE 0.70 10/16/2020   BUN 16 08/29/2020   CO2 29 08/29/2020   TSH 1.38 09/14/2019   INR 0.99 11/30/2016    CT ABDOMEN PELVIS W CONTRAST  Result Date: 10/18/2020 CLINICAL DATA:  Patient complains of left-sided abdominal bloating and discomfort since 06/2020. Patient denies history of COVID-19 and  skin cancer. History of laparoscopic bilateral salpingo-oophorectomy on 09/02/2020. EXAM: CT ABDOMEN AND PELVIS WITH CONTRAST TECHNIQUE: Multidetector CT imaging of the abdomen and pelvis was performed using the standard protocol following bolus administration of intravenous contrast. CONTRAST:  62mL OMNIPAQUE IOHEXOL 300 MG/ML  SOLN COMPARISON:  U/S pelvis 06/13/2019; CT abdomen and pelvis 06/07/2018; U/S renal 10/29/2017. FINDINGS: Lower chest: Chronic increased reticular interstitial markings are noted within both lung bases. Subpleural scarring identified within the posterior right base. Hepatobiliary: No focal liver abnormality is seen. No gallstones, gallbladder wall thickening, or biliary dilatation. Pancreas: Unremarkable. No pancreatic ductal dilatation or surrounding inflammatory changes. Spleen: Normal in size without focal abnormality. Adrenals/Urinary Tract: Adrenal glands are normal. No kidney  mass, hydronephrosis or nephrolithiasis. Bladder appears distended measuring 10.9 x 6.0 x 11.0 cm (volume = 380 cm^3). Stomach/Bowel: Stomach is normal. The appendix is visualized and is within normal limits. No small bowel wall thickening, inflammation, or distension. There is a moderate stool burden identified throughout the colon. Extensive sigmoid diverticulosis identified. No convincing evidence for acute diverticulitis at this time. Vascular/Lymphatic: Aortic atherosclerosis. No aneurysm. No abdominopelvic adenopathy. Reproductive: Uterus containing several calcifications appears normal for age. No adnexal mass. Other: No free fluid or fluid collections. Asymmetric laxity of the left lower quadrant ventral abdominal wall is identified without hernia defect but with mild protuberance of nonobstructed loops of small and large bowel, image 53/2. Musculoskeletal: Scoliosis and degenerative disc disease identified. No acute chronic wedge deformity involving the L1 vertebral body is unchanged from previous exam.  No acute or suspicious osseous findings. IMPRESSION: 1. No acute findings identified within the abdomen or pelvis. 2. Extensive sigmoid diverticulosis without convincing evidence for acute diverticulitis at this time. 3. Moderate stool burden identified throughout the colon. Correlate for any clinical signs or symptoms of constipation. 4. Asymmetric laxity of the left lower quadrant ventral abdominal wall without hernia defect but with mild protuberances of nonobstructed loops of small and large bowel. 5. Aortic Atherosclerosis (ICD10-I70.0). Electronically Signed   By: Kerby Moors M.D.   On: 10/18/2020 13:20    Assessment & Plan:   Problem List Items Addressed This Visit     Essential hypertension    Home readings are well below 130/80.  No changes today       Constipation    She has been using sennakot daily for the last 2 weeks.  Recommend increased water intake, use of miralax and magnesium if needed to avoid avoid daily use of sennakot.        Abdominal swelling, left lower quadrant    Ct abd and pelvis ruled out r/p mass but noted thinning of the abd wall In this area with Pre-hernia physiology .  Recommending use of girdle and avoidance of activities that put a strain on the abd wall.          I have discontinued [name] "[name]"'s tamsulosin. I am also having her maintain her Lutein, vitamin E, Garlic Oil, sennosides-docusate sodium, vitamin C, FIBER PO, Propylene Glycol (SYSTANE BALANCE OP), propranolol ER, amLODipine, diphenhydramine-acetaminophen, Calcium Carb-Cholecalciferol (CALCIUM 600/VITAMIN D3 PO), and diazepam.  No orders of the defined types were placed in this encounter.   Medications Discontinued During This Encounter  Medication Reason   tamsulosin (FLOMAX) 0.4 MG CAPS capsule     Follow-up: No follow-ups on file.   Crecencio Mc, MD

## 2020-11-01 NOTE — Patient Instructions (Signed)
You do not have a mass.  You have a weakened abdominal wall and this is allowing the intestines to expand instead of keeping them tightly compacted as they were when you were younger   This can turn into a hernia over time.  You must avoid any activities that cause you to grunt or strain because this will place more pressure from the inside OUT on the  intestines  a nd create a hernia  Please go to [facility] and get fitted for a girdle or with SPANX  I will talk to one of the general surgeons about seeing you    For your constipation;  I recommend using miralax in a full glass of water  EVERY NIGHT.  You can also take 250 g of magnesium citrate nightly . Along with the miralax.  This combination may help you avoid using Sennakot every night.

## 2020-11-07 ENCOUNTER — Telehealth: Payer: Self-pay | Admitting: Internal Medicine

## 2020-11-07 NOTE — Telephone Encounter (Signed)
Patient would like a DNR mailed to her. She keeps forgetting to ask for one.

## 2020-11-07 NOTE — Telephone Encounter (Signed)
Is this okay to fill out?

## 2020-11-08 NOTE — Telephone Encounter (Signed)
Spoke with pt and scheduled her for November 14th at 11:30. Pt is aware of appt date and time.

## 2020-11-09 DIAGNOSIS — I10 Essential (primary) hypertension: Secondary | ICD-10-CM | POA: Diagnosis not present

## 2020-11-09 DIAGNOSIS — M5137 Other intervertebral disc degeneration, lumbosacral region: Secondary | ICD-10-CM | POA: Diagnosis not present

## 2020-11-09 DIAGNOSIS — G8929 Other chronic pain: Secondary | ICD-10-CM | POA: Diagnosis not present

## 2020-11-09 DIAGNOSIS — Z9181 History of falling: Secondary | ICD-10-CM | POA: Diagnosis not present

## 2020-11-09 DIAGNOSIS — M4185 Other forms of scoliosis, thoracolumbar region: Secondary | ICD-10-CM | POA: Diagnosis not present

## 2020-11-09 DIAGNOSIS — R1907 Generalized intra-abdominal and pelvic swelling, mass and lump: Secondary | ICD-10-CM | POA: Diagnosis not present

## 2020-11-14 DIAGNOSIS — Z9181 History of falling: Secondary | ICD-10-CM | POA: Diagnosis not present

## 2020-11-14 DIAGNOSIS — M5137 Other intervertebral disc degeneration, lumbosacral region: Secondary | ICD-10-CM | POA: Diagnosis not present

## 2020-11-14 DIAGNOSIS — R1907 Generalized intra-abdominal and pelvic swelling, mass and lump: Secondary | ICD-10-CM | POA: Diagnosis not present

## 2020-11-14 DIAGNOSIS — I10 Essential (primary) hypertension: Secondary | ICD-10-CM | POA: Diagnosis not present

## 2020-11-14 DIAGNOSIS — G8929 Other chronic pain: Secondary | ICD-10-CM | POA: Diagnosis not present

## 2020-11-14 DIAGNOSIS — M4185 Other forms of scoliosis, thoracolumbar region: Secondary | ICD-10-CM | POA: Diagnosis not present

## 2020-11-18 ENCOUNTER — Ambulatory Visit: Payer: Medicare Other | Admitting: Internal Medicine

## 2020-11-18 DIAGNOSIS — G8929 Other chronic pain: Secondary | ICD-10-CM | POA: Diagnosis not present

## 2020-11-18 DIAGNOSIS — M5137 Other intervertebral disc degeneration, lumbosacral region: Secondary | ICD-10-CM | POA: Diagnosis not present

## 2020-11-18 DIAGNOSIS — Z9181 History of falling: Secondary | ICD-10-CM | POA: Diagnosis not present

## 2020-11-18 DIAGNOSIS — R1907 Generalized intra-abdominal and pelvic swelling, mass and lump: Secondary | ICD-10-CM | POA: Diagnosis not present

## 2020-11-18 DIAGNOSIS — M4185 Other forms of scoliosis, thoracolumbar region: Secondary | ICD-10-CM | POA: Diagnosis not present

## 2020-11-18 DIAGNOSIS — I10 Essential (primary) hypertension: Secondary | ICD-10-CM | POA: Diagnosis not present

## 2020-11-19 DIAGNOSIS — G8929 Other chronic pain: Secondary | ICD-10-CM | POA: Diagnosis not present

## 2020-11-19 DIAGNOSIS — Z9181 History of falling: Secondary | ICD-10-CM | POA: Diagnosis not present

## 2020-11-19 DIAGNOSIS — I10 Essential (primary) hypertension: Secondary | ICD-10-CM | POA: Diagnosis not present

## 2020-11-19 DIAGNOSIS — M4185 Other forms of scoliosis, thoracolumbar region: Secondary | ICD-10-CM | POA: Diagnosis not present

## 2020-11-19 DIAGNOSIS — R1907 Generalized intra-abdominal and pelvic swelling, mass and lump: Secondary | ICD-10-CM | POA: Diagnosis not present

## 2020-11-19 DIAGNOSIS — M5137 Other intervertebral disc degeneration, lumbosacral region: Secondary | ICD-10-CM | POA: Diagnosis not present

## 2020-11-22 DIAGNOSIS — I10 Essential (primary) hypertension: Secondary | ICD-10-CM | POA: Diagnosis not present

## 2020-11-22 DIAGNOSIS — M4185 Other forms of scoliosis, thoracolumbar region: Secondary | ICD-10-CM | POA: Diagnosis not present

## 2020-11-22 DIAGNOSIS — G8929 Other chronic pain: Secondary | ICD-10-CM | POA: Diagnosis not present

## 2020-11-22 DIAGNOSIS — M5137 Other intervertebral disc degeneration, lumbosacral region: Secondary | ICD-10-CM | POA: Diagnosis not present

## 2020-11-22 DIAGNOSIS — R1907 Generalized intra-abdominal and pelvic swelling, mass and lump: Secondary | ICD-10-CM | POA: Diagnosis not present

## 2020-11-22 DIAGNOSIS — Z9181 History of falling: Secondary | ICD-10-CM | POA: Diagnosis not present

## 2020-11-26 DIAGNOSIS — Z9181 History of falling: Secondary | ICD-10-CM | POA: Diagnosis not present

## 2020-11-26 DIAGNOSIS — I10 Essential (primary) hypertension: Secondary | ICD-10-CM | POA: Diagnosis not present

## 2020-11-26 DIAGNOSIS — M4185 Other forms of scoliosis, thoracolumbar region: Secondary | ICD-10-CM | POA: Diagnosis not present

## 2020-11-26 DIAGNOSIS — R1907 Generalized intra-abdominal and pelvic swelling, mass and lump: Secondary | ICD-10-CM | POA: Diagnosis not present

## 2020-11-26 DIAGNOSIS — G8929 Other chronic pain: Secondary | ICD-10-CM | POA: Diagnosis not present

## 2020-11-26 DIAGNOSIS — M5137 Other intervertebral disc degeneration, lumbosacral region: Secondary | ICD-10-CM | POA: Diagnosis not present

## 2020-12-02 ENCOUNTER — Telehealth: Payer: Self-pay | Admitting: Internal Medicine

## 2020-12-02 DIAGNOSIS — R1904 Left lower quadrant abdominal swelling, mass and lump: Secondary | ICD-10-CM

## 2020-12-02 NOTE — Telephone Encounter (Signed)
The patient is requesting a referral to [facility] gastroenterology with Dr. Madolyn Frieze for Stomach swelling . Patient stated she has been seen for this issue.

## 2020-12-02 NOTE — Telephone Encounter (Signed)
Referral has been pended.

## 2020-12-03 DIAGNOSIS — H353222 Exudative age-related macular degeneration, left eye, with inactive choroidal neovascularization: Secondary | ICD-10-CM | POA: Diagnosis not present

## 2020-12-03 DIAGNOSIS — H3411 Central retinal artery occlusion, right eye: Secondary | ICD-10-CM | POA: Diagnosis not present

## 2020-12-03 NOTE — Telephone Encounter (Signed)
Pt has been scheduled for 12/11/2020 at 11:30 am.

## 2020-12-05 DIAGNOSIS — Z9181 History of falling: Secondary | ICD-10-CM | POA: Diagnosis not present

## 2020-12-05 DIAGNOSIS — G8929 Other chronic pain: Secondary | ICD-10-CM | POA: Diagnosis not present

## 2020-12-05 DIAGNOSIS — R1907 Generalized intra-abdominal and pelvic swelling, mass and lump: Secondary | ICD-10-CM | POA: Diagnosis not present

## 2020-12-05 DIAGNOSIS — M4185 Other forms of scoliosis, thoracolumbar region: Secondary | ICD-10-CM | POA: Diagnosis not present

## 2020-12-05 DIAGNOSIS — M5137 Other intervertebral disc degeneration, lumbosacral region: Secondary | ICD-10-CM | POA: Diagnosis not present

## 2020-12-05 DIAGNOSIS — I10 Essential (primary) hypertension: Secondary | ICD-10-CM | POA: Diagnosis not present

## 2020-12-11 ENCOUNTER — Ambulatory Visit (INDEPENDENT_AMBULATORY_CARE_PROVIDER_SITE_OTHER): Payer: Medicare Other | Admitting: Internal Medicine

## 2020-12-11 ENCOUNTER — Other Ambulatory Visit: Payer: Self-pay

## 2020-12-11 ENCOUNTER — Encounter: Payer: Self-pay | Admitting: Internal Medicine

## 2020-12-11 VITALS — BP 132/66 | HR 67 | Temp 96.8°F | Ht 66.0 in | Wt 131.8 lb

## 2020-12-11 DIAGNOSIS — K5900 Constipation, unspecified: Secondary | ICD-10-CM | POA: Diagnosis not present

## 2020-12-11 DIAGNOSIS — R1904 Left lower quadrant abdominal swelling, mass and lump: Secondary | ICD-10-CM | POA: Diagnosis not present

## 2020-12-11 NOTE — Patient Instructions (Signed)
   I have made the referral to Dr Madolyn Frieze as you have requested   I recommend continued use of compressive/supportive undergarments

## 2020-12-11 NOTE — Assessment & Plan Note (Addendum)
I again reviewed the findings of the most recent CT abd and pelvis which ruled out r/p mass but noted thinning of the abd wall In this area with Pre-hernia physiology . The cause is unclear: She has no history of abdominal incision or penetrating trauma in this area but has a non surgical appearing stellate shaped scar overlying the area of thinning.  I recommend that she continue to use compressive undergarments and practice avoidance of activities that put a strain on the abd wall.   She is adamant abut requesting an evaluation by Dr Alice Reichert

## 2020-12-11 NOTE — Progress Notes (Signed)
Subjective:  Patient ID: [name], female    DOB: 1926-08-15  Age: 87 y.o. MRN: [id number]  CC: The primary encounter diagnosis was Constipation, unspecified constipation type. A diagnosis of Abdominal swelling, left lower quadrant was also pertinent to this visit.  HPI AIRLIE White presents for evaluation due to request to see GI Chief Complaint  Patient presents with   Follow-up   This visit occurred during the SARS-CoV-2 public health emergency.  Safety protocols were in place, including screening questions prior to the visit, additional usage of staff PPE, and extensive cleaning of exam room while observing appropriate contact time as indicated for disinfecting solutions.    87 yr old female with no history of abdominal surgery or penetrating wounds prior to July 2022 elective lap BSO (to remove a complex cyst that was noted on recent transvaginal ultrasound  but not found during  surgery ) which failed to find a mass suggested by prior imaging study presents with request for  third opinion  on the etiology and cause of her asymmetric lower abdominal swelling.   Last seen Oct 28 to review CT of abd and pelvis which was ordered by GYN after a nondiagnostic laparotomy done to rule out a retroperitoneal mass . No mass was seen .  Thinning of the anterior abdominal wall musculature was noted without herniation and constipation/diverticulosis was also noted  .  she was advised to wear a girdle and to  avoid activities that strain the abd wall .  She is wearing Spanx type garments but "they don't hold my stomach in" and is frustrated that something can't be done .  She denies pain.  She has chronic constipation but moves bowels daily or every other day    Outpatient Medications Prior to Visit  Medication Sig Dispense Refill   amLODipine (NORVASC) 5 MG tablet TAKE ONE TABLET BY MOUTH ONCE DAILY 90 tablet 1   Ascorbic Acid (VITAMIN C) 1000 MG tablet Take 1,000 mg by mouth every other  day.     Calcium Carb-Cholecalciferol (CALCIUM 600/VITAMIN D3 PO) Take 1 tablet by mouth daily.     diazepam (VALIUM) 5 MG tablet TAKE ONE TABLET BY MOUTH ONCE DAILY AS NEEDED FOR ANXIETY OR FOR MUSCLE SPASMS 30 tablet 5   diphenhydramine-acetaminophen (TYLENOL PM) 25-500 MG TABS tablet Take 1 tablet by mouth at bedtime.     FIBER PO Take 1 capsule by mouth in the morning, at noon, and at bedtime.     Garlic Oil 2751 MG CAPS Take 1,000 mg by mouth daily.     Lutein 20 MG CAPS Take 20 mg by mouth daily.       propranolol ER (INDERAL LA) 60 MG 24 hr capsule TAKE ONE CAPSULE BY MOUTH ONCE DAILY (Patient taking differently: Take 60 mg by mouth at bedtime.) 90 capsule 1   Propylene Glycol (SYSTANE BALANCE OP) Place 1 drop into both eyes 4 (four) times daily as needed (dry eyes).     sennosides-docusate sodium (SENOKOT-S) 8.6-50 MG tablet Take 1 tablet by mouth daily as needed for constipation.     vitamin E 180 MG (400 UNITS) capsule Take 400 Units by mouth daily.      No facility-administered medications prior to visit.    Review of Systems;  Patient denies headache, fevers, malaise, unintentional weight loss, skin rash, eye pain, sinus congestion and sinus pain, sore throat, dysphagia,  hemoptysis , cough, dyspnea, wheezing, chest pain, palpitations, orthopnea, edema, abdominal pain, nausea, melena,  diarrhea, constipation, flank pain, dysuria, hematuria, urinary  Frequency, nocturia, numbness, tingling, seizures,  Focal weakness, Loss of consciousness,  Tremor, insomnia, depression, anxiety, and suicidal ideation.      Objective:  BP 132/66   Pulse 67   Temp (!) 96.8 F (36 C) (Skin)   Ht 5\' 6"  (1.676 m)   Wt 131 lb 12.8 oz (59.8 kg)   SpO2 99%   BMI 21.27 kg/m   BP Readings from Last 3 Encounters:  12/11/20 132/66  11/01/20 (!) 154/80  10/11/20 (!) 142/82    Wt Readings from Last 3 Encounters:  12/11/20 131 lb 12.8 oz (59.8 kg)  11/01/20 129 lb (58.5 kg)  10/11/20 130 lb 6.4 oz  (59.1 kg)    General appearance: alert, cooperative and appears stated age Ears: normal TM's and external ear canals both ears Throat: lips, mucosa, and tongue normal; teeth and gums normal Neck: no adenopathy, no carotid bruit, supple, symmetrical, trachea midline and thyroid not enlarged, symmetric, no tenderness/mass/nodules Back: symmetric, no curvature. ROM normal. No CVA tenderness. Lungs: clear to auscultation bilaterally Heart: regular rate and rhythm, S1, S2 normal, no murmur, click, rub or gallop Abdomen: soft, non-tender; bowel sounds normal; no masses,  no organomegaly, no hernia.  Spine:  kyphoscoliosis  Pulses: 2+ and symmetric Skin: Skin color, texture, turgor normal. No rashes or lesions Lymph nodes: Cervical, supraclavicular, and axillary nodes normal.  No results found for: HGBA1C  Lab Results  Component Value Date   CREATININE 0.70 10/16/2020   CREATININE 0.86 08/29/2020   CREATININE 0.83 07/26/2020    Lab Results  Component Value Date   WBC 9.2 08/29/2020   HGB 12.7 08/29/2020   HCT 36.9 08/29/2020   PLT 304 08/29/2020   GLUCOSE 100 (H) 08/29/2020   CHOL 254 (H) 06/09/2016   TRIG 103.0 06/09/2016   HDL 60.00 06/09/2016   LDLDIRECT 145.6 12/21/2012   LDLCALC 174 (H) 06/09/2016   ALT 9 06/02/2019   AST 17 06/02/2019   NA 134 (L) 08/29/2020   K 4.6 08/29/2020   CL 100 08/29/2020   CREATININE 0.70 10/16/2020   BUN 16 08/29/2020   CO2 29 08/29/2020   TSH 1.38 09/14/2019   INR 0.99 11/30/2016    CT ABDOMEN PELVIS W CONTRAST  Result Date: 10/18/2020 CLINICAL DATA:  Patient complains of left-sided abdominal bloating and discomfort since 06/2020. Patient denies history of COVID-19 and skin cancer. History of laparoscopic bilateral salpingo-oophorectomy on 09/02/2020. EXAM: CT ABDOMEN AND PELVIS WITH CONTRAST TECHNIQUE: Multidetector CT imaging of the abdomen and pelvis was performed using the standard protocol following bolus administration of intravenous  contrast. CONTRAST:  64mL OMNIPAQUE IOHEXOL 300 MG/ML  SOLN COMPARISON:  U/S pelvis 06/13/2019; CT abdomen and pelvis 06/07/2018; U/S renal 10/29/2017. FINDINGS: Lower chest: Chronic increased reticular interstitial markings are noted within both lung bases. Subpleural scarring identified within the posterior right base. Hepatobiliary: No focal liver abnormality is seen. No gallstones, gallbladder wall thickening, or biliary dilatation. Pancreas: Unremarkable. No pancreatic ductal dilatation or surrounding inflammatory changes. Spleen: Normal in size without focal abnormality. Adrenals/Urinary Tract: Adrenal glands are normal. No kidney mass, hydronephrosis or nephrolithiasis. Bladder appears distended measuring 10.9 x 6.0 x 11.0 cm (volume = 380 cm^3). Stomach/Bowel: Stomach is normal. The appendix is visualized and is within normal limits. No small bowel wall thickening, inflammation, or distension. There is a moderate stool burden identified throughout the colon. Extensive sigmoid diverticulosis identified. No convincing evidence for acute diverticulitis at this time. Vascular/Lymphatic: Aortic atherosclerosis. No  aneurysm. No abdominopelvic adenopathy. Reproductive: Uterus containing several calcifications appears normal for age. No adnexal mass. Other: No free fluid or fluid collections. Asymmetric laxity of the left lower quadrant ventral abdominal wall is identified without hernia defect but with mild protuberance of nonobstructed loops of small and large bowel, image 53/2. Musculoskeletal: Scoliosis and degenerative disc disease identified. No acute chronic wedge deformity involving the L1 vertebral body is unchanged from previous exam. No acute or suspicious osseous findings. IMPRESSION: 1. No acute findings identified within the abdomen or pelvis. 2. Extensive sigmoid diverticulosis without convincing evidence for acute diverticulitis at this time. 3. Moderate stool burden identified throughout the colon.  Correlate for any clinical signs or symptoms of constipation. 4. Asymmetric laxity of the left lower quadrant ventral abdominal wall without hernia defect but with mild protuberances of nonobstructed loops of small and large bowel. 5. Aortic Atherosclerosis (ICD10-I70.0). Electronically Signed   By: Kerby Moors M.D.   On: 10/18/2020 13:20    Assessment & Plan:   Problem List Items Addressed This Visit     Constipation - Primary   Relevant Orders   Ambulatory referral to Gastroenterology   Abdominal swelling, left lower quadrant    I again reviewed the findings of the most recent CT abd and pelvis which ruled out r/p mass but noted thinning of the abd wall In this area with Pre-hernia physiology . The cause is unclear: She has no history of abdominal incision or penetrating trauma in this area but has a non surgical appearing stellate shaped scar overlying the area of thinning.  I recommend that she continue to use compressive undergarments and practice avoidance of activities that put a strain on the abd wall.   She is adamant abut requesting an evaluation by Dr Alice Reichert       Relevant Orders   Ambulatory referral to Gastroenterology    I am having [employer]" maintain her Lutein, vitamin E, Garlic Oil, sennosides-docusate sodium, vitamin C, FIBER PO, Propylene Glycol (SYSTANE BALANCE OP), propranolol ER, amLODipine, diphenhydramine-acetaminophen, Calcium Carb-Cholecalciferol (CALCIUM 600/VITAMIN D3 PO), and diazepam.  No orders of the defined types were placed in this encounter.    I provided  30 minutes of  face-to-face time during this encounter reviewing patient's current problems and past surgeries, labs and imaging studies, providing counseling on the above mentioned problems , and coordination  of care .   Follow-up: No follow-ups on file.   Crecencio Mc, MD

## 2020-12-12 DIAGNOSIS — M4185 Other forms of scoliosis, thoracolumbar region: Secondary | ICD-10-CM | POA: Diagnosis not present

## 2020-12-12 DIAGNOSIS — R1907 Generalized intra-abdominal and pelvic swelling, mass and lump: Secondary | ICD-10-CM | POA: Diagnosis not present

## 2020-12-12 DIAGNOSIS — I10 Essential (primary) hypertension: Secondary | ICD-10-CM | POA: Diagnosis not present

## 2020-12-12 DIAGNOSIS — M5137 Other intervertebral disc degeneration, lumbosacral region: Secondary | ICD-10-CM | POA: Diagnosis not present

## 2020-12-12 DIAGNOSIS — Z9181 History of falling: Secondary | ICD-10-CM | POA: Diagnosis not present

## 2020-12-12 DIAGNOSIS — G8929 Other chronic pain: Secondary | ICD-10-CM | POA: Diagnosis not present

## 2020-12-16 DIAGNOSIS — R1900 Intra-abdominal and pelvic swelling, mass and lump, unspecified site: Secondary | ICD-10-CM | POA: Diagnosis not present

## 2020-12-16 DIAGNOSIS — K5909 Other constipation: Secondary | ICD-10-CM | POA: Diagnosis not present

## 2020-12-16 DIAGNOSIS — K5730 Diverticulosis of large intestine without perforation or abscess without bleeding: Secondary | ICD-10-CM | POA: Diagnosis not present

## 2020-12-23 ENCOUNTER — Other Ambulatory Visit: Payer: Self-pay

## 2020-12-23 ENCOUNTER — Ambulatory Visit
Admission: EM | Admit: 2020-12-23 | Discharge: 2020-12-23 | Disposition: A | Discharge status: Home / Self Care | Payer: Medicare Other | Attending: Internal Medicine | Admitting: Internal Medicine

## 2020-12-23 DIAGNOSIS — M5431 Sciatica, right side: Secondary | ICD-10-CM

## 2020-12-23 MED ORDER — METHYLPREDNISOLONE 4 MG PO TBPK: ORAL_TABLET | ORAL | 0 refills | Status: DC

## 2020-12-23 NOTE — Discharge Instructions (Signed)
Follow up with your primary care doctor next week

## 2020-12-23 NOTE — ED Triage Notes (Signed)
Pt c/o fall on her right side on 12/08/20. Pt states that she has not had any pain when she fell, but the pain has slowly gotten worse over the last week.  Pt states that when she fell, she was attempting to sit down and the stool slid from under her.

## 2020-12-23 NOTE — ED Provider Notes (Signed)
[facility]    CSN: [id number] Arrival date & time: 12/23/20  0801      History   Chief Complaint Chief Complaint  Patient presents with   Fall   Hip Pain    HPI [name] is a 87 y.o. female who presents with R SI/buttocks pain for the past week. She fell on her R lateral hip on 12/4 but did not have pain til a week later when she was standing for prolonged periods of time a couple of days which seemed to make things worse. Pain is provoked when she extends her leg out to take a step, but is able to bare wt. Has hx of scoliosis and swelling on L lower abdomen related to her scoliosis per CT of abdomen this year. She would like me to check her abdomen today as well.Her PCP told her the abdominal fullness on her LLQ is from her scolisis    Past Medical History:  Diagnosis Date   Anxiety    Arthritis    "may have a touch; all over I guess" (10/11/2017)   Back pain    "all my back" (10/11/2017)   Basal cell carcinoma    "have had many taken off; burned" (10/11/2017)   Chronic orthostatic hypotension 06/2010   with pprior syncopal event,  did not tolelrate florinef trial   Complication of anesthesia    2019 ws very confused for several days after anesthesia   Constipation 08/12/2013   Depression    Hyperlipidemia    Hypertension    Tibia/fibula fracture 10/09/2017   Urinary frequency     Patient Active Problem List   Diagnosis Date Noted   Edema of both lower extremities 09/16/2019   Hair changes 01/10/2019   Abdominal swelling, left lower quadrant 11/15/2018   Cervical radiculopathy due to degenerative joint disease of spine 04/07/2018   CVD (cerebrovascular disease) 04/07/2018   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   Osteoporosis 03/13/2015   Raynaud phenomenon 02/24/2015   Hyponatremia 02/09/2015   Medicare annual wellness visit, subsequent 01/06/2015   Encounter for preventive health examination 01/06/2015   Underweight  06/15/2014   Chronic left hip pain 06/13/2014   Benign essential tremor 10/02/2013   Constipation 08/12/2013   Urinary incontinence, urge 05/29/2012   Screening for breast cancer 10/27/2011   Degeneration of lumbar or lumbosacral intervertebral disc 09/01/2011   Insomnia 01/07/2011   Hyperlipidemia LDL goal <130 11/08/2010   Back pain, sacroiliac 11/08/2010   Anxiety    Essential hypertension 09/18/2010    Past Surgical History:  Procedure Laterality Date   ANKLE FRACTURE SURGERY Left 1998   CATARACT EXTRACTION W/ INTRAOCULAR LENS  IMPLANT, BILATERAL Bilateral    DILATION AND CURETTAGE OF UTERUS     FRACTURE SURGERY     IM NAILING TIBIA Left 10/10/2017   LAPAROSCOPIC BILATERAL SALPINGO OOPHERECTOMY Bilateral 09/02/2020   Procedure: LAPAROSCOPIC BILATERAL SALPINGO OOPHORECTOMY;  Surgeon: Schermerhorn, [name], MD;  Location: [facility];  Service: Gynecology;  Laterality: Bilateral;   TIBIA IM NAIL INSERTION Left 10/10/2017   Procedure: INTRAMEDULLARY (IM) NAIL TIBIAL;  Surgeon: Leandrew Koyanagi, MD;  Location: [facility];  Service: Orthopedics;  Laterality: Left;   WRIST FRACTURE SURGERY Right 2005    OB History   No obstetric history on file.      Home Medications    Prior to Admission medications   Medication Sig Start Date End Date Taking? Authorizing Provider  amLODipine (NORVASC) 5 MG tablet TAKE  ONE TABLET BY MOUTH ONCE DAILY 08/07/20  Yes Crecencio Mc, MD  Ascorbic Acid (VITAMIN C) 1000 MG tablet Take 1,000 mg by mouth every other day.   Yes [provider]  Calcium Carb-Cholecalciferol (CALCIUM 600/VITAMIN D3 PO) Take 1 tablet by mouth daily.   Yes [provider]  diazepam (VALIUM) 5 MG tablet TAKE ONE TABLET BY MOUTH ONCE DAILY AS NEEDED FOR ANXIETY OR FOR MUSCLE SPASMS 09/11/20  Yes Crecencio Mc, MD  diphenhydramine-acetaminophen (TYLENOL PM) 25-500 MG TABS tablet Take 1 tablet by mouth at bedtime.   Yes [provider]  FIBER PO Take 1 capsule  by mouth in the morning, at noon, and at bedtime.   Yes [provider]  Garlic Oil 7989 MG CAPS Take 1,000 mg by mouth daily.   Yes [provider]  Lutein 20 MG CAPS Take 20 mg by mouth daily.     Yes [provider]  methylPREDNISolone (MEDROL DOSEPAK) 4 MG TBPK tablet Take as directed per pack 12/23/20  Yes Rodriguez-Southworth, Sunday Spillers, PA-C  propranolol ER (INDERAL LA) 60 MG 24 hr capsule TAKE ONE CAPSULE BY MOUTH ONCE DAILY Patient taking differently: Take 60 mg by mouth at bedtime. 06/10/20  Yes Crecencio Mc, MD  Propylene Glycol (SYSTANE BALANCE OP) Place 1 drop into both eyes 4 (four) times daily as needed (dry eyes).   Yes [provider]  sennosides-docusate sodium (SENOKOT-S) 8.6-50 MG tablet Take 1 tablet by mouth daily as needed for constipation. 10/14/17  Yes [provider]  vitamin E 180 MG (400 UNITS) capsule Take 400 Units by mouth daily.    Yes [provider]    Family History Family History  Problem Relation Age of Onset   Colon cancer Mother    Heart disease Father    Kidney disease Father    Cancer Neg Hx    Diabetes Neg Hx     Social History Social History   Tobacco Use   Smoking status: Never   Smokeless tobacco: Never  Vaping Use   Vaping Use: Never used  Substance Use Topics   Alcohol use: Never   Drug use: Never     Allergies   Patient has no known allergies.   Review of Systems Review of Systems  Constitutional:  Negative for fever.  Gastrointestinal:  Positive for abdominal distention and constipation. Negative for diarrhea, nausea and vomiting.  Genitourinary:  Negative for dysuria.  Musculoskeletal:  Positive for back pain.  Neurological:  Positive for weakness. Negative for numbness.    Physical Exam Triage Vital Signs ED Triage Vitals  Enc Vitals Group     BP 12/23/20 0818 (!) 165/76     Pulse Rate 12/23/20 0818 (!) 58     Resp 12/23/20 0818 18     Temp 12/23/20 0818 97.8  F (36.6 C)     Temp Source 12/23/20 0818 Oral     SpO2 12/23/20 0818 93 %     Weight 12/23/20 0814 130 lb (59 kg)     Height 12/23/20 0814 5\' 6"  (1.676 m)     Head Circumference --      Peak Flow --      Pain Score 12/23/20 0813 3     Pain Loc --      Pain Edu? --      Excl. in [place]? --    No data found.  Updated Vital Signs BP (!) 165/76 (BP Location: Left Arm)    Pulse [name])  58    Temp 97.8 F (36.6 C) (Oral)    Resp 18    Ht 5\' 6"  (1.676 m)    Wt 130 lb (59 kg)    SpO2 93%    BMI 20.98 kg/m   Visual Acuity Right Eye Distance:   Left Eye Distance:   Bilateral Distance:    Right Eye Near:   Left Eye Near:    Bilateral Near:     Physical Exam Vitals and nursing note reviewed.  Constitutional:      General: She is not in acute distress.    Appearance: She is normal weight. She is not toxic-appearing.  HENT:     Head: Normocephalic.     Right Ear: External ear normal.     Left Ear: External ear normal.  Eyes:     General: No scleral icterus.    Conjunctiva/sclera: Conjunctivae normal.  Pulmonary:     Effort: Pulmonary effort is normal.  Abdominal:     General: There is distension.     Palpations: Abdomen is soft. There is no mass.     Tenderness: There is no abdominal tenderness. There is no guarding or rebound.     Hernia: No hernia is present.     Comments: Seems to have distended lower abdomen, and little more puffy on the LLQ  Musculoskeletal:        General: No tenderness, deformity or signs of injury. Normal range of motion.     Cervical back: Neck supple.     Right lower leg: No edema.     Left lower leg: No edema.     Comments: BACK- has scoliosis, but no lumbar vertebral tenderness noted. Has mild tenderness on R SI region and piriformis area. Neg SLR. Spine ROM is normal, and anterior flexion provoked pain. She was anabel to fully place weight when he would take a step from walking, but was able to place wt on this leg when asked to stand in one leg at a  time.   R HIP- equal length as the L. ROM is normal with no hip pain, only had mild R SI pain with ROM  Skin:    General: Skin is warm and dry.     Findings: No bruising or rash.  Neurological:     Mental Status: She is alert and oriented to person, place, and time.     Motor: No weakness.     Gait: Gait abnormal.     Deep Tendon Reflexes: Reflexes normal.     Comments: R achillis reflex +1/4, L achillis and both patella are +2/4  Psychiatric:        Mood and Affect: Mood normal.        Behavior: Behavior normal.        Thought Content: Thought content normal.        Judgment: Judgment normal.     UC Treatments / Results  Labs (all labs ordered are listed, but only abnormal results are displayed) Labs Reviewed - No data to display  EKG   Radiology No results found.  Procedures Procedures (including critical care time)  Medications Ordered in UC Medications - No data to display  Initial Impression / Assessment and Plan / UC Course  I have reviewed the triage vital signs and the nursing notes. I reviewed her old lumbar MRI and abdominal CT I believe she has R sciatica and I placed her on Medrol and needs to FU with PCP next week.  Final Clinical Impressions(s) / UC Diagnoses   Final diagnoses:  Sciatica of right side     Discharge Instructions      Follow up with your primary care doctor next week     ED Prescriptions     Medication Sig Dispense Auth. Provider   methylPREDNISolone (MEDROL DOSEPAK) 4 MG TBPK tablet Take as directed per pack 21 tablet Rodriguez-Southworth, Sunday Spillers, PA-C      PDMP not reviewed this encounter.   Shelby Mattocks, Hershal Coria 12/23/20 2016

## 2020-12-31 DIAGNOSIS — R140 Abdominal distension (gaseous): Secondary | ICD-10-CM | POA: Diagnosis not present

## 2021-01-01 NOTE — Telephone Encounter (Signed)
Please sign the encounters.

## 2021-01-01 NOTE — Telephone Encounter (Signed)
Order has been canceled.

## 2021-01-27 ENCOUNTER — Other Ambulatory Visit: Payer: Self-pay | Admitting: Internal Medicine

## 2021-02-12 DIAGNOSIS — H5203 Hypermetropia, bilateral: Secondary | ICD-10-CM | POA: Diagnosis not present

## 2021-02-12 DIAGNOSIS — H52223 Regular astigmatism, bilateral: Secondary | ICD-10-CM | POA: Diagnosis not present

## 2021-02-12 DIAGNOSIS — H3562 Retinal hemorrhage, left eye: Secondary | ICD-10-CM | POA: Diagnosis not present

## 2021-02-12 DIAGNOSIS — Z9842 Cataract extraction status, left eye: Secondary | ICD-10-CM | POA: Diagnosis not present

## 2021-02-12 DIAGNOSIS — Z9841 Cataract extraction status, right eye: Secondary | ICD-10-CM | POA: Diagnosis not present

## 2021-02-13 DIAGNOSIS — H353221 Exudative age-related macular degeneration, left eye, with active choroidal neovascularization: Secondary | ICD-10-CM | POA: Diagnosis not present

## 2021-02-13 DIAGNOSIS — H3411 Central retinal artery occlusion, right eye: Secondary | ICD-10-CM | POA: Diagnosis not present

## 2021-02-23 ENCOUNTER — Other Ambulatory Visit: Payer: Self-pay | Admitting: Internal Medicine

## 2021-03-04 ENCOUNTER — Other Ambulatory Visit: Payer: Self-pay | Admitting: Internal Medicine

## 2021-03-07 NOTE — Telephone Encounter (Signed)
Pt has had to change pharmacies to [facility] on [address] because [facility] has closed down. They couldn't transfer the Diazepam so pt would like to have the rx sent to [facility]. Pharmacy has been changed.  ? ?Refilled: 02/03/2021 ?Last OV: 12/11/2020 ?Next OV: not scheduled ? ?Pt has enough to get through till Monday. ?

## 2021-03-07 NOTE — Addendum Note (Signed)
Addended by: Adair Laundry on: 03/07/2021 03:23 PM ? ? Modules accepted: Orders ? ?

## 2021-03-07 NOTE — Telephone Encounter (Signed)
Pt need refill on this medication diazepam sent to [facility] instead of [facility] because they have closed  ?

## 2021-03-08 MED ORDER — DIAZEPAM 5 MG PO TABS: ORAL_TABLET | ORAL | 2 refills | Status: DC

## 2021-03-13 DIAGNOSIS — H353221 Exudative age-related macular degeneration, left eye, with active choroidal neovascularization: Secondary | ICD-10-CM | POA: Diagnosis not present

## 2021-03-13 DIAGNOSIS — H3411 Central retinal artery occlusion, right eye: Secondary | ICD-10-CM | POA: Diagnosis not present

## 2021-03-26 DIAGNOSIS — Z08 Encounter for follow-up examination after completed treatment for malignant neoplasm: Secondary | ICD-10-CM | POA: Diagnosis not present

## 2021-03-26 DIAGNOSIS — D0471 Carcinoma in situ of skin of right lower limb, including hip: Secondary | ICD-10-CM | POA: Diagnosis not present

## 2021-03-26 DIAGNOSIS — Z85820 Personal history of malignant melanoma of skin: Secondary | ICD-10-CM | POA: Diagnosis not present

## 2021-03-26 DIAGNOSIS — C44619 Basal cell carcinoma of skin of left upper limb, including shoulder: Secondary | ICD-10-CM | POA: Diagnosis not present

## 2021-03-26 DIAGNOSIS — C44519 Basal cell carcinoma of skin of other part of trunk: Secondary | ICD-10-CM | POA: Diagnosis not present

## 2021-03-26 DIAGNOSIS — C44319 Basal cell carcinoma of skin of other parts of face: Secondary | ICD-10-CM | POA: Diagnosis not present

## 2021-03-26 DIAGNOSIS — L570 Actinic keratosis: Secondary | ICD-10-CM | POA: Diagnosis not present

## 2021-03-26 DIAGNOSIS — X32XXXA Exposure to sunlight, initial encounter: Secondary | ICD-10-CM | POA: Diagnosis not present

## 2021-04-07 ENCOUNTER — Telehealth: Payer: Self-pay | Admitting: Internal Medicine

## 2021-04-07 DIAGNOSIS — K5900 Constipation, unspecified: Secondary | ICD-10-CM

## 2021-04-07 DIAGNOSIS — R1904 Left lower quadrant abdominal swelling, mass and lump: Secondary | ICD-10-CM

## 2021-04-07 NOTE — Telephone Encounter (Addendum)
Pt called in wanting to see if she can get a referral to dr Chales Abrahams on [address] in [place]. Pt would like to be called  ?

## 2021-04-08 NOTE — Telephone Encounter (Signed)
Patient called about getting a referral to Dr Chales Abrahams. ?

## 2021-04-08 NOTE — Telephone Encounter (Signed)
I have pended the referral for Dr. Chales Abrahams at [facility] at [facility].  ?

## 2021-04-10 NOTE — Telephone Encounter (Signed)
LMTCB

## 2021-04-10 NOTE — Telephone Encounter (Signed)
Pt called and she wanted to see they can tell her anything else about her stomach because it is still swollen. The reason for the referral ?

## 2021-04-10 NOTE — Telephone Encounter (Signed)
Pt is wanting the referral to GI for the abdominal swelling she is still having.  ?

## 2021-04-15 NOTE — Telephone Encounter (Signed)
Patient called and note from Dr Derrel Nip has been read to patient. Patient would like a referral to another GI for a 2nd opinion. She states your stomach just doesn't swell for no reason like hers is. ?

## 2021-04-18 NOTE — Telephone Encounter (Signed)
Spoke with pt and and scheduled her for a follow up appt with Dr. Derrel Nip.  ?

## 2021-04-22 ENCOUNTER — Ambulatory Visit (INDEPENDENT_AMBULATORY_CARE_PROVIDER_SITE_OTHER): Payer: Medicare Other | Admitting: Internal Medicine

## 2021-04-22 ENCOUNTER — Encounter: Payer: Self-pay | Admitting: Internal Medicine

## 2021-04-22 VITALS — BP 162/76 | HR 60 | Temp 98.3°F | Ht 66.0 in | Wt 131.2 lb

## 2021-04-22 DIAGNOSIS — R3911 Hesitancy of micturition: Secondary | ICD-10-CM | POA: Diagnosis not present

## 2021-04-22 DIAGNOSIS — R1904 Left lower quadrant abdominal swelling, mass and lump: Secondary | ICD-10-CM | POA: Diagnosis not present

## 2021-04-22 NOTE — Assessment & Plan Note (Signed)
I spent  38 minutes reviewing her previous workup  .  She has no mass or hernia by previous imaging studies.   Ruling out urinary retention with post void residual.  ?

## 2021-04-22 NOTE — Patient Instructions (Addendum)
Your abdominal swelling is NOT  due to a mass or a hernia ? ?You MAY have a bladder that is not emptying fully and as a result will gradually enlarge and cause the swelling you are feeling ? ? ?An ultrasound of your bladder (before and after you empty it) will give [place] this kind of information , so I have ordered this test  to be done in the next few weeks at [facility]. The radiology dept will call you with the appointment and the directions  ? ? ? ? ?

## 2021-04-22 NOTE — Progress Notes (Signed)
? ?Subjective:  ?Patient ID: [name], female    DOB: 18-Nov-1926  Age: 88 y.o. MRN: [id number] ? ?CC: The primary encounter diagnosis was Urinary hesitancy. A diagnosis of Abdominal swelling, left lower quadrant was also pertinent to this visit. ? ? ?This visit occurred during the SARS-CoV-2 public health emergency.  Safety protocols were in place, including screening questions prior to the visit, additional usage of staff PPE, and extensive cleaning of exam room while observing appropriate contact time as indicated for disinfecting solutions.   ? ?HPI ?[name] presents for follow up on abdominal abnormality ?Chief Complaint  ?Patient presents with  ? Follow-up  ?  Follow up on abdominal swelling. Pt would like a 2nd opinion.   ? ?[name] is a 88 yr old female with lumbar scoliosis ,  aortic atherosclerosis and cerebrovascular disease who has had multiple evaluations  over the past 2-3 years for asymmetric swelling of the left lower quadrant.  She is quite frustrated by the shape of her abdomen and the effect that it has on her ability to wear the clothes she has worn for years , and is adamant that "something must be done ."  .  She has had exploratory lap by GYN last year which as negative for mass.  She has had CT abd and pelvis along with General surgery consult in December for possible LLQ hernia. although abdominal wall laxity of muscles was noted  she had no evidence of hernia and was advised to wear a compression undergarment and consider a bladder ultrasound to rule out urinary retention  which she has declined thus far , but she has requested another opinion from [facility]  ? ?She denies pain,  weight loss.  She does endorse urinary hesitancy and retention  ? ? ?Outpatient Medications Prior to Visit  ?Medication Sig Dispense Refill  ? amLODipine (NORVASC) 5 MG tablet TAKE ONE TABLET BY MOUTH ONCE DAILY 90 tablet 1  ? Ascorbic Acid (VITAMIN C) 1000 MG tablet Take 1,000 mg by mouth every  other day.    ? Calcium Carb-Cholecalciferol (CALCIUM 600/VITAMIN D3 PO) Take 1 tablet by mouth daily.    ? diazepam (VALIUM) 5 MG tablet TAKE ONE TABLET BY MOUTH ONCE DAILY AS NEEDED FOR ANXIETY OR FOR MUSCLE SPASMS 30 tablet 2  ? diphenhydramine-acetaminophen (TYLENOL PM) 25-500 MG TABS tablet Take 1 tablet by mouth at bedtime.    ? FIBER PO Take 1 capsule by mouth in the morning, at noon, and at bedtime.    ? Garlic Oil 5916 MG CAPS Take 1,000 mg by mouth daily.    ? Lutein 20 MG CAPS Take 20 mg by mouth daily.      ? propranolol ER (INDERAL LA) 60 MG 24 hr capsule TAKE 1 CAPSULE BY MOUTH EVERY DAY 90 capsule 1  ? Propylene Glycol (SYSTANE BALANCE OP) Place 1 drop into both eyes 4 (four) times daily as needed (dry eyes).    ? sennosides-docusate sodium (SENOKOT-S) 8.6-50 MG tablet Take 1 tablet by mouth daily as needed for constipation.    ? vitamin E 180 MG (400 UNITS) capsule Take 400 Units by mouth daily.     ? methylPREDNISolone (MEDROL DOSEPAK) 4 MG TBPK tablet Take as directed per pack (Patient not taking: Reported on 04/22/2021) 21 tablet 0  ? ?No facility-administered medications prior to visit.  ? ? ?Review of Systems; ? ?Patient denies headache, fevers, malaise, unintentional weight loss, skin rash, eye pain, sinus congestion and  sinus pain, sore throat, dysphagia,  hemoptysis , cough, dyspnea, wheezing, chest pain, palpitations, orthopnea, edema, abdominal pain, nausea, melena, diarrhea, constipation, flank pain, dysuria, hematuria, urinary  Frequency, nocturia, numbness, tingling, seizures,  Focal weakness, Loss of consciousness,  Tremor, insomnia, depression, anxiety, and suicidal ideation.   ? ? ? ?Objective:  ?BP (!) 162/76 (BP Location: Left Arm, Patient Position: Sitting, Cuff Size: Normal)   Pulse 60   Temp 98.3 ?F (36.8 ?C) (Oral)   Ht '5\' 6"'$  (1.676 m)   Wt 131 lb 3.2 oz (59.5 kg)   SpO2 96%   BMI 21.18 kg/m?  ? ?BP Readings from Last 3 Encounters:  ?04/22/21 (!) 162/76  ?12/23/20 (!)  165/76  ?12/11/20 132/66  ? ? ?Wt Readings from Last 3 Encounters:  ?04/22/21 131 lb 3.2 oz (59.5 kg)  ?12/23/20 130 lb (59 kg)  ?12/11/20 131 lb 12.8 oz (59.8 kg)  ? ? ?General appearance: alert, cooperative and appears stated age ?Ears: normal TM's and external ear canals both ears ?Throat: lips, mucosa, and tongue normal; teeth and gums normal ?Neck: no adenopathy, no carotid bruit, supple, symmetrical, trachea midline and thyroid not enlarged, symmetric, no tenderness/mass/nodules ?Back: symmetric, no curvature. ROM normal. No CVA tenderness. ?Lungs: clear to auscultation bilaterally ?Heart: regular rate and rhythm, S1, S2 normal, no murmur, click, rub or gallop ?Abdomen: soft, non-tender; bowel sounds normal; no masses,  no organomegaly ?Pulses: 2+ and symmetric ?Skin: Skin color, texture, turgor normal. No rashes or lesions ?Lymph nodes: Cervical, supraclavicular, and axillary nodes normal. ? ?No results found for: HGBA1C ? ?Lab Results  ?Component Value Date  ? CREATININE 0.70 10/16/2020  ? CREATININE 0.86 08/29/2020  ? CREATININE 0.83 07/26/2020  ? ? ?Lab Results  ?Component Value Date  ? WBC 9.2 08/29/2020  ? HGB 12.7 08/29/2020  ? HCT 36.9 08/29/2020  ? PLT 304 08/29/2020  ? GLUCOSE 100 (H) 08/29/2020  ? CHOL 254 (H) 06/09/2016  ? TRIG 103.0 06/09/2016  ? HDL 60.00 06/09/2016  ? LDLDIRECT 145.6 12/21/2012  ? LDLCALC 174 (H) 06/09/2016  ? ALT 9 06/02/2019  ? AST 17 06/02/2019  ? NA 134 (L) 08/29/2020  ? K 4.6 08/29/2020  ? CL 100 08/29/2020  ? CREATININE 0.70 10/16/2020  ? BUN 16 08/29/2020  ? CO2 29 08/29/2020  ? TSH 1.38 09/14/2019  ? INR 0.99 11/30/2016  ? ? ?No results found. ? ?Assessment & Plan:  ? ?Problem List Items Addressed This Visit   ? ? Abdominal swelling, left lower quadrant  ?  I spent  38 minutes reviewing her previous workup  .  She has no mass or hernia by previous imaging studies.   Ruling out urinary retention with post void residual.  ? ?  ?  ? ?Other Visit Diagnoses   ? ? Urinary  hesitancy    -  Primary  ? Relevant Orders  ? US PELVIS LIMITED (TRANSABDOMINAL ONLY)  ? ?  ? ? ?Crecencio Mc, MD ?

## 2021-05-01 ENCOUNTER — Ambulatory Visit
Admission: RE | Admit: 2021-05-01 | Discharge: 2021-05-01 | Disposition: A | Discharge status: Home / Self Care | Payer: Medicare Other | Source: Ambulatory Visit | Attending: Internal Medicine | Admitting: Internal Medicine

## 2021-05-01 DIAGNOSIS — R339 Retention of urine, unspecified: Secondary | ICD-10-CM | POA: Diagnosis not present

## 2021-05-01 DIAGNOSIS — R3911 Hesitancy of micturition: Secondary | ICD-10-CM | POA: Insufficient documentation

## 2021-05-06 DIAGNOSIS — H3411 Central retinal artery occlusion, right eye: Secondary | ICD-10-CM | POA: Diagnosis not present

## 2021-05-06 DIAGNOSIS — H353221 Exudative age-related macular degeneration, left eye, with active choroidal neovascularization: Secondary | ICD-10-CM | POA: Diagnosis not present

## 2021-05-29 ENCOUNTER — Ambulatory Visit (INDEPENDENT_AMBULATORY_CARE_PROVIDER_SITE_OTHER): Payer: Medicare Other

## 2021-05-29 VITALS — Ht 66.0 in | Wt 131.0 lb

## 2021-05-29 DIAGNOSIS — Z0000 Encounter for general adult medical examination without abnormal findings: Secondary | ICD-10-CM

## 2021-05-29 NOTE — Progress Notes (Addendum)
Subjective:   [name] is a 88 y.o. female who presents for Medicare Annual (Subsequent) preventive examination.  Review of Systems     Cardiac Risk Factors include: advanced age (>37mn, >>64women);hypertension     Objective:    Today's Vitals   05/29/21 0840  Weight: 131 lb (59.4 kg)  Height: '5\' 6"'$  (1.676 m)   Body mass index is 21.14 kg/m.     05/29/2021    8:44 AM 12/23/2020    8:18 AM 09/02/2020    9:22 AM 08/23/2020   11:21 AM 05/27/2020    3:54 PM 05/25/2019   11:55 AM 12/04/2017   10:17 AM  Advanced Directives  Does Patient Have a Medical Advance Directive? Yes Yes Yes Yes Yes Yes Yes  Type of AParamed[place]of [place]Living will [place]Living will HG[place]Living will HM[place]Living will HV[place]Living will H[place]Living will H[place] GroveLiving will  Does patient want to make changes to medical advance directive? No - Patient declined  No - Patient declined No - Patient declined No - Patient declined No - Patient declined   Copy of HShastain Chart? Yes - validated most recent copy scanned in chart (See row information)  No - copy requested No - copy requested Yes - validated most recent copy scanned in chart (See row information) Yes - validated most recent copy scanned in chart (See row information)     Current Medications (verified) Outpatient Encounter Medications as of 05/29/2021  Medication Sig   amLODipine (NORVASC) 5 MG tablet TAKE ONE TABLET BY MOUTH ONCE DAILY   Ascorbic Acid (VITAMIN C) 1000 MG tablet Take 1,000 mg by mouth every other day.   Calcium Carb-Cholecalciferol (CALCIUM 600/VITAMIN D3 PO) Take 1 tablet by mouth daily.   diazepam (VALIUM) 5 MG tablet TAKE ONE TABLET BY MOUTH ONCE DAILY AS NEEDED FOR ANXIETY OR FOR MUSCLE SPASMS   diphenhydramine-acetaminophen (TYLENOL PM) 25-500  MG TABS tablet Take 1 tablet by mouth at bedtime.   FIBER PO Take 1 capsule by mouth in the morning, at noon, and at bedtime.   Garlic Oil 19628MG CAPS Take 1,000 mg by mouth daily.   Lutein 20 MG CAPS Take 20 mg by mouth daily.     propranolol ER (INDERAL LA) 60 MG 24 hr capsule TAKE 1 CAPSULE BY MOUTH EVERY DAY   Propylene Glycol (SYSTANE BALANCE OP) Place 1 drop into both eyes 4 (four) times daily as needed (dry eyes).   sennosides-docusate sodium (SENOKOT-S) 8.6-50 MG tablet Take 1 tablet by mouth daily as needed for constipation.   vitamin E 180 MG (400 UNITS) capsule Take 400 Units by mouth daily.    No facility-administered encounter medications on file as of 05/29/2021.    Allergies (verified) Patient has no known allergies.   History: Past Medical History:  Diagnosis Date   Anxiety    Arthritis    "may have a touch; all over I guess" (10/11/2017)   Back pain    "all my back" (10/11/2017)   Basal cell carcinoma    "have had many taken off; burned" (10/11/2017)   Chronic orthostatic hypotension 06/2010   with pprior syncopal event,  did not tolelrate florinef trial   Complication of anesthesia    2019 ws very confused for several days after anesthesia   Constipation 08/12/2013   Depression    Hyperlipidemia  Hypertension    Tibia/fibula fracture 10/09/2017   Urinary frequency    Past Surgical History:  Procedure Laterality Date   ANKLE FRACTURE SURGERY Left 1998   CATARACT EXTRACTION W/ INTRAOCULAR LENS  IMPLANT, BILATERAL Bilateral    DILATION AND CURETTAGE OF UTERUS     FRACTURE SURGERY     IM NAILING TIBIA Left 10/10/2017   LAPAROSCOPIC BILATERAL SALPINGO OOPHERECTOMY Bilateral 09/02/2020   Procedure: LAPAROSCOPIC BILATERAL SALPINGO OOPHORECTOMY;  Surgeon: Schermerhorn, [name], MD;  Location: [facility];  Service: Gynecology;  Laterality: Bilateral;   TIBIA IM NAIL INSERTION Left 10/10/2017   Procedure: INTRAMEDULLARY (IM) NAIL TIBIAL;  Surgeon: Leandrew Koyanagi, MD;   Location: [facility];  Service: Orthopedics;  Laterality: Left;   WRIST FRACTURE SURGERY Right 2005   Family History  Problem Relation Age of Onset   Colon cancer Mother    Heart disease Father    Kidney disease Father    Cancer Neg Hx    Diabetes Neg Hx    Social History   Socioeconomic History   Marital status: Widowed    Spouse name: Not on file   Number of children: Not on file   Years of education: Not on file   Highest education level: Not on file  Occupational History   Occupation: retired    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  Tobacco Use   Smoking status: Never   Smokeless tobacco: Never  Scientific laboratory technician Use: Never used  Substance and Sexual Activity   Alcohol use: Never   Drug use: Never   Sexual activity: Not Currently  Other Topics Concern   Not on file  Social History Narrative   Not on file   Social Determinants of Health   Financial Resource Strain: Low Risk    Difficulty of Paying Living Expenses: Not hard at all  Food Insecurity: No Food Insecurity   Worried About Charity fundraiser in the Last Year: Never true   [place] in the Last Year: Never true  Transportation Needs: No Transportation Needs   Lack of Transportation (Medical): No   Lack of Transportation (Non-Medical): No  Physical Activity: Not on file  Stress: No Stress Concern Present   Feeling of Stress : Not at all  Social Connections: Unknown   Frequency of Communication with Friends and Family: More than three times a week   Frequency of Social Gatherings with Friends and Family: More than three times a week   Attends Religious Services: More than 4 times per year   Active Member of [employer] or Organizations: Not on file   Attends Archivist Meetings: Not on file   Marital Status: Widowed    Tobacco Counseling Counseling given: Not Answered   Clinical Intake:  Pre-visit preparation completed: Yes        Diabetes: No  How often  do you need to have someone help you when you read instructions, pamphlets, or other written materials from your doctor or pharmacy?: 1 - Never    Interpreter Needed?: No      Activities of Daily Living    05/29/2021    8:46 AM 08/23/2020   11:23 AM  In your present state of health, do you have any difficulty performing the following activities:  Hearing? 0   Vision? 0   Difficulty concentrating or making decisions? 0   Walking or climbing stairs? 0   Comment Paces self   Dressing or bathing?  0   Doing errands, shopping? 0 0  Preparing Food and eating ? N   Using the Toilet? N   In the past six months, have you accidently leaked urine? N   Comment Wears daily brief for protection   Do you have problems with loss of bowel control? N   Managing your Medications? N   Managing your Finances? N   Housekeeping or managing your Housekeeping? N   Comment Paces self     Patient Care Team: Crecencio Mc, MD as PCP - General (Internal Medicine)  Indicate any recent Medical Services you may have received from other than [facility] providers in the past year (date may be approximate).     Assessment:   This is a routine wellness examination for [name].  Virtual Visit via Telephone Note  I connected with  [name] on 05/29/21 at  8:30 AM EDT by telephone and verified that I am speaking with the correct person using two identifiers.  Persons participating in the virtual visit: patient/Nurse Health Advisor   I discussed the limitations of performing an evaluation and management service by telehealth. We continued and completed visit with audio only. Some vital signs may be absent or patient reported.   Hearing/Vision screen Hearing Screening - Comments:: Patient is able to hear conversational tones without difficulty. No issues reported. Vision Screening - Comments:: Wears corrective lenses  Cataract extraction, bilateral They have seen their ophthalmologist in the last 12  months.   Dietary issues and exercise activities discussed:   Healthy diet Fair water intake   Goals Addressed             This Visit's Progress    Maintain Healthy Lifestyle         Depression Screen    05/29/2021    8:43 AM 12/11/2020   11:17 AM 11/01/2020    1:36 PM 07/26/2020   11:44 AM 05/27/2020    3:48 PM 09/14/2019   12:16 PM 05/25/2019   11:57 AM  PHQ 2/9 Scores  PHQ - 2 Score 0 0 0 0 0 0 0    Fall Risk    05/29/2021    8:45 AM 12/11/2020   11:17 AM 11/01/2020    1:36 PM 07/26/2020   11:43 AM 05/27/2020    3:55 PM  Fall Risk   Falls in the past year? 0 0 0 0 0  Number falls in past yr: 0 0  0 0  Injury with Fall?  0  0 0  Risk for fall due to :  No Fall Risks History of fall(s)    Follow up Falls evaluation completed Falls evaluation completed Falls evaluation completed Falls evaluation completed Falls evaluation completed    [place]: Home free of loose throw rugs in walkways, pet beds, electrical cords, etc? Yes  Adequate lighting in your home to reduce risk of falls? Yes   ASSISTIVE DEVICES UTILIZED TO PREVENT FALLS: Life alert? Yes  Use of a cane, walker or w/c? No   TIMED UP AND GO: Was the test performed? No .   Cognitive Function:        05/27/2020    4:10 PM 05/25/2019   12:01 PM  6CIT Screen  What Year? 0 points 0 points  What month? 0 points 0 points  What time? 0 points 0 points  Count back from 20 0 points 0 points  Months in reverse 0 points 0 points  Immunizations Immunization History  Administered Date(s) Administered   Janssen (J&J) SARS-COV-2 Vaccination 07/01/2019    TDAP status: Due, Education has been provided regarding the importance of this vaccine. Advised may receive this vaccine at local pharmacy or Health Dept. Aware to provide a copy of the vaccination record if obtained from local pharmacy or Health Dept. Verbalized acceptance and understanding.  Screening Tests Health  Maintenance  Topic Date Due   COVID-19 Vaccine (2 - Booster for Janssen series) 06/14/2021 (Originally 08/26/2019)   Zoster Vaccines- Shingrix (1 of 2) 07/22/2021 (Originally 12/11/1978)   Pneumonia Vaccine 15+ Years old (1 - PCV) 04/23/2022 (Originally 12/10/1993)   TETANUS/TDAP  05/30/2022 (Originally 12/11/1945)   DEXA SCAN  Completed   HPV VACCINES  Aged Out   INFLUENZA VACCINE  Discontinued   Health Maintenance There are no preventive care reminders to display for this patient.  Lung Cancer Screening: (Low Dose CT Chest recommended if Age 36-80 years, 30 pack-year currently smoking OR have quit w/in 15years.) does not qualify.   Hepatitis C Screening: does not qualify.  Vision Screening: Recommended annual ophthalmology exams for early detection of glaucoma and other disorders of the eye.  Dental Screening: Recommended annual dental exams for proper oral hygiene  Community Resource Referral / Chronic Care Management: CRR required this visit?  No   CCM required this visit?  No      Plan:   Keep all routine maintenance appointments.   I have personally reviewed and noted the following in the patient's chart:   Medical and social history Use of alcohol, tobacco or illicit drugs  Current medications and supplements including opioid prescriptions.  Functional ability and status Nutritional status Physical activity Advanced directives List of other physicians Hospitalizations, surgeries, and ER visits in previous 12 months Vitals Screenings to include cognitive, depression, and falls Referrals and appointments  In addition, I have reviewed and discussed with patient certain preventive protocols, quality metrics, and best practice recommendations. A written personalized care plan for preventive services as well as general preventive health recommendations were provided to patient.     OBrien-Blaney, Shilah Hefel L, LPN   1/74/0814      I have reviewed the above information  and agree with above.   Deborra Medina, MD

## 2021-05-29 NOTE — Patient Instructions (Addendum)
  Ms. Nuzzo , Thank you for taking time to come for your Medicare Wellness Visit. I appreciate your ongoing commitment to your health goals. Please review the following plan we discussed and let me know if I can assist you in the future.   These are the goals we discussed:  Goals       Patient Stated     I try to drink 32 ounces of water daily (pt-stated)      Stay hydrated      Other     Maintain Healthy Lifestyle        This is a list of the screening recommended for you and due dates:  Health Maintenance  Topic Date Due   COVID-19 Vaccine (2 - Booster for [employer] series) 06/14/2021*   Zoster (Shingles) Vaccine (1 of 2) 07/22/2021*   Pneumonia Vaccine (1 - PCV) 04/23/2022*   Tetanus Vaccine  05/30/2022*   DEXA scan (bone density measurement)  Completed   HPV Vaccine  Aged Out   Flu Shot  Discontinued  *Topic was postponed. The date shown is not the original due date.

## 2021-06-02 ENCOUNTER — Ambulatory Visit
Admission: EM | Admit: 2021-06-02 | Discharge: 2021-06-02 | Disposition: A | Discharge status: Home / Self Care | Payer: Medicare Other

## 2021-06-02 DIAGNOSIS — R29810 Facial weakness: Secondary | ICD-10-CM | POA: Diagnosis not present

## 2021-06-02 DIAGNOSIS — I252 Old myocardial infarction: Secondary | ICD-10-CM | POA: Diagnosis not present

## 2021-06-02 DIAGNOSIS — K5900 Constipation, unspecified: Secondary | ICD-10-CM | POA: Diagnosis not present

## 2021-06-02 DIAGNOSIS — I6501 Occlusion and stenosis of right vertebral artery: Secondary | ICD-10-CM | POA: Diagnosis not present

## 2021-06-02 DIAGNOSIS — R531 Weakness: Secondary | ICD-10-CM | POA: Diagnosis not present

## 2021-06-02 DIAGNOSIS — H34231 Retinal artery branch occlusion, right eye: Secondary | ICD-10-CM | POA: Diagnosis not present

## 2021-06-02 DIAGNOSIS — R471 Dysarthria and anarthria: Secondary | ICD-10-CM | POA: Diagnosis not present

## 2021-06-02 DIAGNOSIS — I10 Essential (primary) hypertension: Secondary | ICD-10-CM | POA: Diagnosis not present

## 2021-06-02 DIAGNOSIS — E785 Hyperlipidemia, unspecified: Secondary | ICD-10-CM | POA: Diagnosis not present

## 2021-06-02 DIAGNOSIS — R1032 Left lower quadrant pain: Secondary | ICD-10-CM | POA: Diagnosis not present

## 2021-06-02 DIAGNOSIS — I6329 Cerebral infarction due to unspecified occlusion or stenosis of other precerebral arteries: Secondary | ICD-10-CM | POA: Diagnosis not present

## 2021-06-02 DIAGNOSIS — G936 Cerebral edema: Secondary | ICD-10-CM | POA: Diagnosis not present

## 2021-06-02 DIAGNOSIS — I6389 Other cerebral infarction: Secondary | ICD-10-CM | POA: Diagnosis not present

## 2021-06-02 DIAGNOSIS — R251 Tremor, unspecified: Secondary | ICD-10-CM | POA: Diagnosis not present

## 2021-06-02 DIAGNOSIS — R278 Other lack of coordination: Secondary | ICD-10-CM | POA: Diagnosis not present

## 2021-06-02 DIAGNOSIS — I6522 Occlusion and stenosis of left carotid artery: Secondary | ICD-10-CM | POA: Diagnosis not present

## 2021-06-02 DIAGNOSIS — I5189 Other ill-defined heart diseases: Secondary | ICD-10-CM | POA: Diagnosis not present

## 2021-06-02 DIAGNOSIS — R29705 NIHSS score 5: Secondary | ICD-10-CM | POA: Diagnosis not present

## 2021-06-02 DIAGNOSIS — R5381 Other malaise: Secondary | ICD-10-CM | POA: Diagnosis not present

## 2021-06-02 DIAGNOSIS — I639 Cerebral infarction, unspecified: Secondary | ICD-10-CM | POA: Diagnosis not present

## 2021-06-02 DIAGNOSIS — N390 Urinary tract infection, site not specified: Secondary | ICD-10-CM | POA: Diagnosis not present

## 2021-06-02 DIAGNOSIS — Z66 Do not resuscitate: Secondary | ICD-10-CM | POA: Diagnosis not present

## 2021-06-02 NOTE — ED Notes (Signed)
Patient is being discharged from the Urgent Care and sent to the Emergency Department via POV . Per [place], patient is in need of higher level of care due to complaint, unilateral weakness . Patient is aware and verbalizes understanding of plan of care.  Vitals:   06/02/21 0827 06/02/21 0833  BP: (!) 156/61 (!) 141/60  Pulse: (!) 58   Temp: 97.8 F (36.6 C)   SpO2: 100%

## 2021-06-02 NOTE — ED Triage Notes (Addendum)
Weakness all over  -- started yesterday @ about 8:30am. No facial numbness.  Dragging speech.  Denies dizziness.  No vision changes.  No falls.

## 2021-06-02 NOTE — ED Provider Notes (Signed)
[facility]    CSN: [id number] Arrival date & time: 06/02/21  0818      History   Chief Complaint Chief Complaint  Patient presents with   Weakness    HPI [name] is a 88 y.o. female.   Pleasant 89 year old female presents today with a 24-hour history of generalized weakness.  She states last Thursday morning she got up out of her arm chair to get ready for church, and sat back down because she just felt "bad".  She reports this bad feeling was more so of a generalized weakness.  She denies any pain.  She denies lightheadedness, shortness of breath, palpitations or chest pain.  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  She denies dizziness, headache, slurred speech, change in vision, gait disturbance, unilateral weakness, tingling in the face.  Niece states her voice sounds "tired" but denies additional changes. Patient states that her last meal was on Saturday evening.  She reports yesterday only eating toast and a drink of water.  She has not eaten anything today.   Weakness Associated symptoms: no dizziness and no headaches    Past Medical History:  Diagnosis Date   Anxiety    Arthritis    "may have a touch; all over I guess" (10/11/2017)   Back pain    "all my back" (10/11/2017)   Basal cell carcinoma    "have had many taken off; burned" (10/11/2017)   Chronic orthostatic hypotension 06/2010   with pprior syncopal event,  did not tolelrate florinef trial   Complication of anesthesia    2019 ws very confused for several days after anesthesia   Constipation 08/12/2013   Depression    Hyperlipidemia    Hypertension    Tibia/fibula fracture 10/09/2017   Urinary frequency     Patient Active Problem List   Diagnosis Date Noted   Edema of both lower extremities 09/16/2019   Hair changes 01/10/2019   Abdominal swelling, left lower quadrant 11/15/2018   Cervical radiculopathy due to degenerative joint disease of spine 04/07/2018    CVD (cerebrovascular disease) 04/07/2018   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   Osteoporosis 03/13/2015   Raynaud phenomenon 02/24/2015   Hyponatremia 02/09/2015   Medicare annual wellness visit, subsequent 01/06/2015   Encounter for preventive health examination 01/06/2015   Underweight 06/15/2014   Chronic left hip pain 06/13/2014   Benign essential tremor 10/02/2013   Constipation 08/12/2013   Urinary incontinence, urge 05/29/2012   Screening for breast cancer 10/27/2011   Degeneration of lumbar or lumbosacral intervertebral disc 09/01/2011   Insomnia 01/07/2011   Hyperlipidemia LDL goal <130 11/08/2010   Back pain, sacroiliac 11/08/2010   Anxiety    Essential hypertension 09/18/2010    Past Surgical History:  Procedure Laterality Date   ANKLE FRACTURE SURGERY Left 1998   CATARACT EXTRACTION W/ INTRAOCULAR LENS  IMPLANT, BILATERAL Bilateral    DILATION AND CURETTAGE OF UTERUS     FRACTURE SURGERY     IM NAILING TIBIA Left 10/10/2017   LAPAROSCOPIC BILATERAL SALPINGO OOPHERECTOMY Bilateral 09/02/2020   Procedure: LAPAROSCOPIC BILATERAL SALPINGO OOPHORECTOMY;  Surgeon: Schermerhorn, [name], MD;  Location: [facility];  Service: Gynecology;  Laterality: Bilateral;   TIBIA IM NAIL INSERTION Left 10/10/2017   Procedure: INTRAMEDULLARY (IM) NAIL TIBIAL;  Surgeon: Leandrew Koyanagi, MD;  Location: [facility];  Service: Orthopedics;  Laterality: Left;   WRIST FRACTURE SURGERY Right 2005    OB  History   No obstetric history on file.      Home Medications    Prior to Admission medications   Medication Sig Start Date End Date Taking? Authorizing Provider  amLODipine (NORVASC) 5 MG tablet TAKE ONE TABLET BY MOUTH ONCE DAILY 01/27/21  Yes Crecencio Mc, MD  Ascorbic Acid (VITAMIN C) 1000 MG tablet Take 1,000 mg by mouth every other day.   Yes [provider]  Calcium Carb-Cholecalciferol (CALCIUM 600/VITAMIN D3 PO) Take 1 tablet by mouth daily.   Yes [provider]  diazepam (VALIUM) 5 MG tablet TAKE ONE TABLET BY MOUTH ONCE DAILY AS NEEDED FOR ANXIETY OR FOR MUSCLE SPASMS 03/08/21  Yes Crecencio Mc, MD  diphenhydramine-acetaminophen (TYLENOL PM) 25-500 MG TABS tablet Take 1 tablet by mouth at bedtime.   Yes [provider]  FIBER PO Take 1 capsule by mouth in the morning, at noon, and at bedtime.   Yes [provider]  Garlic Oil 7893 MG CAPS Take 1,000 mg by mouth daily.   Yes [provider]  Lutein 20 MG CAPS Take 20 mg by mouth daily.     Yes [provider]  propranolol ER (INDERAL LA) 60 MG 24 hr capsule TAKE 1 CAPSULE BY MOUTH EVERY DAY 03/05/21  Yes Crecencio Mc, MD  Propylene Glycol (SYSTANE BALANCE OP) Place 1 drop into both eyes 4 (four) times daily as needed (dry eyes).   Yes [provider]  sennosides-docusate sodium (SENOKOT-S) 8.6-50 MG tablet Take 1 tablet by mouth daily as needed for constipation. 10/14/17  Yes [provider]  vitamin E 180 MG (400 UNITS) capsule Take 400 Units by mouth daily.    Yes [provider]    Family History Family History  Problem Relation Age of Onset   Colon cancer Mother    Heart disease Father    Kidney disease Father    Cancer Neg Hx    Diabetes Neg Hx     Social History Social History   Tobacco Use   Smoking status: Never   Smokeless tobacco: Never  Vaping Use   Vaping Use: Never used  Substance Use Topics   Alcohol use: Never   Drug use: Never     Allergies   Patient has no known allergies.   Review of Systems Review of Systems  Constitutional:  Positive for activity change.  Neurological:  Positive for weakness. Negative for dizziness, syncope, light-headedness, numbness and headaches.    Physical Exam Triage Vital Signs ED Triage Vitals  Enc Vitals Group     BP 06/02/21 0827 (!) 156/61     Pulse Rate 06/02/21 0827 (!) 58     Resp --      Temp 06/02/21 0827 97.8 F (36.6 C)     Temp Source  06/02/21 0827 Oral     SpO2 06/02/21 0827 100 %     Weight 06/02/21 0829 130 lb (59 kg)     Height 06/02/21 0829 '5\' 6"'$  (1.676 m)     Head Circumference --      Peak Flow --      Pain Score 06/02/21 0829 0     Pain Loc --      Pain Edu? --      Excl. in [place]? --    No data found.  Updated Vital Signs BP (!) 141/60 (BP Location: Right Arm)   Pulse (!) 58   Temp 97.8 F (36.6 C) (Oral)   Ht  $'5\' 6"'G$  (1.676 m)   Wt 130 lb (59 kg)   SpO2 100%   BMI 20.98 kg/m   Visual Acuity Right Eye Distance:   Left Eye Distance:   Bilateral Distance:    Right Eye Near:   Left Eye Near:    Bilateral Near:     Physical Exam Vitals and nursing note reviewed. Exam conducted with a chaperone present.  Constitutional:      General: She is not in acute distress.    Appearance: Normal appearance. She is well-developed. She is not ill-appearing or toxic-appearing.     Comments: Pt resting comfortably in wheelchair; thin appearing  HENT:     Head: Normocephalic and atraumatic.     Right Ear: Tympanic membrane, ear canal and external ear normal.     Left Ear: Tympanic membrane, ear canal and external ear normal.     Nose: Nose normal.     Mouth/Throat:     Mouth: Mucous membranes are moist.     Pharynx: No oropharyngeal exudate or posterior oropharyngeal erythema.  Eyes:     Extraocular Movements: Extraocular movements intact.     Conjunctiva/sclera: Conjunctivae normal.     Pupils: Pupils are equal, round, and reactive to light.  Cardiovascular:     Rate and Rhythm: Normal rate and regular rhythm.     Heart sounds: No murmur heard. Pulmonary:     Effort: Pulmonary effort is normal. No respiratory distress.     Breath sounds: Normal breath sounds.  Abdominal:     Palpations: Abdomen is soft.     Tenderness: There is no abdominal tenderness.  Musculoskeletal:        General: No swelling.     Cervical back: Normal range of motion and neck supple. No rigidity or tenderness.  Lymphadenopathy:      Cervical: No cervical adenopathy.  Skin:    General: Skin is warm and dry.     Capillary Refill: Capillary refill takes less than 2 seconds.  Neurological:     General: No focal deficit present.     Mental Status: She is alert and oriented to person, place, and time.     Cranial Nerves: Cranial nerve deficit (pt did have slightly diminished smile on the R. Puffing cheeks and raising eyebrows normal) present. No facial asymmetry.     Sensory: Sensation is intact. No sensory deficit.     Motor: Weakness (grip strength to R hand mildly decreased) present. No tremor, atrophy or pronator drift.     Coordination: Coordination is intact. Romberg sign negative. Coordination normal. Finger-Nose-Finger Test normal.     Gait: Gait is intact.     Deep Tendon Reflexes: Reflexes are normal and symmetric.  Psychiatric:        Mood and Affect: Mood normal.     UC Treatments / Results  Labs (all labs ordered are listed, but only abnormal results are displayed) Labs Reviewed - No data to display  EKG   Radiology No results found.  Procedures Procedures (including critical care time)  Medications Ordered in UC Medications - No data to display  Initial Impression / Assessment and Plan / UC Course  I have reviewed the triage vital signs and the nursing notes.  Pertinent labs & imaging results that were available during my care of the patient were reviewed by me and considered in my medical decision making (see chart for details).     Weakness - likely multifactorial including nothing to eat since Saturday. More concerning is  her generalized weakness in the setting of a prior retinal artery stroke on R in 2018. She is VAN negative and symptoms started >24 hours ago, however there were some cranial nerve deficits that require ER workup. EMS transport offered, pt stated "I'm fine" and requested personal vehicle transport. Pt will seek ER care for stroke / generalized weakness workup  Final  Clinical Impressions(s) / UC Diagnoses   Final diagnoses:  Weakness     Discharge Instructions      Due to your acute symptoms, it is necessary to have a further workup in the ER. Symptom onset >24 hours ago. EMS transport was offered, you have chosen transport via personal vehicle. Please go to the nearest ER for further workup.   ED Prescriptions   None    PDMP not reviewed this encounter.   Chaney Malling, [place] 06/02/21 [phone]

## 2021-06-02 NOTE — Discharge Instructions (Signed)
Due to your acute symptoms, it is necessary to have a further workup in the ER. Symptom onset >24 hours ago. EMS transport was offered, you have chosen transport via personal vehicle. Please go to the nearest ER for further workup.

## 2021-06-03 DIAGNOSIS — R29810 Facial weakness: Secondary | ICD-10-CM | POA: Diagnosis not present

## 2021-06-03 DIAGNOSIS — R471 Dysarthria and anarthria: Secondary | ICD-10-CM | POA: Diagnosis not present

## 2021-06-05 ENCOUNTER — Encounter: Payer: Self-pay | Admitting: Internal Medicine

## 2021-06-05 ENCOUNTER — Telehealth: Payer: Self-pay | Admitting: Internal Medicine

## 2021-06-05 DIAGNOSIS — I6522 Occlusion and stenosis of left carotid artery: Secondary | ICD-10-CM | POA: Insufficient documentation

## 2021-06-05 DIAGNOSIS — Z8673 Personal history of transient ischemic attack (TIA), and cerebral infarction without residual deficits: Secondary | ICD-10-CM | POA: Insufficient documentation

## 2021-06-05 NOTE — Telephone Encounter (Signed)
Pt called stating if she could get a referral for skilled nursing and pt has an follow-up appointment tomorrow from a stroke at 1pm

## 2021-06-05 NOTE — Telephone Encounter (Signed)
Pt was advised that we would discuss this with her during her visit tomorrow. Pt gave a verbal understanding. Pt was see at [facility] ED yesterday for generalized weakness that ended up being a CVA. Pt has been discharged to home. Pt stated that the stroke has caused her reactions to slow down and has affected her speech otherwise is doing good and "can't complain".

## 2021-06-06 ENCOUNTER — Encounter: Payer: Self-pay | Admitting: Internal Medicine

## 2021-06-06 ENCOUNTER — Ambulatory Visit (INDEPENDENT_AMBULATORY_CARE_PROVIDER_SITE_OTHER): Payer: Medicare Other | Admitting: Internal Medicine

## 2021-06-06 DIAGNOSIS — R140 Abdominal distension (gaseous): Secondary | ICD-10-CM | POA: Diagnosis not present

## 2021-06-06 DIAGNOSIS — R531 Weakness: Secondary | ICD-10-CM | POA: Diagnosis not present

## 2021-06-06 DIAGNOSIS — E785 Hyperlipidemia, unspecified: Secondary | ICD-10-CM | POA: Diagnosis not present

## 2021-06-06 DIAGNOSIS — R2681 Unsteadiness on feet: Secondary | ICD-10-CM | POA: Diagnosis not present

## 2021-06-06 DIAGNOSIS — Z66 Do not resuscitate: Secondary | ICD-10-CM | POA: Diagnosis not present

## 2021-06-06 DIAGNOSIS — E871 Hypo-osmolality and hyponatremia: Secondary | ICD-10-CM | POA: Diagnosis not present

## 2021-06-06 DIAGNOSIS — Z9181 History of falling: Secondary | ICD-10-CM | POA: Diagnosis not present

## 2021-06-06 DIAGNOSIS — I700 Atherosclerosis of aorta: Secondary | ICD-10-CM | POA: Diagnosis not present

## 2021-06-06 DIAGNOSIS — Z79899 Other long term (current) drug therapy: Secondary | ICD-10-CM | POA: Diagnosis not present

## 2021-06-06 DIAGNOSIS — Z7409 Other reduced mobility: Secondary | ICD-10-CM | POA: Diagnosis not present

## 2021-06-06 DIAGNOSIS — R109 Unspecified abdominal pain: Secondary | ICD-10-CM | POA: Diagnosis not present

## 2021-06-06 DIAGNOSIS — R10817 Generalized abdominal tenderness: Secondary | ICD-10-CM | POA: Diagnosis not present

## 2021-06-06 DIAGNOSIS — I69359 Hemiplegia and hemiparesis following cerebral infarction affecting unspecified side: Secondary | ICD-10-CM | POA: Diagnosis not present

## 2021-06-06 DIAGNOSIS — R748 Abnormal levels of other serum enzymes: Secondary | ICD-10-CM | POA: Diagnosis not present

## 2021-06-06 DIAGNOSIS — Z8673 Personal history of transient ischemic attack (TIA), and cerebral infarction without residual deficits: Secondary | ICD-10-CM | POA: Diagnosis not present

## 2021-06-06 DIAGNOSIS — I6529 Occlusion and stenosis of unspecified carotid artery: Secondary | ICD-10-CM | POA: Diagnosis not present

## 2021-06-06 DIAGNOSIS — I10 Essential (primary) hypertension: Secondary | ICD-10-CM | POA: Diagnosis not present

## 2021-06-06 DIAGNOSIS — I69391 Dysphagia following cerebral infarction: Secondary | ICD-10-CM | POA: Diagnosis not present

## 2021-06-06 DIAGNOSIS — R2689 Other abnormalities of gait and mobility: Secondary | ICD-10-CM | POA: Diagnosis not present

## 2021-06-06 DIAGNOSIS — H34231 Retinal artery branch occlusion, right eye: Secondary | ICD-10-CM | POA: Diagnosis not present

## 2021-06-06 NOTE — Assessment & Plan Note (Signed)
Her deficits have worsened since discharge, unclear if due to statin myopathy (which she believes is why she stopped atorvastatin in 2018),  Vs propogation of left pontine CVA.  Regardess of the etiology, she is unsafe to return home and requires skilled nursing .  Advising niece to return to ER for admission and placement

## 2021-06-06 NOTE — Progress Notes (Signed)
Subjective:  Patient ID: [name], female    DOB: September 04, 1926  Age: 88 y.o. 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   HPI [name] presents for hospital follow up.  [name] is a 88 yr old female with a history of  a branch retinal artery occlusion in 2018,  previously managed with asa and pravastatin  until 2019 (stopped at patient request) who presented to [facility] care  on May 29 with generalized weakness,  and neurologic changes concerning for CVA .  Symptoms were noted on May May 28 am    During workup for CVA she was noted to have a left pontine CVA and 70% stenosis of the right ICA. (See workup below)   Neurologic symptoms /signs included a left visual field cut on the  right eye, mild right facial droop,  and mild dysarthria.  She was also treated for  an E Coli UTI with IV meds.   MRI brain with and without contrast showed acute tiny left/central pons infarct - MRA of the neck showed focal stenosis of the distal left common carotid artery with approximately 70% narrowing and hypoplastic right vertebral.  - EKG showed NSR and possible Left Atrial enlargement -TTE with poor windows, showed LVEF 55 to 60%, no vegetations, no shunt noted -Hemoglobin A1c 5.1, LDL 172 - CT head noncon on 5/30 for waxing/waning symptoms, unchanged from prior  She was referred for PT/OT 3 times weekly and discharged on Wednesday morning May 30,  with new meds :N asa, atorvastatin 20 mg  plavix for 18 days and septra Ds x 3 days. .  She was also referred to outpatient Gen Surg at [facility] for continued concerns of abdominal mass (which has been worked up repeatedly by me , by [facility] and by general surgery  and GYN with no mass or surgical indication found )    She is here today accompanied by her niece Joseph Art ,  Who has been splitting supervision of patient with her other niece Dawn ,since  discharge.  Patient has become progressively weaker since discharge on May 30.  She cannot rise from a chair unassisted,  get in or out of bed without assistance,  and this morning was unable to roll over in bed.   She has developed intermittent dysphagia for solids and liquids.  Her sppech has become more dysarthric per niece. She is alert and responsive and oriented.      Outpatient Medications Prior to Visit  Medication Sig Dispense Refill   amLODipine (NORVASC) 5 MG tablet TAKE ONE TABLET BY MOUTH ONCE DAILY 90 tablet 1   Ascorbic Acid (VITAMIN C) 1000 MG tablet Take 1,000 mg by mouth every other day.     Calcium Carb-Cholecalciferol (CALCIUM 600/VITAMIN D3 PO) Take 1 tablet by mouth daily.     diazepam (VALIUM) 5 MG tablet TAKE ONE TABLET BY MOUTH ONCE DAILY AS NEEDED FOR ANXIETY OR FOR MUSCLE SPASMS 30 tablet 2   diphenhydramine-acetaminophen (TYLENOL PM) 25-500 MG TABS tablet Take 1 tablet by mouth at bedtime.     FIBER PO Take 1 capsule by mouth in the morning, at noon, and at bedtime.     Garlic Oil 2355 MG CAPS Take 1,000 mg by mouth daily.     Lutein 20 MG CAPS Take 20 mg by mouth daily.       propranolol ER (INDERAL  LA) 60 MG 24 hr capsule TAKE 1 CAPSULE BY MOUTH EVERY DAY 90 capsule 1   Propylene Glycol (SYSTANE BALANCE OP) Place 1 drop into both eyes 4 (four) times daily as needed (dry eyes).     sennosides-docusate sodium (SENOKOT-S) 8.6-50 MG tablet Take 1 tablet by mouth daily as needed for constipation.     vitamin E 180 MG (400 UNITS) capsule Take 400 Units by mouth daily.      No facility-administered medications prior to visit.    Review of Systems;  Patient denies headache, fevers, malaise, unintentional weight loss, skin rash, eye pain, sinus congestion and sinus pain, sore throat, dysphagia,  hemoptysis , cough, dyspnea, wheezing, chest pain, palpitations, orthopnea, edema, abdominal pain, nausea, melena, diarrhea, constipation, flank pain, dysuria, hematuria, urinary   Frequency, nocturia, numbness, tingling, seizures,  Focal weakness, Loss of consciousness,  Tremor, insomnia, depression, anxiety, and suicidal ideation.      Objective:  BP 128/78 (BP Location: Right Arm, Patient Position: Sitting, Cuff Size: Small)   Pulse 62   Temp (!) 97.3 F (36.3 C) (Oral)   Ht '5\' 6"'$  (1.676 m)   Wt 128 lb 6.4 oz (58.2 kg)   SpO2 96%   BMI 20.72 kg/m   BP Readings from Last 3 Encounters:  06/06/21 128/78  06/02/21 (!) 141/60  04/22/21 (!) 162/76    Wt Readings from Last 3 Encounters:  06/06/21 128 lb 6.4 oz (58.2 kg)  06/02/21 130 lb (59 kg)  05/29/21 131 lb (59.4 kg)    General appearance: alert, cooperative and appears stated age Ears: normal TM's and external ear canals both ears Throat: lips, mucosa, and tongue normal; teeth and gums normal Neck: no adenopathy, no carotid bruit, supple, symmetrical, trachea midline and thyroid not enlarged, symmetric, no tenderness/mass/nodules Back: symmetric, kyphotic . ROM normal. No CVA tenderness. Lungs: clear to auscultation bilaterally Heart: regular rate and rhythm, S1, S2 normal, no murmur, click, rub or gallop Abdomen: soft, non-tender; bowel sounds normal; no masses,  no organomegaly Pulses: 2+ and symmetric Skin: Skin color, texture, turgor normal. No rashes or lesions Lymph nodes: Cervical, supraclavicular, and axillary nodes normal. Neuro:  speech dysarthric.  Upper extremity strength 4+/5 on right,  5/5 on left.   5/5 distally,  3/5 proximally   No results found for: HGBA1C  Lab Results  Component Value Date   CREATININE 0.70 10/16/2020   CREATININE 0.86 08/29/2020   CREATININE 0.83 07/26/2020    Lab Results  Component Value Date   WBC 9.2 08/29/2020   HGB 12.7 08/29/2020   HCT 36.9 08/29/2020   PLT 304 08/29/2020   GLUCOSE 100 (H) 08/29/2020   CHOL 254 (H) 06/09/2016   TRIG 103.0 06/09/2016   HDL 60.00 06/09/2016   LDLDIRECT 145.6 12/21/2012   LDLCALC 174 (H) 06/09/2016   ALT 9  06/02/2019   AST 17 06/02/2019   NA 134 (L) 08/29/2020   K 4.6 08/29/2020   CL 100 08/29/2020   CREATININE 0.70 10/16/2020   BUN 16 08/29/2020   CO2 29 08/29/2020   TSH 1.38 09/14/2019   INR 0.99 11/30/2016    No results found.  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  MRI was negative for acute CVAs but an old right pontine CVA was noted.  At her visit with me in Nov 2018.  Asa and atorvastatin strongly recommended and  prescribed, but she did not tolerate it .[name]  rx was changed to pravastatin which she tolerated but stopped sometime  in 2019 at which time she was lost to follow up as she had changed providers.        History of CVA (cerebrovascular accident)    Her deficits have worsened since discharge, unclear if due to statin myopathy (which she believes is why she stopped atorvastatin in 2018),  Vs propogation of left pontine CVA.  Regardess of the etiology, she is unsafe to return home and requires skilled nursing .  Advising niece to return to ER for admission and placement         I spent a total of  40 minutes with this patient in a face to face visit on the date of this encounter reviewing her recent hospitalization at [facility], including all imaging studies  and post visit ordering of testing and therapeutics.    Follow-up: No follow-ups on file.   Crecencio Mc, MD

## 2021-06-06 NOTE — Assessment & Plan Note (Addendum)
Occurred in Nov 2018.  MRI was negative for acute CVAs but an old right pontine CVA was noted.  At her visit with me in Nov 2018.  Asa and atorvastatin strongly recommended and prescribed, but she did not tolerate it .[name]  rx was changed to pravastatin which she tolerated but stopped sometime  in 2019 at which time she was lost to follow up as she had changed providers.

## 2021-06-09 ENCOUNTER — Telehealth: Payer: Self-pay | Admitting: Internal Medicine

## 2021-06-09 NOTE — Telephone Encounter (Signed)
Renee (Family Member) called in requesting for callback... Renee stated that Dr. Derrel Nip advised pt that she can put in a referral for pt to go to rehab... Renee stated that Dr. Derrel Nip advised them that she think that pt should go to ED to get referred to rehab faster... Renee stated that pt is in the hospital right now and they are trying to send pt to rehab in [place]... Renee stated that she doesn't think that's a great ideal for pt to be so far away from family... Renee stated that she had spoke with case manager and another person and they had advised her that she can switch the rehab place if needed... Renee stated that she advised the hospital that she would like to change rehab facility and now they are telling her no because no other rehab facility callback to accept pt... Joseph Art was wondering if its ok for pt to discharge home and will Dr. Derrel Nip will send the referral over to [facility]... Joseph Art is requesting callback

## 2021-06-09 NOTE — Telephone Encounter (Signed)
Renee called back requesting callback on this number [phone]

## 2021-06-10 DIAGNOSIS — I6930 Unspecified sequelae of cerebral infarction: Secondary | ICD-10-CM | POA: Diagnosis not present

## 2021-06-10 DIAGNOSIS — I69359 Hemiplegia and hemiparesis following cerebral infarction affecting unspecified side: Secondary | ICD-10-CM | POA: Diagnosis not present

## 2021-06-10 DIAGNOSIS — E785 Hyperlipidemia, unspecified: Secondary | ICD-10-CM | POA: Diagnosis not present

## 2021-06-10 DIAGNOSIS — I6529 Occlusion and stenosis of unspecified carotid artery: Secondary | ICD-10-CM | POA: Diagnosis not present

## 2021-06-10 DIAGNOSIS — R2689 Other abnormalities of gait and mobility: Secondary | ICD-10-CM | POA: Diagnosis not present

## 2021-06-10 DIAGNOSIS — Z66 Do not resuscitate: Secondary | ICD-10-CM | POA: Diagnosis not present

## 2021-06-10 DIAGNOSIS — Z7409 Other reduced mobility: Secondary | ICD-10-CM | POA: Diagnosis not present

## 2021-06-10 DIAGNOSIS — Z7902 Long term (current) use of antithrombotics/antiplatelets: Secondary | ICD-10-CM | POA: Diagnosis not present

## 2021-06-10 DIAGNOSIS — I69891 Dysphagia following other cerebrovascular disease: Secondary | ICD-10-CM | POA: Diagnosis not present

## 2021-06-10 DIAGNOSIS — M6281 Muscle weakness (generalized): Secondary | ICD-10-CM | POA: Diagnosis not present

## 2021-06-10 DIAGNOSIS — I69391 Dysphagia following cerebral infarction: Secondary | ICD-10-CM | POA: Diagnosis not present

## 2021-06-10 DIAGNOSIS — Z79899 Other long term (current) drug therapy: Secondary | ICD-10-CM | POA: Diagnosis not present

## 2021-06-10 DIAGNOSIS — Z9181 History of falling: Secondary | ICD-10-CM | POA: Diagnosis not present

## 2021-06-10 DIAGNOSIS — M1991 Primary osteoarthritis, unspecified site: Secondary | ICD-10-CM | POA: Diagnosis not present

## 2021-06-10 DIAGNOSIS — H04123 Dry eye syndrome of bilateral lacrimal glands: Secondary | ICD-10-CM | POA: Diagnosis not present

## 2021-06-10 DIAGNOSIS — R279 Unspecified lack of coordination: Secondary | ICD-10-CM | POA: Diagnosis not present

## 2021-06-10 DIAGNOSIS — I472 Ventricular tachycardia, unspecified: Secondary | ICD-10-CM | POA: Diagnosis not present

## 2021-06-10 DIAGNOSIS — I69822 Dysarthria following other cerebrovascular disease: Secondary | ICD-10-CM | POA: Diagnosis not present

## 2021-06-10 DIAGNOSIS — E871 Hypo-osmolality and hyponatremia: Secondary | ICD-10-CM | POA: Diagnosis not present

## 2021-06-10 DIAGNOSIS — I471 Supraventricular tachycardia: Secondary | ICD-10-CM | POA: Diagnosis not present

## 2021-06-10 DIAGNOSIS — R10817 Generalized abdominal tenderness: Secondary | ICD-10-CM | POA: Diagnosis not present

## 2021-06-10 DIAGNOSIS — R748 Abnormal levels of other serum enzymes: Secondary | ICD-10-CM | POA: Diagnosis not present

## 2021-06-10 DIAGNOSIS — K5904 Chronic idiopathic constipation: Secondary | ICD-10-CM | POA: Diagnosis not present

## 2021-06-10 DIAGNOSIS — R5381 Other malaise: Secondary | ICD-10-CM | POA: Diagnosis not present

## 2021-06-10 DIAGNOSIS — I700 Atherosclerosis of aorta: Secondary | ICD-10-CM | POA: Diagnosis not present

## 2021-06-10 DIAGNOSIS — I69398 Other sequelae of cerebral infarction: Secondary | ICD-10-CM | POA: Diagnosis not present

## 2021-06-10 DIAGNOSIS — H269 Unspecified cataract: Secondary | ICD-10-CM | POA: Diagnosis not present

## 2021-06-10 DIAGNOSIS — R109 Unspecified abdominal pain: Secondary | ICD-10-CM | POA: Diagnosis not present

## 2021-06-10 DIAGNOSIS — R1319 Other dysphagia: Secondary | ICD-10-CM | POA: Diagnosis not present

## 2021-06-10 DIAGNOSIS — I69861 Other paralytic syndrome following other cerebrovascular disease affecting right dominant side: Secondary | ICD-10-CM | POA: Diagnosis not present

## 2021-06-10 DIAGNOSIS — H34231 Retinal artery branch occlusion, right eye: Secondary | ICD-10-CM | POA: Diagnosis not present

## 2021-06-10 DIAGNOSIS — I252 Old myocardial infarction: Secondary | ICD-10-CM | POA: Diagnosis not present

## 2021-06-10 DIAGNOSIS — M1990 Unspecified osteoarthritis, unspecified site: Secondary | ICD-10-CM | POA: Diagnosis not present

## 2021-06-10 DIAGNOSIS — R140 Abdominal distension (gaseous): Secondary | ICD-10-CM | POA: Diagnosis not present

## 2021-06-10 DIAGNOSIS — R531 Weakness: Secondary | ICD-10-CM | POA: Diagnosis not present

## 2021-06-10 DIAGNOSIS — I10 Essential (primary) hypertension: Secondary | ICD-10-CM | POA: Diagnosis not present

## 2021-06-10 DIAGNOSIS — R1311 Dysphagia, oral phase: Secondary | ICD-10-CM | POA: Diagnosis not present

## 2021-06-10 DIAGNOSIS — I6350 Cerebral infarction due to unspecified occlusion or stenosis of unspecified cerebral artery: Secondary | ICD-10-CM | POA: Diagnosis not present

## 2021-06-10 DIAGNOSIS — E782 Mixed hyperlipidemia: Secondary | ICD-10-CM | POA: Diagnosis not present

## 2021-06-10 DIAGNOSIS — I69322 Dysarthria following cerebral infarction: Secondary | ICD-10-CM | POA: Diagnosis not present

## 2021-06-10 DIAGNOSIS — I679 Cerebrovascular disease, unspecified: Secondary | ICD-10-CM | POA: Diagnosis not present

## 2021-06-10 DIAGNOSIS — Z7982 Long term (current) use of aspirin: Secondary | ICD-10-CM | POA: Diagnosis not present

## 2021-06-10 NOTE — Telephone Encounter (Signed)
Spoke with pt's niece and she stated that they were able to get pt placed at [facility] for 17 days, then she will be reassessed.

## 2021-06-13 DIAGNOSIS — R1319 Other dysphagia: Secondary | ICD-10-CM | POA: Diagnosis not present

## 2021-06-13 DIAGNOSIS — M6281 Muscle weakness (generalized): Secondary | ICD-10-CM | POA: Diagnosis not present

## 2021-06-13 DIAGNOSIS — E782 Mixed hyperlipidemia: Secondary | ICD-10-CM | POA: Diagnosis not present

## 2021-06-13 DIAGNOSIS — I10 Essential (primary) hypertension: Secondary | ICD-10-CM | POA: Diagnosis not present

## 2021-06-13 DIAGNOSIS — H04123 Dry eye syndrome of bilateral lacrimal glands: Secondary | ICD-10-CM | POA: Diagnosis not present

## 2021-06-13 DIAGNOSIS — K5904 Chronic idiopathic constipation: Secondary | ICD-10-CM | POA: Diagnosis not present

## 2021-06-13 DIAGNOSIS — I6930 Unspecified sequelae of cerebral infarction: Secondary | ICD-10-CM | POA: Diagnosis not present

## 2021-06-16 DIAGNOSIS — I6930 Unspecified sequelae of cerebral infarction: Secondary | ICD-10-CM | POA: Diagnosis not present

## 2021-06-16 DIAGNOSIS — R1319 Other dysphagia: Secondary | ICD-10-CM | POA: Diagnosis not present

## 2021-06-16 DIAGNOSIS — H04123 Dry eye syndrome of bilateral lacrimal glands: Secondary | ICD-10-CM | POA: Diagnosis not present

## 2021-06-16 DIAGNOSIS — E782 Mixed hyperlipidemia: Secondary | ICD-10-CM | POA: Diagnosis not present

## 2021-06-16 DIAGNOSIS — I10 Essential (primary) hypertension: Secondary | ICD-10-CM | POA: Diagnosis not present

## 2021-06-16 DIAGNOSIS — M6281 Muscle weakness (generalized): Secondary | ICD-10-CM | POA: Diagnosis not present

## 2021-06-16 DIAGNOSIS — K5904 Chronic idiopathic constipation: Secondary | ICD-10-CM | POA: Diagnosis not present

## 2021-06-17 DIAGNOSIS — K5904 Chronic idiopathic constipation: Secondary | ICD-10-CM | POA: Diagnosis not present

## 2021-06-17 DIAGNOSIS — M6281 Muscle weakness (generalized): Secondary | ICD-10-CM | POA: Diagnosis not present

## 2021-06-17 DIAGNOSIS — E782 Mixed hyperlipidemia: Secondary | ICD-10-CM | POA: Diagnosis not present

## 2021-06-17 DIAGNOSIS — I69861 Other paralytic syndrome following other cerebrovascular disease affecting right dominant side: Secondary | ICD-10-CM | POA: Diagnosis not present

## 2021-06-17 DIAGNOSIS — I252 Old myocardial infarction: Secondary | ICD-10-CM | POA: Diagnosis not present

## 2021-06-17 DIAGNOSIS — I69891 Dysphagia following other cerebrovascular disease: Secondary | ICD-10-CM | POA: Diagnosis not present

## 2021-06-17 DIAGNOSIS — H04123 Dry eye syndrome of bilateral lacrimal glands: Secondary | ICD-10-CM | POA: Diagnosis not present

## 2021-06-17 DIAGNOSIS — M1991 Primary osteoarthritis, unspecified site: Secondary | ICD-10-CM | POA: Diagnosis not present

## 2021-06-17 DIAGNOSIS — I10 Essential (primary) hypertension: Secondary | ICD-10-CM | POA: Diagnosis not present

## 2021-06-17 DIAGNOSIS — I69822 Dysarthria following other cerebrovascular disease: Secondary | ICD-10-CM | POA: Diagnosis not present

## 2021-06-19 DIAGNOSIS — I471 Supraventricular tachycardia: Secondary | ICD-10-CM | POA: Diagnosis not present

## 2021-06-19 DIAGNOSIS — I472 Ventricular tachycardia, unspecified: Secondary | ICD-10-CM | POA: Diagnosis not present

## 2021-06-19 DIAGNOSIS — I6350 Cerebral infarction due to unspecified occlusion or stenosis of unspecified cerebral artery: Secondary | ICD-10-CM | POA: Diagnosis not present

## 2021-06-19 DIAGNOSIS — I679 Cerebrovascular disease, unspecified: Secondary | ICD-10-CM | POA: Diagnosis not present

## 2021-06-20 DIAGNOSIS — K5904 Chronic idiopathic constipation: Secondary | ICD-10-CM | POA: Diagnosis not present

## 2021-06-20 DIAGNOSIS — H04123 Dry eye syndrome of bilateral lacrimal glands: Secondary | ICD-10-CM | POA: Diagnosis not present

## 2021-06-20 DIAGNOSIS — M6281 Muscle weakness (generalized): Secondary | ICD-10-CM | POA: Diagnosis not present

## 2021-06-20 DIAGNOSIS — R1319 Other dysphagia: Secondary | ICD-10-CM | POA: Diagnosis not present

## 2021-06-20 DIAGNOSIS — I6930 Unspecified sequelae of cerebral infarction: Secondary | ICD-10-CM | POA: Diagnosis not present

## 2021-06-23 ENCOUNTER — Telehealth: Payer: Self-pay

## 2021-06-23 DIAGNOSIS — R1319 Other dysphagia: Secondary | ICD-10-CM | POA: Diagnosis not present

## 2021-06-23 DIAGNOSIS — H04123 Dry eye syndrome of bilateral lacrimal glands: Secondary | ICD-10-CM | POA: Diagnosis not present

## 2021-06-23 DIAGNOSIS — I6930 Unspecified sequelae of cerebral infarction: Secondary | ICD-10-CM | POA: Diagnosis not present

## 2021-06-23 DIAGNOSIS — M6281 Muscle weakness (generalized): Secondary | ICD-10-CM | POA: Diagnosis not present

## 2021-06-23 DIAGNOSIS — E782 Mixed hyperlipidemia: Secondary | ICD-10-CM | POA: Diagnosis not present

## 2021-06-23 DIAGNOSIS — K5904 Chronic idiopathic constipation: Secondary | ICD-10-CM | POA: Diagnosis not present

## 2021-06-23 DIAGNOSIS — I10 Essential (primary) hypertension: Secondary | ICD-10-CM | POA: Diagnosis not present

## 2021-06-23 NOTE — Telephone Encounter (Signed)
[name] with [facility] is aware that Dr. Derrel Nip will follow orders for pt.

## 2021-06-23 NOTE — Telephone Encounter (Signed)
[name] called from [facility] to state patient is discharging from [facility] today.  [name] states she has orders for nursing, PT, OT, and [facility].  [name] states she would like to verify that Dr. Deborra Medina will follow for orders.

## 2021-06-25 ENCOUNTER — Telehealth: Payer: Self-pay

## 2021-06-25 ENCOUNTER — Telehealth: Payer: Self-pay | Admitting: Internal Medicine

## 2021-06-25 NOTE — Telephone Encounter (Signed)
Transition Care Management Unsuccessful Follow-up Telephone Call  Date of discharge and from where:  06/23/21  [place]  Attempts:  1st Attempt  Reason for unsuccessful TCM follow-up call:  No answer/busy. Will follow as appropriate.

## 2021-06-25 NOTE — Telephone Encounter (Signed)
FYI

## 2021-06-25 NOTE — Telephone Encounter (Signed)
Dewana from [facility], [phone] Ext # [phone]. Patient has been release from rehab and will start speech therapy on 06/30/2021.

## 2021-06-26 NOTE — Telephone Encounter (Signed)
Transition Care Management Unsuccessful Follow-up Telephone Call  Date of discharge and from where:  06/23/21 [facility]  Attempts:  2nd Attempt  Reason for unsuccessful TCM follow-up call:  Unable to reach patient.

## 2021-06-29 ENCOUNTER — Encounter: Payer: Self-pay | Admitting: Emergency Medicine

## 2021-06-29 ENCOUNTER — Emergency Department
Admission: EM | Admit: 2021-06-29 | Discharge: 2021-06-29 | Disposition: A | Discharge status: Home / Self Care | Payer: Medicare Other | Attending: Student in an Organized Health Care Education/Training Program | Admitting: Student in an Organized Health Care Education/Training Program

## 2021-06-29 ENCOUNTER — Ambulatory Visit
Admission: EM | Admit: 2021-06-29 | Discharge: 2021-06-29 | Disposition: A | Discharge status: Home / Self Care | Payer: Medicare Other

## 2021-06-29 ENCOUNTER — Other Ambulatory Visit: Payer: Self-pay

## 2021-06-29 ENCOUNTER — Emergency Department: Payer: Medicare Other

## 2021-06-29 DIAGNOSIS — K5904 Chronic idiopathic constipation: Secondary | ICD-10-CM | POA: Insufficient documentation

## 2021-06-29 DIAGNOSIS — K5900 Constipation, unspecified: Secondary | ICD-10-CM | POA: Diagnosis not present

## 2021-06-29 DIAGNOSIS — R339 Retention of urine, unspecified: Secondary | ICD-10-CM | POA: Diagnosis not present

## 2021-06-29 DIAGNOSIS — D649 Anemia, unspecified: Secondary | ICD-10-CM | POA: Diagnosis not present

## 2021-06-29 DIAGNOSIS — R001 Bradycardia, unspecified: Secondary | ICD-10-CM | POA: Insufficient documentation

## 2021-06-29 LAB — CBC
HCT: 34.8 % — ABNORMAL LOW (ref 36.0–46.0)
Hemoglobin: 11.5 g/dL — ABNORMAL LOW (ref 12.0–15.0)
MCH: 32.1 pg (ref 26.0–34.0)
MCHC: 33 g/dL (ref 30.0–36.0)
MCV: 97.2 fL (ref 80.0–100.0)
Platelets: 274 10*3/uL (ref 150–400)
RBC: 3.58 MIL/uL — ABNORMAL LOW (ref 3.87–5.11)
RDW: 12.7 % (ref 11.5–15.5)
WBC: 6.8 10*3/uL (ref 4.0–10.5)
nRBC: 0 % (ref 0.0–0.2)

## 2021-06-29 LAB — BASIC METABOLIC PANEL
Anion gap: 5 (ref 5–15)
BUN: 7 mg/dL — ABNORMAL LOW (ref 8–23)
CO2: 28 mmol/L (ref 22–32)
Calcium: 9.3 mg/dL (ref 8.9–10.3)
Chloride: 103 mmol/L (ref 98–111)
Creatinine, Ser: 0.72 mg/dL (ref 0.44–1.00)
GFR, Estimated: >60 mL/min (ref >60)
Glucose, Bld: 98 mg/dL (ref 70–99)
Potassium: 3.9 mmol/L (ref 3.5–5.1)
Sodium: 136 mmol/L (ref 135–145)

## 2021-06-29 LAB — URINALYSIS, ROUTINE W REFLEX MICROSCOPIC
Bilirubin Urine: NEGATIVE
Glucose, UA: NEGATIVE mg/dL
Hgb urine dipstick: NEGATIVE
Ketones, ur: NEGATIVE mg/dL
Nitrite: NEGATIVE
Protein, ur: NEGATIVE mg/dL
Specific Gravity, Urine: 1.004 — ABNORMAL LOW (ref 1.005–1.030)
pH: 7 (ref 5.0–8.0)

## 2021-06-29 MED ORDER — TAMSULOSIN HCL 0.4 MG PO CAPS: 0.4000 mg | ORAL_CAPSULE | Freq: Every day | ORAL | 0 refills | Status: DC

## 2021-06-29 NOTE — ED Triage Notes (Signed)
Pt via POV from home. Pt c/o constipation for the past 2 days. States she has tried OTC medication with no relief. States she also has not peed since midnight last night. Denies any other urinary symptoms. Denies any type of pain. Pt is A&Ox4 and NAD.

## 2021-06-29 NOTE — ED Triage Notes (Signed)
Pt caregiver states pt has not urinated since midnight last night despite drinking plenty of fluids. Care giver states she has some lower abdominal swelling and constipation. Last BM was 2 days ago.

## 2021-06-30 ENCOUNTER — Other Ambulatory Visit: Payer: Self-pay

## 2021-06-30 ENCOUNTER — Encounter: Payer: Self-pay | Admitting: *Deleted

## 2021-06-30 ENCOUNTER — Emergency Department
Admission: EM | Admit: 2021-06-30 | Discharge: 2021-06-30 | Disposition: A | Discharge status: Home / Self Care | Payer: Medicare Other | Attending: Emergency Medicine | Admitting: Emergency Medicine

## 2021-06-30 DIAGNOSIS — R55 Syncope and collapse: Secondary | ICD-10-CM | POA: Diagnosis not present

## 2021-06-30 DIAGNOSIS — I499 Cardiac arrhythmia, unspecified: Secondary | ICD-10-CM | POA: Diagnosis not present

## 2021-06-30 DIAGNOSIS — R6889 Other general symptoms and signs: Secondary | ICD-10-CM | POA: Diagnosis not present

## 2021-06-30 DIAGNOSIS — Z8673 Personal history of transient ischemic attack (TIA), and cerebral infarction without residual deficits: Secondary | ICD-10-CM | POA: Diagnosis not present

## 2021-06-30 DIAGNOSIS — Z743 Need for continuous supervision: Secondary | ICD-10-CM | POA: Diagnosis not present

## 2021-06-30 DIAGNOSIS — R404 Transient alteration of awareness: Secondary | ICD-10-CM | POA: Diagnosis not present

## 2021-06-30 DIAGNOSIS — I6350 Cerebral infarction due to unspecified occlusion or stenosis of unspecified cerebral artery: Secondary | ICD-10-CM | POA: Diagnosis not present

## 2021-06-30 LAB — BASIC METABOLIC PANEL
Anion gap: 6 (ref 5–15)
BUN: 11 mg/dL (ref 8–23)
CO2: 27 mmol/L (ref 22–32)
Calcium: 8.9 mg/dL (ref 8.9–10.3)
Chloride: 101 mmol/L (ref 98–111)
Creatinine, Ser: 1 mg/dL (ref 0.44–1.00)
GFR, Estimated: 53 mL/min — ABNORMAL LOW (ref >60)
Glucose, Bld: 125 mg/dL — ABNORMAL HIGH (ref 70–99)
Potassium: 3.8 mmol/L (ref 3.5–5.1)
Sodium: 134 mmol/L — ABNORMAL LOW (ref 135–145)

## 2021-06-30 LAB — CBC WITH DIFFERENTIAL/PLATELET
Abs Immature Granulocytes: 0.01 10*3/uL (ref 0.00–0.07)
Basophils Absolute: 0.1 10*3/uL (ref 0.0–0.1)
Basophils Relative: 1 %
Eosinophils Absolute: 0.2 10*3/uL (ref 0.0–0.5)
Eosinophils Relative: 3 %
HCT: 32.7 % — ABNORMAL LOW (ref 36.0–46.0)
Hemoglobin: 10.7 g/dL — ABNORMAL LOW (ref 12.0–15.0)
Immature Granulocytes: 0 %
Lymphocytes Relative: 26 %
Lymphs Abs: 1.8 10*3/uL (ref 0.7–4.0)
MCH: 32.3 pg (ref 26.0–34.0)
MCHC: 32.7 g/dL (ref 30.0–36.0)
MCV: 98.8 fL (ref 80.0–100.0)
Monocytes Absolute: 0.6 10*3/uL (ref 0.1–1.0)
Monocytes Relative: 9 %
Neutro Abs: 4.2 10*3/uL (ref 1.7–7.7)
Neutrophils Relative %: 61 %
Platelets: 228 10*3/uL (ref 150–400)
RBC: 3.31 MIL/uL — ABNORMAL LOW (ref 3.87–5.11)
RDW: 12.9 % (ref 11.5–15.5)
WBC: 6.8 10*3/uL (ref 4.0–10.5)
nRBC: 0 % (ref 0.0–0.2)

## 2021-06-30 LAB — TROPONIN I (HIGH SENSITIVITY)
Troponin I (High Sensitivity): 6 ng/L (ref <18)
Troponin I (High Sensitivity): 6 ng/L (ref <18)

## 2021-06-30 NOTE — ED Provider Notes (Signed)
[facility] Provider Note    Event Date/Time   First MD Initiated Contact with Patient 06/30/21 1914     (approximate)   History   Syncope   HPI  [name] is a 88 y.o. female who presents to the emergency department today after suffering a syncopal episode.  Some history is obtained by caregiver at bedside.  Caregiver states that they were sitting down to supper when she noticed the patient slumped over at the waist.  She was unresponsive at this time.  Caregiver tried to sit the patient up oriented she woke up slightly however then passed out again.  Patient is amnesic to these events.  Patient states that she had otherwise had a fairly normal day today.  One new thing was that she took a first dose of Flomax earlier today.  She was just prescribed this for urinary retention issues.  Patient denies any chest pain or palpitations today.      Physical Exam   Triage Vital Signs: ED Triage Vitals  Enc Vitals Group     BP 06/30/21 1855 (!) 145/61     Pulse Rate 06/30/21 1855 61     Resp 06/30/21 1855 18     Temp 06/30/21 1855 97.9 F (36.6 C)     Temp src --      SpO2 06/30/21 1855 99 %     Weight 06/30/21 1856 120 lb (54.4 kg)     Height 06/30/21 1856 5\' 6"  (1.676 m)     Head Circumference --      Peak Flow --      Pain Score 06/30/21 1856 0     Pain Loc --      Pain Edu? --      Excl. in GC? --     Most recent vital signs: Vitals:   06/30/21 1855  BP: (!) 145/61  Pulse: 61  Resp: 18  Temp: 97.9 F (36.6 C)  SpO2: 99%   General: Awake, alert, oriented. CV:  Good peripheral perfusion. Regular rate and rhythm. Resp:  Normal effort. Lungs clear. Abd:  No distention.   ED Results / Procedures / Treatments   Labs (all labs ordered are listed, but only abnormal results are displayed) Labs Reviewed  CBC WITH DIFFERENTIAL/PLATELET - Abnormal; Notable for the following components:      Result Value   RBC 3.31 (*)    Hemoglobin 10.7  (*)    HCT 32.7 (*)    All other components within normal limits  BASIC METABOLIC PANEL - Abnormal; Notable for the following components:   Sodium 134 (*)    Glucose, Bld 125 (*)    GFR, Estimated 53 (*)    All other components within normal limits  TROPONIN I (HIGH SENSITIVITY)  TROPONIN I (HIGH SENSITIVITY)     EKG  I, Phineas Semen, attending physician, personally viewed and interpreted this EKG  EKG Time: 1901 Rate: 56 Rhythm: sinus rhythm Axis: normal Intervals: qtc 427 QRS: narrow, q waves v1, v2 ST changes: no st elevation Impression: abnormal ekg  RADIOLOGY None   PROCEDURES:  Critical Care performed: No  Procedures   MEDICATIONS ORDERED IN ED: Medications - No data to display   IMPRESSION / MDM / ASSESSMENT AND PLAN / ED COURSE  I reviewed the triage vital signs and the nursing notes.  Differential diagnosis includes, but is not limited to, anemia, electrolyte abnormality, arrhythmia, ACS, medication side effect.  Patient's presentation is most consistent with acute presentation with potential threat to life or bodily function.  Patient presented to the emergency department today because of concerns for a syncopal episode.  On my exam patient is awake and alert and family states she is back to her baseline mentation.  EKG without concerning arrhythmia.  Troponin and blood work negative.  At this time I do think possibly patient had reaction to Flomax.  She did have her first dose earlier in the day.  I discussed this with the patient.  Did discuss possible admission for further evaluation and work-up however patient felt comfortable going home at this time.  I think this is reasonable.  Did discuss that patient should not take any further doses of Flomax.  Encouraged patient to follow-up with primary care.  FINAL CLINICAL IMPRESSION(S) / ED DIAGNOSES   Final diagnoses:  Syncope, unspecified syncope type      Note:   This document was prepared using Dragon voice recognition software and may include unintentional dictation errors.    Phineas Semen, MD 06/30/21 2330

## 2021-07-01 ENCOUNTER — Other Ambulatory Visit: Payer: Self-pay | Admitting: Internal Medicine

## 2021-07-01 ENCOUNTER — Telehealth: Payer: Self-pay

## 2021-07-01 ENCOUNTER — Encounter: Payer: Self-pay | Admitting: Internal Medicine

## 2021-07-01 ENCOUNTER — Ambulatory Visit: Payer: Medicare Other | Admitting: Physician Assistant

## 2021-07-01 DIAGNOSIS — R351 Nocturia: Secondary | ICD-10-CM

## 2021-07-01 DIAGNOSIS — R339 Retention of urine, unspecified: Secondary | ICD-10-CM | POA: Diagnosis not present

## 2021-07-01 LAB — BLADDER SCAN AMB NON-IMAGING

## 2021-07-01 NOTE — Telephone Encounter (Signed)
Called pt to let her know that she would need to go to the ED to have a catheter placed and pt stated that she did urinate this morning about 7:15 am. Pt wanted to know what would happen if she did not have a catheter placed for a couple of days. Pt stated that she is urinating once about every 12 hours.

## 2021-07-01 NOTE — Telephone Encounter (Signed)
Already addressed.  See notes

## 2021-07-02 ENCOUNTER — Telehealth: Payer: Self-pay | Admitting: Internal Medicine

## 2021-07-02 ENCOUNTER — Other Ambulatory Visit: Payer: Self-pay | Admitting: Internal Medicine

## 2021-07-02 NOTE — Telephone Encounter (Signed)
Di Kindle From [facility] called in stating that pt decline home health services.Marland KitchenMarland Kitchen

## 2021-07-02 NOTE — Telephone Encounter (Signed)
Refilled: 03/08/2021 Last OV: 06/06/2021 Next OV: 07/07/2021

## 2021-07-02 NOTE — Telephone Encounter (Signed)
FYI

## 2021-07-03 DIAGNOSIS — H353221 Exudative age-related macular degeneration, left eye, with active choroidal neovascularization: Secondary | ICD-10-CM | POA: Diagnosis not present

## 2021-07-03 DIAGNOSIS — H3411 Central retinal artery occlusion, right eye: Secondary | ICD-10-CM | POA: Diagnosis not present

## 2021-07-04 ENCOUNTER — Other Ambulatory Visit: Payer: Self-pay | Admitting: Internal Medicine

## 2021-07-04 NOTE — Telephone Encounter (Signed)
Requested medications are due for refill today.  Unsure  Requested medications are on the active medications list.  no  Last refill. 06/29/2021 Signed by Webb Silversmith  Future visit scheduled.   no  Notes to clinic.  Medicatoin was discontinued 07/01/2021. Medication is not on med list. PCP listed is Deborra Medina. Unsure if pt is still a pt of [employer].    Requested Prescriptions  Pending Prescriptions Disp Refills   tamsulosin (FLOMAX) 0.4 MG CAPS capsule [Pharmacy Med Name: TAMSULOSIN 0.'4MG'$  CAPSULES] 7 capsule 0    Sig: TAKE 1 CAPSULE(0.4 MG) BY MOUTH DAILY     Urology: Alpha-Adrenergic Blocker Failed - 07/04/2021  3:39 AM      Failed - PSA in normal range and within 360 days    No results found for: "LABPSA", "PSA", "PSA1", "ULTRAPSA"       Failed - Valid encounter within last 12 months    Recent Outpatient Visits   None     Future Appointments             In 3 days Crecencio Mc, MD [place] [place]   In 1 week Bjorn Loser, MD [facility]            Passed - Last BP in normal range    BP Readings from Last 1 Encounters:  06/30/21 131/60

## 2021-07-07 ENCOUNTER — Encounter: Payer: Self-pay | Admitting: Internal Medicine

## 2021-07-07 ENCOUNTER — Ambulatory Visit (INDEPENDENT_AMBULATORY_CARE_PROVIDER_SITE_OTHER): Payer: Medicare Other | Admitting: Internal Medicine

## 2021-07-07 DIAGNOSIS — Z8673 Personal history of transient ischemic attack (TIA), and cerebral infarction without residual deficits: Secondary | ICD-10-CM

## 2021-07-07 DIAGNOSIS — R339 Retention of urine, unspecified: Secondary | ICD-10-CM | POA: Diagnosis not present

## 2021-07-07 DIAGNOSIS — I6522 Occlusion and stenosis of left carotid artery: Secondary | ICD-10-CM | POA: Diagnosis not present

## 2021-07-07 DIAGNOSIS — I679 Cerebrovascular disease, unspecified: Secondary | ICD-10-CM | POA: Diagnosis not present

## 2021-07-07 MED ORDER — LUBIPROSTONE 8 MCG PO CAPS
8.0000 ug | ORAL_CAPSULE | Freq: Two times a day (BID) | ORAL | 5 refills | Status: DC
Start: 2021-07-07 — End: 2021-08-13

## 2021-07-07 MED ORDER — ATORVASTATIN CALCIUM 20 MG PO TABS: 20.0000 mg | ORAL_TABLET | Freq: Every day | ORAL | 1 refills | Status: DC

## 2021-07-07 NOTE — Patient Instructions (Addendum)
I'm glad you are home!  Let's try and keep you there!    You should continue an aspirin and Lipitor  every day for the rest of your life to prevent more strokes.  If you want to warm up,  start moving more frequently !    For the constipation:   Ideally you  need 60 ounces of liquid daily (NON CAFFEINATED)  You can Add Benefiber (instead of Miralax) to two glasses of water or beverage ;  she needs  2 servings daily   I am recommending a trial of Amitiza (lubipristone) to help move your bowels . This is a prescription   Eat what you want in moderation and when you are alert and sitting up!

## 2021-07-07 NOTE — Progress Notes (Unsigned)
Subjective:  Patient ID: [name], female    DOB: 29-Dec-1926  Age: 88 y.o. MRN: [id number]  CC: There were no encounter diagnoses.   HPI [name] presents for follow up after being discharged from [facility] . Accomapnied by Diane her aide.  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  She was  initially sent home  from [facility] instead of to rehab,  but was seen here an having increased difficulty at home  she was readmitted to [facility]  and from there was transferred to  [facility] for rehab for a 17 day stay   After dc home from [place] she developed worsening urinary retention resulting in ER visits, ,then a Urology visit resulting  in use of Flomax, which caused a syncopal episode and required ER visit  bp was 70/40.     Currently urinating 8 to 12 hours.  Urology following. Kidney ultrasound planned  Barely drinks 32 ounces of water (I can barely get it down )   chronic constipation,   not moving every 2 to 3 days.  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  Moving around less than pre stroke.  C/o being cold from taking asa   Swallowing better when not rushed. Not using straws.  Using apple sauce for some pills .  Eating watermelon  Speech slurred but intelligible.     Outpatient Medications Prior to Visit  Medication Sig Dispense Refill   amLODipine (NORVASC) 5 MG tablet TAKE ONE TABLET BY MOUTH ONCE DAILY 90 tablet 1   Ascorbic Acid (VITAMIN C) 1000 MG tablet Take 1,000 mg by mouth every other day.      ascorbic acid (VITAMIN C) 500 MG tablet Take 500 mg by mouth daily.     aspirin 81 MG chewable tablet Chew 81 mg by mouth daily.     Calcium Carb-Cholecalciferol (CALCIUM 600/VITAMIN D3 PO) Take 1 tablet by mouth daily.     clopidogrel (PLAVIX) 75 MG tablet Take 75 mg by mouth daily.     diazepam (VALIUM) 5 MG tablet TAKE 1 TABLET BY MOUTH EVERY DAY AS NEEDED FOR ANXIETY OR MUSCLE SPASMS 30 tablet 1   FIBER PO Take 1 capsule by mouth in the morning, at noon, and at bedtime.     FIBERCON 625 MG tablet Take 625 mg by mouth daily.     Lutein 20 MG CAPS Take 20 mg by mouth daily.       MIRALAX 17 GM/SCOOP powder Take 17 g by mouth daily.     propranolol ER (INDERAL LA) 60 MG 24 hr capsule TAKE 1 CAPSULE BY MOUTH EVERY DAY 90 capsule 1   Propylene Glycol (SYSTANE BALANCE OP) Place 1 drop into both eyes 4 (four) times daily as needed (dry eyes).     sennosides-docusate sodium (SENOKOT-S) 8.6-50 MG tablet Take 1 tablet by mouth daily as needed for constipation.     vitamin E 180 MG (400 UNITS) capsule Take 400 Units by mouth daily.  Vitamin E 400 units TABS Take 1 tablet by mouth daily.     atorvastatin (LIPITOR) 20 MG tablet      No facility-administered medications prior to visit.    Review of Systems;  Patient denies headache, fevers, malaise, unintentional weight loss, skin rash, eye pain, sinus congestion and sinus pain, sore throat, dysphagia,  hemoptysis , cough, dyspnea, wheezing, chest pain, palpitations, orthopnea, edema, abdominal pain, nausea, melena, diarrhea, constipation, flank pain, dysuria, hematuria, urinary  Frequency, nocturia, numbness, tingling, seizures,  Focal weakness, Loss of consciousness,  Tremor, insomnia, depression, anxiety, and suicidal ideation.      Objective:  BP 138/80 (BP Location: Left Arm, Patient Position: Sitting, Cuff Size: Normal)   Pulse 64   Ht '5\' 6"'$  (1.676 m)   Wt 123 lb (55.8 kg)   SpO2 95%   BMI 19.85 kg/m   BP Readings from Last 3  Encounters:  07/07/21 138/80  06/30/21 131/60  06/29/21 (!) 150/67    Wt Readings from Last 3 Encounters:  07/07/21 123 lb (55.8 kg)  06/30/21 120 lb (54.4 kg)  06/29/21 128 lb 4.9 oz (58.2 kg)    General appearance: alert, cooperative and appears stated age Ears: normal TM's and external ear canals both ears Throat: lips, mucosa, and tongue normal; teeth and gums normal Neck: no adenopathy, no carotid bruit, supple, symmetrical, trachea midline and thyroid not enlarged, symmetric, no tenderness/mass/nodules Back: symmetric, no curvature. ROM normal. No CVA tenderness. Lungs: clear to auscultation bilaterally Heart: regular rate and rhythm, S1, S2 normal, no murmur, click, rub or gallop Abdomen: soft, non-tender; bowel sounds normal; no masses,  no organomegaly Pulses: 2+ and symmetric Skin: Skin color, texture, turgor normal. No rashes or lesions Lymph nodes: Cervical, supraclavicular, and axillary nodes normal.  No results found for: "HGBA1C"  Lab Results  Component Value Date   CREATININE 1.00 06/30/2021   CREATININE 0.72 06/29/2021   CREATININE 0.70 10/16/2020    Lab Results  Component Value Date   WBC 6.8 06/30/2021   HGB 10.7 (L) 06/30/2021   HCT 32.7 (L) 06/30/2021   PLT 228 06/30/2021   GLUCOSE 125 (H) 06/30/2021   CHOL 254 (H) 06/09/2016   TRIG 103.0 06/09/2016   HDL 60.00 06/09/2016   LDLDIRECT 145.6 12/21/2012   LDLCALC 174 (H) 06/09/2016   ALT 9 06/02/2019   AST 17 06/02/2019   NA 134 (L) 06/30/2021   K 3.8 06/30/2021   CL 101 06/30/2021   CREATININE 1.00 06/30/2021   BUN 11 06/30/2021   CO2 27 06/30/2021   TSH 1.38 09/14/2019   INR 0.99 11/30/2016    No results found.  Assessment & Plan:   Problem List Items Addressed This Visit   None   I spent a total of   minutes with this patient in a face to face visit on the date of this encounter reviewing the last office visit with me on        ,  most recent with patient's cardiologist in    ,   patient'ss diet and eating habits, home blood pressure readings ,  most recent imaging study ,   and post visit ordering of testing and therapeutics.    Follow-up: No follow-ups on file.   Crecencio Mc, MD

## 2021-07-08 NOTE — Assessment & Plan Note (Signed)
Acute on chronic,  Aggravated by recent left pontine CVA.  Did not tolerate Flomax due to severe orthostatic hypotension resulting in recurrent syncope.  Advised to mange constipation more aggressively (trial of amitiza) and increase liquid intake/  Follow up with Urology  Renal function is normal.   Lab Results  Component Value Date   CREATININE 1.00 06/30/2021

## 2021-07-08 NOTE — Assessment & Plan Note (Signed)
She has now suffered a second stroke,  Which occurred after she had discontinued asa and statin.  She has been advised to continue aspirin and statin

## 2021-07-08 NOTE — Assessment & Plan Note (Signed)
Her deficits have improved with PT, .  She is tolerating asa and statin .  Regardess of the etiology, she is adamant about remaining home and has a full time assistance now with paid caregivers and family members.

## 2021-07-10 ENCOUNTER — Ambulatory Visit
Admission: RE | Admit: 2021-07-10 | Discharge: 2021-07-10 | Disposition: A | Discharge status: Home / Self Care | Payer: Medicare Other | Source: Ambulatory Visit | Attending: Physician Assistant | Admitting: Physician Assistant

## 2021-07-10 DIAGNOSIS — R339 Retention of urine, unspecified: Secondary | ICD-10-CM

## 2021-07-10 DIAGNOSIS — N2889 Other specified disorders of kidney and ureter: Secondary | ICD-10-CM | POA: Insufficient documentation

## 2021-07-10 DIAGNOSIS — N1330 Unspecified hydronephrosis: Secondary | ICD-10-CM | POA: Diagnosis not present

## 2021-07-14 ENCOUNTER — Ambulatory Visit: Payer: Medicare Other | Admitting: Urology

## 2021-07-14 ENCOUNTER — Telehealth: Payer: Self-pay | Admitting: Physician Assistant

## 2021-07-14 NOTE — Telephone Encounter (Signed)
RUS looks okay, please follow up with Dr. Matilde Sprang as scheduled.

## 2021-07-14 NOTE — Telephone Encounter (Addendum)
Spoke with patient and caregiver and they where told to not come in today due to her seeing Sam last week.  Pt will follow-up as needed Spoke with patient and advised results

## 2021-07-14 NOTE — Telephone Encounter (Signed)
Diane (caregiver) is calling for [name]'s  ultrasound results, please call at [phone].

## 2021-07-24 DIAGNOSIS — R7401 Elevation of levels of liver transaminase levels: Secondary | ICD-10-CM | POA: Diagnosis not present

## 2021-07-24 DIAGNOSIS — I639 Cerebral infarction, unspecified: Secondary | ICD-10-CM | POA: Diagnosis not present

## 2021-07-24 DIAGNOSIS — I6522 Occlusion and stenosis of left carotid artery: Secondary | ICD-10-CM | POA: Diagnosis not present

## 2021-07-24 DIAGNOSIS — Z7982 Long term (current) use of aspirin: Secondary | ICD-10-CM | POA: Diagnosis not present

## 2021-07-24 DIAGNOSIS — R531 Weakness: Secondary | ICD-10-CM | POA: Diagnosis not present

## 2021-07-24 DIAGNOSIS — Z7902 Long term (current) use of antithrombotics/antiplatelets: Secondary | ICD-10-CM | POA: Diagnosis not present

## 2021-07-24 DIAGNOSIS — Z79899 Other long term (current) drug therapy: Secondary | ICD-10-CM | POA: Diagnosis not present

## 2021-07-24 DIAGNOSIS — R471 Dysarthria and anarthria: Secondary | ICD-10-CM | POA: Diagnosis not present

## 2021-07-24 DIAGNOSIS — E785 Hyperlipidemia, unspecified: Secondary | ICD-10-CM | POA: Diagnosis not present

## 2021-07-24 DIAGNOSIS — I10 Essential (primary) hypertension: Secondary | ICD-10-CM | POA: Diagnosis not present

## 2021-07-24 DIAGNOSIS — R29810 Facial weakness: Secondary | ICD-10-CM | POA: Diagnosis not present

## 2021-07-24 DIAGNOSIS — Z8673 Personal history of transient ischemic attack (TIA), and cerebral infarction without residual deficits: Secondary | ICD-10-CM | POA: Diagnosis not present

## 2021-07-24 DIAGNOSIS — R1310 Dysphagia, unspecified: Secondary | ICD-10-CM | POA: Diagnosis not present

## 2021-08-07 DIAGNOSIS — H3562 Retinal hemorrhage, left eye: Secondary | ICD-10-CM | POA: Diagnosis not present

## 2021-08-07 DIAGNOSIS — H3411 Central retinal artery occlusion, right eye: Secondary | ICD-10-CM | POA: Diagnosis not present

## 2021-08-13 ENCOUNTER — Encounter: Payer: Self-pay | Admitting: Internal Medicine

## 2021-08-13 ENCOUNTER — Ambulatory Visit (INDEPENDENT_AMBULATORY_CARE_PROVIDER_SITE_OTHER): Payer: Medicare Other | Admitting: Internal Medicine

## 2021-08-13 DIAGNOSIS — Z8673 Personal history of transient ischemic attack (TIA), and cerebral infarction without residual deficits: Secondary | ICD-10-CM

## 2021-08-13 DIAGNOSIS — F5104 Psychophysiologic insomnia: Secondary | ICD-10-CM

## 2021-08-13 DIAGNOSIS — M791 Myalgia, unspecified site: Secondary | ICD-10-CM | POA: Diagnosis not present

## 2021-08-13 DIAGNOSIS — T466X5A Adverse effect of antihyperlipidemic and antiarteriosclerotic drugs, initial encounter: Secondary | ICD-10-CM | POA: Diagnosis not present

## 2021-08-13 DIAGNOSIS — E785 Hyperlipidemia, unspecified: Secondary | ICD-10-CM | POA: Diagnosis not present

## 2021-08-13 NOTE — Patient Instructions (Addendum)
I will continue to refill your diazepam  for your insomnia.   You might want to try adding  "Relaxium" for insomnia  (as seen on TV commercials) . It contains:  Melatonin 5 mg  Chamomile 25 mg Passionflower extract 75 mg GABA 100 mg Ashwaganda extract 125 mg Magnesium citrate, glycinate, oxide (100 mg)  L tryptophan 500 mg Valerest (proprietary  ingredient ; probably valeria root extract)

## 2021-08-13 NOTE — Progress Notes (Signed)
Subjective:  Patient ID: [name], female    DOB: 1926-12-21  Age: 88 y.o. MRN: [id number]  CC: Diagnoses of Psychophysiological insomnia, Myalgia due to statin, Hyperlipidemia LDL goal <130, and History of CVA (cerebrovascular accident) were pertinent to this visit.   HPI [name] presents for  Chief Complaint  Patient presents with   Follow-up    4 week follow up     Last seen July 3 after [facility] admission for left pontine CVA in May.  Reviewed last neurology evaluation via Epic portal.    Her neurologist advised her to stop atorvastatin and diazepam due to mental status changes.  Atorvastatin was stopped and she is deferring the advice to resume a lower potency statin (pravastatin) at some point  given LDL 180 and CVA  Diazepam was NOT stopped despite advice from the neurologist. Mental status has improved and she is independent of ADL's. . She failed a trial of melatonin which only helped until 2 am.  Still waking up after 6 to 7  hours and can't go back to sleep    per caregiver her mental status has improved "everything is better"  more physically able to complete ADLs and house chores.  Put up 16 pints of tomatoes,  picked  blueberries and made pies.   Outpatient Medications Prior to Visit  Medication Sig Dispense Refill   amLODipine (NORVASC) 5 MG tablet TAKE ONE TABLET BY MOUTH ONCE DAILY 90 tablet 1   Calcium Carb-Cholecalciferol (CALCIUM 600/VITAMIN D3 PO) Take 1 tablet by mouth daily.     diazepam (VALIUM) 5 MG tablet TAKE 1 TABLET BY MOUTH EVERY DAY AS NEEDED FOR ANXIETY OR MUSCLE SPASMS 30 tablet 1   FIBER PO Take 1 capsule by mouth in the morning, at noon, and at bedtime.     FIBERCON 625 MG tablet Take 625 mg by mouth daily.     Lutein 20 MG CAPS Take 20 mg by mouth daily.       propranolol ER (INDERAL LA) 60 MG 24 hr capsule TAKE 1 CAPSULE BY MOUTH EVERY DAY 90 capsule 1   Propylene Glycol (SYSTANE BALANCE OP) Place 1 drop into both eyes 4 (four) times  daily as needed (dry eyes).     sennosides-docusate sodium (SENOKOT-S) 8.6-50 MG tablet Take 1 tablet by mouth daily as needed for constipation.     Vitamin E 400 units TABS Take 1 tablet by mouth daily.     ascorbic acid (VITAMIN C) 500 MG tablet Take 500 mg by mouth daily.     Ascorbic Acid (VITAMIN C) 1000 MG tablet Take 1,000 mg by mouth every other day. (Patient not taking: Reported on 08/13/2021)     aspirin 81 MG chewable tablet Chew 81 mg by mouth daily. (Patient not taking: Reported on 08/13/2021)     atorvastatin (LIPITOR) 20 MG tablet Take 1 tablet (20 mg total) by mouth daily. (Patient not taking: Reported on 08/13/2021) 90 tablet 1   clopidogrel (PLAVIX) 75 MG tablet Take 75 mg by mouth daily. (Patient not taking: Reported on 08/13/2021)     lubiprostone (AMITIZA) 8 MCG capsule Take 1 capsule (8 mcg total) by mouth 2 (two) times daily with a meal. (Patient not taking: Reported on 08/13/2021) 30 capsule 5   MIRALAX 17 GM/SCOOP powder Take 17 g by mouth daily. (Patient not taking: Reported on 08/13/2021)     vitamin E 180 MG (400 UNITS) capsule Take 400 Units by mouth daily.  (Patient not  taking: Reported on 08/13/2021)     No facility-administered medications prior to visit.    Review of Systems;  Patient denies headache, fevers, malaise, unintentional weight loss, skin rash, eye pain, sinus congestion and sinus pain, sore throat, dysphagia,  hemoptysis , cough, dyspnea, wheezing, chest pain, palpitations, orthopnea, edema, abdominal pain, nausea, melena, diarrhea, constipation, flank pain, dysuria, hematuria, urinary  Frequency, nocturia, numbness, tingling, seizures,  Focal weakness, Loss of consciousness,  Tremor, insomnia, depression, anxiety, and suicidal ideation.      Objective:  BP (!) 144/72 (BP Location: Left Arm, Patient Position: Sitting, Cuff Size: Normal)   Pulse (!) 59   Temp (!) 97.3 F (36.3 C) (Oral)   Ht '5\' 6"'$  (1.676 m)   Wt 119 lb 12.8 oz (54.3 kg)   SpO2 95%   BMI  19.34 kg/m   BP Readings from Last 3 Encounters:  08/13/21 (!) 144/72  07/07/21 138/80  06/30/21 131/60    Wt Readings from Last 3 Encounters:  08/13/21 119 lb 12.8 oz (54.3 kg)  07/07/21 123 lb (55.8 kg)  06/30/21 120 lb (54.4 kg)    General appearance: alert, cooperative and appears younger than  stated age Ears: normal TM's and external ear canals both ears Throat: lips, mucosa, and tongue normal; teeth and gums normal Neck: no adenopathy, no carotid bruit, supple, symmetrical, trachea midline and thyroid not enlarged, symmetric, no tenderness/mass/nodules Back: symmetric, no curvature. ROM normal. No CVA tenderness. Lungs: clear to auscultation bilaterally Heart: regular rate and rhythm, S1, S2 normal, no murmur, click, rub or gallop Abdomen: soft, non-tender; bowel sounds normal; no masses,  no organomegaly Pulses: 2+ and symmetric Skin: Skin color, texture, turgor normal. No rashes or lesions Lymph nodes: Cervical, supraclavicular, and axillary nodes normal. Neuro: CNs 2-12 intact except for mild dysarthria.  DTRs 2+/4 in biceps, brachioradialis, patellars and achilles. Muscle strength 5/5 in upper and lower exremities. Fine resting tremor bilaterally both hands cerebellar function normal. Romberg negative.  No pronator drift.   Gait normal.    No results found for: "HGBA1C"  Lab Results  Component Value Date   CREATININE 1.00 06/30/2021   CREATININE 0.72 06/29/2021   CREATININE 0.70 10/16/2020    Lab Results  Component Value Date   WBC 6.8 06/30/2021   HGB 10.7 (L) 06/30/2021   HCT 32.7 (L) 06/30/2021   PLT 228 06/30/2021   GLUCOSE 125 (H) 06/30/2021   CHOL 254 (H) 06/09/2016   TRIG 103.0 06/09/2016   HDL 60.00 06/09/2016   LDLDIRECT 145.6 12/21/2012   LDLCALC 174 (H) 06/09/2016   ALT 9 06/02/2019   AST 17 06/02/2019   NA 134 (L) 06/30/2021   K 3.8 06/30/2021   CL 101 06/30/2021   CREATININE 1.00 06/30/2021   BUN 11 06/30/2021   CO2 27 06/30/2021   TSH  1.38 09/14/2019   INR 0.99 11/30/2016    Ultrasound renal complete  Result Date: 07/11/2021 CLINICAL DATA:  Incomplete emptying of the urinary bladder. EXAM: RENAL / URINARY TRACT ULTRASOUND COMPLETE COMPARISON:  Pelvic ultrasound 05/01/2021 FINDINGS: Right Kidney: Renal measurements: 9.4 x 5.0 x 5.1 cm = volume: 124 mL. Echogenicity within normal limits. No mass or hydronephrosis visualized. Left Kidney: Renal measurements: 9.7 x 4.1 x 4.0 cm = volume: 83 mL. Normal renal cortical thickness and echogenicity. Mild pelviectasis. No renal mass. Bladder: Appears normal for degree of bladder distention. Other: Prevoid: 412 cc. Patient was not able to void while present for the examination. IMPRESSION: Mild left pelviectasis. Electronically Signed  By: Lovey Newcomer M.D.   On: 07/11/2021 12:31    Assessment & Plan:   Problem List Items Addressed This Visit     Hyperlipidemia LDL goal <130    She prefers to remain off of statins given her marked improvement in energy,  Coordination and general well being since stopping the atorvastatin.  Reviewed recet labs from neurology:  Ck and hepatic panel were normal.      Insomnia    She has failed previous trials of melatonin and buspirone. She cannot sleep without the low dose of diazepam and understands the risk of using this medication in the elderly.       History of CVA (cerebrovascular accident)    Her physical strength has returned.  She remains mildly dysarthric.  She has stopped statin and does not want to resume      Myalgia due to statin    I spent a total of  26 minutes with this patient in a face to face visit on the date of this encounter reviewing the last office visit with me in July  he rmost recent with patient's  neurologist at [facility],  most recent imaging study ,   and post visit ordering of testing and therapeutics.    Follow-up: Return in about 6 months (around 02/13/2022).   Crecencio Mc, MD

## 2021-08-13 NOTE — Assessment & Plan Note (Signed)
She has failed previous trials of melatonin and buspirone. She cannot sleep without the low dose of diazepam and understands the risk of using this medication in the elderly.

## 2021-08-13 NOTE — Assessment & Plan Note (Signed)
She prefers to remain off of statins given her marked improvement in energy,  Coordination and general well being since stopping the atorvastatin.  Reviewed recet labs from neurology:  Ck and hepatic panel were normal.

## 2021-08-13 NOTE — Assessment & Plan Note (Signed)
Her physical strength has returned.  She remains mildly dysarthric.  She has stopped statin and does not want to resume

## 2021-09-02 ENCOUNTER — Other Ambulatory Visit: Payer: Self-pay | Admitting: Internal Medicine

## 2021-09-10 ENCOUNTER — Other Ambulatory Visit: Payer: Self-pay | Admitting: Internal Medicine

## 2021-09-18 DIAGNOSIS — H353221 Exudative age-related macular degeneration, left eye, with active choroidal neovascularization: Secondary | ICD-10-CM | POA: Diagnosis not present

## 2021-09-18 DIAGNOSIS — H3411 Central retinal artery occlusion, right eye: Secondary | ICD-10-CM | POA: Diagnosis not present

## 2021-09-18 DIAGNOSIS — H04122 Dry eye syndrome of left lacrimal gland: Secondary | ICD-10-CM | POA: Diagnosis not present

## 2021-10-31 ENCOUNTER — Telehealth: Payer: Self-pay | Admitting: Internal Medicine

## 2021-10-31 DIAGNOSIS — U071 COVID-19: Secondary | ICD-10-CM | POA: Insufficient documentation

## 2021-10-31 MED ORDER — MOLNUPIRAVIR EUA 200MG CAPSULE: 4.0000 | ORAL_CAPSULE | Freq: Two times a day (BID) | ORAL | 0 refills | Status: AC

## 2021-10-31 NOTE — Addendum Note (Signed)
Addended by: Crecencio Mc on: 10/31/2021 03:17 PM   Modules accepted: Orders

## 2021-10-31 NOTE — Telephone Encounter (Signed)
Pt would like to have the antiviral sent in

## 2021-10-31 NOTE — Assessment & Plan Note (Signed)
Symptoms were mild on day of notification , Oct 27,  molnupiravir sent.

## 2021-10-31 NOTE — Telephone Encounter (Signed)
Varney Baas, patient's niece called. Patient tested positive for COVID today. Patient has a sore throat, fever was 99.9, no cough, no body aches, no chest or head congestion. She is requesting the antiviral medication.

## 2021-11-03 NOTE — Telephone Encounter (Signed)
Spoke with pt to let her know that the medication has been sent in. Pt stated that her niece did a telephone visit to get the antiviral prescribed because she had not heard from our office. Pt stated that the medication that was sent in was Paxlovid, pt was advised not to take per Dr. Derrel Nip verbal. I let her know that Dr. Derrel Nip sent in a different antiviral for her to take. I also called pt's niece to let her know that Dr. Derrel Nip would like for pt to take the mulnupirvir instead of the paxlovid. Niece gave a verbal understanding.

## 2021-12-02 ENCOUNTER — Other Ambulatory Visit: Payer: Self-pay | Admitting: Internal Medicine

## 2021-12-02 NOTE — Telephone Encounter (Signed)
Refilled: 09/02/2021 Last OV: 08/13/2021 Next OV: not scheduled

## 2021-12-11 DIAGNOSIS — H04122 Dry eye syndrome of left lacrimal gland: Secondary | ICD-10-CM | POA: Diagnosis not present

## 2021-12-11 DIAGNOSIS — H3411 Central retinal artery occlusion, right eye: Secondary | ICD-10-CM | POA: Diagnosis not present

## 2021-12-11 DIAGNOSIS — H353221 Exudative age-related macular degeneration, left eye, with active choroidal neovascularization: Secondary | ICD-10-CM | POA: Diagnosis not present

## 2022-02-11 DIAGNOSIS — D0471 Carcinoma in situ of skin of right lower limb, including hip: Secondary | ICD-10-CM | POA: Diagnosis not present

## 2022-03-04 ENCOUNTER — Telehealth: Payer: Self-pay | Admitting: Internal Medicine

## 2022-03-04 ENCOUNTER — Other Ambulatory Visit: Payer: Self-pay

## 2022-03-04 MED ORDER — DIAZEPAM 5 MG PO TABS: ORAL_TABLET | ORAL | 0 refills | Status: DC

## 2022-03-04 NOTE — Telephone Encounter (Signed)
Sent to Dr Derrel Nip for approval

## 2022-03-04 NOTE — Telephone Encounter (Signed)
Prescription Request  03/04/2022  Is this a "Controlled Substance" medicine? No  LOV: 08/13/2021  What is the name of the medication or equipment? diazepam (VALIUM) 5 MG tablet  Have you contacted your pharmacy to request a refill? Yes   Which pharmacy would you like this sent to?   [facility] ST[facility] [place], [address] AT [address] [place] [address] Phone: [phone] Fax: [phone]    Patient notified that their request is being sent to the clinical staff for review and that they should receive a response within 2 business days.   Please advise at [facility] [phone]   As per pt, she would like a 90 day supply.

## 2022-03-09 ENCOUNTER — Encounter: Payer: Self-pay | Admitting: Internal Medicine

## 2022-03-09 ENCOUNTER — Ambulatory Visit (INDEPENDENT_AMBULATORY_CARE_PROVIDER_SITE_OTHER): Payer: Medicare Other | Admitting: Internal Medicine

## 2022-03-09 VITALS — BP 144/90 | HR 67 | Temp 97.4°F | Ht 66.0 in | Wt 122.2 lb

## 2022-03-09 DIAGNOSIS — I10 Essential (primary) hypertension: Secondary | ICD-10-CM | POA: Diagnosis not present

## 2022-03-09 DIAGNOSIS — F5104 Psychophysiologic insomnia: Secondary | ICD-10-CM | POA: Diagnosis not present

## 2022-03-09 DIAGNOSIS — E871 Hypo-osmolality and hyponatremia: Secondary | ICD-10-CM | POA: Diagnosis not present

## 2022-03-09 NOTE — Patient Instructions (Addendum)
I would like you to try  to reduce your dose of valium  to 1/2 tablet by trying   Relaxium for insomnia  (as seen on TV commercials) . It is available through [employer] and contains all natural supplements:  Melatonin 5 mg  Chamomile 25 mg Passionflower extract 75 mg GABA 100 mg Ashwaganda extract 125 mg Magnesium citrate, glycinate, oxide (100 mg)  L tryptophan 500 mg Valerest (proprietary  ingredient ; probably valeria root extract   Please keep an eye on your Blood pressure.  If you start noticing BP that are  all  > 150/90 or higher  you can increase the amlodipine to 10 mg daily and let me know so I can change your prescription

## 2022-03-09 NOTE — Assessment & Plan Note (Addendum)
She has failed previous trials of melatonin , trazodone and buspirone. She cannot sleep without the low dose of diazepam and understands the risk of using this medication in the elderly.   Recommending trial of  Relaxium to try to  reduce dose of valium to 2.5 mg

## 2022-03-09 NOTE — Assessment & Plan Note (Signed)
Home readings have been labile with many < [id number] systolic on current regimen of amlodipine and propranolol ER.  Advised to increase amlodipine to 10 mg daily for readings consistently > 150 given h/o pontine  CVA

## 2022-03-09 NOTE — Progress Notes (Signed)
Subjective:  Patient ID: [name], female    DOB: 1926-11-21  Age: 89 y.o. MRN: [id number]  CC: The primary encounter diagnosis was Psychophysiological insomnia. Diagnoses of Hyponatremia and Essential hypertension were also pertinent to this visit.   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  Readings have been labile ,  most recent reading 144/90.  She is anxious because she had to hire someone to drive her today bc she has not driven except to [facility] since her CVA May 2023   2) Insomnia:  has been managed with 5 mg diazepam for years due to multiple unsuccessful trials  of trazodone,  buspirone and melatonin.  Tylenol PM   3) Had a "COVID " infection    that was very mild in October.   Outpatient Medications Prior to Visit  Medication Sig Dispense Refill   amLODipine (NORVASC) 5 MG tablet TAKE ONE TABLET BY MOUTH ONCE DAILY 90 tablet 1   diazepam (VALIUM) 5 MG tablet TAKE 1 TABLET BY MOUTH EVERY DAY AS NEEDED FOR ANXIETY OR MUSCLE SPASMS 30 tablet 0   propranolol ER (INDERAL LA) 60 MG 24 hr capsule TAKE 1 CAPSULE BY MOUTH EVERY DAY 90 capsule 1   Calcium Carb-Cholecalciferol (CALCIUM 600/VITAMIN D3 PO) Take 1 tablet by mouth daily. (Patient not taking: Reported on 03/09/2022)     FIBER PO Take 1 capsule by mouth in the morning, at noon, and at bedtime. (Patient not taking: Reported on 03/09/2022)     FIBERCON 625 MG tablet Take 625 mg by mouth daily. (Patient not taking: Reported on 03/09/2022)     Lutein 20 MG CAPS Take 20 mg by mouth daily.   (Patient not taking: Reported on 03/09/2022)     Propylene Glycol (SYSTANE BALANCE OP) Place 1 drop into both eyes 4 (four) times daily as needed (dry eyes). (Patient not taking: Reported  on 03/09/2022)     sennosides-docusate sodium (SENOKOT-S) 8.6-50 MG tablet Take 1 tablet by mouth daily as needed for constipation. (Patient not taking: Reported on 03/09/2022)     Vitamin E 400 units TABS Take 1 tablet by mouth daily. (Patient not taking: Reported on 03/09/2022)     No facility-administered medications prior to visit.    Review of Systems;  Patient denies headache, fevers, malaise, unintentional weight loss, skin rash, eye pain, sinus congestion and sinus pain, sore throat, dysphagia,  hemoptysis , cough, dyspnea, wheezing, chest pain, palpitations, orthopnea, edema, abdominal pain, nausea, melena, diarrhea, constipation, flank pain, dysuria, hematuria, urinary  Frequency, nocturia, numbness, tingling, seizures,  Focal weakness, Loss of consciousness,  Tremor, insomnia, depression, anxiety, and suicidal ideation.      Objective:  BP (!) 144/90   Pulse 67   Temp (!) 97.4 F (36.3 C) (Oral)   Ht '5\' 6"'$  (1.676 m)   Wt 122 lb 3.2 oz (55.4 kg)   SpO2 97%   BMI 19.72 kg/m   BP Readings from Last 3 Encounters:  03/09/22 (!) 144/90  08/13/21 (!) 144/72  07/07/21 138/80    Wt Readings from Last 3 Encounters:  03/09/22 122 lb 3.2 oz (55.4 kg)  08/13/21 119 lb 12.8 oz (54.3 kg)  07/07/21 123 lb (55.8 kg)    Physical Exam Vitals reviewed.  Constitutional:      General: She is not in acute distress.    Appearance: Normal appearance. She is normal weight. She is not ill-appearing, toxic-appearing or diaphoretic.  HENT:     Head: Normocephalic.  Eyes:     General: No scleral icterus.       Right eye: No discharge.        Left eye: No discharge.     Conjunctiva/sclera: Conjunctivae normal.  Cardiovascular:     Rate and Rhythm: Normal rate and regular rhythm.     Heart sounds: Normal heart sounds.  Pulmonary:     Effort: Pulmonary effort is normal. No respiratory distress.     Breath sounds: Normal breath sounds.  Musculoskeletal:        General: Normal range of  motion.     Right lower leg: No edema.     Left lower leg: No edema.  Skin:    General: Skin is warm and dry.  Neurological:     General: No focal deficit present.     Mental Status: She is alert and oriented to person, place, and time. Mental status is at baseline.  Psychiatric:        Mood and Affect: Mood normal.        Behavior: Behavior normal.        Thought Content: Thought content normal.        Judgment: Judgment normal.    No results found for: "HGBA1C"  Lab Results  Component Value Date   CREATININE 1.00 06/30/2021   CREATININE 0.72 06/29/2021   CREATININE 0.70 10/16/2020    Lab Results  Component Value Date   WBC 6.8 06/30/2021   HGB 10.7 (L) 06/30/2021   HCT 32.7 (L) 06/30/2021   PLT 228 06/30/2021   GLUCOSE 125 (H) 06/30/2021   CHOL 254 (H) 06/09/2016   TRIG 103.0 06/09/2016   HDL 60.00 06/09/2016   LDLDIRECT 145.6 12/21/2012   LDLCALC 174 (H) 06/09/2016   ALT 9 06/02/2019   AST 17 06/02/2019   NA 134 (L) 06/30/2021   K 3.8 06/30/2021   CL 101 06/30/2021   CREATININE 1.00 06/30/2021   BUN 11 06/30/2021   CO2 27 06/30/2021   TSH 1.38 09/14/2019   INR 0.99 11/30/2016    Ultrasound renal complete  Result Date: 07/11/2021 CLINICAL DATA:  Incomplete emptying of the urinary bladder. EXAM: RENAL / URINARY TRACT ULTRASOUND COMPLETE COMPARISON:  Pelvic ultrasound 05/01/2021 FINDINGS: Right Kidney: Renal measurements: 9.4 x 5.0 x 5.1 cm = volume: 124 mL. Echogenicity within normal limits. No mass or hydronephrosis visualized. Left Kidney: Renal measurements: 9.7 x 4.1 x 4.0 cm = volume: 83 mL. Normal renal cortical thickness and echogenicity. Mild pelviectasis. No renal mass. Bladder: Appears normal for degree of bladder distention. Other: Prevoid: 412 cc. Patient was not able to void while present for the examination. IMPRESSION: Mild left pelviectasis. Electronically Signed   By: Lovey Newcomer M.D.   On: 07/11/2021 12:31    Assessment & Plan:   .Psychophysiological insomnia Assessment & Plan: She has failed previous trials of melatonin , trazodone and buspirone. She cannot sleep without the low dose of diazepam and understands the risk of using this medication in the elderly.   Recommending trial of  Relaxium to try to  reduce dose of valium to 2.5 mg    Hyponatremia -     Comprehensive metabolic panel  Essential hypertension  Assessment & Plan: Home readings have been labile with many < [id number] systolic on current regimen of amlodipine and propranolol ER.  Advised to increase amlodipine to 10 mg daily for readings consistently > 150 given h/o pontine  CVA   Orders: -     Microalbumin / creatinine urine ratio -     Comprehensive metabolic panel     Follow-up: Return in about 6 months (around 09/09/2022).   Crecencio Mc, MD

## 2022-03-10 LAB — COMPREHENSIVE METABOLIC PANEL
ALT: 7 U/L (ref 0–35)
AST: 16 U/L (ref 0–37)
Albumin: 4.1 g/dL (ref 3.5–5.2)
Alkaline Phosphatase: 47 U/L (ref 39–117)
BUN: 20 mg/dL (ref 6–23)
CO2: 29 mEq/L (ref 19–32)
Calcium: 9.8 mg/dL (ref 8.4–10.5)
Chloride: 101 mEq/L (ref 96–112)
Creatinine, Ser: 0.82 mg/dL (ref 0.40–1.20)
GFR: 61.74 mL/min (ref >60.00)
Glucose, Bld: 83 mg/dL (ref 70–99)
Potassium: 4.4 mEq/L (ref 3.5–5.1)
Sodium: 139 mEq/L (ref 135–145)
Total Bilirubin: 0.4 mg/dL (ref 0.2–1.2)
Total Protein: 7.2 g/dL (ref 6.0–8.3)

## 2022-03-12 LAB — MICROALBUMIN / CREATININE URINE RATIO
Creatinine,U: 109.1 mg/dL
Microalb Creat Ratio: 5.3 mg/g (ref 0.0–30.0)
Microalb, Ur: 5.8 mg/dL — ABNORMAL HIGH (ref 0.0–1.9)

## 2022-03-17 ENCOUNTER — Telehealth: Payer: Self-pay | Admitting: Family

## 2022-03-23 ENCOUNTER — Other Ambulatory Visit: Payer: Self-pay

## 2022-03-23 MED ORDER — PROPRANOLOL HCL ER 60 MG PO CP24: ORAL_CAPSULE | ORAL | 1 refills | Status: DC

## 2022-03-23 MED ORDER — AMLODIPINE BESYLATE 5 MG PO TABS: 5.0000 mg | ORAL_TABLET | Freq: Every day | ORAL | 1 refills | Status: DC

## 2022-03-23 NOTE — Telephone Encounter (Signed)
Medications have been refilled and pt is aware.  ?

## 2022-03-23 NOTE — Telephone Encounter (Signed)
Pt need a refill on propranolol and amLODipine sent to [facility]

## 2022-03-27 ENCOUNTER — Telehealth: Payer: Self-pay

## 2022-03-27 NOTE — Telephone Encounter (Signed)
Noted.  Agree with the need for the patient to be seen.

## 2022-03-27 NOTE — Telephone Encounter (Signed)
Patient states she has been hoarse for the last two weeks and no fever.  Patient states she would like to speak with Adair Laundry, CMA.  I let patient know that I will send a note to [place].  I offered patient an appointment with one of our nurse practitioners who has an open spot, but patient declined.  Patient states she would like to speak with Janett Billow.

## 2022-03-27 NOTE — Telephone Encounter (Signed)
FYI: I made patient aware that Jessica & Dr. Derrel Nip are out of the office today. She does need to be seen & has to get transportation. Pt will call back if she gets transportation soon, otherwise she will go to urgent care since it is also close by her.  Pt was working in the yard this week & was hoarse yesterday. She does have allergies. Tried otc meds, feels worse today. Denies SOB or chest pain.

## 2022-04-02 DIAGNOSIS — H3411 Central retinal artery occlusion, right eye: Secondary | ICD-10-CM | POA: Diagnosis not present

## 2022-04-02 DIAGNOSIS — H353221 Exudative age-related macular degeneration, left eye, with active choroidal neovascularization: Secondary | ICD-10-CM | POA: Diagnosis not present

## 2022-04-02 DIAGNOSIS — H04122 Dry eye syndrome of left lacrimal gland: Secondary | ICD-10-CM | POA: Diagnosis not present

## 2022-04-06 ENCOUNTER — Other Ambulatory Visit: Payer: Self-pay | Admitting: Internal Medicine

## 2022-04-06 ENCOUNTER — Other Ambulatory Visit: Payer: Self-pay

## 2022-04-06 NOTE — Telephone Encounter (Signed)
Last OV 03-09-22 No future OV

## 2022-04-06 NOTE — Telephone Encounter (Signed)
Prescription Request  04/06/2022  LOV: Visit date not found  What is the name of the medication or equipment?  diazepam (VALIUM) 5 MG tablet    Have you contacted your pharmacy to request a refill? Yes   Which pharmacy would you like this sent to?   [facility] ST[facility] [place], [address] AT [address] [place] [address] Phone: [phone] Fax: [phone]    Patient notified that their request is being sent to the clinical staff for review and that they should receive a response within 2 business days.   Please advise at [place]  Saralyn Pilar is calling from [facility] to state patient needs a refill and their fax machine is not working right now.  Patient states patient is out of this medication.

## 2022-04-07 MED ORDER — DIAZEPAM 5 MG PO TABS: ORAL_TABLET | ORAL | 2 refills | Status: DC

## 2022-04-07 NOTE — Telephone Encounter (Signed)
Patient called about her refill for diazepam (VALIUM) 5 MG table, she is out of her medication.

## 2022-06-08 MED ORDER — DIAZEPAM 5 MG PO TABS: ORAL_TABLET | ORAL | 2 refills | Status: DC

## 2022-06-08 NOTE — Addendum Note (Signed)
Addended by: Sandy Salaam on: 06/08/2022 02:04 PM   Modules accepted: Orders

## 2022-06-08 NOTE — Telephone Encounter (Signed)
Refilled: 04/07/2022 Last OV: 03/09/2022 Next OV: not scheduled

## 2022-06-08 NOTE — Telephone Encounter (Addendum)
Pt need a refill on diazepam and propranolol sent to [facility]

## 2022-06-09 MED ORDER — DIAZEPAM 5 MG PO TABS: ORAL_TABLET | ORAL | 2 refills | Status: DC

## 2022-06-09 NOTE — Telephone Encounter (Signed)
LMTCB. Need to find out what pharmacy pt is using now.

## 2022-06-09 NOTE — Telephone Encounter (Signed)
She needs to select a local pharmacy

## 2022-06-09 NOTE — Telephone Encounter (Signed)
[facility] [address] [facility] [place] [facility]

## 2022-06-09 NOTE — Addendum Note (Signed)
Addended by: Sherlene Shams on: 06/09/2022 03:20 PM   Modules accepted: Orders

## 2022-06-09 NOTE — Telephone Encounter (Signed)
Pt called stating the pharmacy did not receive the diazepam

## 2022-06-09 NOTE — Addendum Note (Signed)
Addended by: Sandy Salaam on: 06/09/2022 04:54 PM   Modules accepted: Orders

## 2022-06-09 NOTE — Telephone Encounter (Signed)
Medication needs to be sent to [facility] in [place]. Pharmacy has been updated in chart.

## 2022-06-09 NOTE — Telephone Encounter (Signed)
Duplicate request for diazepam needs to be refused

## 2022-06-09 NOTE — Telephone Encounter (Signed)
[facility] [place]

## 2022-06-30 ENCOUNTER — Ambulatory Visit (INDEPENDENT_AMBULATORY_CARE_PROVIDER_SITE_OTHER): Payer: Medicare Other | Admitting: *Deleted

## 2022-06-30 VITALS — Ht 66.0 in | Wt 122.0 lb

## 2022-06-30 DIAGNOSIS — Z0000 Encounter for general adult medical examination without abnormal findings: Secondary | ICD-10-CM | POA: Diagnosis not present

## 2022-06-30 NOTE — Patient Instructions (Addendum)
[name] , Thank you for taking time to come for your Medicare Wellness Visit. I appreciate your ongoing commitment to your health goals. Please review the following plan we discussed and let me know if I can assist you in the future.   These are the goals we discussed:  Goals       I try to drink 32 ounces of water daily (pt-stated)      Stay hydrated      Maintain Healthy Lifestyle      Patient Stated      none        This is a list of the screening recommended for you and due dates:  Health Maintenance  Topic Date Due   DTaP/Tdap/Td vaccine (1 - Tdap) Never done   Zoster (Shingles) Vaccine (1 of 2) Never done   Pneumonia Vaccine (1 of 1 - PCV) Never done   COVID-19 Vaccine (2 - Janssen risk series) 07/29/2019   Flu Shot  08/06/2022   Medicare Annual Wellness Visit  06/30/2023   DEXA scan (bone density measurement)  Completed   HPV Vaccine  Aged Out    Advanced directives: on file  Conditions/risks identified: none  Next appointment: Follow up in one year for your annual wellness visit patient declines   Preventive Care 65 Years and Older, Female Preventive care refers to lifestyle choices and visits with your health care provider that can promote health and wellness. What does preventive care include? A yearly physical exam. This is also called an annual well check. Dental exams once or twice a year. Routine eye exams. Ask your health care provider how often you should have your eyes checked. Personal lifestyle choices, including: Daily care of your teeth and gums. Regular physical activity. Eating a healthy diet. Avoiding tobacco and drug use. Limiting alcohol use. Practicing safe sex. Taking low-dose aspirin every day. Taking vitamin and mineral supplements as recommended by your health care provider. What happens during an annual well check? The services and screenings done by your health care provider during your annual well check will depend on your age,  overall health, lifestyle risk factors, and family history of disease. Counseling  Your health care provider may ask you questions about your: Alcohol use. Tobacco use. Drug use. Emotional well-being. Home and relationship well-being. Sexual activity. Eating habits. History of falls. Memory and ability to understand (cognition). Work and work Astronomer. Reproductive health. Screening  You may have the following tests or measurements: Height, weight, and BMI. Blood pressure. Lipid and cholesterol levels. These may be checked every 5 years, or more frequently if you are over 59 years old. Skin check. Lung cancer screening. You may have this screening every year starting at age 81 if you have a 30-pack-year history of smoking and currently smoke or have quit within the past 15 years. Fecal occult blood test (FOBT) of the stool. You may have this test every year starting at age 16. Flexible sigmoidoscopy or colonoscopy. You may have a sigmoidoscopy every 5 years or a colonoscopy every 10 years starting at age 55. Hepatitis C blood test. Hepatitis B blood test. Sexually transmitted disease (STD) testing. Diabetes screening. This is done by checking your blood sugar (glucose) after you have not eaten for a while (fasting). You may have this done every 1-3 years. Bone density scan. This is done to screen for osteoporosis. You may have this done starting at age 47. Mammogram. This may be done every 1-2 years. Talk to your health  care provider about how often you should have regular mammograms. Talk with your health care provider about your test results, treatment options, and if necessary, the need for more tests. Vaccines  Your health care provider may recommend certain vaccines, such as: Influenza vaccine. This is recommended every year. Tetanus, diphtheria, and acellular pertussis (Tdap, Td) vaccine. You may need a Td booster every 10 years. Zoster vaccine. You may need this after age  58. Pneumococcal 13-valent conjugate (PCV13) vaccine. One dose is recommended after age 71. Pneumococcal polysaccharide (PPSV23) vaccine. One dose is recommended after age 28. Talk to your health care provider about which screenings and vaccines you need and how often you need them. This information is not intended to replace advice given to you by your health care provider. Make sure you discuss any questions you have with your health care provider. Document Released: 01/18/2015 Document Revised: 09/11/2015 Document Reviewed: 10/23/2014 Elsevier Interactive Patient Education  2017 [employer].  Fall Prevention in the Home Falls can cause injuries. They can happen to people of all ages. There are many things you can do to make your home safe and to help prevent falls. What can I do on the outside of my home? Regularly fix the edges of walkways and driveways and fix any cracks. Remove anything that might make you trip as you walk through a door, such as a raised step or threshold. Trim any bushes or trees on the path to your home. Use bright outdoor lighting. Clear any walking paths of anything that might make someone trip, such as rocks or tools. Regularly check to see if handrails are loose or broken. Make sure that both sides of any steps have handrails. Any raised decks and porches should have guardrails on the edges. Have any leaves, snow, or ice cleared regularly. Use sand or salt on walking paths during winter. Clean up any spills in your garage right away. This includes oil or grease spills. What can I do in the bathroom? Use night lights. Install grab bars by the toilet and in the tub and shower. Do not use towel bars as grab bars. Use non-skid mats or decals in the tub or shower. If you need to sit down in the shower, use a plastic, non-slip stool. Keep the floor dry. Clean up any water that spills on the floor as soon as it happens. Remove soap buildup in the tub or shower  regularly. Attach bath mats securely with double-sided non-slip rug tape. Do not have throw rugs and other things on the floor that can make you trip. What can I do in the bedroom? Use night lights. Make sure that you have a light by your bed that is easy to reach. Do not use any sheets or blankets that are too big for your bed. They should not hang down onto the floor. Have a firm chair that has side arms. You can use this for support while you get dressed. Do not have throw rugs and other things on the floor that can make you trip. What can I do in the kitchen? Clean up any spills right away. Avoid walking on wet floors. Keep items that you use a lot in easy-to-reach places. If you need to reach something above you, use a strong step stool that has a grab bar. Keep electrical cords out of the way. Do not use floor polish or wax that makes floors slippery. If you must use wax, use non-skid floor wax. Do not have throw  rugs and other things on the floor that can make you trip. What can I do with my stairs? Do not leave any items on the stairs. Make sure that there are handrails on both sides of the stairs and use them. Fix handrails that are broken or loose. Make sure that handrails are as long as the stairways. Check any carpeting to make sure that it is firmly attached to the stairs. Fix any carpet that is loose or worn. Avoid having throw rugs at the top or bottom of the stairs. If you do have throw rugs, attach them to the floor with carpet tape. Make sure that you have a light switch at the top of the stairs and the bottom of the stairs. If you do not have them, ask someone to add them for you. What else can I do to help prevent falls? Wear shoes that: Do not have high heels. Have rubber bottoms. Are comfortable and fit you well. Are closed at the toe. Do not wear sandals. If you use a stepladder: Make sure that it is fully opened. Do not climb a closed stepladder. Make sure that  both sides of the stepladder are locked into place. Ask someone to hold it for you, if possible. Clearly mark and make sure that you can see: Any grab bars or handrails. First and last steps. Where the edge of each step is. Use tools that help you move around (mobility aids) if they are needed. These include: Canes. Walkers. Scooters. Crutches. Turn on the lights when you go into a dark area. Replace any light bulbs as soon as they burn out. Set up your furniture so you have a clear path. Avoid moving your furniture around. If any of your floors are uneven, fix them. If there are any pets around you, be aware of where they are. Review your medicines with your doctor. Some medicines can make you feel dizzy. This can increase your chance of falling. Ask your doctor what other things that you can do to help prevent falls. This information is not intended to replace advice given to you by your health care provider. Make sure you discuss any questions you have with your health care provider. Document Released: 10/18/2008 Document Revised: 05/30/2015 Document Reviewed: 01/26/2014 Elsevier Interactive Patient Education  2017 [employer].  .a

## 2022-06-30 NOTE — Progress Notes (Cosign Needed)
Subjective:   [name] is a 89 y.o. female who presents for Medicare Annual (Subsequent) preventive examination.  Visit Complete: Virtual  I connected with  [name] on 06/30/22 by a video and audio enabled telemedicine application and verified that I am speaking with the correct person using two identifiers.  Patient Location: Home  Provider Location: Office/Clinic  I discussed the limitations of evaluation and management by telemedicine. The patient expressed understanding and agreed to proceed.   all information answered by patient is correct and no changes since this date.  Review of Systems     Cardiac Risk Factors include: advanced age (>45men, >21 women);dyslipidemia;hypertension;Other (see comment), Risk factor comments: History of CVA     Objective:    Today's Vitals   06/30/22 1402  Weight: 122 lb (55.3 kg)  Height: 5\' 6"  (1.676 m)   Body mass index is 19.69 kg/m.     06/30/2022    2:27 PM 06/30/2021    6:57 PM 06/29/2021   10:27 AM 06/29/2021    9:05 AM 05/29/2021    8:44 AM 12/23/2020    8:18 AM 09/02/2020    9:22 AM  Advanced Directives  Does Patient Have a Medical Advance Directive? Yes No Yes No;Yes Yes Yes Yes  Type of Estate agent of [place];Living will  Healthcare Power of [place];Living will  Healthcare Power of [place];Living will Healthcare Power of [place];Living will Healthcare Power of [place];Living will  Does patient want to make changes to medical advance directive? No - Patient declined    No - Patient declined  No - Patient declined  Copy of Healthcare Power of Attorney in Chart? Yes - validated most recent copy scanned in chart (See row information)    Yes - validated most recent copy scanned in chart (See row information)  No - copy requested    Current Medications (verified) Outpatient Encounter Medications as of 06/30/2022  Medication Sig   amLODipine (NORVASC) 5 MG tablet Take 1 tablet (5 mg total)  by mouth daily.   Calcium Carb-Cholecalciferol (CALCIUM 600/VITAMIN D3 PO) Take 1 tablet by mouth daily.   diazepam (VALIUM) 5 MG tablet TAKE 1 TABLET BY MOUTH EVERY DAY AS NEEDED FOR ANXIETY OR MUSCLE SPASMS   FIBER PO Take 1 capsule by mouth in the morning, at noon, and at bedtime.   FIBERCON 625 MG tablet Take 625 mg by mouth daily.   Lutein 20 MG CAPS Take 20 mg by mouth daily.   propranolol ER (INDERAL LA) 60 MG 24 hr capsule TAKE 1 CAPSULE BY MOUTH EVERY DAY   Propylene Glycol (SYSTANE BALANCE OP) Place 1 drop into both eyes 4 (four) times daily as needed (dry eyes).   sennosides-docusate sodium (SENOKOT-S) 8.6-50 MG tablet Take 1 tablet by mouth daily as needed for constipation.   Vitamin E 400 units TABS Take 1 tablet by mouth daily.   No facility-administered encounter medications on file as of 06/30/2022.    Allergies (verified) Atorvastatin and Flomax [tamsulosin]   History: Past Medical History:  Diagnosis Date   Anxiety    Arthritis    "may have a touch; all over I guess" (10/11/2017)   Back pain    "all my back" (10/11/2017)   Basal cell carcinoma    "have had many taken off; burned" (10/11/2017)   Chronic orthostatic hypotension 06/2010   with pprior syncopal event,  did not tolelrate florinef trial   Complication of anesthesia    2019 ws very  confused for several days after anesthesia   Constipation 08/12/2013   Depression    Hyperlipidemia    Hypertension    Tibia/fibula fracture 10/09/2017   Urinary frequency    Past Surgical History:  Procedure Laterality Date   ANKLE FRACTURE SURGERY Left 1998   CATARACT EXTRACTION W/ INTRAOCULAR LENS  IMPLANT, BILATERAL Bilateral    DILATION AND CURETTAGE OF UTERUS     FRACTURE SURGERY     IM NAILING TIBIA Left 10/10/2017   LAPAROSCOPIC BILATERAL SALPINGO OOPHERECTOMY Bilateral 09/02/2020   Procedure: LAPAROSCOPIC BILATERAL SALPINGO OOPHORECTOMY;  Surgeon: Schermerhorn, Ihor Austin, MD;  Location: [facility] ORS;  Service:  Gynecology;  Laterality: Bilateral;   TIBIA IM NAIL INSERTION Left 10/10/2017   Procedure: INTRAMEDULLARY (IM) NAIL TIBIAL;  Surgeon: Tarry Kos, MD;  Location: [facility] OR;  Service: Orthopedics;  Laterality: Left;   WRIST FRACTURE SURGERY Right 2005   Family History  Problem Relation Age of Onset   Colon cancer Mother    Heart disease Father    Kidney disease Father    Cancer Neg Hx    Diabetes Neg Hx    Social History   Socioeconomic History   Marital status: Widowed    Spouse name: Not on file   Number of children: Not on file   Years of education: Not on file   Highest education level: Not on file  Occupational History   Occupation: retired    Associate Professor: RETIRED    Comment: Museum/gallery exhibitions officer  Tobacco Use   Smoking status: Never   Smokeless tobacco: Never  Building services engineer Use: Never used  Substance and Sexual Activity   Alcohol use: Never   Drug use: Never   Sexual activity: Not Currently  Other Topics Concern   Not on file  Social History Narrative   Not on file   Social Determinants of Health   Financial Resource Strain: Low Risk  (06/30/2022)   Overall Financial Resource Strain (CARDIA)    Difficulty of Paying Living Expenses: Not hard at all  Food Insecurity: No Food Insecurity (06/30/2022)   Hunger Vital Sign    Worried About Running Out of Food in the Last Year: Never true    Ran Out of Food in the Last Year: Never true  Transportation Needs: No Transportation Needs (06/30/2022)   PRAPARE - Administrator, Civil Service (Medical): No    Lack of Transportation (Non-Medical): No  Physical Activity: Patient Declined (06/30/2022)   Exercise Vital Sign    Days of Exercise per Week: Patient declined    Minutes of Exercise per Session: Patient declined  Stress: No Stress Concern Present (06/30/2022)   [employer] of Occupational Health - Occupational Stress Questionnaire    Feeling of Stress : Not at all  Social Connections:  Moderately Integrated (06/30/2022)   Social Connection and Isolation Panel [NHANES]    Frequency of Communication with Friends and Family: More than three times a week    Frequency of Social Gatherings with Friends and Family: More than three times a week    Attends Religious Services: More than 4 times per year    Active Member of [employer] or Organizations: No    Attends Engineer, structural: More than 4 times per year    Marital Status: Widowed    Tobacco Counseling Counseling given: Not Answered   Clinical Intake:  Pre-visit preparation completed: Yes  Pain : No/denies pain     BMI - recorded: 19.69 Nutritional  Risks: None Diabetes: No  How often do you need to have someone help you when you read instructions, pamphlets, or other written materials from your doctor or pharmacy?: 1 - Never  Interpreter Needed?: No  Information entered by :: R. Aariel Ems LPN   Activities of Daily Living    06/30/2022    2:09 PM  In your present state of health, do you have any difficulty performing the following activities:  Hearing? 0  Comment no issues  Vision? 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 0  Walking or climbing stairs? 1  Dressing or bathing? 0  Doing errands, shopping? 1  Comment niece does Psychologist, clinical and eating ? N  Using the Toilet? N  In the past six months, have you accidently leaked urine? N  Do you have problems with loss of bowel control? N  Managing your Medications? N  Managing your Finances? N  Housekeeping or managing your Housekeeping? N    Patient Care Team: Sherlene Shams, MD as PCP - General (Internal Medicine)  Indicate any recent Medical Services you may have received from other than [facility] providers in the past year (date may be approximate).     Assessment:   This is a routine wellness examination for Keali.  Hearing/Vision screen Hearing Screening - Comments:: No issues Vision  Screening - Comments:: Blind in right eye  Dietary issues and exercise activities discussed:     Goals Addressed             This Visit's Progress    Patient Stated       none      Depression Screen    06/30/2022    2:18 PM 03/09/2022    3:24 PM 07/07/2021   11:09 AM 06/06/2021    1:13 PM 05/29/2021    8:43 AM 12/11/2020   11:17 AM 11/01/2020    1:36 PM  PHQ 2/9 Scores  PHQ - 2 Score 0 0 0 0 0 0 0  PHQ- 9 Score 2          Fall Risk    06/30/2022    2:13 PM 03/09/2022    3:24 PM 08/13/2021   11:39 AM 07/07/2021   11:09 AM 06/06/2021    1:12 PM  Fall Risk   Falls in the past year? 0 0 0 0 0  Number falls in past yr: 0 0   0  Injury with Fall? 0 0   0  Risk for fall due to : No Fall Risks No Fall Risks Impaired balance/gait No Fall Risks No Fall Risks  Follow up Falls prevention discussed;Falls evaluation completed;Education provided Falls evaluation completed Falls evaluation completed Falls evaluation completed Falls evaluation completed    MEDICARE RISK AT HOME:  Medicare Risk at Home - 06/30/22 1413     Any stairs in or around the home? Yes   has chair lift   If so, are there any without handrails? No    Home free of loose throw rugs in walkways, pet beds, electrical cords, etc? Yes    Adequate lighting in your home to reduce risk of falls? Yes    Life alert? Yes    Use of a cane, walker or w/c? Yes   uses a cane sometimes not often   Grab bars in the bathroom? No    Shower chair or bench in shower? No    Elevated toilet seat or a handicapped toilet? No  Cognitive Function:        06/30/2022    2:28 PM 05/27/2020    4:10 PM 05/25/2019   12:01 PM  6CIT Screen  What Year? 0 points 0 points 0 points  What month? 0 points 0 points 0 points  What time? 0 points 0 points 0 points  Count back from 20 2 points 0 points 0 points  Months in reverse 0 points 0 points 0 points  Repeat phrase 2 points    Total Score 4 points       Immunizations Immunization History  Administered Date(s) Administered   Janssen (J&J) SARS-COV-2 Vaccination 07/01/2019    TDAP status: Due, Education has been provided regarding the importance of this vaccine. Advised may receive this vaccine at local pharmacy or Health Dept. Aware to provide a copy of the vaccination record if obtained from local pharmacy or Health Dept. Verbalized acceptance and understanding.  Flu Vaccine status: Declined, Education has been provided regarding the importance of this vaccine but patient still declined. Advised may receive this vaccine at local pharmacy or Health Dept. Aware to provide a copy of the vaccination record if obtained from local pharmacy or Health Dept. Verbalized acceptance and understanding.  Pneumococcal vaccine status: Declined,  Education has been provided regarding the importance of this vaccine but patient still declined. Advised may receive this vaccine at local pharmacy or Health Dept. Aware to provide a copy of the vaccination record if obtained from local pharmacy or Health Dept. Verbalized acceptance and understanding.   Covid-19 vaccine status: Declined, Education has been provided regarding the importance of this vaccine but patient still declined. Advised may receive this vaccine at local pharmacy or Health Dept.or vaccine clinic. Aware to provide a copy of the vaccination record if obtained from local pharmacy or Health Dept. Verbalized acceptance and understanding.  Qualifies for Shingles Vaccine? Yes   Zostavax completed No   Shingrix Completed?: No.    Education has been provided regarding the importance of this vaccine. Patient has been advised to call insurance company to determine out of pocket expense if they have not yet received this vaccine. Advised may also receive vaccine at local pharmacy or Health Dept. Verbalized acceptance and understanding.  Screening Tests Health Maintenance  Topic Date Due   DTaP/Tdap/Td (1  - Tdap) Never done   Zoster Vaccines- Shingrix (1 of 2) Never done   Pneumonia Vaccine 61+ Years old (1 of 1 - PCV) Never done   COVID-19 Vaccine (2 - Janssen risk series) 07/29/2019   INFLUENZA VACCINE  08/06/2022   Medicare Annual Wellness (AWV)  06/30/2023   DEXA SCAN  Completed   HPV VACCINES  Aged Out    Health Maintenance  Health Maintenance Due  Topic Date Due   DTaP/Tdap/Td (1 - Tdap) Never done   Zoster Vaccines- Shingrix (1 of 2) Never done   Pneumonia Vaccine 7+ Years old (1 of 1 - PCV) Never done   COVID-19 Vaccine (2 - Janssen risk series) 07/29/2019    Colorectal cancer screening: No longer required.   Mammogram status: No longer required due to age.    Lung Cancer Screening: (Low Dose CT Chest recommended if Age 22-80 years, 20 pack-year currently smoking OR have quit w/in 15years.) does not qualify.    Additional Screening:  Hepatitis C Screening: does not qualify; Completed no record  Vision Screening: Recommended annual ophthalmology exams for early detection of glaucoma and other disorders of the eye. Is the patient up to date with  their annual eye exam?  Yes  Who is the provider or what is the name of the office in which the patient attends annual eye exams? Doctor in [place] If pt is not established with a provider, would they like to be referred to a provider to establish care? No .   Dental Screening: Recommended annual dental exams for proper oral hygiene    Community Resource Referral / Chronic Care Management: CRR required this visit?  No   CCM required this visit?  No     Plan:     I have personally reviewed and noted the following in the patient's chart:   Medical and social history Use of alcohol, tobacco or illicit drugs  Current medications and supplements including opioid prescriptions. Patient is not currently taking opioid prescriptions. Functional ability and status Nutritional status Physical activity Advanced  directives List of other physicians Hospitalizations, surgeries, and ER visits in previous 12 months Vitals Screenings to include cognitive, depression, and falls Referrals and appointments  In addition, I have reviewed and discussed with patient certain preventive protocols, quality metrics, and best practice recommendations. A written personalized care plan for preventive services as well as general preventive health recommendations were provided to patient.     Sydell Axon, LPN   [id number]   After Visit Summary: (Declined) Due to this being a telephonic visit, with patients personalized plan was offered to patient but patient Declined AVS at this time   Nurse Notes: Patient does not want to do AWV in the future   I have reviewed the above information and agree with above.   Duncan Dull, MD

## 2022-07-01 ENCOUNTER — Telehealth: Payer: Self-pay | Admitting: Internal Medicine

## 2022-07-01 NOTE — Telephone Encounter (Signed)
Pt called in stating that she would like for you to call her back.

## 2022-07-03 NOTE — Telephone Encounter (Signed)
Called patient and was advised that she wanted to apologized for her behavior the other day during her AWV. Patient stated that she does not know what got into her and caused her to act that way with me Patient was advised not to worry about it and  that everyone has bad days. Patient stated that she was so glad that I called her back so that she could talk to me. Patient was advised not to think anything of it and we are good.

## 2022-07-23 ENCOUNTER — Telehealth: Payer: Self-pay | Admitting: Internal Medicine

## 2022-07-23 NOTE — Telephone Encounter (Signed)
Pt called in to let Shanda Bumps CMA know that from now on all of her meds are going to [facility] in [place].

## 2022-07-23 NOTE — Telephone Encounter (Signed)
Pharmacy has been updated.

## 2022-08-19 DIAGNOSIS — S0101XA Laceration without foreign body of scalp, initial encounter: Secondary | ICD-10-CM | POA: Diagnosis not present

## 2022-08-19 DIAGNOSIS — S0003XA Contusion of scalp, initial encounter: Secondary | ICD-10-CM | POA: Diagnosis not present

## 2022-08-19 DIAGNOSIS — M47812 Spondylosis without myelopathy or radiculopathy, cervical region: Secondary | ICD-10-CM | POA: Diagnosis not present

## 2022-08-19 DIAGNOSIS — N3000 Acute cystitis without hematuria: Secondary | ICD-10-CM | POA: Diagnosis not present

## 2022-08-19 DIAGNOSIS — E785 Hyperlipidemia, unspecified: Secondary | ICD-10-CM | POA: Diagnosis not present

## 2022-08-19 DIAGNOSIS — Z888 Allergy status to other drugs, medicaments and biological substances status: Secondary | ICD-10-CM | POA: Diagnosis not present

## 2022-08-19 DIAGNOSIS — S199XXA Unspecified injury of neck, initial encounter: Secondary | ICD-10-CM | POA: Diagnosis not present

## 2022-08-19 DIAGNOSIS — M4312 Spondylolisthesis, cervical region: Secondary | ICD-10-CM | POA: Diagnosis not present

## 2022-08-19 DIAGNOSIS — Z79899 Other long term (current) drug therapy: Secondary | ICD-10-CM | POA: Diagnosis not present

## 2022-08-19 DIAGNOSIS — I6529 Occlusion and stenosis of unspecified carotid artery: Secondary | ICD-10-CM | POA: Diagnosis not present

## 2022-08-20 ENCOUNTER — Telehealth: Payer: Self-pay | Admitting: Internal Medicine

## 2022-08-26 ENCOUNTER — Encounter: Payer: Self-pay | Admitting: Family Medicine

## 2022-08-26 ENCOUNTER — Ambulatory Visit (INDEPENDENT_AMBULATORY_CARE_PROVIDER_SITE_OTHER): Payer: Medicare Other | Admitting: Family Medicine

## 2022-08-26 VITALS — BP 116/66 | HR 69 | Temp 97.8°F | Ht 66.0 in | Wt 122.4 lb

## 2022-08-26 DIAGNOSIS — S0101XA Laceration without foreign body of scalp, initial encounter: Secondary | ICD-10-CM

## 2022-08-26 DIAGNOSIS — N390 Urinary tract infection, site not specified: Secondary | ICD-10-CM | POA: Insufficient documentation

## 2022-08-26 DIAGNOSIS — N3001 Acute cystitis with hematuria: Secondary | ICD-10-CM

## 2022-08-26 HISTORY — DX: Laceration without foreign body of scalp, initial encounter: S01.01XA

## 2022-08-26 NOTE — Assessment & Plan Note (Signed)
Well-healing.  Area was cleansed with an alcohol swab.  A single staple was removed.  Advised to monitor for signs of infection and if those occur she should let us know immediately.

## 2022-08-26 NOTE — Progress Notes (Signed)
Marikay Alar, MD Phone: [phone]  [name] is a 89 y.o. female who presents today for follow-up.  Fall/head injury: Patient had a fall 1 week ago.  She was walking out in her carport and just felt funny and turned around to go inside and fell down.  She has trouble describing how she felt though notes it was not a dizzy sensation.  She notes no loss of consciousness.  She went to the emergency room and had a 1 cm laceration at the top of her occiput region and had 1 staple placed.  She notes no headaches, vision changes, numbness, or weakness.  They did diagnose her with a UTI based on urinalysis results.  No culture was done.  Patient finished antibiotics yesterday.  She noted no previous or current dysuria, frequency, or urgency.  No acute changes on CT head or CT cervical spine.  Social History   Tobacco Use  Smoking Status Never  Smokeless Tobacco Never    Current Outpatient Medications on File Prior to Visit  Medication Sig Dispense Refill   amLODipine (NORVASC) 5 MG tablet Take 1 tablet (5 mg total) by mouth daily. 90 tablet 1   Calcium Carb-Cholecalciferol (CALCIUM 600/VITAMIN D3 PO) Take 1 tablet by mouth daily.     diazepam (VALIUM) 5 MG tablet TAKE 1 TABLET BY MOUTH EVERY DAY AS NEEDED FOR ANXIETY OR MUSCLE SPASMS 30 tablet 2   FIBER PO Take 1 capsule by mouth in the morning, at noon, and at bedtime.     FIBERCON 625 MG tablet Take 625 mg by mouth daily.     Lutein 20 MG CAPS Take 20 mg by mouth daily.     propranolol ER (INDERAL LA) 60 MG 24 hr capsule TAKE 1 CAPSULE BY MOUTH EVERY DAY 90 capsule 1   Propylene Glycol (SYSTANE BALANCE OP) Place 1 drop into both eyes 4 (four) times daily as needed (dry eyes).     sennosides-docusate sodium (SENOKOT-S) 8.6-50 MG tablet Take 1 tablet by mouth daily as needed for constipation.     Vitamin E 400 units TABS Take 1 tablet by mouth daily.     No current facility-administered medications on file prior to visit.     ROS  see history of present illness  Objective  Physical Exam There were no vitals filed for this visit.  BP Readings from Last 3 Encounters:  03/09/22 (!) 144/90  08/13/21 (!) 144/72  07/07/21 138/80   Wt Readings from Last 3 Encounters:  06/30/22 122 lb (55.3 kg)  03/09/22 122 lb 3.2 oz (55.4 kg)  08/13/21 119 lb 12.8 oz (54.3 kg)    Physical Exam HENT:     Head:       Assessment/Plan: Please see individual problem list.  Laceration of scalp, initial encounter Assessment & Plan: Well-healing.  Area was cleansed with an alcohol swab.  A single staple was removed.  Advised to monitor for signs of infection and if those occur she should let us know immediately.   Acute cystitis with hematuria Assessment & Plan: Asymptomatic.  Given lack of symptoms this begs the question of whether or not she actually had a true UTI or if she had asymptomatic bacteriuria.  Given that she was treated with an antibiotic we will have her return for follow-up urinalysis in 3 to 4 weeks to determine if there is still blood in her urine.  Orders: -     Urinalysis, Routine w reflex microscopic; Future  Return in about 4 weeks (around 09/23/2022) for Urine recheck on lab.   Marikay Alar, MD [facility]

## 2022-08-26 NOTE — Assessment & Plan Note (Signed)
Asymptomatic.  Given lack of symptoms this begs the question of whether or not she actually had a true UTI or if she had asymptomatic bacteriuria.  Given that she was treated with an antibiotic we will have her return for follow-up urinalysis in 3 to 4 weeks to determine if there is still blood in her urine.

## 2022-08-29 IMAGING — US US PELVIS LIMITED
1 series · 14 of 22 positions shown · non-contrast
Comparison: 10/16/2020, 06/13/2019

CLINICAL DATA: Urinary retention, hesitancy

EXAM:
LIMITED ULTRASOUND OF PELVIS
TECHNIQUE: Limited transabdominal ultrasound examination of the pelvis was
performed.

[Series 1: us pelvis limited (transabdominal only) · 14 of 22 slices shown]
[im 1/22]
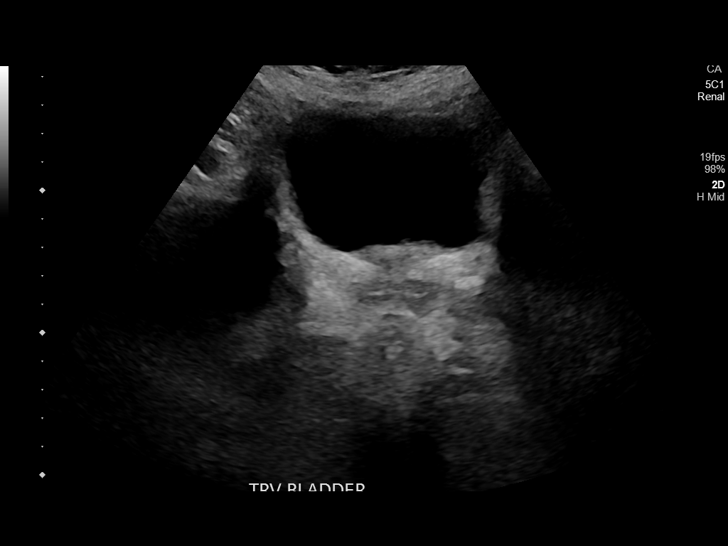
[im 3/22]
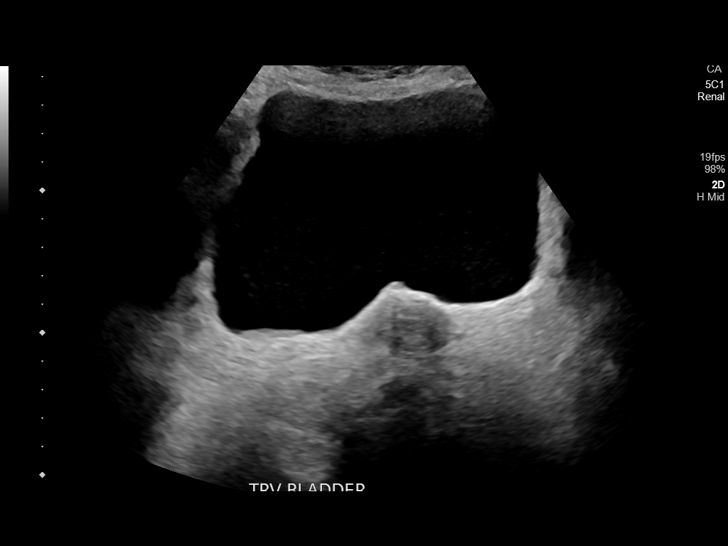
[im 4/22]
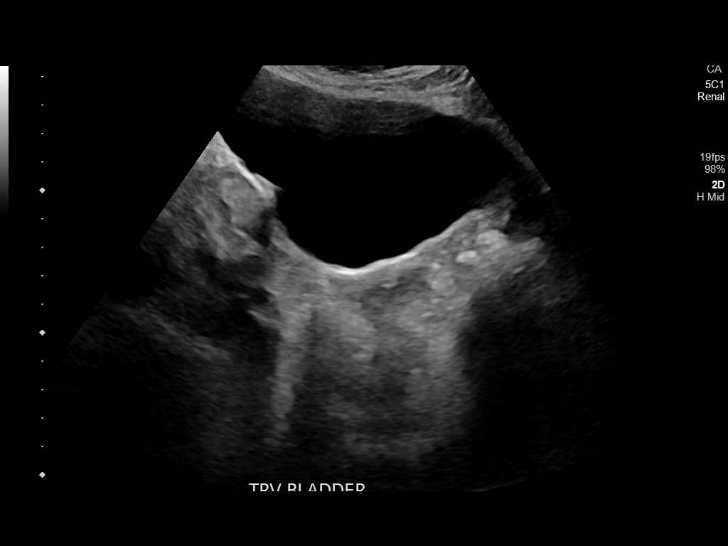
[im 6/22]
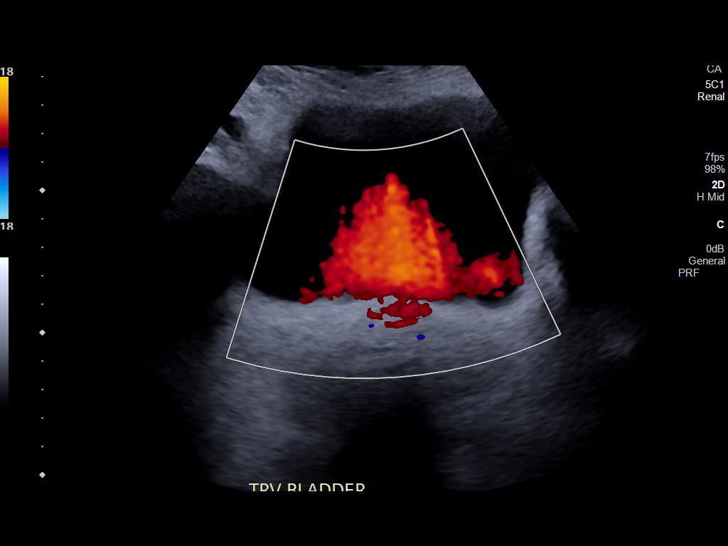
[im 8/22]
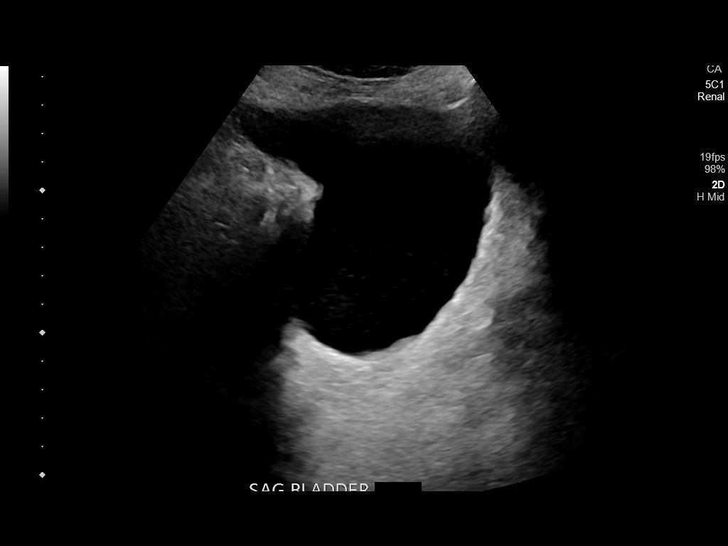
[im 9/22]
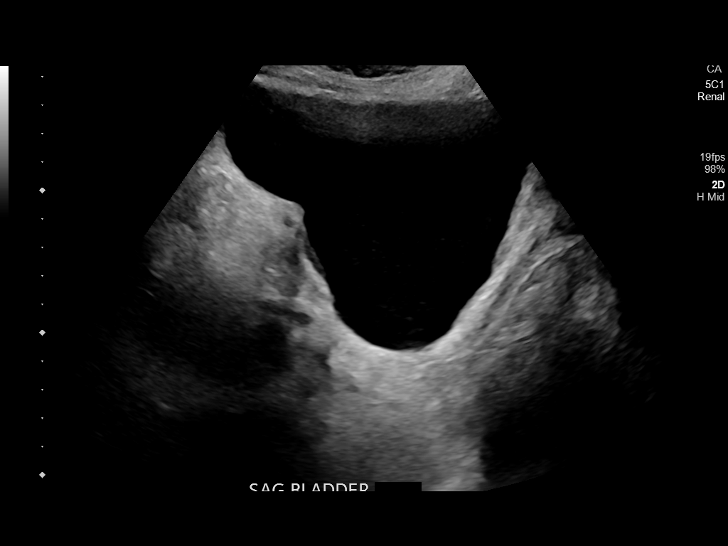
[im 11/22]
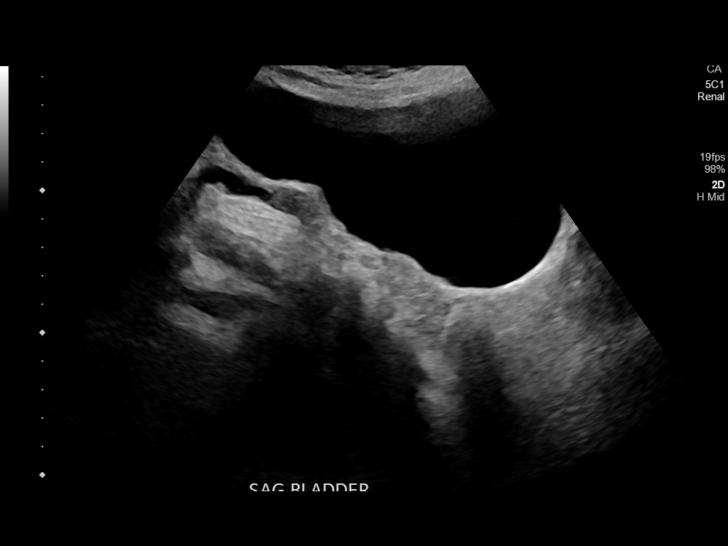
[im 12/22]
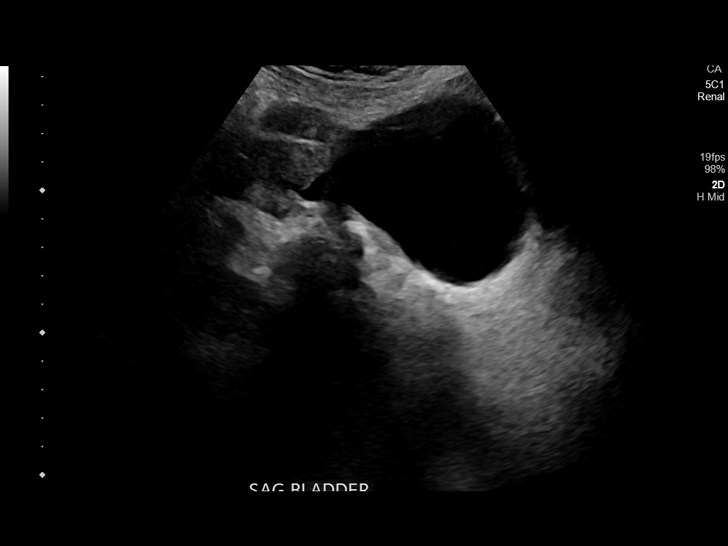
[im 14/22]
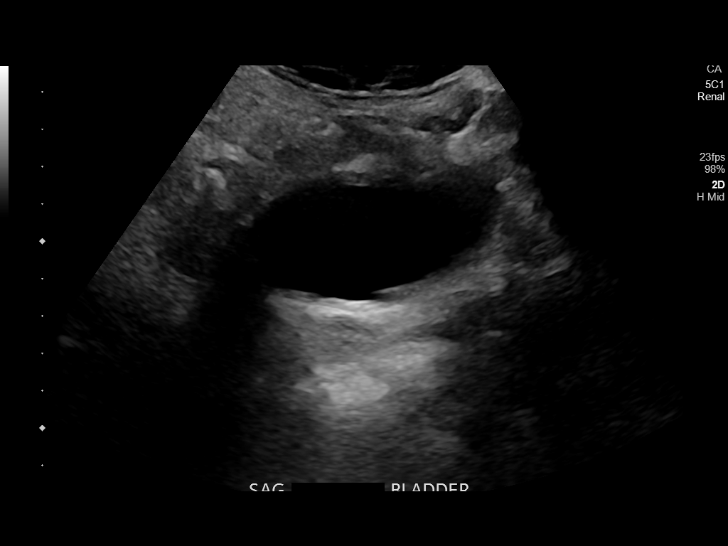
[im 15/22]
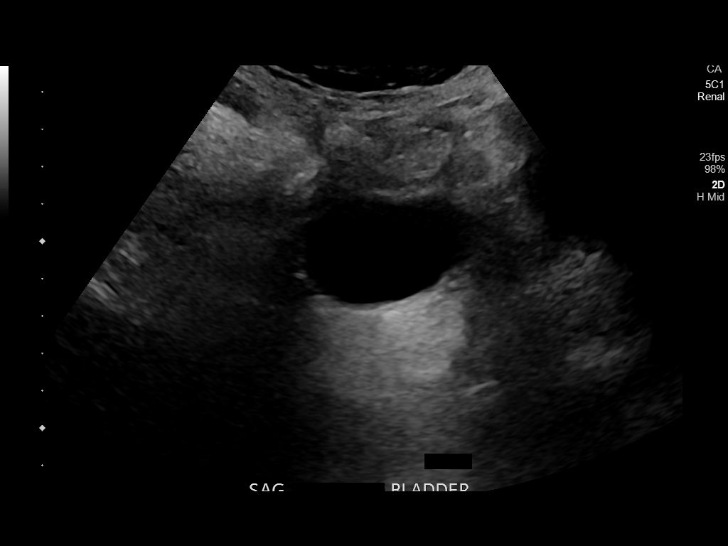
[im 17/22]
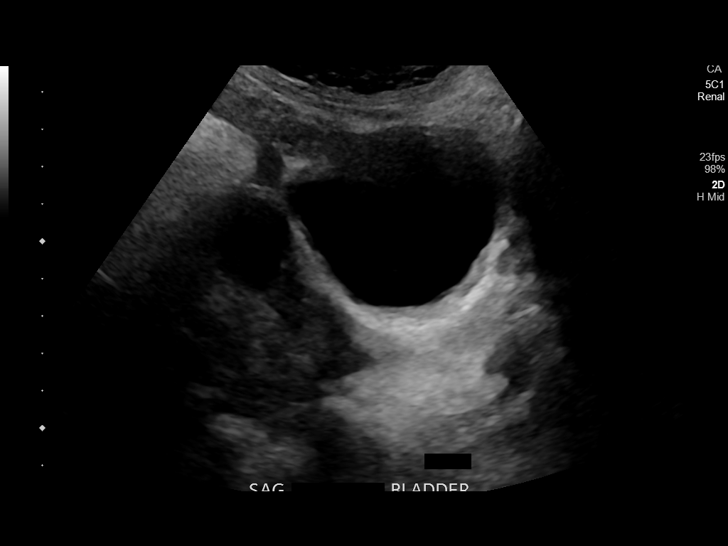
[im 19/22]
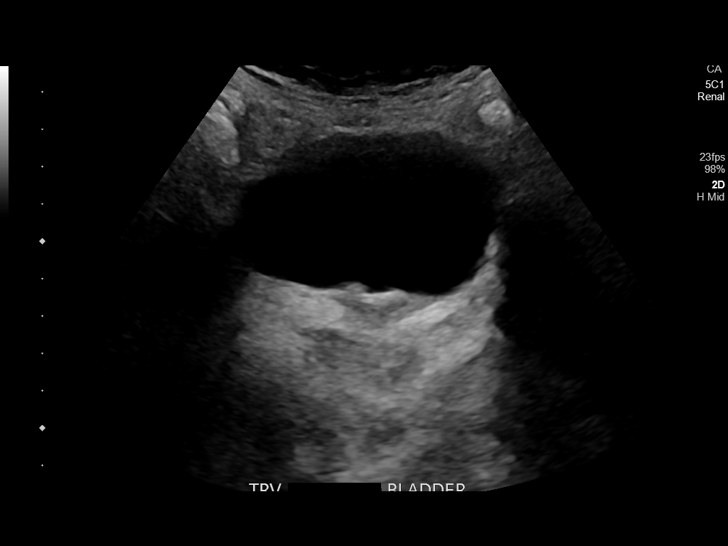
[im 20/22]
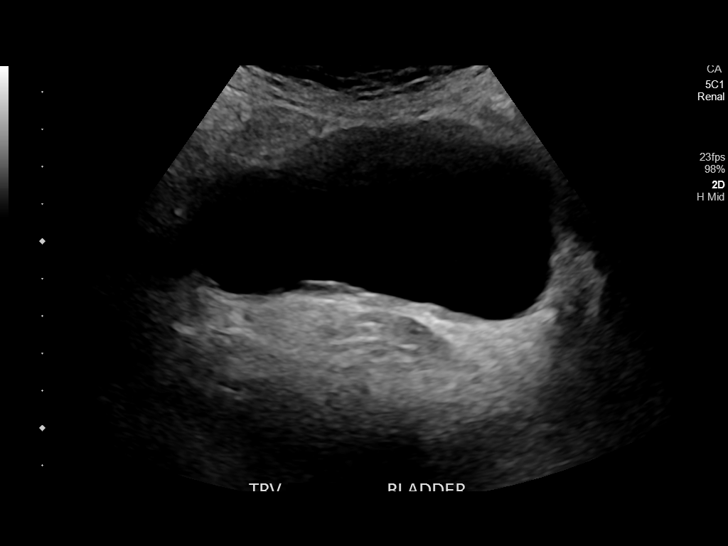
[im 22/22]
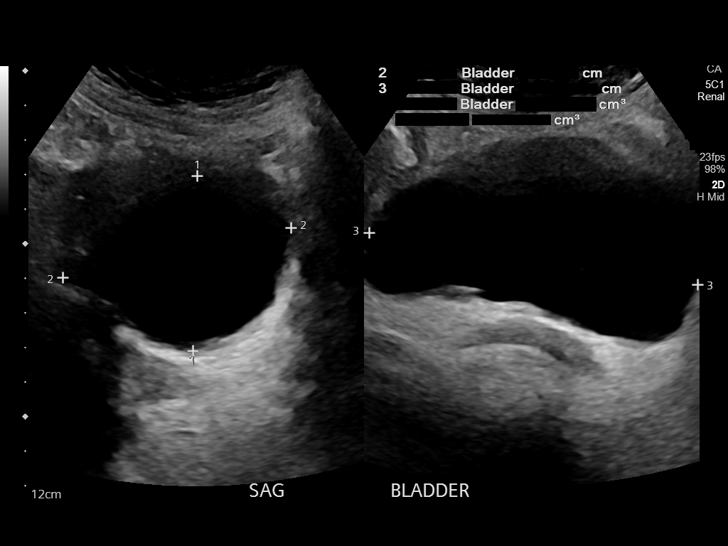

[14 of 22 positions shown; findings below may reference images not displayed]

FINDINGS: The urinary bladder has a normal appearance for the degree of
distention.

Pre-void volume: 633 mL

Post-void volume: 172 mL

Other findings:  None.
IMPRESSION: 1. Moderate postvoid residual measuring 172 cc. Otherwise
unremarkable bladder.

## 2022-09-01 DIAGNOSIS — H3411 Central retinal artery occlusion, right eye: Secondary | ICD-10-CM | POA: Diagnosis not present

## 2022-09-01 DIAGNOSIS — H353221 Exudative age-related macular degeneration, left eye, with active choroidal neovascularization: Secondary | ICD-10-CM | POA: Diagnosis not present

## 2022-09-02 DIAGNOSIS — Z86006 Personal history of melanoma in-situ: Secondary | ICD-10-CM | POA: Diagnosis not present

## 2022-09-02 DIAGNOSIS — Z85828 Personal history of other malignant neoplasm of skin: Secondary | ICD-10-CM | POA: Diagnosis not present

## 2022-09-02 DIAGNOSIS — L821 Other seborrheic keratosis: Secondary | ICD-10-CM | POA: Diagnosis not present

## 2022-09-02 DIAGNOSIS — Z08 Encounter for follow-up examination after completed treatment for malignant neoplasm: Secondary | ICD-10-CM | POA: Diagnosis not present

## 2022-09-02 DIAGNOSIS — Z85820 Personal history of malignant melanoma of skin: Secondary | ICD-10-CM | POA: Diagnosis not present

## 2022-09-02 DIAGNOSIS — L570 Actinic keratosis: Secondary | ICD-10-CM | POA: Diagnosis not present

## 2022-09-07 ENCOUNTER — Other Ambulatory Visit: Payer: Self-pay | Admitting: Internal Medicine

## 2022-09-08 ENCOUNTER — Telehealth: Payer: Self-pay | Admitting: Internal Medicine

## 2022-09-08 MED ORDER — PROPRANOLOL HCL ER 60 MG PO CP24: ORAL_CAPSULE | ORAL | 1 refills | Status: DC

## 2022-09-08 MED ORDER — AMLODIPINE BESYLATE 5 MG PO TABS: 5.0000 mg | ORAL_TABLET | Freq: Every day | ORAL | 1 refills | Status: DC

## 2022-09-08 NOTE — Telephone Encounter (Signed)
Requesting: Diazepam Contract: No UDS: No Last Visit: 03/09/2022 Next Visit: 09/16/2022 Last Refill: 06/09/2022  Please Advise

## 2022-09-08 NOTE — Telephone Encounter (Signed)
Medication sent to pharmacy , pt was notified

## 2022-09-08 NOTE — Telephone Encounter (Signed)
Prescription Request  09/08/2022  LOV: 03/09/2022  What is the name of the medication or equipment? Propranol and amlodipine  Have you contacted your pharmacy to request a refill? No   Which pharmacy would you like this sent to? [facility]   Patient notified that their request is being sent to the clinical staff for review and that they should receive a response within 2 business days.   Please advise at [facility] [phone]

## 2022-09-16 ENCOUNTER — Ambulatory Visit (INDEPENDENT_AMBULATORY_CARE_PROVIDER_SITE_OTHER): Payer: Medicare Other | Admitting: Internal Medicine

## 2022-09-16 ENCOUNTER — Encounter: Payer: Self-pay | Admitting: Internal Medicine

## 2022-09-16 VITALS — BP 112/80 | HR 60 | Ht 66.0 in | Wt 122.8 lb

## 2022-09-16 DIAGNOSIS — S0101XS Laceration without foreign body of scalp, sequela: Secondary | ICD-10-CM

## 2022-09-16 DIAGNOSIS — N3090 Cystitis, unspecified without hematuria: Secondary | ICD-10-CM

## 2022-09-16 DIAGNOSIS — N3001 Acute cystitis with hematuria: Secondary | ICD-10-CM | POA: Diagnosis not present

## 2022-09-16 DIAGNOSIS — F5104 Psychophysiologic insomnia: Secondary | ICD-10-CM | POA: Diagnosis not present

## 2022-09-16 MED ORDER — DIAZEPAM 5 MG PO TABS: ORAL_TABLET | ORAL | 5 refills | Status: DC

## 2022-09-16 MED ORDER — TRAZODONE HCL 50 MG PO TABS: 50.0000 mg | ORAL_TABLET | Freq: Every evening | ORAL | 5 refills | Status: DC | PRN

## 2022-09-16 NOTE — Patient Instructions (Addendum)
Taking an antibiotic (like cephalexin ) for a UTI  can create an imbalance in the normal population of bacteria that live in the small intestine.  This imbalance can persist for 3 months and can cause a very serious infectious diarrhea.   Taking a probiotic ( Align, Floraque or Culturelle), or a generic form  for a minimum of 3 weeks may help prevent this serious antibiotic associated diarrhea  Called clostridium dificile colitis that occurs when the bacteria population is altered .  Taking a probiotic may also prevent vaginitis due to yeast infections and can be continued indefinitely if you feel that it improves your digestion or your elimination (bowels).     I am adding trazodone at night to help you get a better nights sleep   We are rechecking your urine to make sure your UTI has resolved

## 2022-09-16 NOTE — Progress Notes (Unsigned)
Subjective:  Patient ID: [name], female    DOB: 10-10-1926  Age: 89 y.o. MRN: [id number]  CC: {There were no encounter diagnoses. (Refresh or delete this SmartLink)}   HPI [name] presents for  Chief Complaint  Patient presents with   Medical Management of Chronic Issues   1) HTN:  HOME CHECKS HAVE BEEN MUCH LOWER.  Last reading monday night was 112/80. On a machien that has been hecked for accuracy,   She has a history of poor balance     2) recent fall with scalp laceration in mid August during a fall in her carport (sliced scalp on cement) .was taken to ER by neighbor    1 staple placed after head CT and c spine.  [name]  She was  treated for UTI (based on abnormal UA ) presented with back pain  with keflex.  Did not take a probiotic  3) history of CVA :  impacted her life negatively .  no longer driving   4)  chronic back pain .    5) Insomnia: using diazepan 5 mg at bedtime.  Some nights wakes up at 1:00 or 4:00 and lies awake th rest of the night.  Declines SSRI.  No prior trial of trazodone    Outpatient Medications Prior to Visit  Medication Sig Dispense Refill   amLODipine (NORVASC) 5 MG tablet Take 1 tablet (5 mg total) by mouth daily. 90 tablet 1   Calcium Carb-Cholecalciferol (CALCIUM 600/VITAMIN D3 PO) Take 1 tablet by mouth daily.     diazepam (VALIUM) 5 MG tablet TAKE 1 TABLET DAILY AS NEEDED FOR ANXIETY OR SPASM 30 tablet 0   FIBER PO Take 1 capsule by mouth in the morning, at noon, and at bedtime.     FIBERCON 625 MG tablet Take 625 mg by mouth daily.     Lutein 20 MG CAPS Take 20 mg by mouth daily.     propranolol ER (INDERAL LA) 60 MG 24 hr capsule TAKE 1 CAPSULE BY MOUTH EVERY DAY 90 capsule 1   Propylene Glycol (SYSTANE BALANCE OP) Place 1 drop into both eyes 4 (four) times daily as needed (dry eyes).     sennosides-docusate sodium (SENOKOT-S) 8.6-50 MG tablet Take 1 tablet by mouth daily as needed for constipation.     Vitamin E 400 units TABS  Take 1 tablet by mouth daily.     No facility-administered medications prior to visit.    Review of Systems;  Patient denies headache, fevers, malaise, unintentional weight loss, skin rash, eye pain, sinus congestion and sinus pain, sore throat, dysphagia,  hemoptysis , cough, dyspnea, wheezing, chest pain, palpitations, orthopnea, edema, abdominal pain, nausea, melena, diarrhea, constipation, flank pain, dysuria, hematuria, urinary  Frequency, nocturia, numbness, tingling, seizures,  Focal weakness, Loss of consciousness,  Tremor, insomnia, depression, anxiety, and suicidal ideation.      Objective:  BP (!) 170/74   Pulse 60   Ht 5\' 6"  (1.676 m)   Wt 122 lb 12.8 oz (55.7 kg)   SpO2 95%   BMI 19.82 kg/m   BP Readings from Last 3 Encounters:  09/16/22 (!) 170/74  08/26/22 116/66  03/09/22 (!) 144/90    Wt Readings from Last 3 Encounters:  09/16/22 122 lb 12.8 oz (55.7 kg)  08/26/22 122 lb 6.4 oz (55.5 kg)  06/30/22 122 lb (55.3 kg)    Physical Exam  No results found for: "HGBA1C"  Lab Results  Component Value Date  CREATININE 0.82 03/09/2022   CREATININE 1.00 06/30/2021   CREATININE 0.72 06/29/2021    Lab Results  Component Value Date   WBC 6.8 06/30/2021   HGB 10.7 (L) 06/30/2021   HCT 32.7 (L) 06/30/2021   PLT 228 06/30/2021   GLUCOSE 83 03/09/2022   CHOL 254 (H) 06/09/2016   TRIG 103.0 06/09/2016   HDL 60.00 06/09/2016   LDLDIRECT 145.6 12/21/2012   LDLCALC 174 (H) 06/09/2016   ALT 7 03/09/2022   AST 16 03/09/2022   NA 139 03/09/2022   K 4.4 03/09/2022   CL 101 03/09/2022   CREATININE 0.82 03/09/2022   BUN 20 03/09/2022   CO2 29 03/09/2022   TSH 1.38 09/14/2019   INR 0.99 11/30/2016   MICROALBUR 5.8 (H) 03/11/2022    Ultrasound renal complete  Result Date: 07/11/2021 CLINICAL DATA:  Incomplete emptying of the urinary bladder. EXAM: RENAL / URINARY TRACT ULTRASOUND COMPLETE COMPARISON:  Pelvic ultrasound 05/01/2021 FINDINGS: Right Kidney: Renal  measurements: 9.4 x 5.0 x 5.1 cm = volume: 124 mL. Echogenicity within normal limits. No mass or hydronephrosis visualized. Left Kidney: Renal measurements: 9.7 x 4.1 x 4.0 cm = volume: 83 mL. Normal renal cortical thickness and echogenicity. Mild pelviectasis. No renal mass. Bladder: Appears normal for degree of bladder distention. Other: Prevoid: 412 cc. Patient was not able to void while present for the examination. IMPRESSION: Mild left pelviectasis. Electronically Signed   By: Annia Belt M.D.   On: 07/11/2021 12:31    Assessment & Plan:  .There are no diagnoses linked to this encounter.   I provided 30 minutes of face-to-face time during this encounter reviewing patient's last visit with me, patient's  most recent visit with cardiology,  nephrology,  and neurology,  recent surgical and non surgical procedures, previous  labs and imaging studies, counseling on currently addressed issues,  and post visit ordering to diagnostics and therapeutics .   Follow-up: No follow-ups on file.   Sherlene Shams, MD

## 2022-09-17 LAB — URINALYSIS, ROUTINE W REFLEX MICROSCOPIC
Bilirubin Urine: NEGATIVE
Hgb urine dipstick: NEGATIVE
Ketones, ur: NEGATIVE
Nitrite: POSITIVE — AB
RBC / HPF: NONE SEEN (ref 0–?)
Specific Gravity, Urine: 1.015 (ref 1.000–1.030)
Total Protein, Urine: NEGATIVE
Urine Glucose: NEGATIVE
Urobilinogen, UA: 0.2 (ref 0.0–1.0)
pH: 7 (ref 5.0–8.0)

## 2022-09-17 NOTE — Assessment & Plan Note (Addendum)
She was treated empirically with resolution of symptoms and requests repeat antibiotics although symptoms have resolved.  I hvave ordered a UA ND CULTURE and her UA is highly suggestive of infection

## 2022-09-17 NOTE — Assessment & Plan Note (Signed)
Now healed

## 2022-09-17 NOTE — Assessment & Plan Note (Signed)
Managed with diazepam. Adding trazodone for sleep maintenance

## 2022-09-18 LAB — URINE CULTURE
MICRO NUMBER:: 15452128
SPECIMEN QUALITY:: ADEQUATE

## 2022-09-18 MED ORDER — CIPROFLOXACIN HCL 250 MG PO TABS: 250.0000 mg | ORAL_TABLET | Freq: Two times a day (BID) | ORAL | 0 refills | Status: DC

## 2022-09-18 NOTE — Addendum Note (Signed)
Addended by: Sherlene Shams on: 09/18/2022 12:32 PM   Modules accepted: Orders

## 2022-09-21 ENCOUNTER — Ambulatory Visit
Admission: EM | Admit: 2022-09-21 | Discharge: 2022-09-21 | Disposition: A | Discharge status: Home / Self Care | Payer: Medicare Other | Attending: Family Medicine | Admitting: Family Medicine

## 2022-09-21 ENCOUNTER — Encounter: Payer: Self-pay | Admitting: Emergency Medicine

## 2022-09-21 ENCOUNTER — Ambulatory Visit (INDEPENDENT_AMBULATORY_CARE_PROVIDER_SITE_OTHER): Payer: Medicare Other

## 2022-09-21 DIAGNOSIS — K5900 Constipation, unspecified: Secondary | ICD-10-CM | POA: Diagnosis not present

## 2022-09-21 NOTE — Discharge Instructions (Signed)
See handout on constipation.   You are constipated and need help to clean out the large amount of stool (poop) in the intestine. This guide tells you what medicine to use.  What do I need to know before starting the clean out?  It will take about 4 to 6 hours to take the medicine.  After taking the medicine, you should have a large stool within 24 hours.  Plan to stay close to a bathroom until the stool has passed. After the intestine is cleaned out, you will need to take a daily medicine.   Remember:  Constipation can last a long time. It may take 6 to 12 months for you to get back to regular bowel movements (BMs). Be patient. Things will get better slowly over time.    When should you start the clean out?  Start the home clean out on a Friday afternoon or some other time when you will be home (and not at school).  Start between 2:00 and 4:00 in the afternoon.  You should have almost clear liquid stools by the end of the next day. If the medicine does not work or you don't know if it worked, Physicist, medical or nurse.  What medicine do I need to take?  You need to take Miralax, a powder that you mix in a clear liquid.  Follow these steps: ?    Stir the Miralax powder into water, juice, or Gatorade. Your Miralax dose is: 8 capfuls of Miralax powder in 32  ounces of liquid. Take 2 tablets of senna twice a day.  ?    Drink 4 to 8 ounces every 30 minutes. It will take 4 to 6 hours to finish the medicine. ?    After the medicine is gone, drink more water or juice. This will help with the cleanout.   -     If the medicine gives you an upset stomach, slow down or stop.   Do I need to keep taking medicine?                                                                                                      After the clean out, you will take a daily (maintenance) medicine for at least 6 months. Your Miralax dose is:    1  capful of powder in 8 ounces of liquid every day   What if I  get constipated again?  Some people need to have the clean out more than one time for the problem to go away. Contact your doctor to ask if you should repeat the clean out. It is OK to do it again, but you should wait at least a week before repeating the clean out.    Will I have any problems with the medicine?   You may have stomach pain or cramping during the clean out. This might mean you have to go to the bathroom.   Take some time to sit on the toilet. The pain will go away when the stool is gone. You may want to read while  you wait. A warm bath may also help.   What should I eat and drink?  Drink lots of water and juice. Fruits and vegetables are good foods to eat. Try to avoid greasy and fatty foods. Goal is to get 25-35 grams of fiber daily.

## 2022-09-21 NOTE — ED Provider Notes (Signed)
[facility]    CSN: [id number] Arrival date & time: 09/21/22  1115      History   Chief Complaint Chief Complaint  Patient presents with   Constipation    HPI ZARAHI FOOR is a 89 y.o. female.   HPI  History provided by patient and her adopted daughter  Shimere presents for constipation since last Wednesday.  She went out shopping Wednesday and at supper but when she got home she drank some strawberry Nesquick with milk.  States the milk tasted alright but had some diarrhea shortly after.  This morning, had 1-2 "round hard balls" come out.  She is not passing gas from below. Normally she poops every other dad with Senakot twice a day.  She took 3 Senakot last night.  She drinks less than a glass of water a day.  States she normally urinates twice a day but sometimes 3 times a day.  Reports never having a colonoscopy as she has not needed one.    Past Medical History:  Diagnosis Date   Anxiety    Arthritis    "may have a touch; all over I guess" (10/11/2017)   Back pain    "all my back" (10/11/2017)   Basal cell carcinoma    "have had many taken off; burned" (10/11/2017)   Chronic orthostatic hypotension 06/2010   with pprior syncopal event,  did not tolelrate florinef trial   Complication of anesthesia    2019 ws very confused for several days after anesthesia   Constipation 08/12/2013   Depression    Hyperlipidemia    Hypertension    Tibia/fibula fracture 10/09/2017   Urinary frequency     Patient Active Problem List   Diagnosis Date Noted   Scalp laceration 08/26/2022   UTI (urinary tract infection) 08/26/2022   COVID-19 virus infection 10/31/2021   Myalgia due to statin 08/13/2021   Urinary retention with incomplete bladder emptying 07/01/2021   History of CVA (cerebrovascular accident) 06/05/2021   Carotid stenosis, left 06/05/2021   Edema of both lower extremities 09/16/2019   Hair changes 01/10/2019   Abdominal swelling, left lower quadrant  11/15/2018   Cervical radiculopathy due to degenerative joint disease of spine 04/07/2018   CVD (cerebrovascular disease) 04/07/2018   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   Osteoporosis 03/13/2015   Raynaud phenomenon 02/24/2015   Hyponatremia 02/09/2015   Medicare annual wellness visit, subsequent 01/06/2015   Encounter for preventive health examination 01/06/2015   Underweight 06/15/2014   Chronic left hip pain 06/13/2014   Benign essential tremor 10/02/2013   Constipation 08/12/2013   Urinary incontinence, urge 05/29/2012   Screening for breast cancer 10/27/2011   Degeneration of lumbar or lumbosacral intervertebral disc 09/01/2011   Insomnia 01/07/2011   Hyperlipidemia LDL goal <130 11/08/2010   Back pain, sacroiliac 11/08/2010   Anxiety    Essential hypertension 09/18/2010    Past Surgical History:  Procedure Laterality Date   ANKLE FRACTURE SURGERY Left 1998   CATARACT EXTRACTION W/ INTRAOCULAR LENS  IMPLANT, BILATERAL Bilateral    DILATION AND CURETTAGE OF UTERUS     FRACTURE SURGERY     IM NAILING TIBIA Left 10/10/2017   LAPAROSCOPIC BILATERAL SALPINGO OOPHERECTOMY Bilateral 09/02/2020   Procedure: LAPAROSCOPIC BILATERAL SALPINGO OOPHORECTOMY;  Surgeon: Schermerhorn, Ihor Austin, MD;  Location: [facility] OR[facility];  Service: Gynecology;  Laterality: Bilateral;   TIBIA IM NAIL INSERTION Left 10/10/2017   Procedure: INTRAMEDULLARY (IM) NAIL TIBIAL;  Surgeon: Tarry Kos, MD;  Location: [facility] OR;  Service: Orthopedics;  Laterality: Left;   WRIST FRACTURE SURGERY Right 2005    OB History   No obstetric history on file.      Home Medications    Prior to Admission medications   Medication Sig Start Date End Date Taking? Authorizing Provider  amLODipine (NORVASC) 5 MG tablet Take 1 tablet (5 mg total) by mouth daily. 09/08/22   Sherlene Shams, MD  Calcium Carb-Cholecalciferol (CALCIUM 600/VITAMIN D3 PO) Take 1 tablet by mouth daily.    [provider]   ciprofloxacin (CIPRO) 250 MG tablet Take 1 tablet (250 mg total) by mouth 2 (two) times daily for 5 days. 09/18/22 09/23/22  Sherlene Shams, MD  diazepam (VALIUM) 5 MG tablet TAKE 1 TABLET DAILY AS NEEDED FOR ANXIETY OR SPASM 09/16/22   Sherlene Shams, MD  FIBER PO Take 1 capsule by mouth in the morning, at noon, and at bedtime.    [provider]  FIBERCON 625 MG tablet Take 625 mg by mouth daily. 06/12/21   [provider]  Lutein 20 MG CAPS Take 20 mg by mouth daily.    [provider]  propranolol ER (INDERAL LA) 60 MG 24 hr capsule TAKE 1 CAPSULE BY MOUTH EVERY DAY 09/08/22   Sherlene Shams, MD  Propylene Glycol (SYSTANE BALANCE OP) Place 1 drop into both eyes 4 (four) times daily as needed (dry eyes).    [provider]  sennosides-docusate sodium (SENOKOT-S) 8.6-50 MG tablet Take 1 tablet by mouth daily as needed for constipation. 10/14/17   [provider]  traZODone (DESYREL) 50 MG tablet Take 1 tablet (50 mg total) by mouth at bedtime as needed for sleep. 09/16/22   Sherlene Shams, MD  Vitamin E 400 units TABS Take 1 tablet by mouth daily. 06/12/21   [provider]    Family History Family History  Problem Relation Age of Onset   Colon cancer Mother    Heart disease Father    Kidney disease Father    Cancer Neg Hx    Diabetes Neg Hx     Social History Social History   Tobacco Use   Smoking status: Never   Smokeless tobacco: Never  Vaping Use   Vaping status: Never Used  Substance Use Topics   Alcohol use: Never   Drug use: Never     Allergies   Atorvastatin and Flomax [tamsulosin]   Review of Systems Review of Systems :negative unless otherwise stated in HPI.      Physical Exam Triage Vital Signs ED Triage Vitals  Encounter Vitals Group     BP 09/21/22 1148 (!) 142/68     Systolic BP Percentile --      Diastolic BP Percentile --      Pulse --      Resp 09/21/22 1148 18     Temp 09/21/22 1148 (!) 97.5 F  (36.4 C)     Temp Source 09/21/22 1148 Oral     SpO2 --      Weight --      Height --      Head Circumference --      Peak Flow --      Pain Score 09/21/22 1146 0     Pain Loc --      Pain Education --      Exclude from Growth Chart --    No data found.  Updated Vital Signs BP (!) 142/68 (BP Location: Left  Arm)   Temp (!) 97.5 F (36.4 C) (Oral)   Resp 18   Visual Acuity Right Eye Distance:   Left Eye Distance:   Bilateral Distance:    Right Eye Near:   Left Eye Near:    Bilateral Near:     Physical Exam  GEN: pleasant well appearing female, in no acute distress *** CV: regular rate and rhythm, no murmurs appreciated *** RESP: no increased work of breathing, clear to ascultation bilaterally ABD: Bowel sounds present. Soft,***non-tender,***non-distended. ***No guarding,***no rebound,***no appreciable hepatosplenomegaly,***no CVA tenderness,***negative McBurney's,***negative Murphy MSK: no extremity edema SKIN: warm, dry, no rash on visible skin NEURO: alert, moves all extremities appropriately PSYCH: Normal affect, appropriate speech and behavior   UC Treatments / Results  Labs (all labs ordered are listed, but only abnormal results are displayed) Labs Reviewed - No data to display  EKG  If EKG performed, see my interpretation and MDM section  Radiology No results found.   Procedures Procedures (including critical care time)  Medications Ordered in UC Medications - No data to display  Initial Impression / Assessment and Plan / UC Course  I have reviewed the triage vital signs and the nursing notes.  Pertinent labs & imaging results that were available during my care of the patient were reviewed by me and considered in my medical decision making (see chart for details).     ***  Patient is a  89 y.o. femalewith history *** who presents after having insidious severe abdominal pain about *** ago.  Overall, patient is well-appearing, well-hydrated, and  in no acute distress.  Vital signs stable.  Geliais afebrile.  Exam is ***not concerning for an acute abdomen.  Obtained UA, urine pregnancy***, CBC, CMP, and lipase.  No personal history of kidney stones.      DDX:  -UTI: Urine unremarkable -STI: Not sexually active -Ovarian torsion versus cyst: Pain is epigastric less likely -Biliary colic/gallstone: Labs today, consider right upper quadrant ultrasound -Pancreatitis: Lipase today -Gastroenteritis: Not likely given no vomiting or diarrhea -Kidney stone: No hematuria on UA, no CVA tenderness -Constipation: None reported -Appendicitis: status post recent appendectomy   Constipation: ***Provided and reviewed constipation clean out and maintenance plan with patient and ***. Discussed eating small meals frequently and increased fluid intake, as well as gradual increase in fiber intake with dried fruits, vegetables with skins, beans, whole grains and cereal. Goal ***25-35g of fiber per day. Encouraged drinking hot beverages and prune juice; add probiotic-containing foods like pasteurized yogurt and kefir; encourage increased physical activity.     Follow-up, return and ED precautions given.  Discussed MDM, treatment plan and plan for follow-up with patient/parent who agrees with plan.    Final Clinical Impressions(s) / UC Diagnoses   Final diagnoses:  None   Discharge Instructions   None    ED Prescriptions   None    PDMP not reviewed this encounter.

## 2022-09-21 NOTE — ED Triage Notes (Signed)
Pt reports being constipated x 1 week. She has tried senokot every night and took 3 tablets last night.

## 2022-09-23 ENCOUNTER — Other Ambulatory Visit: Payer: Medicare Other

## 2022-10-02 ENCOUNTER — Telehealth: Payer: Self-pay

## 2022-10-02 NOTE — Telephone Encounter (Signed)
Pt LM on triage line stating that she would like to make an appt for possible UTI.  Called pt back to schedule. No answer. Unable to leave message as voicemail is not set up.

## 2022-10-05 ENCOUNTER — Other Ambulatory Visit: Payer: Self-pay | Admitting: Internal Medicine

## 2022-10-05 ENCOUNTER — Telehealth: Payer: Self-pay

## 2022-10-05 DIAGNOSIS — R300 Dysuria: Secondary | ICD-10-CM

## 2022-10-05 DIAGNOSIS — R350 Frequency of micturition: Secondary | ICD-10-CM

## 2022-10-05 MED ORDER — CIPROFLOXACIN HCL 250 MG PO TABS: 250.0000 mg | ORAL_TABLET | Freq: Two times a day (BID) | ORAL | 0 refills | Status: AC

## 2022-10-05 NOTE — Telephone Encounter (Signed)
Patient states she is having the following symptoms:  frequency, back is hurting.  Patient states she believes she has a UTI.  Patient states she saw Dr. Duncan Dull on 09/16/2022 and she did treat her for a UTI.  Patient states she would like for Dr. Darrick Huntsman to please send in more medication for her UTI.  Patient states she has to pay someone to bring her to our office, so she would like to see if Dr. Darrick Huntsman will just call in the medication for her.  Patient states her preferred pharmacy is [facility] in [place].

## 2022-10-05 NOTE — Telephone Encounter (Signed)
Pt is aware and gave a verbal understanding.  

## 2022-10-05 NOTE — Assessment & Plan Note (Signed)
WITH BACK PAIN. TREATED FOR KLEBSIELLA UTI SEPT 13-SEPT 18 WITH CIPRO; SYMPTOMS RECURRED ON SEPT 29 . PATIENT REQUESTING TREATMENT WITHOUT CULTUREDUE TO TRANSPORTATION ISSUES.  WILL REPEAT EMPIRIC TREATMENT ONE TIME ONLY

## 2022-10-08 DIAGNOSIS — H353221 Exudative age-related macular degeneration, left eye, with active choroidal neovascularization: Secondary | ICD-10-CM | POA: Diagnosis not present

## 2022-10-12 NOTE — Telephone Encounter (Signed)
Does pt need to schedule another appt with you or does she just need to collect her urine again?

## 2022-10-12 NOTE — Telephone Encounter (Signed)
Pt wants to be called because she wants to know if she still has a UTI

## 2022-10-15 NOTE — Telephone Encounter (Signed)
Attempted to call pt. No answer no voicemail.  

## 2022-10-16 ENCOUNTER — Other Ambulatory Visit: Payer: Medicare Other

## 2022-10-16 ENCOUNTER — Telehealth: Payer: Self-pay

## 2022-10-16 DIAGNOSIS — R300 Dysuria: Secondary | ICD-10-CM | POA: Diagnosis not present

## 2022-10-16 NOTE — Telephone Encounter (Signed)
Patient states she would like to know if we have received her specimen.

## 2022-10-16 NOTE — Telephone Encounter (Signed)
Pt called this morning and she stated that she isn't sure if she still has a UTI or not but would like to have her urine rechecked anyways. Pt stated that she isn't sure if she will be able to drop off the urine today or Monday. I have placed pt on the schedule and orders have been placed.

## 2022-10-16 NOTE — Telephone Encounter (Signed)
Patient states a lady is bringing her urine specimen in today.  Patient states she is picking up the specimen from patient's house at 2pm.

## 2022-10-16 NOTE — Addendum Note (Signed)
Addended by: Warden Fillers on: 10/16/2022 10:16 AM   Modules accepted: Orders

## 2022-10-16 NOTE — Telephone Encounter (Signed)
Lab orders are in and pt is scheduled for the lab appt today.

## 2022-10-16 NOTE — Telephone Encounter (Signed)
Spoke with pt to let her know that we did receive her urine and it will go out this evening for testing.

## 2022-10-17 LAB — URINALYSIS, ROUTINE W REFLEX MICROSCOPIC
Bilirubin, UA: NEGATIVE
Glucose, UA: NEGATIVE
Ketones, UA: NEGATIVE
Nitrite, UA: NEGATIVE
RBC, UA: NEGATIVE
Specific Gravity, UA: 1.013 (ref 1.005–1.030)
Urobilinogen, Ur: 0.2 mg/dL (ref 0.2–1.0)
pH, UA: 6.5 (ref 5.0–7.5)

## 2022-10-17 LAB — MICROSCOPIC EXAMINATION: Casts: NONE SEEN /[LPF]

## 2022-10-19 ENCOUNTER — Other Ambulatory Visit: Payer: Medicare Other

## 2022-10-20 ENCOUNTER — Telehealth: Payer: Self-pay | Admitting: Internal Medicine

## 2022-10-20 NOTE — Telephone Encounter (Signed)
Patient just called and would like for Shanda Bumps to call her concerning her Kidneys. Her number is [phone].

## 2022-10-20 NOTE — Telephone Encounter (Signed)
Attempted to call pt. No answer no voicemail.  

## 2022-10-21 NOTE — Telephone Encounter (Signed)
Pt called requesting her urine results. Pt stated that she is still urinating frequently.

## 2022-10-22 LAB — URINE CULTURE

## 2022-10-22 MED ORDER — CEFDINIR 300 MG PO CAPS: 300.0000 mg | ORAL_CAPSULE | Freq: Two times a day (BID) | ORAL | 0 refills | Status: AC

## 2022-10-22 NOTE — Assessment & Plan Note (Signed)
Agfter 6 days her 2nd urine culture is finall yresulted and suggests a 2nd infection , this time pseudomonas,  resistant to fluoroquinolones.  Omnicef rx'd/

## 2022-10-22 NOTE — Telephone Encounter (Signed)
Spoke with pt to let her know that we have not received the final results of the culture yet. I advised pt that if we have not received them by this afternoon that I would call [facility] to follow up on this.

## 2022-10-23 ENCOUNTER — Telehealth: Payer: Self-pay

## 2022-10-23 ENCOUNTER — Telehealth: Payer: Self-pay | Admitting: Internal Medicine

## 2022-10-23 NOTE — Telephone Encounter (Signed)
Patient wanted Shanda Bumps to call her back. She has questions about her cefdinir (OMNICEF) 300 MG capsule medication.

## 2022-10-23 NOTE — Telephone Encounter (Signed)
error 

## 2022-10-23 NOTE — Telephone Encounter (Signed)
Attempted to call pt. No answer no voicemail.  

## 2022-10-23 NOTE — Telephone Encounter (Signed)
See result note message 

## 2022-10-23 NOTE — Telephone Encounter (Signed)
Pt  called back wanting to know what this medication was called in for. I explained to pt that his medication was sent in for her UTI. I explained to pt that I called to let her know this already and she stated that she is 89 years old and yesterday just wasn't a good day so she wasn't blaming herself for not remembering.

## 2022-11-05 DIAGNOSIS — H353221 Exudative age-related macular degeneration, left eye, with active choroidal neovascularization: Secondary | ICD-10-CM | POA: Diagnosis not present

## 2022-11-05 DIAGNOSIS — H3411 Central retinal artery occlusion, right eye: Secondary | ICD-10-CM | POA: Diagnosis not present

## 2022-11-12 ENCOUNTER — Ambulatory Visit: Payer: Medicare Other | Admitting: Physician Assistant

## 2022-11-12 DIAGNOSIS — R339 Retention of urine, unspecified: Secondary | ICD-10-CM | POA: Diagnosis not present

## 2022-11-12 LAB — MICROSCOPIC EXAMINATION

## 2022-11-12 LAB — URINALYSIS, COMPLETE
Bilirubin, UA: NEGATIVE
Glucose, UA: NEGATIVE
Ketones, UA: NEGATIVE
Leukocytes,UA: NEGATIVE
Nitrite, UA: NEGATIVE
Protein,UA: NEGATIVE
RBC, UA: NEGATIVE
Specific Gravity, UA: 1.015 (ref 1.005–1.030)
Urobilinogen, Ur: 0.2 mg/dL (ref 0.2–1.0)
pH, UA: 5.5 (ref 5.0–7.5)

## 2022-11-12 NOTE — Progress Notes (Signed)
11/12/2022 1:16 PM   [name] [birth year] [id number]  CC: Chief Complaint  Patient presents with   Follow-up   HPI: [name] is a 89 y.o. female with PMH chronic constipation, CVA, chronic orthostatic hypotension, possible recurrent UTI, nocturia, incomplete bladder emptying, and bladder diverticulum who presents today for evaluation of urinary frequency and incontinence.   Today she reports increased back pain over baseline after suffering a fall in August.  She was diagnosed with a UTI at the time and was treated with a total of 10 days of antibiotics.  She states her back pain has not improved, so she thinks she has had a persistent UTI.  She also reports concern that she does not urinate as often as she should.  In-office UA and microscopy pan negative. PVR .  PMH: Past Medical History:  Diagnosis Date   Anxiety    Arthritis    "may have a touch; all over I guess" (10/11/2017)   Back pain    "all my back" (10/11/2017)   Basal cell carcinoma    "have had many taken off; burned" (10/11/2017)   Chronic orthostatic hypotension 06/2010   with pprior syncopal event,  did not tolelrate florinef trial   Complication of anesthesia    2019 ws very confused for several days after anesthesia   Constipation 08/12/2013   Depression    Hyperlipidemia    Hypertension    Tibia/fibula fracture 10/09/2017   Urinary frequency     Surgical History: Past Surgical History:  Procedure Laterality Date   ANKLE FRACTURE SURGERY Left 1998   CATARACT EXTRACTION W/ INTRAOCULAR LENS  IMPLANT, BILATERAL Bilateral    DILATION AND CURETTAGE OF UTERUS     FRACTURE SURGERY     IM NAILING TIBIA Left 10/10/2017   LAPAROSCOPIC BILATERAL SALPINGO OOPHERECTOMY Bilateral 09/02/2020   Procedure: LAPAROSCOPIC BILATERAL SALPINGO OOPHORECTOMY;  Surgeon: Schermerhorn, Ihor Austin, MD;  Location: [facility] ORS;  Service: Gynecology;  Laterality: Bilateral;   TIBIA IM NAIL INSERTION Left 10/10/2017    Procedure: INTRAMEDULLARY (IM) NAIL TIBIAL;  Surgeon: Tarry Kos, MD;  Location: [facility] OR;  Service: Orthopedics;  Laterality: Left;   WRIST FRACTURE SURGERY Right 2005    Home Medications:  Allergies as of 11/12/2022       Reactions   Atorvastatin    Other reaction(s): Other (See Comments) Myopathy, transaminitis   Flomax [tamsulosin] Other (See Comments)   Syncope        Medication List        Accurate as of November 12, 2022  1:16 PM. If you have any questions, ask your nurse or doctor.          amLODipine 5 MG tablet Commonly known as: NORVASC Take 1 tablet (5 mg total) by mouth daily.   CALCIUM 600/VITAMIN D3 PO Take 1 tablet by mouth daily.   diazepam 5 MG tablet Commonly known as: VALIUM TAKE 1 TABLET DAILY AS NEEDED FOR ANXIETY OR SPASM   FIBER PO Take 1 capsule by mouth in the morning, at noon, and at bedtime.   FiberCon 625 MG tablet Generic drug: polycarbophil Take 625 mg by mouth daily.   Lutein 20 MG Caps Take 20 mg by mouth daily.   propranolol ER 60 MG 24 hr capsule Commonly known as: INDERAL LA TAKE 1 CAPSULE BY MOUTH EVERY DAY   sennosides-docusate sodium 8.6-50 MG tablet Commonly known as: SENOKOT-S Take 1 tablet by mouth daily as needed for constipation.   SYSTANE BALANCE  OP Place 1 drop into both eyes 4 (four) times daily as needed (dry eyes).   traZODone 50 MG tablet Commonly known as: DESYREL Take 1 tablet (50 mg total) by mouth at bedtime as needed for sleep.   Vitamin E 400 units Tabs Take 1 tablet by mouth daily.        Allergies:  Allergies  Allergen Reactions   Atorvastatin     Other reaction(s): Other (See Comments) Myopathy, transaminitis   Flomax [Tamsulosin] Other (See Comments)    Syncope     Family History: Family History  Problem Relation Age of Onset   Colon cancer Mother    Heart disease Father    Kidney disease Father    Cancer Neg Hx    Diabetes Neg Hx     Social History:   reports that  she has never smoked. She has never used smokeless tobacco. She reports that she does not drink alcohol and does not use drugs.  Physical Exam: There were no vitals taken for this visit.  Constitutional:  Alert and oriented, no acute distress, nontoxic appearing HEENT: [place], AT Cardiovascular: No clubbing, cyanosis, or edema Respiratory: Normal respiratory effort, no increased work of breathing Skin: No rashes, bruises or suspicious lesions Neurologic: Grossly intact, no focal deficits, moving all 4 extremities Psychiatric: Normal mood and affect  Laboratory Data: Results for orders placed or performed in visit on 11/12/22  Microscopic Examination   Urine  Result Value Ref Range   WBC, UA 0-5 0 - 5 /hpf   RBC, Urine 0-2 0 - 2 /hpf   Epithelial Cells (non renal) 0-10 0 - 10 /hpf   Mucus, UA Present (A) Not Estab.   Bacteria, UA Few None seen/Few  Urinalysis, Complete  Result Value Ref Range   Specific Gravity, UA 1.015 1.005 - 1.030   pH, UA 5.5 5.0 - 7.5   Color, UA Yellow Yellow   Appearance Ur Clear Clear   Leukocytes,UA Negative Negative   Protein,UA Negative Negative/Trace   Glucose, UA Negative Negative   Ketones, UA Negative Negative   RBC, UA Negative Negative   Bilirubin, UA Negative Negative   Urobilinogen, Ur 0.2 0.2 - 1.0 mg/dL   Nitrite, UA Negative Negative   Microscopic Examination See below:    Assessment & Plan:   1. Incomplete bladder emptying UA is bland today.  I reassured the patient multiple times that I have extremely low suspicion for UTI given bland UA.  She was very resistant to hearing this.  I encouraged her to follow-up with her PCP to discuss her worsening back pain after her fall.  With regard to her urinary infrequency, I explained that this is likely due to her chronic incomplete bladder emptying at baseline, however I do not have pharmacotherapy to address this.  She was frustrated today that I could not help her. - Urinalysis, Complete -  BLADDER SCAN AMB NON-IMAGING  Return if symptoms worsen or fail to improve.  Carman Ching, PA-C  [facility] [place] [address], [address] [place], [place] [address] [phone]

## 2022-11-20 NOTE — Telephone Encounter (Signed)
error 

## 2022-12-17 DIAGNOSIS — H3411 Central retinal artery occlusion, right eye: Secondary | ICD-10-CM | POA: Diagnosis not present

## 2022-12-17 DIAGNOSIS — H353221 Exudative age-related macular degeneration, left eye, with active choroidal neovascularization: Secondary | ICD-10-CM | POA: Diagnosis not present

## 2023-02-02 ENCOUNTER — Telehealth: Payer: Self-pay | Admitting: Internal Medicine

## 2023-02-02 NOTE — Telephone Encounter (Signed)
Placed in quick sign folder for signature.

## 2023-02-02 NOTE — Telephone Encounter (Signed)
Patient dropped off form to be filled out by provider. Please call patient when form is done and ready for pick up

## 2023-02-03 NOTE — Telephone Encounter (Signed)
Pt is aware that form is ready for pick up. Pt state that her niece will pick up.

## 2023-02-10 ENCOUNTER — Telehealth: Payer: Self-pay

## 2023-02-10 DIAGNOSIS — F5101 Primary insomnia: Secondary | ICD-10-CM

## 2023-02-10 MED ORDER — DIAZEPAM 5 MG PO TABS: ORAL_TABLET | ORAL | 0 refills | Status: DC

## 2023-02-10 NOTE — Assessment & Plan Note (Signed)
Managed with diazepam.  EARLY REFILL REQUEST FEB 5 GRANTED AS A ONE TIME COURTESY. PATIENT LOST THE BOTTLE SHE RECEIVED ON JAN 25

## 2023-02-10 NOTE — Telephone Encounter (Signed)
 Copied from CRM [id number]. Topic: Clinical - Prescription Issue >> Feb 10, 2023 10:00 AM Joanell B wrote: Reason for CRM: Sari called from [facility] and stated that the pt has miss placed her diazepam  (VALIUM ) 5 MG tablet and will be needing a prior authorization to replace the medication. Callback number [phone]

## 2023-02-10 NOTE — Addendum Note (Signed)
 Addended by: Thersia Flax on: 02/10/2023 12:50 PM   Modules accepted: Orders

## 2023-02-10 NOTE — Telephone Encounter (Signed)
 Pharmacy is needing a verbal okay to refill medication early due to pt misplacing the Diazepam .

## 2023-02-11 DIAGNOSIS — H353221 Exudative age-related macular degeneration, left eye, with active choroidal neovascularization: Secondary | ICD-10-CM | POA: Diagnosis not present

## 2023-02-11 DIAGNOSIS — H3411 Central retinal artery occlusion, right eye: Secondary | ICD-10-CM | POA: Diagnosis not present

## 2023-02-11 NOTE — Telephone Encounter (Signed)
 Spoke with the pharmacist Paige and she stated that they received the rx yesterday and pt has already picked up the rx.

## 2023-03-05 ENCOUNTER — Other Ambulatory Visit: Payer: Self-pay | Admitting: Internal Medicine

## 2023-03-09 ENCOUNTER — Other Ambulatory Visit: Payer: Self-pay | Admitting: Internal Medicine

## 2023-03-16 ENCOUNTER — Encounter: Payer: Self-pay | Admitting: Internal Medicine

## 2023-03-16 ENCOUNTER — Ambulatory Visit (INDEPENDENT_AMBULATORY_CARE_PROVIDER_SITE_OTHER): Payer: Medicare Other | Admitting: Internal Medicine

## 2023-03-16 VITALS — BP 130/60 | HR 64 | Ht 66.0 in | Wt 126.4 lb

## 2023-03-16 DIAGNOSIS — R5381 Other malaise: Secondary | ICD-10-CM | POA: Diagnosis not present

## 2023-03-16 DIAGNOSIS — Z79899 Other long term (current) drug therapy: Secondary | ICD-10-CM | POA: Diagnosis not present

## 2023-03-16 DIAGNOSIS — R636 Underweight: Secondary | ICD-10-CM | POA: Diagnosis not present

## 2023-03-16 DIAGNOSIS — R5383 Other fatigue: Secondary | ICD-10-CM | POA: Diagnosis not present

## 2023-03-16 DIAGNOSIS — E785 Hyperlipidemia, unspecified: Secondary | ICD-10-CM | POA: Diagnosis not present

## 2023-03-16 DIAGNOSIS — R944 Abnormal results of kidney function studies: Secondary | ICD-10-CM | POA: Diagnosis not present

## 2023-03-16 DIAGNOSIS — I10 Essential (primary) hypertension: Secondary | ICD-10-CM | POA: Diagnosis not present

## 2023-03-16 DIAGNOSIS — F5101 Primary insomnia: Secondary | ICD-10-CM | POA: Diagnosis not present

## 2023-03-16 DIAGNOSIS — N3941 Urge incontinence: Secondary | ICD-10-CM

## 2023-03-16 MED ORDER — PROPRANOLOL HCL ER 60 MG PO CP24: ORAL_CAPSULE | ORAL | 0 refills | Status: DC

## 2023-03-16 MED ORDER — AMLODIPINE BESYLATE 5 MG PO TABS: 5.0000 mg | ORAL_TABLET | Freq: Every day | ORAL | 0 refills | Status: DC

## 2023-03-16 MED ORDER — DIAZEPAM 5 MG PO TABS: ORAL_TABLET | ORAL | 5 refills | Status: DC

## 2023-03-16 NOTE — Assessment & Plan Note (Signed)
 She prefers to remain off of statins given her marked improvement in energy,  Coordination and general well being since stopping the atorvastatin.  Reviewed recent labs from neurology:  Ck and hepatic panel were normal. Given her age  no fasting lipids ordered

## 2023-03-16 NOTE — Assessment & Plan Note (Signed)
 improved with increased dose of amlodipine to 10 mg daily

## 2023-03-16 NOTE — Assessment & Plan Note (Addendum)
 She did not tolerate trazodone , prescribed to replace diazepam.  She has resumed diazepam.  The risks and benefits of benzodiazepine use were discussed with patient today including excessive sedation leading to respiratory depression,  impaired thinking/driving, and addiction.  Patient was advised to avoid concurrent use with alcohol, to use medication only as needed and not to share with others  .

## 2023-03-16 NOTE — Assessment & Plan Note (Signed)
I have reviewed her diet and recommended that she increase her protein and fat intake while monitoring her carbohydrates.

## 2023-03-16 NOTE — Progress Notes (Signed)
 Subjective:  Patient ID: [name], female    DOB: 1926-05-05  Age: 89 y.o. MRN: [id number]  CC: The primary encounter diagnosis was Essential hypertension. Diagnoses of Hyperlipidemia LDL goal <130, Long-term use of high-risk medication, Malaise and fatigue, Urinary incontinence, urge, Underweight, and Primary insomnia were also pertinent to this visit.   HPI [name] presents for  Chief Complaint  Patient presents with   Medical Management of Chronic Issues    6 month follow up    Accompanied by Renee her "adopted  dtr"  ( in-law post divorce from nephew ) -   1) HTN:  Patient is taking her medications as prescribed and notes no adverse effects.  Home BP readings have been done about once per week and are  generally < 130/80 .  She is avoiding added salt in her diet and walking regularly about 3 times per week for exercise  . Taking amlodipine and inderal   2) Reviewed findings of prior CT scan today..  Patient has a documented statin myopathy   3) history of fall in oct 2024 resulting in scalp laceration.  Occurred  while walking in the carport, when she turned around and lost balance   4) recurrent UTI .  No nocturia. No symptoms.  Last checked in October  no infection   5) chronic insomnia: did not tolerate trial of trazodone 50 mg  did not work.  Still tylenol PM and diazepam 5 mg  and averages at least 5 hours  in bed by 6:30-7:00  6) "I felt terrible all day yesterday"  but denies dysuria ,  myalgias,  fevers,  headache and nausea.  Feeling better today . "I'm ready to go to the [facility] anytime, I've lived a good long life.  All my family and friends are gone." Has wonderful neighbors   Outpatient Medications Prior to Visit  Medication Sig Dispense Refill   Calcium Carb-Cholecalciferol (CALCIUM 600/VITAMIN D3 PO) Take 1 tablet by mouth daily.     FIBER PO Take 1 capsule by mouth in the morning, at noon, and at bedtime.     FIBERCON 625 MG tablet Take 625 mg by  mouth daily.     Lutein 20 MG CAPS Take 20 mg by mouth daily.     Propylene Glycol (SYSTANE BALANCE OP) Place 1 drop into both eyes 4 (four) times daily as needed (dry eyes).     sennosides-docusate sodium (SENOKOT-S) 8.6-50 MG tablet Take 1 tablet by mouth daily as needed for constipation.     Vitamin E 400 units TABS Take 1 tablet by mouth daily.     amLODipine (NORVASC) 5 MG tablet Take 1 tablet (5 mg total) by mouth daily. 90 tablet 0   diazepam (VALIUM) 5 MG tablet TAKE 1 TABLET DAILY AS NEEDED FOR ANXIETY OR SPASM 30 tablet 0   propranolol ER (INDERAL LA) 60 MG 24 hr capsule TAKE 1 CAPSULE BY MOUTH EVERY DAY 90 capsule 0   traZODone (DESYREL) 50 MG tablet Take 1 tablet (50 mg total) by mouth at bedtime as needed for sleep. (Patient not taking: Reported on 03/16/2023) 30 tablet 5   No facility-administered medications prior to visit.    Review of Systems;  Patient denies headache, fevers, malaise, unintentional weight loss, skin rash, eye pain, sinus congestion and sinus pain, sore throat, dysphagia,  hemoptysis , cough, dyspnea, wheezing, chest pain, palpitations, orthopnea, edema, abdominal pain, nausea, melena, diarrhea, constipation, flank pain, dysuria, hematuria, urinary  Frequency, nocturia,  numbness, tingling, seizures,  Focal weakness, Loss of consciousness,  Tremor, insomnia, depression, anxiety, and suicidal ideation.      Objective:  BP 130/60   Pulse 64   Ht 5\' 6"  (1.676 m)   Wt 126 lb 6.4 oz (57.3 kg)   SpO2 98%   BMI 20.40 kg/m   BP Readings from Last 3 Encounters:  03/16/23 130/60  09/21/22 (!) 142/68  09/16/22 112/80    Wt Readings from Last 3 Encounters:  03/16/23 126 lb 6.4 oz (57.3 kg)  09/16/22 122 lb 12.8 oz (55.7 kg)  08/26/22 122 lb 6.4 oz (55.5 kg)    Physical Exam Vitals reviewed.  Constitutional:      General: She is not in acute distress.    Appearance: Normal appearance. She is normal weight. She is not ill-appearing, toxic-appearing or  diaphoretic.  HENT:     Head: Normocephalic.  Eyes:     General: No scleral icterus.       Right eye: No discharge.        Left eye: No discharge.     Conjunctiva/sclera: Conjunctivae normal.  Cardiovascular:     Rate and Rhythm: Normal rate and regular rhythm.     Heart sounds: Normal heart sounds.  Pulmonary:     Effort: Pulmonary effort is normal. No respiratory distress.     Breath sounds: Normal breath sounds.  Musculoskeletal:        General: Normal range of motion.  Skin:    General: Skin is warm and dry.  Neurological:     General: No focal deficit present.     Mental Status: She is alert and oriented to person, place, and time. Mental status is at baseline.  Psychiatric:        Mood and Affect: Mood normal.        Behavior: Behavior normal.        Thought Content: Thought content normal.        Judgment: Judgment normal.    No results found for: "HGBA1C"  Lab Results  Component Value Date   CREATININE 0.82 03/09/2022   CREATININE 1.00 06/30/2021   CREATININE 0.72 06/29/2021    Lab Results  Component Value Date   WBC 6.8 06/30/2021   HGB 10.7 (L) 06/30/2021   HCT 32.7 (L) 06/30/2021   PLT 228 06/30/2021   GLUCOSE 83 03/09/2022   CHOL 254 (H) 06/09/2016   TRIG 103.0 06/09/2016   HDL 60.00 06/09/2016   LDLDIRECT 145.6 12/21/2012   LDLCALC 174 (H) 06/09/2016   ALT 7 03/09/2022   AST 16 03/09/2022   NA 139 03/09/2022   K 4.4 03/09/2022   CL 101 03/09/2022   CREATININE 0.82 03/09/2022   BUN 20 03/09/2022   CO2 29 03/09/2022   TSH 1.38 09/14/2019   INR 0.99 11/30/2016   MICROALBUR 5.8 (H) 03/11/2022    DG Abd 1 View Result Date: 09/21/2022 CLINICAL DATA:  Constipation EXAM: ABDOMEN - 1 VIEW COMPARISON:  06/29/2021 FINDINGS: The bowel gas pattern is nonobstructive. Moderate volume stool within the colon and rectum. No radio-opaque calculi or other significant radiographic abnormality are seen. Lumbar levocurvature with degenerative spondylosis.  IMPRESSION: Negative. Electronically Signed   By: Duanne Guess D.O.   On: 09/21/2022 14:21    Assessment & Plan:  .Essential hypertension Assessment & Plan: improved with increased dose of amlodipine to 10 mg daily   Orders: -     Comprehensive metabolic panel  Hyperlipidemia LDL goal <130 Assessment & Plan: She  prefers to remain off of statins given her marked improvement in energy,  Coordination and general well being since stopping the atorvastatin.  Reviewed recent labs from neurology:  Ck and hepatic panel were normal. Given her age  no fasting lipids ordered     Long-term use of high-risk medication -     TSH  Malaise and fatigue -     CBC with Differential/Platelet -     TSH  Urinary incontinence, urge Assessment & Plan: Untreated since She has suspended Myrbetriq    Underweight Assessment & Plan:  I have reviewed her diet and recommended that she increase her protein and fat intake while monitoring her carbohydrates.     Primary insomnia Assessment & Plan: She did not tolerate trazodone , prescribed to replace diazepam.  She has resumed diazepam.  The risks and benefits of benzodiazepine use were discussed with patient today including excessive sedation leading to respiratory depression,  impaired thinking/driving, and addiction.  Patient was advised to avoid concurrent use with alcohol, to use medication only as needed and not to share with others  .     Other orders -     amLODIPine Besylate; Take 1 tablet (5 mg total) by mouth daily.  Dispense: 90 tablet; Refill: 0 -     Propranolol HCl ER; TAKE 1 CAPSULE BY MOUTH EVERY DAY  Dispense: 90 capsule; Refill: 0 -     diazePAM; TAKE 1 TABLET DAILY AS NEEDED FOR ANXIETY OR SPASM  Dispense: 30 tablet; Refill: 5     I spent 34 minutes on the day of this face to face encounter reviewing patient's  most recent visit with urology, urgent care and ER , prior relevant surgical and non surgical,  procedures, recent   labs and imaging studies, counseling on underweight management,  reviewing the assessment and plan with patient, and post visit ordering and reviewing of  diagnostics and therapeutics with patient  .   Follow-up: Return in about 6 months (around 09/16/2023).   Sherlene Shams, MD

## 2023-03-16 NOTE — Patient Instructions (Signed)
 I'm glad you are feeling better today!      Your blood pressure is under excellent control: continue amlodipine and propranolol    I have refilled your valium to use at night to help you sleep  Return in 6 months

## 2023-03-16 NOTE — Assessment & Plan Note (Addendum)
 Untreated since She has suspended Myrbetriq

## 2023-03-17 LAB — CBC WITH DIFFERENTIAL/PLATELET
Basophils Absolute: 0.1 10*3/uL (ref 0.0–0.1)
Basophils Relative: 0.7 % (ref 0.0–3.0)
Eosinophils Absolute: 0.2 10*3/uL (ref 0.0–0.7)
Eosinophils Relative: 2 % (ref 0.0–5.0)
HCT: 38.3 % (ref 36.0–46.0)
Hemoglobin: 12.7 g/dL (ref 12.0–15.0)
Lymphocytes Relative: 21.4 % (ref 12.0–46.0)
Lymphs Abs: 1.7 10*3/uL (ref 0.7–4.0)
MCHC: 33.3 g/dL (ref 30.0–36.0)
MCV: 100.2 fl — ABNORMAL HIGH (ref 78.0–100.0)
Monocytes Absolute: 0.7 10*3/uL (ref 0.1–1.0)
Monocytes Relative: 8.3 % (ref 3.0–12.0)
Neutro Abs: 5.5 10*3/uL (ref 1.4–7.7)
Neutrophils Relative %: 67.6 % (ref 43.0–77.0)
Platelets: 276 10*3/uL (ref 150.0–400.0)
RBC: 3.82 Mil/uL — ABNORMAL LOW (ref 3.87–5.11)
RDW: 13.7 % (ref 11.5–15.5)
WBC: 8.1 10*3/uL (ref 4.0–10.5)

## 2023-03-17 LAB — COMPREHENSIVE METABOLIC PANEL
ALT: 6 U/L (ref 0–35)
AST: 13 U/L (ref 0–37)
Albumin: 4.2 g/dL (ref 3.5–5.2)
Alkaline Phosphatase: 47 U/L (ref 39–117)
BUN: 18 mg/dL (ref 6–23)
CO2: 30 meq/L (ref 19–32)
Calcium: 9.6 mg/dL (ref 8.4–10.5)
Chloride: 103 meq/L (ref 96–112)
Creatinine, Ser: 1.03 mg/dL (ref 0.40–1.20)
GFR: 46.62 mL/min — ABNORMAL LOW (ref >60.00)
Glucose, Bld: 102 mg/dL — ABNORMAL HIGH (ref 70–99)
Potassium: 4.6 meq/L (ref 3.5–5.1)
Sodium: 140 meq/L (ref 135–145)
Total Bilirubin: 0.4 mg/dL (ref 0.2–1.2)
Total Protein: 7 g/dL (ref 6.0–8.3)

## 2023-03-17 LAB — TSH: TSH: 1.29 u[IU]/mL (ref 0.35–5.50)

## 2023-03-18 NOTE — Addendum Note (Signed)
 Addended by: Sherlene Shams on: 03/18/2023 10:02 PM   Modules accepted: Orders

## 2023-03-31 ENCOUNTER — Other Ambulatory Visit (INDEPENDENT_AMBULATORY_CARE_PROVIDER_SITE_OTHER)

## 2023-03-31 DIAGNOSIS — R944 Abnormal results of kidney function studies: Secondary | ICD-10-CM | POA: Diagnosis not present

## 2023-04-01 LAB — BASIC METABOLIC PANEL WITH GFR
BUN: 17 mg/dL (ref 6–23)
CO2: 30 meq/L (ref 19–32)
Calcium: 9.6 mg/dL (ref 8.4–10.5)
Chloride: 101 meq/L (ref 96–112)
Creatinine, Ser: 0.93 mg/dL (ref 0.40–1.20)
GFR: 52.69 mL/min — ABNORMAL LOW (ref >60.00)
Glucose, Bld: 81 mg/dL (ref 70–99)
Potassium: 4.5 meq/L (ref 3.5–5.1)
Sodium: 138 meq/L (ref 135–145)

## 2023-04-19 ENCOUNTER — Telehealth: Payer: Self-pay

## 2023-04-19 NOTE — Telephone Encounter (Signed)
 noted

## 2023-04-19 NOTE — Telephone Encounter (Signed)
 Copied from CRM [id number]. Topic: Clinical - Lab/Test Results >> Apr 19, 2023  8:18 AM Juluis Ok wrote: Reason for CRM: Patient's niece, Meriel Stank,  returning missed phone call. Relayed results per provider's note, verbatim and appointment reminder for 04/16. Patient representative verbalized understanding and no additional questions at this time.

## 2023-04-21 ENCOUNTER — Ambulatory Visit (INDEPENDENT_AMBULATORY_CARE_PROVIDER_SITE_OTHER): Admitting: Internal Medicine

## 2023-04-21 ENCOUNTER — Encounter: Payer: Self-pay | Admitting: Internal Medicine

## 2023-04-21 ENCOUNTER — Ambulatory Visit: Admitting: Internal Medicine

## 2023-04-21 VITALS — BP 158/82 | HR 67 | Ht 66.0 in | Wt 124.4 lb

## 2023-04-21 DIAGNOSIS — F5101 Primary insomnia: Secondary | ICD-10-CM | POA: Diagnosis not present

## 2023-04-21 DIAGNOSIS — I10 Essential (primary) hypertension: Secondary | ICD-10-CM

## 2023-04-21 DIAGNOSIS — R350 Frequency of micturition: Secondary | ICD-10-CM

## 2023-04-21 DIAGNOSIS — N3941 Urge incontinence: Secondary | ICD-10-CM | POA: Diagnosis not present

## 2023-04-21 MED ORDER — AMLODIPINE BESYLATE 10 MG PO TABS: 10.0000 mg | ORAL_TABLET | Freq: Every day | ORAL | 1 refills | Status: DC

## 2023-04-21 NOTE — Assessment & Plan Note (Signed)
 improved with increased dose of amlodipine to 10 mg daily , but she has only been taking 5 mg daily .  Advised to increase dose of amlodipine

## 2023-04-21 NOTE — Progress Notes (Unsigned)
 Subjective:  Patient ID: [name], female    DOB: 05-03-1926  Age: 89 y.o. MRN: [id number]  CC: There were no encounter diagnoses.   HPI [name] presents for  Chief Complaint  Patient presents with   Medical Management of Chronic Issues    Discuss blood pressure medications.    1) HTN:  Hypertension: patient checks blood pressure twice weekly at home.  Readings have been labile; low in the morning , elevated after activity  the most part >130/80 at rest . Patient is following a reduced salt diet most days and is taking medications as prescribed : amlodipine 5 mg daily inderal LA 60 mg daily   2) urinary frequency for the past 2 weeks .  Has been taking an OTC med "for UTI" that has helped the "feeling in my head" that she cannot describe. Not AZO.    3) CKD:  GFR stable.  Not using NSAIDS feels that her kidney function changed with her CVA last year   4)    Outpatient Medications Prior to Visit  Medication Sig Dispense Refill   amLODipine (NORVASC) 5 MG tablet Take 1 tablet (5 mg total) by mouth daily. 90 tablet 0   Calcium Carb-Cholecalciferol (CALCIUM 600/VITAMIN D3 PO) Take 1 tablet by mouth daily.     diazepam (VALIUM) 5 MG tablet TAKE 1 TABLET DAILY AS NEEDED FOR ANXIETY OR SPASM 30 tablet 5   FIBER PO Take 1 capsule by mouth in the morning, at noon, and at bedtime.     FIBERCON 625 MG tablet Take 625 mg by mouth daily.     Lutein 20 MG CAPS Take 20 mg by mouth daily.     propranolol ER (INDERAL LA) 60 MG 24 hr capsule TAKE 1 CAPSULE BY MOUTH EVERY DAY 90 capsule 0   Propylene Glycol (SYSTANE BALANCE OP) Place 1 drop into both eyes 4 (four) times daily as needed (dry eyes).     sennosides-docusate sodium (SENOKOT-S) 8.6-50 MG tablet Take 1 tablet by mouth daily as needed for constipation.     Vitamin E 400 units TABS Take 1 tablet by mouth daily.     No facility-administered medications prior to visit.    Review of Systems;  Patient denies headache,  fevers, malaise, unintentional weight loss, skin rash, eye pain, sinus congestion and sinus pain, sore throat, dysphagia,  hemoptysis , cough, dyspnea, wheezing, chest pain, palpitations, orthopnea, edema, abdominal pain, nausea, melena, diarrhea, constipation, flank pain, dysuria, hematuria, urinary  Frequency, nocturia, numbness, tingling, seizures,  Focal weakness, Loss of consciousness,  Tremor, insomnia, depression, anxiety, and suicidal ideation.      Objective:  There were no vitals taken for this visit.  BP Readings from Last 3 Encounters:  03/16/23 130/60  09/21/22 (!) 142/68  09/16/22 112/80    Wt Readings from Last 3 Encounters:  03/16/23 126 lb 6.4 oz (57.3 kg)  09/16/22 122 lb 12.8 oz (55.7 kg)  08/26/22 122 lb 6.4 oz (55.5 kg)    Physical Exam  No results found for: "HGBA1C"  Lab Results  Component Value Date   CREATININE 0.93 03/31/2023   CREATININE 1.03 03/16/2023   CREATININE 0.82 03/09/2022    Lab Results  Component Value Date   WBC 8.1 03/16/2023   HGB 12.7 03/16/2023   HCT 38.3 03/16/2023   PLT 276.0 03/16/2023   GLUCOSE 81 03/31/2023   CHOL 254 (H) 06/09/2016   TRIG 103.0 06/09/2016   HDL 60.00 06/09/2016   LDLDIRECT  145.6 12/21/2012   LDLCALC 174 (H) 06/09/2016   ALT 6 03/16/2023   AST 13 03/16/2023   NA 138 03/31/2023   K 4.5 03/31/2023   CL 101 03/31/2023   CREATININE 0.93 03/31/2023   BUN 17 03/31/2023   CO2 30 03/31/2023   TSH 1.29 03/16/2023   INR 0.99 11/30/2016   MICROALBUR 5.8 (H) 03/11/2022    DG Abd 1 View Result Date: 09/21/2022 CLINICAL DATA:  Constipation EXAM: ABDOMEN - 1 VIEW COMPARISON:  06/29/2021 FINDINGS: The bowel gas pattern is nonobstructive. Moderate volume stool within the colon and rectum. No radio-opaque calculi or other significant radiographic abnormality are seen. Lumbar levocurvature with degenerative spondylosis. IMPRESSION: Negative. Electronically Signed   By: Leverne Reading D.O.   On: 09/21/2022 14:21     Assessment & Plan:  .There are no diagnoses linked to this encounter.   I spent 34 minutes on the day of this face to face encounter reviewing patient's  most recent visit with cardiology,  nephrology,  and neurology,  prior relevant surgical and non surgical procedures, recent  labs and imaging studies, counseling on weight management,  reviewing the assessment and plan with patient, and post visit ordering and reviewing of  diagnostics and therapeutics with patient  .   Follow-up: No follow-ups on file.   Thersia Flax, MD

## 2023-04-21 NOTE — Patient Instructions (Signed)
 Please increase the amlodipine to 10 mg daily for your blood pressure  Eat and drink what you want. But staying hydrated is key!    You might want to try a premixed protein drink called Premier Protein shake for breakfast or late night snack . It is great tasting,   very low sugar and available of < $2 serving at [facility] and  In bulk for $1.50/serving at [employer] and [employer]  .    Nutritional analysis :  160 cal  30 g protein  1 g sugar 50% calcium needs

## 2023-04-22 LAB — URINALYSIS, ROUTINE W REFLEX MICROSCOPIC
Bilirubin Urine: NEGATIVE
Hgb urine dipstick: NEGATIVE
Ketones, ur: NEGATIVE
Nitrite: POSITIVE — AB
Specific Gravity, Urine: 1.01 (ref 1.000–1.030)
Total Protein, Urine: NEGATIVE
Urine Glucose: NEGATIVE
Urobilinogen, UA: 0.2 (ref 0.0–1.0)
pH: 6.5 (ref 5.0–8.0)

## 2023-04-22 LAB — URINE CULTURE
MICRO NUMBER:: 16337559
SPECIMEN QUALITY:: ADEQUATE

## 2023-04-22 NOTE — Assessment & Plan Note (Signed)
 With increased frequency x 2 weeks.  UA notes mild pyuria,  but There is no evidence of UTI by culture results.

## 2023-04-22 NOTE — Telephone Encounter (Signed)
 Copied from CRM [id number]. Topic: Clinical - Medication Question >> Apr 22, 2023  9:49 AM Ovid Blow wrote: Reason for CRM: patient wants [name] to give her a call back about over the counter medication.

## 2023-04-22 NOTE — Assessment & Plan Note (Signed)
 She did not tolerate trazodone , prescribed to replace diazepam.  She has resumed diazepam.  The risks and benefits of benzodiazepine use were discussed with patient today including excessive sedation leading to respiratory depression,  impaired thinking/driving, and addiction.  Patient was advised to avoid concurrent use with alcohol, to use medication only as needed and not to share with others  .

## 2023-05-13 DIAGNOSIS — H3411 Central retinal artery occlusion, right eye: Secondary | ICD-10-CM | POA: Diagnosis not present

## 2023-05-13 DIAGNOSIS — H353221 Exudative age-related macular degeneration, left eye, with active choroidal neovascularization: Secondary | ICD-10-CM | POA: Diagnosis not present

## 2023-06-02 DIAGNOSIS — D485 Neoplasm of uncertain behavior of skin: Secondary | ICD-10-CM | POA: Diagnosis not present

## 2023-06-02 DIAGNOSIS — L821 Other seborrheic keratosis: Secondary | ICD-10-CM | POA: Diagnosis not present

## 2023-06-02 DIAGNOSIS — L853 Xerosis cutis: Secondary | ICD-10-CM | POA: Diagnosis not present

## 2023-06-02 DIAGNOSIS — L2989 Other pruritus: Secondary | ICD-10-CM | POA: Diagnosis not present

## 2023-06-02 DIAGNOSIS — L538 Other specified erythematous conditions: Secondary | ICD-10-CM | POA: Diagnosis not present

## 2023-06-14 ENCOUNTER — Ambulatory Visit (INDEPENDENT_AMBULATORY_CARE_PROVIDER_SITE_OTHER)

## 2023-06-14 VITALS — Ht 66.0 in | Wt 124.0 lb

## 2023-06-14 DIAGNOSIS — Z0000 Encounter for general adult medical examination without abnormal findings: Secondary | ICD-10-CM | POA: Diagnosis not present

## 2023-06-14 NOTE — Patient Instructions (Signed)
 [name] , Thank you for taking time out of your busy schedule to complete your Annual Wellness Visit with me. I enjoyed our conversation and look forward to speaking with you again next year. I, as well as your care team,  appreciate your ongoing commitment to your health goals. Please review the following plan we discussed and let me know if I can assist you in the future. Your Game plan/ To Do List    Follow up Visits: Next Medicare AWV with our clinical staff: In 1 year    Have you seen your provider in the last 6 months (3 months if uncontrolled diabetes)? Yes Next Office Visit with your provider: To be scheduled  Clinician Recommendations:  Aim for 30 minutes of exercise or brisk walking, 6-8 glasses of water, and 5 servings of fruits and vegetables each day.       This is a list of the screening recommended for you and due dates:  Health Maintenance  Topic Date Due   DTaP/Tdap/Td vaccine (1 - Tdap) Never done   Zoster (Shingles) Vaccine (1 of 2) Never done   Pneumonia Vaccine (1 of 1 - PCV) Never done   COVID-19 Vaccine (2 - Janssen risk series) 07/29/2019   Flu Shot  08/06/2023   Medicare Annual Wellness Visit  06/13/2024   DEXA scan (bone density measurement)  Completed   HPV Vaccine  Aged Out   Meningitis B Vaccine  Aged Out    Advanced directives: (In Chart) A copy of your advanced directives are scanned into your chart should your provider ever need it.  Advance Care Planning is important because it:  [x]  Makes sure you receive the medical care that is consistent with your values, goals, and preferences  [x]  It provides guidance to your family and loved ones and reduces their decisional burden about whether or not they are making the right decisions based on your wishes.  Follow the link provided in your after visit summary or read over the paperwork we have mailed to you to help you started getting your Advance Directives in place. If you need assistance in  completing these, please reach out to us  so that we can help you!  See attachments for Preventive Care and Fall Prevention Tips.

## 2023-06-14 NOTE — Progress Notes (Signed)
 Subjective:   [name] is a 89 y.o. who presents for a Medicare Wellness preventive visit.  As a reminder, Annual Wellness Visits don't include a physical exam, and some assessments may be limited, especially if this visit is performed virtually. We may recommend an in-person follow-up visit with your provider if needed.  Visit Complete: Virtual I connected with  Hallelujah B Free on 06/14/23 by a audio enabled telemedicine application and verified that I am speaking with the correct person using two identifiers.  Patient Location: Home  Provider Location: Home Office  I discussed the limitations of evaluation and management by telemedicine. The patient expressed understanding and agreed to proceed.  Vital Signs: Because this visit was a virtual/telehealth visit, some criteria may be missing or patient reported. Any vitals not documented were not able to be obtained and vitals that have been documented are patient reported.  VideoDeclined- This patient declined Librarian, academic. Therefore the visit was completed with audio only.  Persons Participating in Visit: Patient.  AWV Questionnaire: No: Patient Medicare AWV questionnaire was not completed prior to this visit.  Cardiac Risk Factors include: advanced age (>36men, >24 women);dyslipidemia;hypertension     Objective:     Today's Vitals   06/14/23 0933  Weight: 124 lb (56.2 kg)  Height: 5\' 6"  (1.676 m)   Body mass index is 20.01 kg/m.     06/14/2023   10:00 AM 09/21/2022   11:47 AM 06/30/2022    2:27 PM 06/30/2021    6:57 PM 06/29/2021   10:27 AM 06/29/2021    9:05 AM 05/29/2021    8:44 AM  Advanced Directives  Does Patient Have a Medical Advance Directive? Yes No Yes No Yes No;Yes Yes  Type of Estate agent of [place];Living will  Healthcare Power of [place];Living will  Healthcare Power of [place];Living will  Healthcare Power of [place];Living will  Does  patient want to make changes to medical advance directive? No - Patient declined  No - Patient declined    No - Patient declined  Copy of Healthcare Power of Attorney in Chart? Yes - validated most recent copy scanned in chart (See row information)  Yes - validated most recent copy scanned in chart (See row information)    Yes - validated most recent copy scanned in chart (See row information)    Current Medications (verified) Outpatient Encounter Medications as of 06/14/2023  Medication Sig   amLODipine  (NORVASC ) 10 MG tablet Take 1 tablet (10 mg total) by mouth daily.   diazepam  (VALIUM ) 5 MG tablet TAKE 1 TABLET DAILY AS NEEDED FOR ANXIETY OR SPASM   FIBER PO Take 1 capsule by mouth in the morning, at noon, and at bedtime.   Lutein 20 MG CAPS Take 20 mg by mouth daily.   propranolol  ER (INDERAL  LA) 60 MG 24 hr capsule TAKE 1 CAPSULE BY MOUTH EVERY DAY   Propylene Glycol (SYSTANE BALANCE OP) Place 1 drop into both eyes 4 (four) times daily as needed (dry eyes).   sennosides-docusate sodium  (SENOKOT-S) 8.6-50 MG tablet Take 1 tablet by mouth daily as needed for constipation.   Vitamin E 400 units TABS Take 1 tablet by mouth daily.   No facility-administered encounter medications on file as of 06/14/2023.    Allergies (verified) Atorvastatin  and Flomax  [tamsulosin ]   History: Past Medical History:  Diagnosis Date   Anxiety    Arthritis    "may have a touch; all over I guess" (10/11/2017)  Back pain    "all my back" (10/11/2017)   Basal cell carcinoma    "have had many taken off; burned" (10/11/2017)   Chronic orthostatic hypotension 06/2010   with pprior syncopal event,  did not tolelrate florinef trial   Complication of anesthesia    2019 ws very confused for several days after anesthesia   Constipation 08/12/2013   Depression    Hyperlipidemia    Hypertension    Scalp laceration 08/26/2022   Tibia/fibula fracture 10/09/2017   Urinary frequency    Past Surgical History:   Procedure Laterality Date   ANKLE FRACTURE SURGERY Left 1998   CATARACT EXTRACTION W/ INTRAOCULAR LENS  IMPLANT, BILATERAL Bilateral    DILATION AND CURETTAGE OF UTERUS     FRACTURE SURGERY     IM NAILING TIBIA Left 10/10/2017   LAPAROSCOPIC BILATERAL SALPINGO OOPHERECTOMY Bilateral 09/02/2020   Procedure: LAPAROSCOPIC BILATERAL SALPINGO OOPHORECTOMY;  Surgeon: Schermerhorn, Joselyn Nicely, MD;  Location: [facility] ORS;  Service: Gynecology;  Laterality: Bilateral;   TIBIA IM NAIL INSERTION Left 10/10/2017   Procedure: INTRAMEDULLARY (IM) NAIL TIBIAL;  Surgeon: Wes Hamman, MD;  Location: [facility] OR;  Service: Orthopedics;  Laterality: Left;   WRIST FRACTURE SURGERY Right 2005   Family History  Problem Relation Age of Onset   Colon cancer Mother    Heart disease Father    Kidney disease Father    Cancer Neg Hx    Diabetes Neg Hx    Social History   Socioeconomic History   Marital status: Widowed    Spouse name: Not on file   Number of children: Not on file   Years of education: Not on file   Highest education level: Not on file  Occupational History   Occupation: retired    Associate Professor: RETIRED    Comment: Museum/gallery exhibitions officer  Tobacco Use   Smoking status: Never   Smokeless tobacco: Never  Vaping Use   Vaping status: Never Used  Substance and Sexual Activity   Alcohol use: Never   Drug use: Never   Sexual activity: Not Currently  Other Topics Concern   Not on file  Social History Narrative   Not on file   Social Drivers of Health   Financial Resource Strain: Low Risk  (06/14/2023)   Overall Financial Resource Strain (CARDIA)    Difficulty of Paying Living Expenses: Not hard at all  Food Insecurity: No Food Insecurity (06/14/2023)   Hunger Vital Sign    Worried About Running Out of Food in the Last Year: Never true    Ran Out of Food in the Last Year: Never true  Transportation Needs: No Transportation Needs (06/14/2023)   PRAPARE - Administrator, Civil Service  (Medical): No    Lack of Transportation (Non-Medical): No  Physical Activity: Insufficiently Active (06/14/2023)   Exercise Vital Sign    Days of Exercise per Week: 3 days    Minutes of Exercise per Session: 30 min  Stress: No Stress Concern Present (06/14/2023)   [employer] of Occupational Health - Occupational Stress Questionnaire    Feeling of Stress : Not at all  Social Connections: Moderately Integrated (06/14/2023)   Social Connection and Isolation Panel [NHANES]    Frequency of Communication with Friends and Family: More than three times a week    Frequency of Social Gatherings with Friends and Family: More than three times a week    Attends Religious Services: More than 4 times per year    Active  Member of Clubs or Organizations: Yes    Attends Banker Meetings: 1 to 4 times per year    Marital Status: Widowed    Tobacco Counseling Counseling given: Not Answered    Clinical Intake:  Pre-visit preparation completed: Yes  Pain : No/denies pain     Diabetes: No  No results found for: "HGBA1C"   How often do you need to have someone help you when you read instructions, pamphlets, or other written materials from your doctor or pharmacy?: 1 - Never  Interpreter Needed?: No  Information entered by :: Seabron Cypress LPN   Activities of Daily Living     06/14/2023   10:00 AM 06/30/2022    2:09 PM  In your present state of health, do you have any difficulty performing the following activities:  Hearing? 0 0  Comment  no issues  Vision? 0 0  Comment  blind in right eye  Difficulty concentrating or making decisions? 0 0  Walking or climbing stairs? 0 1  Dressing or bathing? 0 0  Doing errands, shopping? 0 1  Comment  niece does Psychologist, clinical and eating ? N N  Using the Toilet? N N  In the past six months, have you accidently leaked urine? N N  Do you have problems with loss of bowel control? N N  Managing your Medications? N N  Managing  your Finances? N N  Housekeeping or managing your Housekeeping? N N    Patient Care Team: Thersia Flax, MD as PCP - General (Internal Medicine)  I have updated your Care Teams any recent Medical Services you may have received from other providers in the past year.     Assessment:    This is a routine wellness examination for [place].  Hearing/Vision screen Hearing Screening - Comments:: Denies hearing difficulties    Vision Screening - Comments:: Wears rx glasses - up to date with routine eye exams with [facility]    Goals Addressed             This Visit's Progress    Remain active and independent   On track    none       Depression Screen     06/14/2023    9:59 AM 04/21/2023    3:20 PM 03/16/2023    1:35 PM 09/16/2022    2:22 PM 08/26/2022    2:08 PM 06/30/2022    2:18 PM 03/09/2022    3:24 PM  PHQ 2/9 Scores  PHQ - 2 Score 0 0 0 0 0 0 0  PHQ- 9 Score    1 0 2     Fall Risk     06/14/2023    9:59 AM 09/16/2022    2:22 PM 08/26/2022    2:08 PM 06/30/2022    2:13 PM 03/09/2022    3:24 PM  Fall Risk   Falls in the past year? 0 1 0 0 0  Number falls in past yr: 0 0 0 0 0  Injury with Fall? 0 1 0 0 0  Risk for fall due to : No Fall Risks History of fall(s) No Fall Risks No Fall Risks No Fall Risks  Follow up Falls prevention discussed;Education provided;Falls evaluation completed Falls evaluation completed Falls evaluation completed Falls prevention discussed;Falls evaluation completed;Education provided Falls evaluation completed    MEDICARE RISK AT HOME:  Medicare Risk at Home Any stairs in or around the home?: No If so, are there any  without handrails?: No Home free of loose throw rugs in walkways, pet beds, electrical cords, etc?: Yes Adequate lighting in your home to reduce risk of falls?: Yes Life alert?: No Use of a cane, walker or w/c?: No Grab bars in the bathroom?: Yes Shower chair or bench in shower?: No Elevated toilet seat or a handicapped  toilet?: Yes  TIMED UP AND GO:  Was the test performed?  No  Cognitive Function: 6CIT completed        06/14/2023   10:00 AM 06/30/2022    2:28 PM 05/27/2020    4:10 PM 05/25/2019   12:01 PM  6CIT Screen  What Year? 0 points 0 points 0 points 0 points  What month? 0 points 0 points 0 points 0 points  What time? 0 points 0 points 0 points 0 points  Count back from 20 0 points 2 points 0 points 0 points  Months in reverse 2 points 0 points 0 points 0 points  Repeat phrase 0 points 2 points    Total Score 2 points 4 points      Immunizations Immunization History  Administered Date(s) Administered   Janssen (J&J) SARS-COV-2 Vaccination 07/01/2019    Screening Tests Health Maintenance  Topic Date Due   DTaP/Tdap/Td (1 - Tdap) Never done   Zoster Vaccines- Shingrix (1 of 2) Never done   Pneumonia Vaccine 62+ Years old (1 of 1 - PCV) Never done   COVID-19 Vaccine (2 - Janssen risk series) 07/29/2019   INFLUENZA VACCINE  08/06/2023   Medicare Annual Wellness (AWV)  06/13/2024   DEXA SCAN  Completed   HPV VACCINES  Aged Out   Meningococcal B Vaccine  Aged Out    Health Maintenance  Health Maintenance Due  Topic Date Due   DTaP/Tdap/Td (1 - Tdap) Never done   Zoster Vaccines- Shingrix (1 of 2) Never done   Pneumonia Vaccine 47+ Years old (1 of 1 - PCV) Never done   COVID-19 Vaccine (2 - Janssen risk series) 07/29/2019   Health Maintenance Items Addressed: Declines vaccines   Additional Screening:  Vision Screening: Recommended annual ophthalmology exams for early detection of glaucoma and other disorders of the eye. Would you like a referral to an eye doctor? No    Dental Screening: Recommended annual dental exams for proper oral hygiene  Community Resource Referral / Chronic Care Management: CRR required this visit?  No   CCM required this visit?  No   Plan:    I have personally reviewed and noted the following in the patient's chart:   Medical and social  history Use of alcohol, tobacco or illicit drugs  Current medications and supplements including opioid prescriptions. Patient is not currently taking opioid prescriptions. Functional ability and status Nutritional status Physical activity Advanced directives List of other physicians Hospitalizations, surgeries, and ER visits in previous 12 months Vitals Screenings to include cognitive, depression, and falls Referrals and appointments  In addition, I have reviewed and discussed with patient certain preventive protocols, quality metrics, and best practice recommendations. A written personalized care plan for preventive services as well as general preventive health recommendations were provided to patient.   Seabron Cypress [place], [place]   02/13/5186   After Visit Summary: (Declined) Due to this being a telephonic visit, with patients personalized plan was offered to patient but patient Declined AVS at this time   Notes: Nothing significant to report at this time.

## 2023-06-30 DIAGNOSIS — D485 Neoplasm of uncertain behavior of skin: Secondary | ICD-10-CM | POA: Diagnosis not present

## 2023-07-19 DIAGNOSIS — Z79899 Other long term (current) drug therapy: Secondary | ICD-10-CM | POA: Diagnosis not present

## 2023-07-19 DIAGNOSIS — Z7982 Long term (current) use of aspirin: Secondary | ICD-10-CM | POA: Diagnosis not present

## 2023-07-19 DIAGNOSIS — J9811 Atelectasis: Secondary | ICD-10-CM | POA: Diagnosis not present

## 2023-07-19 DIAGNOSIS — E785 Hyperlipidemia, unspecified: Secondary | ICD-10-CM | POA: Diagnosis not present

## 2023-07-19 DIAGNOSIS — M419 Scoliosis, unspecified: Secondary | ICD-10-CM | POA: Diagnosis not present

## 2023-07-19 DIAGNOSIS — Z8673 Personal history of transient ischemic attack (TIA), and cerebral infarction without residual deficits: Secondary | ICD-10-CM | POA: Diagnosis not present

## 2023-07-19 DIAGNOSIS — I252 Old myocardial infarction: Secondary | ICD-10-CM | POA: Diagnosis not present

## 2023-07-19 DIAGNOSIS — K5900 Constipation, unspecified: Secondary | ICD-10-CM | POA: Diagnosis not present

## 2023-07-23 ENCOUNTER — Emergency Department

## 2023-07-23 ENCOUNTER — Other Ambulatory Visit: Payer: Self-pay

## 2023-07-23 ENCOUNTER — Encounter: Payer: Self-pay | Admitting: Emergency Medicine

## 2023-07-23 ENCOUNTER — Inpatient Hospital Stay
Admission: EM | Admit: 2023-07-23 | Discharge: 2023-07-26 | DRG: 690 | Disposition: A | Discharge status: Home Health | Attending: Internal Medicine | Admitting: Internal Medicine

## 2023-07-23 DIAGNOSIS — Z8249 Family history of ischemic heart disease and other diseases of the circulatory system: Secondary | ICD-10-CM | POA: Diagnosis not present

## 2023-07-23 DIAGNOSIS — N390 Urinary tract infection, site not specified: Secondary | ICD-10-CM | POA: Diagnosis not present

## 2023-07-23 DIAGNOSIS — K5900 Constipation, unspecified: Secondary | ICD-10-CM | POA: Diagnosis not present

## 2023-07-23 DIAGNOSIS — Z66 Do not resuscitate: Secondary | ICD-10-CM | POA: Diagnosis not present

## 2023-07-23 DIAGNOSIS — E785 Hyperlipidemia, unspecified: Secondary | ICD-10-CM | POA: Diagnosis present

## 2023-07-23 DIAGNOSIS — Z79899 Other long term (current) drug therapy: Secondary | ICD-10-CM

## 2023-07-23 DIAGNOSIS — I129 Hypertensive chronic kidney disease with stage 1 through stage 4 chronic kidney disease, or unspecified chronic kidney disease: Secondary | ICD-10-CM | POA: Diagnosis present

## 2023-07-23 DIAGNOSIS — I10 Essential (primary) hypertension: Secondary | ICD-10-CM | POA: Diagnosis present

## 2023-07-23 DIAGNOSIS — R531 Weakness: Secondary | ICD-10-CM | POA: Diagnosis not present

## 2023-07-23 DIAGNOSIS — R7881 Bacteremia: Secondary | ICD-10-CM | POA: Diagnosis not present

## 2023-07-23 DIAGNOSIS — D638 Anemia in other chronic diseases classified elsewhere: Secondary | ICD-10-CM | POA: Insufficient documentation

## 2023-07-23 DIAGNOSIS — B9620 Unspecified Escherichia coli [E. coli] as the cause of diseases classified elsewhere: Secondary | ICD-10-CM | POA: Diagnosis present

## 2023-07-23 DIAGNOSIS — E860 Dehydration: Secondary | ICD-10-CM | POA: Diagnosis not present

## 2023-07-23 DIAGNOSIS — N1831 Chronic kidney disease, stage 3a: Secondary | ICD-10-CM | POA: Diagnosis not present

## 2023-07-23 DIAGNOSIS — Z602 Problems related to living alone: Secondary | ICD-10-CM | POA: Diagnosis present

## 2023-07-23 DIAGNOSIS — F419 Anxiety disorder, unspecified: Secondary | ICD-10-CM | POA: Diagnosis not present

## 2023-07-23 DIAGNOSIS — Z8673 Personal history of transient ischemic attack (TIA), and cerebral infarction without residual deficits: Secondary | ICD-10-CM

## 2023-07-23 DIAGNOSIS — A419 Sepsis, unspecified organism: Secondary | ICD-10-CM

## 2023-07-23 DIAGNOSIS — E876 Hypokalemia: Secondary | ICD-10-CM | POA: Diagnosis not present

## 2023-07-23 DIAGNOSIS — Z85828 Personal history of other malignant neoplasm of skin: Secondary | ICD-10-CM | POA: Diagnosis not present

## 2023-07-23 DIAGNOSIS — N3000 Acute cystitis without hematuria: Secondary | ICD-10-CM | POA: Diagnosis not present

## 2023-07-23 DIAGNOSIS — K5909 Other constipation: Secondary | ICD-10-CM | POA: Diagnosis not present

## 2023-07-23 DIAGNOSIS — R5383 Other fatigue: Secondary | ICD-10-CM

## 2023-07-23 DIAGNOSIS — D631 Anemia in chronic kidney disease: Secondary | ICD-10-CM | POA: Diagnosis present

## 2023-07-23 DIAGNOSIS — D649 Anemia, unspecified: Secondary | ICD-10-CM | POA: Insufficient documentation

## 2023-07-23 LAB — BLOOD CULTURE ID PANEL (REFLEXED) - BCID2

## 2023-07-23 LAB — CBC WITH DIFFERENTIAL/PLATELET
Abs Immature Granulocytes: 0.69 K/uL — ABNORMAL HIGH (ref 0.00–0.07)
Basophils Absolute: 0.1 K/uL (ref 0.0–0.1)
Basophils Relative: 0 %
Eosinophils Absolute: 0.1 K/uL (ref 0.0–0.5)
Eosinophils Relative: 1 %
HCT: 27.5 % — ABNORMAL LOW (ref 36.0–46.0)
Hemoglobin: 9.8 g/dL — ABNORMAL LOW (ref 12.0–15.0)
Immature Granulocytes: 4 %
Lymphocytes Relative: 6 %
Lymphs Abs: 1.1 K/uL (ref 0.7–4.0)
MCH: 33.2 pg (ref 26.0–34.0)
MCHC: 35.6 g/dL (ref 30.0–36.0)
MCV: 93.2 fL (ref 80.0–100.0)
Monocytes Absolute: 1.3 K/uL — ABNORMAL HIGH (ref 0.1–1.0)
Monocytes Relative: 6 %
Neutro Abs: 16.3 K/uL — ABNORMAL HIGH (ref 1.7–7.7)
Neutrophils Relative %: 83 %
Platelets: 364 K/uL (ref 150–400)
RBC: 2.95 MIL/uL — ABNORMAL LOW (ref 3.87–5.11)
RDW: 13.9 % (ref 11.5–15.5)
WBC: 19.5 K/uL — ABNORMAL HIGH (ref 4.0–10.5)
nRBC: 0 % (ref 0.0–0.2)

## 2023-07-23 LAB — HEPATIC FUNCTION PANEL
ALT: 10 U/L (ref 0–44)
AST: 16 U/L (ref 15–41)
Albumin: 2.6 g/dL — ABNORMAL LOW (ref 3.5–5.0)
Alkaline Phosphatase: 52 U/L (ref 38–126)
Bilirubin, Direct: 0.2 mg/dL (ref 0.0–0.2)
Indirect Bilirubin: 0.9 mg/dL (ref 0.3–0.9)
Total Bilirubin: 1.1 mg/dL (ref 0.0–1.2)
Total Protein: 6.3 g/dL — ABNORMAL LOW (ref 6.5–8.1)

## 2023-07-23 LAB — URINALYSIS, ROUTINE W REFLEX MICROSCOPIC
Bilirubin Urine: NEGATIVE
Glucose, UA: NEGATIVE mg/dL
Ketones, ur: NEGATIVE mg/dL
Nitrite: POSITIVE — AB
Protein, ur: 100 mg/dL — AB
Specific Gravity, Urine: 1.011 (ref 1.005–1.030)
WBC, UA: >50 WBC/hpf (ref 0–5)
pH: 6 (ref 5.0–8.0)

## 2023-07-23 LAB — BASIC METABOLIC PANEL WITH GFR
Anion gap: 8 (ref 5–15)
BUN: 20 mg/dL (ref 8–23)
CO2: 24 mmol/L (ref 22–32)
Calcium: 8.8 mg/dL — ABNORMAL LOW (ref 8.9–10.3)
Chloride: 102 mmol/L (ref 98–111)
Creatinine, Ser: 1 mg/dL (ref 0.44–1.00)
GFR, Estimated: 52 mL/min — ABNORMAL LOW (ref >60)
Glucose, Bld: 108 mg/dL — ABNORMAL HIGH (ref 70–99)
Potassium: 3.3 mmol/L — ABNORMAL LOW (ref 3.5–5.1)
Sodium: 134 mmol/L — ABNORMAL LOW (ref 135–145)

## 2023-07-23 LAB — TROPONIN I (HIGH SENSITIVITY)
Troponin I (High Sensitivity): 22 ng/L — ABNORMAL HIGH (ref <18)
Troponin I (High Sensitivity): 17 ng/L (ref <18)

## 2023-07-23 LAB — LACTIC ACID, PLASMA: Lactic Acid, Venous: 1 mmol/L (ref 0.5–1.9)

## 2023-07-23 MED ORDER — ACETAMINOPHEN 650 MG RE SUPP: 650.0000 mg | Freq: Four times a day (QID) | RECTAL | Status: DC | PRN

## 2023-07-23 MED ORDER — ONDANSETRON HCL 4 MG PO TABS: 4.0000 mg | ORAL_TABLET | Freq: Four times a day (QID) | ORAL | Status: DC | PRN

## 2023-07-23 MED ORDER — PIPERACILLIN-TAZOBACTAM 3.375 G IVPB 30 MIN: 3.3750 g | Freq: Three times a day (TID) | INTRAVENOUS | Status: DC

## 2023-07-23 MED ORDER — PROPRANOLOL HCL ER 60 MG PO CP24
60.0000 mg | ORAL_CAPSULE | Freq: Every day | ORAL | Status: DC
  Administered 2023-07-23 – 2023-07-26 (×4): 60 mg via ORAL
  Filled 2023-07-23 (×5): qty 1

## 2023-07-23 MED ORDER — ONDANSETRON HCL 4 MG/2ML IJ SOLN: 4.0000 mg | Freq: Four times a day (QID) | INTRAMUSCULAR | Status: DC | PRN

## 2023-07-23 MED ORDER — ENOXAPARIN SODIUM 40 MG/0.4ML IJ SOSY
40.0000 mg | PREFILLED_SYRINGE | INTRAMUSCULAR | Status: DC
  Administered 2023-07-23 – 2023-07-25 (×3): 40 mg via SUBCUTANEOUS
  Filled 2023-07-23 (×3): qty 0.4

## 2023-07-23 MED ORDER — LACTATED RINGERS IV BOLUS
1000.0000 mL | Freq: Once | INTRAVENOUS | Status: AC
  Administered 2023-07-23: 1000 mL via INTRAVENOUS

## 2023-07-23 MED ORDER — AMLODIPINE BESYLATE 10 MG PO TABS
10.0000 mg | ORAL_TABLET | Freq: Every day | ORAL | Status: DC
Start: 2023-07-23 — End: 2023-07-24
  Administered 2023-07-23: 10 mg via ORAL
  Filled 2023-07-23: qty 1
  Filled 2023-07-23: qty 2

## 2023-07-23 MED ORDER — ACETAMINOPHEN 325 MG PO TABS
650.0000 mg | ORAL_TABLET | Freq: Four times a day (QID) | ORAL | Status: DC | PRN
  Administered 2023-07-23: 650 mg via ORAL
  Filled 2023-07-23: qty 2

## 2023-07-23 MED ORDER — SENNOSIDES-DOCUSATE SODIUM 8.6-50 MG PO TABS: 1.0000 | ORAL_TABLET | Freq: Every day | ORAL | Status: DC | PRN

## 2023-07-23 MED ORDER — PIPERACILLIN-TAZOBACTAM 3.375 G IVPB 30 MIN
3.3750 g | Freq: Once | INTRAVENOUS | Status: AC
  Administered 2023-07-23: 3.375 g via INTRAVENOUS
  Filled 2023-07-23 (×2): qty 50

## 2023-07-23 MED ORDER — PIPERACILLIN-TAZOBACTAM 3.375 G IVPB
3.3750 g | Freq: Three times a day (TID) | INTRAVENOUS | Status: DC
  Administered 2023-07-23 – 2023-07-24 (×2): 3.375 g via INTRAVENOUS
  Filled 2023-07-23 (×2): qty 50

## 2023-07-23 NOTE — ED Notes (Signed)
Informed RN bed assigned 

## 2023-07-23 NOTE — ED Provider Notes (Signed)
 [facility] Provider Note    Event Date/Time   First MD Initiated Contact with Patient 07/23/23 0703     (approximate)   History   Chief Complaint Weakness   HPI  [name] is a 89 y.o. female with past medical history of hypertension, hyperlipidemia, and stroke who presents to the ED complaining of weakness.  Patient reports that she has been feeling increasingly weak over the past 7 days, denies any weakness to specific parts of her body.  She denies any fevers, cough, chest pain, shortness of breath, dysuria, or flank pain.  She also denies any abdominal pain, nausea, vomiting, or diarrhea.  She does report being seen in the [facility] ED 4 days ago for constipation, received an enema at that time and has not had further constipation.  She currently lives alone and does not have anyone that comes to help her.      Physical Exam   Triage Vital Signs: ED Triage Vitals  Encounter Vitals Group     BP 07/23/23 0623 (!) 137/59     Girls Systolic BP Percentile --      Girls Diastolic BP Percentile --      Boys Systolic BP Percentile --      Boys Diastolic BP Percentile --      Pulse Rate 07/23/23 0623 94     Resp 07/23/23 0623 18     Temp 07/23/23 0623 98.5 F (36.9 C)     Temp Source 07/23/23 0623 Oral     SpO2 07/23/23 0623 95 %     Weight 07/23/23 0624 125 lb 11.2 oz (57 kg)     Height 07/23/23 0624 5' 6 (1.676 m)     Head Circumference --      Peak Flow --      Pain Score 07/23/23 0624 0     Pain Loc --      Pain Education --      Exclude from Growth Chart --     Most recent vital signs: Vitals:   07/23/23 1030 07/23/23 1048  BP: (!) 127/49   Pulse: 64   Resp: (!) 24   Temp:  98.4 F (36.9 C)  SpO2: 90%     Constitutional: Alert and oriented. Eyes: Conjunctivae are normal. Head: Atraumatic. Nose: No congestion/rhinnorhea. Mouth/Throat: Mucous membranes are moist.  Cardiovascular: Normal rate, regular rhythm. Grossly normal  heart sounds.  2+ radial pulses bilaterally. Respiratory: Normal respiratory effort.  No retractions. Lungs CTAB. Gastrointestinal: Soft and nontender. No distention. Musculoskeletal: No lower extremity tenderness nor edema.  Neurologic:  Normal speech and language. No gross focal neurologic deficits are appreciated.    ED Results / Procedures / Treatments   Labs (all labs ordered are listed, but only abnormal results are displayed) Labs Reviewed  CBC WITH DIFFERENTIAL/PLATELET - Abnormal; Notable for the following components:      Result Value   WBC 19.5 (*)    RBC 2.95 (*)    Hemoglobin 9.8 (*)    HCT 27.5 (*)    Neutro Abs 16.3 (*)    Monocytes Absolute 1.3 (*)    Abs Immature Granulocytes 0.69 (*)    All other components within normal limits  BASIC METABOLIC PANEL WITH GFR - Abnormal; Notable for the following components:   Sodium 134 (*)    Potassium 3.3 (*)    Glucose, Bld 108 (*)    Calcium  8.8 (*)    GFR, Estimated 52 (*)  All other components within normal limits  HEPATIC FUNCTION PANEL - Abnormal; Notable for the following components:   Total Protein 6.3 (*)    Albumin 2.6 (*)    All other components within normal limits  URINALYSIS, ROUTINE W REFLEX MICROSCOPIC - Abnormal; Notable for the following components:   Color, Urine YELLOW (*)    APPearance CLOUDY (*)    Hgb urine dipstick MODERATE (*)    Protein, ur 100 (*)    Nitrite POSITIVE (*)    Leukocytes,Ua LARGE (*)    Bacteria, UA MANY (*)    All other components within normal limits  TROPONIN I (HIGH SENSITIVITY) - Abnormal; Notable for the following components:   Troponin I (High Sensitivity) 22 (*)    All other components within normal limits  CULTURE, BLOOD (ROUTINE X 2)  CULTURE, BLOOD (ROUTINE X 2)  URINE CULTURE  LACTIC ACID, PLASMA  TROPONIN I (HIGH SENSITIVITY)     EKG  ED ECG REPORT I, Carlin Palin, the attending physician, personally viewed and interpreted this ECG.   Date:  07/23/2023  EKG Time: 8:25  Rate: 63  Rhythm: normal sinus rhythm  Axis: Normal  Intervals:none  ST&T Change: None  RADIOLOGY Chest x-ray reviewed and interpreted by me with no infiltrate, edema, or effusion.  PROCEDURES:  Critical Care performed: No  Procedures   MEDICATIONS ORDERED IN ED: Medications  piperacillin-tazobactam (ZOSYN) IVPB 3.375 g (3.375 g Intravenous New Bag/Given 07/23/23 1219)  lactated ringers  bolus 1,000 mL (0 mLs Intravenous Stopped 07/23/23 1130)     IMPRESSION / MDM / ASSESSMENT AND PLAN / ED COURSE  I reviewed the triage vital signs and the nursing notes.                              89 y.o. female with past medical history of hypertension, hyperlipidemia, and stroke who presents to the ED with 1 week of increasing generalized weakness.  Patient's presentation is most consistent with acute presentation with potential threat to life or bodily function.  Differential diagnosis includes, but is not limited to, sepsis, UTI, pneumonia, arrhythmia, ACS, anemia, electrolyte abnormality, AKI, stroke.  Patient nontoxic-appearing and in no acute distress, vital signs are reassuring and do not appear concerning for sepsis.  Patient does have significant leukocytosis with white count of 19, labs without significant anemia, electrolyte abnormality, or AKI.  We will initiate sepsis workup given concern for infection, chest x-ray and urinalysis are pending at this time.  She is alert and oriented x 4 with no focal neurologic deficits, low suspicion for intracranial process.  Chest x-ray is unremarkable, urinalysis does appear concerning for UTI and we will send for culture.  She has previously grown Pseudomonas resistant to Levaquin and we will treat with IV Zosyn for now.  Additional labs with lactic acid of 1.0, LFTs unremarkable.  Case discussed with hospitalist for admission.      FINAL CLINICAL IMPRESSION(S) / ED DIAGNOSES   Final diagnoses:  Generalized  weakness  Acute cystitis without hematuria     Rx / DC Orders   ED Discharge Orders     None        Note:  This document was prepared using Dragon voice recognition software and may include unintentional dictation errors.   Palin Carlin, MD 07/23/23 [phone]

## 2023-07-23 NOTE — Consult Note (Signed)
 Pharmacy Antibiotic Note  [name] is a 89 y.o. female with PMH including HTN, HLD, and history of basal cell carcinoma admitted on 07/23/2023 with weakness concerning for sepsis/UTI. Pharmacy has been consulted for piperacillin-tazobactam dosing.  Assessment: Patient is afebrile, tachypneic, hypotensive, with elevated WBC at 19.5. Denies any abdominal pain, nausea, vomiting, diarrhea, dysuria, or flank pain. Patient has previously grown Pseudomonas resistant to Levaquin. Chest X-ray shows no infiltrate, edema, or effusion. Scr is stable at 1 (baseline 0.9-1.0) and CrCl of 31 mL/min. A dose of piperacillin-tazobactam 3.375g was given at 1200.  Plan: Initiate piperacillin/tazobactam 3.375g every 8 hours Monitor renal function to assess for dose adjustments Follow-up with cultures to assess for antibiotic optimization   Height: 5' 6 (167.6 cm) Weight: 57 kg (125 lb 11.2 oz) IBW/kg (Calculated) : 59.3  Temp (24hrs), Avg:98.5 F (36.9 C), Min:98.4 F (36.9 C), Max:98.5 F (36.9 C)  Recent Labs  Lab 07/23/23 0637 07/23/23 0826  WBC 19.5*  --   CREATININE 1.00  --   LATICACIDVEN  --  1.0    Estimated Creatinine Clearance: 31 mL/min (by C-G formula based on SCr of 1 mg/dL).    Allergies  Allergen Reactions   Atorvastatin      Other reaction(s): Other (See Comments) Myopathy, transaminitis   Flomax  [Tamsulosin ] Other (See Comments)    Syncope     Antimicrobials this admission: 7/18 piperacillin/tazobactam >>   Dose adjustments this admission: N/A  Microbiology results: 7/18 Bcx x 2: pending 7/18 UCx: pending   Thank you for allowing pharmacy to be a part of this patient's care.  Mardy LOISE Lime PharmD Candidate 07/23/2023 2:52 PM

## 2023-07-23 NOTE — H&P (Signed)
 History and Physical    [name] FMW:[id number] DOB: 07-Sep-1926 DOA: 07/23/2023  DOS: the patient was seen and examined on 07/23/2023  PCP: Marylynn Verneita CROME, MD   Patient coming from: Home  I have personally briefly reviewed patient's old medical records in [facility]  Chief Complaint: Generalized weakness/not feeling well  HPI: [name] is a pleasant 89 y.o. female with medical history significant for anxiety, HTN, HLD and history of basal cell carcinoma who presented to ED complaining of generalized weakness.  Patient reported that she had been feeling increasingly weak over the last 7 days.  She denies any weakness to specific parts of the body.  She denies any fever or chills.  She denies any chest pain palpitations shortness of breath dysuria or flank pain.  She denies any hematemesis or melena.  She reports that she was at [facility] ED 4 days ago for constipation where she was given an enema and some fluid and was discharged home.  She lives alone and does not have anyone to come to her house for help.  ED Course: Upon arrival to the ED, patient is found to be in the ED patient was found to have positive urine for urinary tract infection, tachypnea and generalized weakness, hypokalemia and leukocytosis and hospitalist service was consulted for evaluation for admission for possible UTI.  Review of Systems:  ROS  All other systems negative except as noted in the HPI.  Past Medical History:  Diagnosis Date   Anxiety    Arthritis    may have a touch; all over I guess (10/11/2017)   Back pain    all my back (10/11/2017)   Basal cell carcinoma    have had many taken off; burned (10/11/2017)   Chronic orthostatic hypotension 06/2010   with pprior syncopal event,  did not tolelrate florinef trial   Complication of anesthesia    2019 ws very confused for several days after anesthesia   Constipation 08/12/2013   Depression    Hyperlipidemia    Hypertension     Scalp laceration 08/26/2022   Tibia/fibula fracture 10/09/2017   Urinary frequency     Past Surgical History:  Procedure Laterality Date   ANKLE FRACTURE SURGERY Left 1998   CATARACT EXTRACTION W/ INTRAOCULAR LENS  IMPLANT, BILATERAL Bilateral    DILATION AND CURETTAGE OF UTERUS     FRACTURE SURGERY     IM NAILING TIBIA Left 10/10/2017   LAPAROSCOPIC BILATERAL SALPINGO OOPHERECTOMY Bilateral 09/02/2020   Procedure: LAPAROSCOPIC BILATERAL SALPINGO OOPHORECTOMY;  Surgeon: Schermerhorn, Debby PARAS, MD;  Location: [facility] ORS;  Service: Gynecology;  Laterality: Bilateral;   TIBIA IM NAIL INSERTION Left 10/10/2017   Procedure: INTRAMEDULLARY (IM) NAIL TIBIAL;  Surgeon: Jerri Kay HERO, MD;  Location: [facility] OR;  Service: Orthopedics;  Laterality: Left;   WRIST FRACTURE SURGERY Right 2005     reports that she has never smoked. She has never used smokeless tobacco. She reports that she does not drink alcohol and does not use drugs.  Allergies  Allergen Reactions   Atorvastatin      Other reaction(s): Other (See Comments) Myopathy, transaminitis   Flomax  [Tamsulosin ] Other (See Comments)    Syncope     Family History  Problem Relation Age of Onset   Colon cancer Mother    Heart disease Father    Kidney disease Father    Cancer Neg Hx    Diabetes Neg Hx     Prior to Admission medications   Medication  Sig Start Date End Date Taking? Authorizing Provider  amLODipine  (NORVASC ) 10 MG tablet Take 1 tablet (10 mg total) by mouth daily. 04/21/23   Marylynn Verneita CROME, MD  diazepam  (VALIUM ) 5 MG tablet TAKE 1 TABLET DAILY AS NEEDED FOR ANXIETY OR SPASM 03/16/23   Marylynn Verneita CROME, MD  FIBER PO Take 1 capsule by mouth in the morning, at noon, and at bedtime.    [provider]  Lutein 20 MG CAPS Take 20 mg by mouth daily.    [provider]  propranolol  ER (INDERAL  LA) 60 MG 24 hr capsule TAKE 1 CAPSULE BY MOUTH EVERY DAY 03/16/23   Marylynn Verneita CROME, MD  Propylene Glycol (SYSTANE BALANCE  OP) Place 1 drop into both eyes 4 (four) times daily as needed (dry eyes).    [provider]  sennosides-docusate sodium  (SENOKOT-S) 8.6-50 MG tablet Take 1 tablet by mouth daily as needed for constipation. 10/14/17   [provider]  Vitamin E 400 units TABS Take 1 tablet by mouth daily. 06/12/21   [provider]    Physical Exam: Vitals:   07/23/23 0930 07/23/23 1000 07/23/23 1030 07/23/23 1048  BP: (!) 126/43 (!) 124/51 (!) 127/49   Pulse: 63 63 64   Resp: 19 (!) 23 (!) 24   Temp:    98.4 F (36.9 C)  TempSrc:    Oral  SpO2: 94% 94% 90%   Weight:      Height:        Physical Exam   Constitutional: Alert, awake, calm, comfortable HEENT: Neck supple Respiratory: Clear to auscultation B/L, no wheezing, no rales.  Cardiovascular: Regular rate and rhythm, no murmurs / rubs / gallops. No extremity edema. 2+ pedal pulses. No carotid bruits.  Abdomen: Soft, no tenderness, Bowel sounds positive.  Musculoskeletal: no clubbing / cyanosis. Good ROM, no contractures. Normal muscle tone.  Skin: no rashes, lesions, ulcers. Neurologic: CN 2-12 grossly intact. Sensation intact, No focal deficit identified Psychiatric: Alert and oriented x 3. Normal mood.    Labs on Admission: I have personally reviewed following labs and imaging studies  CBC: Recent Labs  Lab 07/23/23 0637  WBC 19.5*  NEUTROABS 16.3*  HGB 9.8*  HCT 27.5*  MCV 93.2  PLT 364   Basic Metabolic Panel: Recent Labs  Lab 07/23/23 0637  NA 134*  K 3.3*  CL 102  CO2 24  GLUCOSE 108*  BUN 20  CREATININE 1.00  CALCIUM  8.8*   GFR: Estimated Creatinine Clearance: 31 mL/min (by C-G formula based on SCr of 1 mg/dL). Liver Function Tests: Recent Labs  Lab 07/23/23 0825  AST 16  ALT 10  ALKPHOS 52  BILITOT 1.1  PROT 6.3*  ALBUMIN 2.6*   No results for input(s): LIPASE, AMYLASE in the last 168 hours. No results for input(s): AMMONIA in the last 168 hours. Coagulation  Profile: No results for input(s): INR, PROTIME in the last 168 hours. Cardiac Enzymes: Recent Labs  Lab 07/23/23 0825  TROPONINIHS 22*    Urine analysis:    Component Value Date/Time   COLORURINE YELLOW (A) 07/23/2023 1129   APPEARANCEUR CLOUDY (A) 07/23/2023 1129   APPEARANCEUR Clear 11/12/2022 1302   LABSPEC 1.011 07/23/2023 1129   PHURINE 6.0 07/23/2023 1129   GLUCOSEU NEGATIVE 07/23/2023 1129   GLUCOSEU NEGATIVE 04/21/2023 1533   HGBUR MODERATE (A) 07/23/2023 1129   BILIRUBINUR NEGATIVE 07/23/2023 1129   BILIRUBINUR Negative 11/12/2022 1302   KETONESUR NEGATIVE 07/23/2023 1129   PROTEINUR 100 (A)  07/23/2023 1129   UROBILINOGEN 0.2 04/21/2023 1533   NITRITE POSITIVE (A) 07/23/2023 1129   LEUKOCYTESUR LARGE (A) 07/23/2023 1129    Radiological Exams on Admission: I have personally reviewed images DG Chest 2 View Result Date: 07/23/2023 CLINICAL DATA:  Weakness. EXAM: CHEST - 2 VIEW COMPARISON:  October 09, 2017. FINDINGS: The heart size and mediastinal contours are within normal limits. Mild bibasilar subsegmental atelectasis or scarring is noted. The visualized skeletal structures are unremarkable. IMPRESSION: Mild bibasilar subsegmental atelectasis or scarring. Electronically Signed   By: Lynwood Landy Raddle M.D.   On: 07/23/2023 08:29    EKG: My personal interpretation of EKG shows: Sinus rhythm at 63 bpm    Assessment/Plan Active Problems:   Essential hypertension   Anxiety   Hyperlipidemia LDL goal <130   Constipation   UTI (urinary tract infection)    Assessment and Plan:  89 year old female with past medical history of HTN, HLD, history of stroke without any residual, anxiety, constipation admitted for generalized weakness due to urinary tract infection.  1.  UTI - She will be admitted to the hospital as she is tachypneic and very weak. - Due to her age and other medical conditions, she is at risk for worsening her condition. - She had a history of  Pseudomonas urinary tract infection in the past.  She was given Zosyn in the emergency room and will continue Zosyn at this point until we have culture report available. - Patient appears to be slightly dehydrated, will give her IV fluid gentle hydration. - Patient received 1 L of IV fluid in the emergency room.  2.  Deconditioning - Likely due to medical illness and urinary tract infection - Will give her physical and Occupational Therapy evaluation  3.  HTN/HLD - Blood pressure is stable - Will continue her amlodipine  and propranolol . - It does not appear that she was taking any antilipid medications.  4.  Constipation - Will place her on bowel regimen.   DVT prophylaxis: Lovenox  Code Status: DNR/DNI(Do NOT Intubate) patient is alert awake and oriented and wanted to not resuscitate and DO NOT INTUBATE Family Communication: No family was available Disposition Plan: Home versus skilled Consults called: None Admission status: Inpatient, Med-Surg   Nena Rebel, MD [facility] 07/23/2023, 1:51 PM

## 2023-07-23 NOTE — Progress Notes (Signed)
 PHARMACY - PHYSICIAN COMMUNICATION CRITICAL VALUE ALERT - BLOOD CULTURE IDENTIFICATION (BCID)  [name] is an 89 y.o. female who presented to [facility] on 07/23/2023 with a chief complaint of UTI, hx of pseudomonas   Assessment:  E Coli in 1 of 4 bottles , no resistance  (include suspected source if known)  Name of physician (or Provider) Contacted: Erminio Cone, NP   Current antibiotics: Zosyn   Changes to prescribed antibiotics recommended:  Patient is on recommended antibiotics - No changes needed   - hx of pseudomonas so continue zosyn   Results for orders placed or performed during the hospital encounter of 07/23/23  Blood Culture ID Panel (Reflexed) (Collected: 07/23/2023  8:26 AM)  Result Value Ref Range   Enterococcus faecalis NOT DETECTED NOT DETECTED   Enterococcus Faecium NOT DETECTED NOT DETECTED   Listeria monocytogenes NOT DETECTED NOT DETECTED   Staphylococcus species NOT DETECTED NOT DETECTED   Staphylococcus aureus (BCID) NOT DETECTED NOT DETECTED   Staphylococcus epidermidis NOT DETECTED NOT DETECTED   Staphylococcus lugdunensis NOT DETECTED NOT DETECTED   Streptococcus species NOT DETECTED NOT DETECTED   Streptococcus agalactiae NOT DETECTED NOT DETECTED   Streptococcus pneumoniae NOT DETECTED NOT DETECTED   Streptococcus pyogenes NOT DETECTED NOT DETECTED   A.calcoaceticus-baumannii NOT DETECTED NOT DETECTED   Bacteroides fragilis NOT DETECTED NOT DETECTED   Enterobacterales DETECTED (A) NOT DETECTED   Enterobacter cloacae complex NOT DETECTED NOT DETECTED   Escherichia coli DETECTED (A) NOT DETECTED   Klebsiella aerogenes NOT DETECTED NOT DETECTED   Klebsiella oxytoca NOT DETECTED NOT DETECTED   Klebsiella pneumoniae NOT DETECTED NOT DETECTED   Proteus species NOT DETECTED NOT DETECTED   Salmonella species NOT DETECTED NOT DETECTED   Serratia marcescens NOT DETECTED NOT DETECTED   Haemophilus influenzae NOT DETECTED NOT DETECTED   Neisseria  meningitidis NOT DETECTED NOT DETECTED   Pseudomonas aeruginosa NOT DETECTED NOT DETECTED   Stenotrophomonas maltophilia NOT DETECTED NOT DETECTED   Candida albicans NOT DETECTED NOT DETECTED   Candida auris NOT DETECTED NOT DETECTED   Candida glabrata NOT DETECTED NOT DETECTED   Candida krusei NOT DETECTED NOT DETECTED   Candida parapsilosis NOT DETECTED NOT DETECTED   Candida tropicalis NOT DETECTED NOT DETECTED   Cryptococcus neoformans/gattii NOT DETECTED NOT DETECTED   CTX-M ESBL NOT DETECTED NOT DETECTED   Carbapenem resistance IMP NOT DETECTED NOT DETECTED   Carbapenem resistance KPC NOT DETECTED NOT DETECTED   Carbapenem resistance NDM NOT DETECTED NOT DETECTED   Carbapenem resist OXA 48 LIKE NOT DETECTED NOT DETECTED   Carbapenem resistance VIM NOT DETECTED NOT DETECTED    [name] D 07/23/2023  10:11 PM

## 2023-07-23 NOTE — ED Triage Notes (Signed)
 BIBA: Coming from home due to weakness. Thinks she is dehydrated because she got an enema last week.  She states dr in [facility] gave her fluid Monday afternoon and it went through her Monday night.  Says she has been getting weaker and weaker since Monday.  Aox4 127/61 94% 75 HR

## 2023-07-23 NOTE — ED Notes (Signed)
 Per the patient's request:  [name] [phone] and let her know patient is here and she is ok. White thanked me and asked for us  to update her later on.

## 2023-07-24 DIAGNOSIS — N1831 Chronic kidney disease, stage 3a: Secondary | ICD-10-CM | POA: Insufficient documentation

## 2023-07-24 DIAGNOSIS — N3000 Acute cystitis without hematuria: Secondary | ICD-10-CM | POA: Diagnosis not present

## 2023-07-24 DIAGNOSIS — F419 Anxiety disorder, unspecified: Secondary | ICD-10-CM | POA: Diagnosis not present

## 2023-07-24 DIAGNOSIS — R5383 Other fatigue: Secondary | ICD-10-CM

## 2023-07-24 DIAGNOSIS — R7881 Bacteremia: Secondary | ICD-10-CM

## 2023-07-24 DIAGNOSIS — B9620 Unspecified Escherichia coli [E. coli] as the cause of diseases classified elsewhere: Secondary | ICD-10-CM

## 2023-07-24 DIAGNOSIS — R531 Weakness: Principal | ICD-10-CM

## 2023-07-24 DIAGNOSIS — E876 Hypokalemia: Secondary | ICD-10-CM | POA: Diagnosis not present

## 2023-07-24 DIAGNOSIS — I10 Essential (primary) hypertension: Secondary | ICD-10-CM

## 2023-07-24 DIAGNOSIS — K5909 Other constipation: Secondary | ICD-10-CM

## 2023-07-24 DIAGNOSIS — D649 Anemia, unspecified: Secondary | ICD-10-CM | POA: Insufficient documentation

## 2023-07-24 LAB — BASIC METABOLIC PANEL WITH GFR
Anion gap: 7 (ref 5–15)
BUN: 16 mg/dL (ref 8–23)
CO2: 28 mmol/L (ref 22–32)
Calcium: 8.4 mg/dL — ABNORMAL LOW (ref 8.9–10.3)
Chloride: 102 mmol/L (ref 98–111)
Creatinine, Ser: 1.01 mg/dL — ABNORMAL HIGH (ref 0.44–1.00)
GFR, Estimated: 52 mL/min — ABNORMAL LOW (ref >60)
Glucose, Bld: 97 mg/dL (ref 70–99)
Potassium: 3.1 mmol/L — ABNORMAL LOW (ref 3.5–5.1)
Sodium: 137 mmol/L (ref 135–145)

## 2023-07-24 LAB — CBC
HCT: 26.8 % — ABNORMAL LOW (ref 36.0–46.0)
Hemoglobin: 9.6 g/dL — ABNORMAL LOW (ref 12.0–15.0)
MCH: 32.9 pg (ref 26.0–34.0)
MCHC: 35.8 g/dL (ref 30.0–36.0)
MCV: 91.8 fL (ref 80.0–100.0)
Platelets: 385 K/uL (ref 150–400)
RBC: 2.92 MIL/uL — ABNORMAL LOW (ref 3.87–5.11)
RDW: 13.9 % (ref 11.5–15.5)
WBC: 15.7 K/uL — ABNORMAL HIGH (ref 4.0–10.5)
nRBC: 0 % (ref 0.0–0.2)

## 2023-07-24 LAB — FERRITIN: Ferritin: 787 ng/mL — ABNORMAL HIGH (ref 11–307)

## 2023-07-24 MED ORDER — POTASSIUM CHLORIDE CRYS ER 20 MEQ PO TBCR
40.0000 meq | EXTENDED_RELEASE_TABLET | Freq: Once | ORAL | Status: AC
  Administered 2023-07-24: 40 meq via ORAL
  Filled 2023-07-24: qty 2

## 2023-07-24 MED ORDER — AMLODIPINE BESYLATE 5 MG PO TABS: 2.5000 mg | ORAL_TABLET | Freq: Every day | ORAL | Status: DC

## 2023-07-24 MED ORDER — DIAZEPAM 2 MG PO TABS
2.0000 mg | ORAL_TABLET | Freq: Two times a day (BID) | ORAL | Status: DC | PRN
  Administered 2023-07-26: 2 mg via ORAL
  Filled 2023-07-24: qty 1

## 2023-07-24 MED ORDER — SODIUM CHLORIDE 0.9 % IV SOLN
2.0000 g | INTRAVENOUS | Status: DC
  Administered 2023-07-24 – 2023-07-25 (×2): 2 g via INTRAVENOUS
  Filled 2023-07-24 (×3): qty 20

## 2023-07-24 NOTE — Assessment & Plan Note (Addendum)
 Continue propranolol .  As needed Valium .

## 2023-07-24 NOTE — Assessment & Plan Note (Signed)
 Hemoglobin 9.6.  Add on a ferritin to further classify anemia.

## 2023-07-24 NOTE — Plan of Care (Signed)

## 2023-07-24 NOTE — Evaluation (Signed)
 Physical Therapy Evaluation Patient Details Name: [name] MRN: [id number] DOB: 1926-05-02 Today's Date: 07/24/2023  History of Present Illness  Pt is a 89 y.o. female with medical history significant for anxiety, HTN, HLD and history of basal cell carcinoma who presented to ED complaining of generalized weakness.  Clinical Impression  Pt received in Semi-Fowler's position and agreeable to therapy.  Pt reports she lives alone and has a caregiver that comes around 1x/week to help her with whatever she needs.  She normally takes a sponge bath, and is able to do all other ADL's at home without any assistance.  Pt reports being inactive and is open to having therapy come to the house to get her up and moving more.    Pt ultimately performed transfers with good technique and without any physical assistance from the therapist.  Pt walks with good speed utilizing the walker, however becomes a little winded/SOB at the conclusion of ambulation attempt.  Pt's O2 saturations remained >90% throughout however.  Would like to attempt to have pt walk without AD since she sometimes does this at home.  Pt left in bed with all other needs met and meal tray in room.        If plan is discharge home, recommend the following: A little help with walking and/or transfers;A little help with bathing/dressing/bathroom;Assist for transportation;Help with stairs or ramp for entrance   Can travel by private vehicle        Equipment Recommendations None recommended by PT  Recommendations for Other Services       Functional Status Assessment Patient has had a recent decline in their functional status and demonstrates the ability to make significant improvements in function in a reasonable and predictable amount of time.     Precautions / Restrictions Restrictions Weight Bearing Restrictions Per Provider Order: No      Mobility  Bed Mobility Overal bed mobility: Modified Independent                   Transfers Overall transfer level: Needs assistance   Transfers: Sit to/from Stand Sit to Stand: Supervision           General transfer comment: Pt is bale to perform transfer with close supervision and use of the RW.    Ambulation/Gait Ambulation/Gait assistance: Contact guard assist Gait Distance (Feet): 180 Feet Assistive device: Rolling walker (2 wheels) Gait Pattern/deviations: WFL(Within Functional Limits) Gait velocity: adequate     General Gait Details: Pt with good speed ambulating with use of the RW, however becomes fatigued and winded at the conclusion of the loop around the nursing station.  Stairs            Wheelchair Mobility     Tilt Bed    Modified Rankin (Stroke Patients Only)       Balance                                             Pertinent Vitals/Pain Pain Assessment Pain Assessment: No/denies pain    Home Living Family/patient expects to be discharged to:: Private residence Living Arrangements: Alone Available Help at Discharge: Personal care attendant Type of Home: House Home Access: Ramped entrance       Home Layout: Two level;Able to live on main level with bedroom/bathroom Home Equipment: Grab bars - toilet;Rolling Walker (2 wheels);Rollator (4 wheels) Additional Comments:  Pt reports sometimes she walks with the walker at times, but doesn't use it all the time.    Prior Function Prior Level of Function : Independent/Modified Independent                     Extremity/Trunk Assessment   Upper Extremity Assessment Upper Extremity Assessment: Generalized weakness    Lower Extremity Assessment Lower Extremity Assessment: Generalized weakness       Communication   Communication Communication: No apparent difficulties    Cognition Arousal: Alert Behavior During Therapy: WFL for tasks assessed/performed   PT - Cognitive impairments: No apparent impairments                          Following commands: Intact       Cueing       General Comments      Exercises     Assessment/Plan    PT Assessment Patient needs continued PT services  PT Problem List Decreased strength;Decreased activity tolerance;Decreased balance;Decreased safety awareness       PT Treatment Interventions DME instruction;Gait training;Stair training;Functional mobility training;Therapeutic activities;Therapeutic exercise;Balance training;Neuromuscular re-education    PT Goals (Current goals can be found in the Care Plan section)  Acute Rehab PT Goals Patient Stated Goal: to return home and have therapy come out to help me get up and be more active. PT Goal Formulation: With patient Time For Goal Achievement: 08/07/23 Potential to Achieve Goals: Good    Frequency Min 2X/week     Co-evaluation               AM-PAC PT 6 Clicks Mobility  Outcome Measure Help needed turning from your back to your side while in a flat bed without using bedrails?: A Little Help needed moving from lying on your back to sitting on the side of a flat bed without using bedrails?: A Little Help needed moving to and from a bed to a chair (including a wheelchair)?: A Little Help needed standing up from a chair using your arms (e.g., wheelchair or bedside chair)?: A Little Help needed to walk in hospital room?: A Little Help needed climbing 3-5 steps with a railing? : A Little 6 Click Score: 18    End of Session Equipment Utilized During Treatment: Gait belt Activity Tolerance: Patient tolerated treatment well Patient left: in bed;with call bell/phone within reach Nurse Communication: Mobility status PT Visit Diagnosis: Unsteadiness on feet (R26.81);Muscle weakness (generalized) (M62.81);Difficulty in walking, not elsewhere classified (R26.2)    Time: 8670-8650 PT Time Calculation (min) (ACUTE ONLY): 20 min   Charges:   PT Evaluation $PT Eval Low Complexity: 1 Low PT  Treatments $Therapeutic Activity: 8-22 mins PT General Charges $$ ACUTE PT VISIT: 1 Visit         Fonda Simpers, PT, DPT Physical Therapist - [facility]  07/24/23, 3:14 PM

## 2023-07-24 NOTE — Hospital Course (Signed)
 89 y.o. female with medical history significant for anxiety, HTN, HLD and history of basal cell carcinoma who presented to ED complaining of generalized weakness.  Patient reported that she had been feeling increasingly weak over the last 7 days.  She denies any weakness to specific parts of the body.  She denies any fever or chills.  She denies any chest pain palpitations shortness of breath dysuria or flank pain.  She denies any hematemesis or melena.  She reports that she was at [facility] ED 4 days ago for constipation where she was given an enema and some fluid and was discharged home.  She lives alone and does not have anyone to come to her house for help.   ED Course: Upon arrival to the ED, patient is found to be in the ED patient was found to have positive urine for urinary tract infection, tachypnea and generalized weakness, hypokalemia and leukocytosis and hospitalist service was consulted for evaluation for admission for possible UTI.  7/19.  E. coli growing out of the blood culture.  Will switch Zosyn  over to Rocephin .  White blood cell count still high at 15.7. 7/20.  Patient states she needs to get out of the hospital.  Feeling better. 7/21.  Patient states she is leaving the hospital today.  Blood cultures sensitivities are pansensitive.  Will give a dose of Rocephin  prior to discharge discharge and switch over to Duricef for 4 more days.

## 2023-07-24 NOTE — Assessment & Plan Note (Signed)
 Replaced

## 2023-07-24 NOTE — Assessment & Plan Note (Signed)
 2 out of 4 blood cultures positive for E. coli so far.  Urine culture also positive for E. coli.  Sensitivities pending.  No resistance profile seen on BCID.  Switch Zosyn  over to Rocephin .

## 2023-07-24 NOTE — Progress Notes (Signed)
 Progress Note   Patient: [name] FMW:[id number] DOB: [birth year] DOA: 07/23/2023     1 DOS: the patient was seen and examined on 07/24/2023   Brief hospital course: 89 y.o. female with medical history significant for anxiety, HTN, HLD and history of basal cell carcinoma who presented to ED complaining of generalized weakness.  Patient reported that she had been feeling increasingly weak over the last 7 days.  She denies any weakness to specific parts of the body.  She denies any fever or chills.  She denies any chest pain palpitations shortness of breath dysuria or flank pain.  She denies any hematemesis or melena.  She reports that she was at [facility] ED 4 days ago for constipation where she was given an enema and some fluid and was discharged home.  She lives alone and does not have anyone to come to her house for help.   ED Course: Upon arrival to the ED, patient is found to be in the ED patient was found to have positive urine for urinary tract infection, tachypnea and generalized weakness, hypokalemia and leukocytosis and hospitalist service was consulted for evaluation for admission for possible UTI.  7/19.  E. coli growing out of the blood culture.  Will switch Zosyn  over to Rocephin .  White blood cell count still high at 15.7.  Assessment and Plan: * Bacteremia due to Escherichia coli 2 out of 4 blood cultures positive for E. coli so far.  Urine culture also positive for E. coli.  Sensitivities pending.  No resistance profile seen on BCID.  Switch Zosyn  over to Rocephin .  UTI (urinary tract infection) E. coli growing out of the urine  Hypokalemia Replace potassium  Anemia, unspecified Hemoglobin 9.6.  Add on a ferritin to further classify anemia.  CKD stage 3a, GFR 45-59 ml/min (HCC) Creatinine 1.01 with a GFR 52  Generalized weakness Physical therapy evaluation  Constipation Continue senna  Anxiety Continue propranolol .  As needed Valium .  Essential  hypertension Blood pressure normal but on the lower side will hold Norvasc  today and restart lower dose tomorrow.  Continue propranolol .        Subjective: Patient stated she felt well and asked to go home.  I explained to her that she has E. coli growing out of the blood culture and I agreed to keep her here a few days until I get the sensitivities back.  Physical Exam: Vitals:   07/23/23 1932 07/24/23 0359 07/24/23 0740 07/24/23 1105  BP: (!) 126/51 (!) 133/51 (!) 137/53 (!) 128/58  Pulse: 73 (!) 59 62 64  Resp: 18 16 16    Temp: 100.2 F (37.9 C) 98.3 F (36.8 C) 98.4 F (36.9 C)   TempSrc:  Oral    SpO2: 90% 92% 93%   Weight:      Height:       Physical Exam HENT:     Head: Normocephalic.  Eyes:     General: Lids are normal.  Cardiovascular:     Rate and Rhythm: Normal rate and regular rhythm.     Heart sounds: Normal heart sounds, S1 normal and S2 normal.  Pulmonary:     Breath sounds: No decreased breath sounds, wheezing, rhonchi or rales.  Abdominal:     Palpations: Abdomen is soft.     Tenderness: There is no abdominal tenderness.  Musculoskeletal:     Right lower leg: No swelling.     Left lower leg: No swelling.  Skin:    General: Skin is warm.  Findings: No rash.  Neurological:     Mental Status: She is alert.     Data Reviewed: Blood cultures positive for E. coli, urine culture positive for E. coli, creatinine 1.01 with a GFR of 52, potassium 3.1, white blood count 15.7, hemoglobin 9.6, platelet count 385  Family Communication: Left message for niece  Disposition: Status is: Inpatient Remains inpatient appropriate because: IV antibiotics for bacteremia  Planned Discharge Destination: Home with Home Health    Time spent: 28 minutes  Author: Charlie Patterson, MD 07/24/2023 1:29 PM  For on call review [email or web address].

## 2023-07-24 NOTE — Assessment & Plan Note (Signed)
 Blood pressure normal but on the lower side will hold Norvasc . Continue propranolol .

## 2023-07-24 NOTE — Assessment & Plan Note (Signed)
Physical therapy evaluation °

## 2023-07-24 NOTE — Assessment & Plan Note (Signed)
 E. coli growing out of the urine

## 2023-07-24 NOTE — Assessment & Plan Note (Signed)
 Today's creatinine 0.85 with a GFR greater than 60

## 2023-07-24 NOTE — Assessment & Plan Note (Signed)
 Continue senna.  Patient had bowel movement this morning

## 2023-07-25 DIAGNOSIS — D638 Anemia in other chronic diseases classified elsewhere: Secondary | ICD-10-CM | POA: Insufficient documentation

## 2023-07-25 DIAGNOSIS — N1831 Chronic kidney disease, stage 3a: Secondary | ICD-10-CM

## 2023-07-25 DIAGNOSIS — R531 Weakness: Secondary | ICD-10-CM | POA: Diagnosis not present

## 2023-07-25 DIAGNOSIS — N3000 Acute cystitis without hematuria: Secondary | ICD-10-CM | POA: Diagnosis not present

## 2023-07-25 DIAGNOSIS — R7881 Bacteremia: Secondary | ICD-10-CM | POA: Diagnosis not present

## 2023-07-25 DIAGNOSIS — E876 Hypokalemia: Secondary | ICD-10-CM | POA: Diagnosis not present

## 2023-07-25 LAB — CBC
HCT: 27.5 % — ABNORMAL LOW (ref 36.0–46.0)
Hemoglobin: 9.7 g/dL — ABNORMAL LOW (ref 12.0–15.0)
MCH: 32.7 pg (ref 26.0–34.0)
MCHC: 35.3 g/dL (ref 30.0–36.0)
MCV: 92.6 fL (ref 80.0–100.0)
Platelets: 457 K/uL — ABNORMAL HIGH (ref 150–400)
RBC: 2.97 MIL/uL — ABNORMAL LOW (ref 3.87–5.11)
RDW: 14.1 % (ref 11.5–15.5)
WBC: 13.8 K/uL — ABNORMAL HIGH (ref 4.0–10.5)
nRBC: 0 % (ref 0.0–0.2)

## 2023-07-25 LAB — BASIC METABOLIC PANEL WITH GFR
Anion gap: 11 (ref 5–15)
BUN: 12 mg/dL (ref 8–23)
CO2: 24 mmol/L (ref 22–32)
Calcium: 8.8 mg/dL — ABNORMAL LOW (ref 8.9–10.3)
Chloride: 101 mmol/L (ref 98–111)
Creatinine, Ser: 0.85 mg/dL (ref 0.44–1.00)
GFR, Estimated: >60 mL/min (ref >60)
Glucose, Bld: 122 mg/dL — ABNORMAL HIGH (ref 70–99)
Potassium: 3.6 mmol/L (ref 3.5–5.1)
Sodium: 136 mmol/L (ref 135–145)

## 2023-07-25 LAB — URINE CULTURE: Culture: >=100000 — AB

## 2023-07-25 MED ORDER — SENNOSIDES-DOCUSATE SODIUM 8.6-50 MG PO TABS
1.0000 | ORAL_TABLET | Freq: Two times a day (BID) | ORAL | Status: DC
  Administered 2023-07-25: 1 via ORAL
  Filled 2023-07-25: qty 1

## 2023-07-25 NOTE — Plan of Care (Signed)
  Problem: Health Behavior/Discharge Planning: Goal: Ability to manage health-related needs will improve Outcome: Progressing   Problem: Clinical Measurements: Goal: Cardiovascular complication will be avoided Outcome: Progressing   Problem: Activity: Goal: Risk for activity intolerance will decrease Outcome: Progressing   Problem: Nutrition: Goal: Adequate nutrition will be maintained Outcome: Progressing   Problem: Pain Managment: Goal: General experience of comfort will improve and/or be controlled Outcome: Progressing

## 2023-07-25 NOTE — Progress Notes (Signed)
 Progress Note   Patient: [name] FMW:[id number] DOB: 06-17-1926 DOA: 07/23/2023     2 DOS: the patient was seen and examined on 07/25/2023   Brief hospital course: 89 y.o. female with medical history significant for anxiety, HTN, HLD and history of basal cell carcinoma who presented to ED complaining of generalized weakness.  Patient reported that she had been feeling increasingly weak over the last 7 days.  She denies any weakness to specific parts of the body.  She denies any fever or chills.  She denies any chest pain palpitations shortness of breath dysuria or flank pain.  She denies any hematemesis or melena.  She reports that she was at [facility] ED 4 days ago for constipation where she was given an enema and some fluid and was discharged home.  She lives alone and does not have anyone to come to her house for help.   ED Course: Upon arrival to the ED, patient is found to be in the ED patient was found to have positive urine for urinary tract infection, tachypnea and generalized weakness, hypokalemia and leukocytosis and hospitalist service was consulted for evaluation for admission for possible UTI.  7/19.  E. coli growing out of the blood culture.  Will switch Zosyn  over to Rocephin .  White blood cell count still high at 15.7. 7/20.  Patient states she needs to get out of the hospital.  Feeling better.  Assessment and Plan: * Bacteremia due to Escherichia coli 2 out of 4 blood cultures positive for E. coli so far.  Urine culture also positive for E. coli.  Sensitivities on blood cultures still pending but urine culture resulted as pansensitive.  No resistance profile seen on BCID.  Continue Rocephin .  Likely home tomorrow on oral antibiotics.  UTI (urinary tract infection) E. coli growing out of the urine  Hypokalemia Replaced  Anemia of chronic disease With ferritin of 787 this goes along with anemia of chronic disease.  Last hemoglobin 9.7.  CKD stage 3a, GFR 45-59  ml/min (HCC) Today's creatinine 0.85 with a GFR greater than 60  Generalized weakness Walked well with physical therapy.  Recommending home health.  Constipation Continue senna  Anxiety Continue propranolol .  As needed Valium .  Essential hypertension Blood pressure normal but on the lower side will hold Norvasc . Continue propranolol .        Subjective: Patient states she feels better and needs to get out of the hospital.  I told her I need to keep her until we get the sensitivities back on the blood cultures.  She states he will stay only until tomorrow.  Admitted with urinary tract infection and found to be bacteremic  Physical Exam: Vitals:   07/24/23 2044 07/25/23 0346 07/25/23 0815 07/25/23 0817  BP: (!) 127/53 (!) 144/57  (!) 126/48  Pulse: 62 63  (!) 58  Resp: 16 16 16 16   Temp: 98.3 F (36.8 C) 98.7 F (37.1 C) 98.4 F (36.9 C) 98.6 F (37 C)  TempSrc: Oral  Oral Oral  SpO2: 96% 93%  95%  Weight:      Height:       Physical Exam HENT:     Head: Normocephalic.  Eyes:     General: Lids are normal.  Cardiovascular:     Rate and Rhythm: Normal rate and regular rhythm.     Heart sounds: Normal heart sounds, S1 normal and S2 normal.  Pulmonary:     Breath sounds: No decreased breath sounds, wheezing, rhonchi or rales.  Abdominal:     Palpations: Abdomen is soft.     Tenderness: There is no abdominal tenderness.  Musculoskeletal:     Right lower leg: No swelling.     Left lower leg: No swelling.  Skin:    General: Skin is warm.     Findings: No rash.  Neurological:     Mental Status: She is alert.     Data Reviewed: Creatinine 0.85, potassium 3.6, white blood cell count 13.8, hemoglobin 9.7, platelet count 457  Family Communication: Left message for niece  Disposition: Status is: Inpatient Remains inpatient appropriate because: Continue IV antibiotics for bacteremia  Planned Discharge Destination: Home    Time spent: 28  minutes  Author: Charlie Patterson, MD 07/25/2023 2:20 PM  For on call review [email or web address].

## 2023-07-25 NOTE — Assessment & Plan Note (Signed)
 With ferritin of 787 this goes along with anemia of chronic disease.  Last hemoglobin 9.7.

## 2023-07-26 ENCOUNTER — Telehealth: Payer: Self-pay | Admitting: Internal Medicine

## 2023-07-26 DIAGNOSIS — N3000 Acute cystitis without hematuria: Secondary | ICD-10-CM | POA: Diagnosis not present

## 2023-07-26 DIAGNOSIS — R7881 Bacteremia: Secondary | ICD-10-CM | POA: Diagnosis not present

## 2023-07-26 DIAGNOSIS — R531 Weakness: Secondary | ICD-10-CM | POA: Diagnosis not present

## 2023-07-26 DIAGNOSIS — E876 Hypokalemia: Secondary | ICD-10-CM | POA: Diagnosis not present

## 2023-07-26 LAB — CULTURE, BLOOD (ROUTINE X 2)

## 2023-07-26 MED ORDER — SODIUM CHLORIDE 0.9 % IV SOLN
2.0000 g | Freq: Once | INTRAVENOUS | Status: AC
  Administered 2023-07-26: 2 g via INTRAVENOUS
  Filled 2023-07-26: qty 20

## 2023-07-26 MED ORDER — POLYVINYL ALCOHOL 1.4 % OP SOLN
1.0000 [drp] | Freq: Two times a day (BID) | OPHTHALMIC | Status: DC | PRN
  Filled 2023-07-26: qty 15

## 2023-07-26 MED ORDER — CEFADROXIL 500 MG PO CAPS
500.0000 mg | ORAL_CAPSULE | Freq: Two times a day (BID) | ORAL | 0 refills | Status: AC
Start: 2023-07-27 — End: 2023-07-31

## 2023-07-26 MED ORDER — POLYETHYL GLYCOL-PROPYL GLYCOL 0.4-0.3 % OP SOLN
1.0000 [drp] | Freq: Two times a day (BID) | OPHTHALMIC | Status: DC | PRN
  Administered 2023-07-26: 1 [drp] via OPHTHALMIC
  Filled 2023-07-26: qty 1

## 2023-07-26 NOTE — Telephone Encounter (Signed)
 noted

## 2023-07-26 NOTE — Telephone Encounter (Signed)
 Copied from CRM [id number]. Topic: Appointments - Scheduling Inquiry for Clinic >> Jul 26, 2023  9:43 AM Drema MATSU wrote: Reason for CRM: Patient is needing a Hospital follow up. Dr. Marylynn next available is in September. Patient will be discharged today from [facility].

## 2023-07-26 NOTE — Discharge Summary (Signed)
 Physician Discharge Summary   Patient: [name] MRN: [id number] DOB: 05-17-1926  Admit date:     07/23/2023  Discharge date: 07/26/23  Discharge Physician: Charlie Patterson   PCP: Marylynn Verneita CROME, MD   Recommendations at discharge:   Follow-up PCP 5 days  Discharge Diagnoses: Principal Problem:   Bacteremia due to Escherichia coli Active Problems:   UTI (urinary tract infection)   Hypokalemia   Essential hypertension   Anxiety   Constipation   Generalized weakness   CKD stage 3a, GFR 45-59 ml/min (HCC)   Anemia of chronic disease    Hospital Course: 89 y.o. female with medical history significant for anxiety, HTN, HLD and history of basal cell carcinoma who presented to ED complaining of generalized weakness.  Patient reported that she had been feeling increasingly weak over the last 7 days.  She denies any weakness to specific parts of the body.  She denies any fever or chills.  She denies any chest pain palpitations shortness of breath dysuria or flank pain.  She denies any hematemesis or melena.  She reports that she was at [facility] ED 4 days ago for constipation where she was given an enema and some fluid and was discharged home.  She lives alone and does not have anyone to come to her house for help.   ED Course: Upon arrival to the ED, patient is found to be in the ED patient was found to have positive urine for urinary tract infection, tachypnea and generalized weakness, hypokalemia and leukocytosis and hospitalist service was consulted for evaluation for admission for possible UTI.  7/19.  E. coli growing out of the blood culture.  Will switch Zosyn  over to Rocephin .  White blood cell count still high at 15.7. 7/20.  Patient states she needs to get out of the hospital.  Feeling better. 7/21.  Patient states she is leaving the hospital today.  Blood cultures sensitivities are pansensitive.  Will give a dose of Rocephin  prior to discharge discharge and switch over to  Duricef for 4 more days.  Assessment and Plan: * Bacteremia due to Escherichia coli 2 out of 4 blood cultures positive for E. coli so far.  Urine culture also positive for E. coli.  Sensitivities on blood cultures and urine culture pansensitive.  Patient initially was on Zosyn  but switched over to Rocephin .  Patient given a dose of Rocephin  prior to discharge on 7/21.  Will discharge home on Duricef for 4 more days.  UTI (urinary tract infection) E. coli growing out of the urine  Hypokalemia Replaced  Anemia of chronic disease With ferritin of 787 this goes along with anemia of chronic disease.  Last hemoglobin 9.7.  CKD stage 3a, GFR 45-59 ml/min (HCC) Today's creatinine 0.85 with a GFR greater than 60  Generalized weakness Walked well with physical therapy.  Recommending home health.  Constipation Continue senna.  Patient had bowel movement this morning  Anxiety Continue propranolol .  As needed Valium .  Essential hypertension Blood pressure normal but on the lower side will hold Norvasc . Continue propranolol .   Patient is 89 years old and lives alone.  Follow-up with PCP closely.      Consultants: None Procedures performed: None Disposition: Home health Diet recommendation:  Cardiac diet DISCHARGE MEDICATION: Allergies as of 07/26/2023       Reactions   Atorvastatin     Other reaction(s): Other (See Comments) Myopathy, transaminitis   Flomax  [tamsulosin ] Other (See Comments)   Syncope  Medication List     STOP taking these medications    amLODipine  10 MG tablet Commonly known as: NORVASC        TAKE these medications    cefadroxil  500 MG capsule Commonly known as: DURICEF Take 1 capsule (500 mg total) by mouth 2 (two) times daily for 4 days. Start taking on: July 27, 2023   diazepam  5 MG tablet Commonly known as: VALIUM  TAKE 1 TABLET DAILY AS NEEDED FOR ANXIETY OR SPASM   FIBER PO Take 1 capsule by mouth in the morning, at noon, and at  bedtime.   Lutein 20 MG Caps Take 20 mg by mouth daily.   propranolol  ER 60 MG 24 hr capsule Commonly known as: INDERAL  LA TAKE 1 CAPSULE BY MOUTH EVERY DAY   sennosides-docusate sodium  8.6-50 MG tablet Commonly known as: SENOKOT-S Take 1 tablet by mouth daily as needed for constipation.   SYSTANE BALANCE OP Place 1 drop into both eyes 4 (four) times daily as needed (dry eyes).   Vitamin E 400 units Tabs Take 1 tablet by mouth daily.        Discharge Exam: Filed Weights   07/23/23 0624  Weight: 57 kg   Physical Exam HENT:     Head: Normocephalic.  Eyes:     General: Lids are normal.  Cardiovascular:     Rate and Rhythm: Normal rate and regular rhythm.     Heart sounds: Normal heart sounds, S1 normal and S2 normal.  Pulmonary:     Breath sounds: No decreased breath sounds, wheezing, rhonchi or rales.  Abdominal:     Palpations: Abdomen is soft.     Tenderness: There is no abdominal tenderness.  Musculoskeletal:     Right lower leg: No swelling.     Left lower leg: No swelling.  Skin:    General: Skin is warm.     Findings: No rash.  Neurological:     Mental Status: She is alert.      Condition at discharge: fair  The results of significant diagnostics from this hospitalization (including imaging, microbiology, ancillary and laboratory) are listed below for reference.   Imaging Studies: DG Chest 2 View Result Date: 07/23/2023 CLINICAL DATA:  Weakness. EXAM: CHEST - 2 VIEW COMPARISON:  October 09, 2017. FINDINGS: The heart size and mediastinal contours are within normal limits. Mild bibasilar subsegmental atelectasis or scarring is noted. The visualized skeletal structures are unremarkable. IMPRESSION: Mild bibasilar subsegmental atelectasis or scarring. Electronically Signed   By: Lynwood Landy Raddle M.D.   On: 07/23/2023 08:29    Microbiology: Results for orders placed or performed during the hospital encounter of 07/23/23  Culture, blood (routine x 2)      Status: Abnormal   Collection Time: 07/23/23  8:26 AM   Specimen: BLOOD LEFT ARM  Result Value Ref Range Status   Specimen Description   Final    BLOOD LEFT ARM Performed at [facility], [address], [place], [place] [address]    Special Requests   Final    BOTTLES DRAWN AEROBIC AND ANAEROBIC Blood Culture results may not be optimal due to an inadequate volume of blood received in culture bottles Performed at [facility], [address] [place], [place] [address]    Culture  Setup Time   Final    GRAM NEGATIVE RODS AEROBIC BOTTLE ONLY CRITICAL RESULT CALLED TO, READ BACK BY AND VERIFIED WITH: JASON ROBBINS @ 07/23/23 2205 AB Performed at [facility],  [address] [place], [place] [address]    Culture ESCHERICHIA COLI (A)  Final   Report Status 07/26/2023 FINAL  Final   Organism ID, Bacteria ESCHERICHIA COLI  Final   Organism ID, Bacteria ESCHERICHIA COLI  Final      Susceptibility   Escherichia coli - KIRBY BAUER*    CEFAZOLIN  SENSITIVE Sensitive    Escherichia coli - MIC*    AMPICILLIN <=2 SENSITIVE Sensitive     CEFEPIME <=0.12 SENSITIVE Sensitive     CEFTAZIDIME <=1 SENSITIVE Sensitive     CEFTRIAXONE  <=0.25 SENSITIVE Sensitive     CIPROFLOXACIN  <=0.25 SENSITIVE Sensitive     GENTAMICIN <=1 SENSITIVE Sensitive     IMIPENEM <=0.25 SENSITIVE Sensitive     TRIMETH /SULFA  <=20 SENSITIVE Sensitive     AMPICILLIN/SULBACTAM <=2 SENSITIVE Sensitive     PIP/TAZO <=4 SENSITIVE Sensitive ug/mL    * ESCHERICHIA COLI    ESCHERICHIA COLI  Culture, blood (routine x 2)     Status: Abnormal   Collection Time: 07/23/23  8:26 AM   Specimen: BLOOD RIGHT ARM  Result Value Ref Range Status   Specimen Description   Final    BLOOD RIGHT ARM Performed at [facility], [address], [place], [place] [address]    Special Requests   Final    BOTTLES DRAWN AEROBIC AND ANAEROBIC Blood Culture results may not be optimal due to an inadequate volume of blood  received in culture bottles Performed at [facility], [address] [place], [place] [address]    Culture  Setup Time   Final    GRAM NEGATIVE RODS ANAEROBIC BOTTLE ONLY CRITICAL VALUE NOTED.  VALUE IS CONSISTENT WITH PREVIOUSLY REPORTED AND CALLED VALUE. Performed at [facility], [address] [place], [place] [address]    Culture (A)  Final    ESCHERICHIA COLI SUSCEPTIBILITIES PERFORMED ON PREVIOUS CULTURE WITHIN THE LAST 5 DAYS. Performed at [facility], [address], [place], [place] [address]    Report Status 07/26/2023 FINAL  Final  Blood Culture ID Panel (Reflexed)     Status: Abnormal   Collection Time: 07/23/23  8:26 AM  Result Value Ref Range Status   Enterococcus faecalis NOT DETECTED NOT DETECTED Final   Enterococcus Faecium NOT DETECTED NOT DETECTED Final   Listeria monocytogenes NOT DETECTED NOT DETECTED Final   Staphylococcus species NOT DETECTED NOT DETECTED Final   Staphylococcus aureus (BCID) NOT DETECTED NOT DETECTED Final   Staphylococcus epidermidis NOT DETECTED NOT DETECTED Final   Staphylococcus lugdunensis NOT DETECTED NOT DETECTED Final   Streptococcus species NOT DETECTED NOT DETECTED Final   Streptococcus agalactiae NOT DETECTED NOT DETECTED Final   Streptococcus pneumoniae NOT DETECTED NOT DETECTED Final   Streptococcus pyogenes NOT DETECTED NOT DETECTED Final   A.calcoaceticus-baumannii NOT DETECTED NOT DETECTED Final   Bacteroides fragilis NOT DETECTED NOT DETECTED Final   Enterobacterales DETECTED (A) NOT DETECTED Final    Comment: Enterobacterales represent a large order of gram negative bacteria, not a single organism. CRITICAL RESULT CALLED TO, READ BACK BY AND VERIFIED WITH: JASON ROBBINS @ 07/23/23 2205 AB    Enterobacter cloacae complex NOT DETECTED NOT DETECTED Final   Escherichia coli DETECTED (A) NOT DETECTED Final    Comment: CRITICAL RESULT CALLED TO, READ BACK BY AND VERIFIED WITH: JASON ROBBINS @  07/23/23 2205 AB    Klebsiella aerogenes NOT DETECTED NOT DETECTED Final   Klebsiella oxytoca NOT DETECTED NOT DETECTED Final   Klebsiella pneumoniae  NOT DETECTED NOT DETECTED Final   Proteus species NOT DETECTED NOT DETECTED Final   Salmonella species NOT DETECTED NOT DETECTED Final   Serratia marcescens NOT DETECTED NOT DETECTED Final   Haemophilus influenzae NOT DETECTED NOT DETECTED Final   Neisseria meningitidis NOT DETECTED NOT DETECTED Final   Pseudomonas aeruginosa NOT DETECTED NOT DETECTED Final   Stenotrophomonas maltophilia NOT DETECTED NOT DETECTED Final   Candida albicans NOT DETECTED NOT DETECTED Final   Candida auris NOT DETECTED NOT DETECTED Final   Candida glabrata NOT DETECTED NOT DETECTED Final   Candida krusei NOT DETECTED NOT DETECTED Final   Candida parapsilosis NOT DETECTED NOT DETECTED Final   Candida tropicalis NOT DETECTED NOT DETECTED Final   Cryptococcus neoformans/gattii NOT DETECTED NOT DETECTED Final   CTX-M ESBL NOT DETECTED NOT DETECTED Final   Carbapenem resistance IMP NOT DETECTED NOT DETECTED Final   Carbapenem resistance KPC NOT DETECTED NOT DETECTED Final   Carbapenem resistance NDM NOT DETECTED NOT DETECTED Final   Carbapenem resist OXA 48 LIKE NOT DETECTED NOT DETECTED Final   Carbapenem resistance VIM NOT DETECTED NOT DETECTED Final    Comment: Performed at [facility], [address] [place], [place] [address]  Urine Culture     Status: Abnormal   Collection Time: 07/23/23 11:29 AM   Specimen: Urine, Clean Catch  Result Value Ref Range Status   Specimen Description   Final    URINE, CLEAN CATCH Performed at [facility], [address] [place], [place] [address]    Special Requests   Final    NONE Performed at [facility], [address] [place], [place] [address]    Culture >=100,000 COLONIES/mL ESCHERICHIA COLI (A)  Final   Report Status 07/25/2023 FINAL  Final   Organism ID, Bacteria ESCHERICHIA  COLI (A)  Final      Susceptibility   Escherichia coli - MIC*    AMPICILLIN <=2 SENSITIVE Sensitive     CEFAZOLIN  <=4 SENSITIVE Sensitive     CEFEPIME <=0.12 SENSITIVE Sensitive     CEFTRIAXONE  <=0.25 SENSITIVE Sensitive     CIPROFLOXACIN  <=0.25 SENSITIVE Sensitive     GENTAMICIN <=1 SENSITIVE Sensitive     IMIPENEM <=0.25 SENSITIVE Sensitive     NITROFURANTOIN  <=16 SENSITIVE Sensitive     TRIMETH /SULFA  <=20 SENSITIVE Sensitive     AMPICILLIN/SULBACTAM <=2 SENSITIVE Sensitive     PIP/TAZO <=4 SENSITIVE Sensitive ug/mL    * >=100,000 COLONIES/mL ESCHERICHIA COLI    Labs: CBC: Recent Labs  Lab 07/23/23 0637 07/24/23 0556 07/25/23 0741  WBC 19.5* 15.7* 13.8*  NEUTROABS 16.3*  --   --   HGB 9.8* 9.6* 9.7*  HCT 27.5* 26.8* 27.5*  MCV 93.2 91.8 92.6  PLT 364 385 457*   Basic Metabolic Panel: Recent Labs  Lab 07/23/23 0637 07/24/23 0556 07/25/23 0741  NA 134* 137 136  K 3.3* 3.1* 3.6  CL 102 102 101  CO2 24 28 24   GLUCOSE 108* 97 122*  BUN 20 16 12   CREATININE 1.00 1.01* 0.85  CALCIUM  8.8* 8.4* 8.8*   Liver Function Tests: Recent Labs  Lab 07/23/23 0825  AST 16  ALT 10  ALKPHOS 52  BILITOT 1.1  PROT 6.3*  ALBUMIN 2.6*   CBG: No results for input(s): GLUCAP in the last 168 hours.  Discharge time spent: greater than 30 minutes.  Signed: Charlie Patterson, MD [facility] 07/26/2023

## 2023-07-26 NOTE — Plan of Care (Signed)

## 2023-07-26 NOTE — Telephone Encounter (Signed)
 Please scheduled pt for a hospital follow up. Pt can see Dr. Narendra next week for this.

## 2023-07-27 ENCOUNTER — Telehealth: Payer: Self-pay

## 2023-07-27 DIAGNOSIS — H3411 Central retinal artery occlusion, right eye: Secondary | ICD-10-CM | POA: Diagnosis not present

## 2023-07-27 DIAGNOSIS — H353221 Exudative age-related macular degeneration, left eye, with active choroidal neovascularization: Secondary | ICD-10-CM | POA: Diagnosis not present

## 2023-07-27 DIAGNOSIS — H04122 Dry eye syndrome of left lacrimal gland: Secondary | ICD-10-CM | POA: Diagnosis not present

## 2023-07-27 NOTE — Transitions of Care (Post Inpatient/ED Visit) (Signed)
   07/27/2023  Name: RENEE ERB MRN: [id number] DOB: 1926/12/22  Today's TOC FU Call Status: Today's TOC FU Call Status:: Unsuccessful Call (1st Attempt) Unsuccessful Call (1st Attempt) Date: 07/27/23  Attempted to reach the patient regarding the most recent Inpatient/ED visit.  Follow Up Plan: Additional outreach attempts will be made to reach the patient to complete the Transitions of Care (Post Inpatient/ED visit) call.   Arvin Seip RN, BSN, CCM [employer], Population Health Case Manager Phone: [phone]

## 2023-07-28 ENCOUNTER — Telehealth: Payer: Self-pay

## 2023-07-28 NOTE — Transitions of Care (Post Inpatient/ED Visit) (Signed)
   07/28/2023  Name: [name] MRN: [id number] DOB: 02-02-1926  Today's TOC FU Call Status: Today's TOC FU Call Status:: Unsuccessful Call (2nd Attempt) Unsuccessful Call (2nd Attempt) Date: 07/28/23  Attempted to reach the patient regarding the most recent Inpatient/ED visit.  Follow Up Plan: Additional outreach attempts will be made to reach the patient to complete the Transitions of Care (Post Inpatient/ED visit) call.   Shona Prow RN, CCM [place]  VBCI-Population Health RN Care Manager [phone]

## 2023-07-29 ENCOUNTER — Other Ambulatory Visit: Payer: Self-pay

## 2023-07-29 ENCOUNTER — Telehealth: Payer: Self-pay

## 2023-07-29 ENCOUNTER — Ambulatory Visit: Payer: Self-pay

## 2023-07-29 ENCOUNTER — Emergency Department
Admission: EM | Admit: 2023-07-29 | Discharge: 2023-07-29 | Discharge status: Against Medical Advice | Attending: Emergency Medicine | Admitting: Emergency Medicine

## 2023-07-29 DIAGNOSIS — R5383 Other fatigue: Secondary | ICD-10-CM | POA: Insufficient documentation

## 2023-07-29 DIAGNOSIS — Z85828 Personal history of other malignant neoplasm of skin: Secondary | ICD-10-CM | POA: Insufficient documentation

## 2023-07-29 DIAGNOSIS — R197 Diarrhea, unspecified: Secondary | ICD-10-CM | POA: Insufficient documentation

## 2023-07-29 DIAGNOSIS — Z5329 Procedure and treatment not carried out because of patient's decision for other reasons: Secondary | ICD-10-CM | POA: Diagnosis not present

## 2023-07-29 DIAGNOSIS — I10 Essential (primary) hypertension: Secondary | ICD-10-CM | POA: Diagnosis not present

## 2023-07-29 LAB — URINALYSIS, ROUTINE W REFLEX MICROSCOPIC
Bacteria, UA: NONE SEEN
Bilirubin Urine: NEGATIVE
Glucose, UA: NEGATIVE mg/dL
Hgb urine dipstick: NEGATIVE
Ketones, ur: NEGATIVE mg/dL
Nitrite: NEGATIVE
Protein, ur: NEGATIVE mg/dL
Specific Gravity, Urine: 1.009 (ref 1.005–1.030)
pH: 6 (ref 5.0–8.0)

## 2023-07-29 LAB — CBC
HCT: 30.9 % — ABNORMAL LOW (ref 36.0–46.0)
Hemoglobin: 10.1 g/dL — ABNORMAL LOW (ref 12.0–15.0)
MCH: 32.4 pg (ref 26.0–34.0)
MCHC: 32.7 g/dL (ref 30.0–36.0)
MCV: 99 fL (ref 80.0–100.0)
Platelets: 514 K/uL — ABNORMAL HIGH (ref 150–400)
RBC: 3.12 MIL/uL — ABNORMAL LOW (ref 3.87–5.11)
RDW: 14.7 % (ref 11.5–15.5)
WBC: 13.7 K/uL — ABNORMAL HIGH (ref 4.0–10.5)
nRBC: 0 % (ref 0.0–0.2)

## 2023-07-29 LAB — COMPREHENSIVE METABOLIC PANEL WITH GFR
ALT: 22 U/L (ref 0–44)
AST: 26 U/L (ref 15–41)
Albumin: 3 g/dL — ABNORMAL LOW (ref 3.5–5.0)
Alkaline Phosphatase: 42 U/L (ref 38–126)
Anion gap: 9 (ref 5–15)
BUN: 11 mg/dL (ref 8–23)
CO2: 26 mmol/L (ref 22–32)
Calcium: 9 mg/dL (ref 8.9–10.3)
Chloride: 104 mmol/L (ref 98–111)
Creatinine, Ser: 0.96 mg/dL (ref 0.44–1.00)
GFR, Estimated: 55 mL/min — ABNORMAL LOW (ref >60)
Glucose, Bld: 107 mg/dL — ABNORMAL HIGH (ref 70–99)
Potassium: 3.5 mmol/L (ref 3.5–5.1)
Sodium: 139 mmol/L (ref 135–145)
Total Bilirubin: 0.8 mg/dL (ref 0.0–1.2)
Total Protein: 6.6 g/dL (ref 6.5–8.1)

## 2023-07-29 LAB — C DIFFICILE QUICK SCREEN W PCR REFLEX
C Diff antigen: NEGATIVE
C Diff toxin: NEGATIVE

## 2023-07-29 LAB — TROPONIN I (HIGH SENSITIVITY)
Troponin I (High Sensitivity): 9 ng/L (ref <18)
Troponin I (High Sensitivity): 9 ng/L (ref <18)

## 2023-07-29 NOTE — ED Provider Notes (Addendum)
 [facility] Provider Note    Event Date/Time   First MD Initiated Contact with Patient 07/29/23 1212     (approximate)   History   Chief Complaint: Weakness   HPI  [name] is a 89 y.o. female with a history of hypertension hyperlipidemia who comes ED complaining of fatigue today.  No pain or fever.  Also reports diarrhea this morning starting at 2 AM, 4 episodes of loose dark stool throughout the morning.  Outside records reviewed noting that patient was hospitalized about a week ago, found to have a UTI with E. coli bacteremia, pansensitive on culture.  Was discharged from the hospital 3 days ago, continued oral cephalosporin since then, and the course is scheduled to be complete tomorrow.        Past Medical History:  Diagnosis Date   Anxiety    Arthritis    may have a touch; all over I guess (10/11/2017)   Back pain    all my back (10/11/2017)   Basal cell carcinoma    have had many taken off; burned (10/11/2017)   Chronic orthostatic hypotension 06/2010   with pprior syncopal event,  did not tolelrate florinef trial   Complication of anesthesia    2019 ws very confused for several days after anesthesia   Constipation 08/12/2013   Depression    Hyperlipidemia    Hypertension    Scalp laceration 08/26/2022   Tibia/fibula fracture 10/09/2017   Urinary frequency     Current Outpatient Rx   Order #: [id number] Class: Normal   Order #: [id number] Class: Normal   Order #: [id number] Class: Historical Med   Order #: [id number] Class: Historical Med   Order #: [id number] Class: Normal   Order #: [id number] Class: Historical Med   Order #: [id number] Class: Historical Med   Order #: [id number] Class: Historical Med    Past Surgical History:  Procedure Laterality Date   ANKLE FRACTURE SURGERY Left 1998   CATARACT EXTRACTION W/ INTRAOCULAR LENS  IMPLANT, BILATERAL Bilateral    DILATION AND CURETTAGE OF UTERUS     FRACTURE SURGERY     IM  NAILING TIBIA Left 10/10/2017   LAPAROSCOPIC BILATERAL SALPINGO OOPHERECTOMY Bilateral 09/02/2020   Procedure: LAPAROSCOPIC BILATERAL SALPINGO OOPHORECTOMY;  Surgeon: Schermerhorn, Debby PARAS, MD;  Location: [facility] ORS;  Service: Gynecology;  Laterality: Bilateral;   TIBIA IM NAIL INSERTION Left 10/10/2017   Procedure: INTRAMEDULLARY (IM) NAIL TIBIAL;  Surgeon: Jerri Kay HERO, MD;  Location: [facility] OR;  Service: Orthopedics;  Laterality: Left;   WRIST FRACTURE SURGERY Right 2005    Physical Exam   Triage Vital Signs: ED Triage Vitals  Encounter Vitals Group     BP 07/29/23 0943 (!) 125/58     Girls Systolic BP Percentile --      Girls Diastolic BP Percentile --      Boys Systolic BP Percentile --      Boys Diastolic BP Percentile --      Pulse Rate 07/29/23 0943 63     Resp 07/29/23 0943 18     Temp 07/29/23 0943 98.6 F (37 C)     Temp Source 07/29/23 0943 Oral     SpO2 07/29/23 0943 98 %     Weight 07/29/23 0944 125 lb (56.7 kg)     Height 07/29/23 0944 5' 6 (1.676 m)     Head Circumference --      Peak Flow --      Pain Score 07/29/23 0944 0     Pain  Loc --      Pain Education --      Exclude from Growth Chart --     Most recent vital signs: Vitals:   07/29/23 0943  BP: (!) 125/58  Pulse: 63  Resp: 18  Temp: 98.6 F (37 C)  SpO2: 98%    General: Awake, no distress.  CV:  Good peripheral perfusion.  Regular rate rhythm Resp:  Normal effort.  Clear to auscultation bilaterally Abd:  No distention.  Soft nontender Other:  Moist oral mucosa Soiled depends briefs brought in by patient, dark stool Hemoccult negative.   ED Results / Procedures / Treatments   Labs (all labs ordered are listed, but only abnormal results are displayed) Labs Reviewed  CBC - Abnormal; Notable for the following components:      Result Value   WBC 13.7 (*)    RBC 3.12 (*)    Hemoglobin 10.1 (*)    HCT 30.9 (*)    Platelets 514 (*)    All other components within normal limits   COMPREHENSIVE METABOLIC PANEL WITH GFR - Abnormal; Notable for the following components:   Glucose, Bld 107 (*)    Albumin 3.0 (*)    GFR, Estimated 55 (*)    All other components within normal limits  C DIFFICILE QUICK SCREEN W PCR REFLEX    URINALYSIS, ROUTINE W REFLEX MICROSCOPIC  TROPONIN I (HIGH SENSITIVITY)  TROPONIN I (HIGH SENSITIVITY)     EKG Interpreted by me Normal sinus rhythm rate of 62.  Normal axis intervals QRS ST segments T waves   RADIOLOGY    PROCEDURES:  Procedures   MEDICATIONS ORDERED IN ED: Medications - No data to display   IMPRESSION / MDM / ASSESSMENT AND PLAN / ED COURSE  I reviewed the triage vital signs and the nursing notes.  DDx: Dehydration, AKI, anemia, antibiotic side effect, C. difficile colitis  Patient's presentation is most consistent with acute presentation with potential threat to life or bodily function.  Patient presents with fatigue, most likely due to diarrhea as a benign medication side effect.  No fever, abdomen is benign.  Will check labs to confirm.  If workup is reassuring I think she is stable for discharge home.   ----------------------------------------- 2:37 PM on 07/29/2023 ----------------------------------------- Serum labs unremarkable.  Stool C. difficile test pending in lab.  Patient not willing to wait any longer, feels okay for outpatient management, stable for discharge.  Will need to follow-up with her doctor regarding results of the C. difficile test in case she needs additional antibiotics.      FINAL CLINICAL IMPRESSION(S) / ED DIAGNOSES   Final diagnoses:  Fatigue, unspecified type  Diarrhea, unspecified type     Rx / DC Orders   ED Discharge Orders     None        Note:  This document was prepared using Dragon voice recognition software and may include unintentional dictation errors.   Viviann Pastor, MD 07/29/23 1248    Viviann Pastor, MD 07/29/23 [phone]

## 2023-07-29 NOTE — Telephone Encounter (Signed)
Pt is at the ED. °

## 2023-07-29 NOTE — ED Notes (Signed)
 Per Dr. Viviann use patient home stool sample

## 2023-07-29 NOTE — Telephone Encounter (Signed)
 FYI Only or Action Required?: FYI only for provider.  Patient was last seen in primary care on 04/21/2023 by Marylynn Verneita CROME, MD.  Called Nurse Triage reporting Diarrhea.  Symptoms began today.  Interventions attempted: Nothing.  Symptoms are: gradually worsening.  Triage Disposition: Go to ED Now (Notify PCP)  Patient/caregiver understands and will follow disposition?: Yes       Copied from CRM #[id number]. Topic: Clinical - Red Word Triage >> Jul 29, 2023  7:33 AM Deaijah H wrote: Red Word that prompted transfer to Nurse Triage: Diarrhea from medication Reason for Disposition  Black or tarry bowel movements  (Exception: Chronic-unchanged black-grey BMs AND is taking iron pills or Pepto-Bismol.)  Answer Assessment - Initial Assessment Questions 1. DIARRHEA SEVERITY: How bad is the diarrhea? How many more stools have you had in the past 24 hours than normal?      4 since this am  2. ONSET: When did the diarrhea begin?      Off and on since Monday  3. STOOL DESCRIPTION:  How loose or watery is the diarrhea? What is the stool color? Is there any blood or mucous in the stool?     loose 4. VOMITING: Are you also vomiting? If Yes, ask: How many times in the past 24 hours?      no 5. ABDOMEN PAIN: Are you having any abdomen pain? If Yes, ask: What does it feel like? (e.g., crampy, dull, intermittent, constant)      no 6. ABDOMEN PAIN SEVERITY: If present, ask: How bad is the pain?  (e.g., Scale 1-10; mild, moderate, or severe)     no 7. ORAL INTAKE: If vomiting, Have you been able to drink liquids? How much liquids have you had in the past 24 hours?     *No Answer* 8. HYDRATION: Any signs of dehydration? (e.g., dry mouth [not just dry lips], too weak to stand, dizziness, new weight loss) When did you last urinate?     Dry mouth  10. ANTIBIOTIC USE: Are you taking antibiotics now or have you taken antibiotics in the past 2 months?       Thinks is  caused by cefadroxil  500 mg - since Monday  11. OTHER SYMPTOMS: Do you have any other symptoms? (e.g., fever, blood in stool)       Black color stool  12. PREGNANCY: Is there any chance you are pregnant? When was your last menstrual period?       N/a  Protocols used: [facility]

## 2023-07-29 NOTE — ED Triage Notes (Addendum)
 Pt in with co weakness and dizziness for a few days, was here Friday for the same thing. Denies any vomiting, denies loc. Pt was on blood thinners but was told to discontinue them when she was here Friday. PT was dx with UTI.

## 2023-07-29 NOTE — ED Notes (Signed)
 Pt given hospital tray

## 2023-07-29 NOTE — Transitions of Care (Post Inpatient/ED Visit) (Signed)
   07/29/2023  Name: RAYNA BRENNER MRN: [id number] DOB: Nov 09, 1926  Today's TOC FU Call Status: Today's TOC FU Call Status:: Unsuccessful Call (3rd Attempt) Unsuccessful Call (3rd Attempt) Date: 07/29/23  Attempted to reach the patient regarding the most recent Inpatient/ED visit.  Follow Up Plan: No further outreach attempts will be made at this time. We have been unable to contact the patient.  Arvin Seip RN, BSN, CCM [employer], Population Health Case Manager Phone: [phone]

## 2023-08-02 ENCOUNTER — Encounter: Payer: Self-pay | Admitting: Internal Medicine

## 2023-08-02 ENCOUNTER — Ambulatory Visit (INDEPENDENT_AMBULATORY_CARE_PROVIDER_SITE_OTHER): Admitting: Internal Medicine

## 2023-08-02 VITALS — BP 112/68 | HR 56 | Temp 97.9°F | Ht 66.0 in | Wt 119.4 lb

## 2023-08-02 DIAGNOSIS — R5383 Other fatigue: Secondary | ICD-10-CM | POA: Diagnosis not present

## 2023-08-02 DIAGNOSIS — N1831 Chronic kidney disease, stage 3a: Secondary | ICD-10-CM | POA: Diagnosis not present

## 2023-08-02 DIAGNOSIS — N390 Urinary tract infection, site not specified: Secondary | ICD-10-CM

## 2023-08-02 DIAGNOSIS — K5900 Constipation, unspecified: Secondary | ICD-10-CM

## 2023-08-02 DIAGNOSIS — I10 Essential (primary) hypertension: Secondary | ICD-10-CM | POA: Diagnosis not present

## 2023-08-02 MED ORDER — POLYETHYLENE GLYCOL 3350 17 GM/SCOOP PO POWD: 17.0000 g | Freq: Every day | ORAL | 1 refills | Status: AC | PRN

## 2023-08-02 NOTE — Assessment & Plan Note (Signed)
-   Patient complains of fatigue/generalized weakness and states that her strength is not back to baseline -Will put in referral to home health PT -Recent blood work at ED visit on July 24 showed hemoglobin at baseline as well as GFR baseline -Troponins were negative -No further workup at this time

## 2023-08-02 NOTE — Patient Instructions (Addendum)
-   It was a pleasure meeting you today -I have put in MiraLAX  to take once a day as needed in addition to the senna to see if this will help with your constipation -I put in referral to home physical therapy for you.  They should contact you  to set up a follow-up appointment to help you regain your strength -You have no UTI symptoms currently and I suspect that your infection is now resolved. -Your blood pressure is well-controlled today.  Continue to hold the amlodipine  -Please contact us  with any questions or concerns or if you need any refills

## 2023-08-02 NOTE — Assessment & Plan Note (Signed)
-   Patient was admitted to the hospital from July 18 July 21 for weakness and was found to have an E. coli urinary tract infection with E. coli bacteremia -Patient was treated with Rocephin  and transition to an oral cephalosporin and has not completed her course of antibiotics -She denies any UTI symptoms currently -Transitions of care attempted to be phone calls but were unable to contact the patient -Medications were reviewed and were appropriate -No further workup at this time

## 2023-08-02 NOTE — Assessment & Plan Note (Signed)
-   This problem is chronic and stable -Patient had a recent ED visit for fatigue and generalized weakness on July 24.  GFR at that time was in the 50s which is at her baseline -No indication to repeat this at this time -Will continue to monitor closely -No further workup at this time

## 2023-08-02 NOTE — Assessment & Plan Note (Signed)
-   This problem is chronic and stable -Patient was on amlodipine  10 mg daily at home -Had a recent admission for UTI with E. coli bacteremia and at that admission was noted to have soft blood pressures -Amlodipine  was held at discharge -Blood pressure today was high at 112/68. -Will continue to hold amlodipine  at this time -Continue with propranolol  -No further workup for now

## 2023-08-02 NOTE — Assessment & Plan Note (Signed)
-   Patient does complain of constipation (last bowel movement was Friday morning) -Patient states that she normally has a bowel movement every other day -Patient has been taking senna without any improvement -She denies any abdominal pain and is tolerating oral intake.  No nausea or vomiting. -Abdominal exam shows normal bowel sounds with no tenderness or distention noted -Will add MiraLAX  once daily as needed to her regimen to see if this will help her have a bowel movement -No further workup at this time

## 2023-08-02 NOTE — Progress Notes (Signed)
 Acute Office Visit  Subjective:     Patient ID: [name], female    DOB: 04-17-1926, 89 y.o.   MRN: [id number]  Chief Complaint  Patient presents with   Hospitalization Follow-up    HPI Patient is in today for hospital follow-up for E. coli UTI with bacteremia.  Patient did have an additional ED visit on July 24 for fatigue.  Workup at that time was unrevealing.  Her CMP showed her creatinine and GFR at baseline.  Her CBC showed a stable hemoglobin of 10.1 with some thrombocytosis in the setting of recent infection.  Her UA showed small leukocytes but no other signs of an infection and she was on antibiotics at the time for her UTI.  Today, patient still complains of fatigue and states that her strength is not at baseline.  Home health PT was supposed to be ordered at discharge for the patient but no one has reached out to her yet.  Patient also complains of constipation since Friday morning.  Normally she has a bowel movement every other day.  She has been taking senna but it has not been helpful.  Review of Systems  Constitutional:  Positive for malaise/fatigue.  HENT: Negative.    Respiratory: Negative.    Cardiovascular: Negative.   Gastrointestinal:  Positive for constipation. Negative for abdominal pain, nausea and vomiting.  Genitourinary: Negative.  Negative for dysuria, frequency and urgency.  Neurological:        Complains of generalized weakness  Psychiatric/Behavioral: Negative.          Objective:    BP 112/68   Pulse (!) 56   Temp 97.9 F (36.6 C)   Ht 5' 6 (1.676 m)   Wt 119 lb 6.4 oz (54.2 kg)   SpO2 97%   BMI 19.27 kg/m    Physical Exam Constitutional:      Appearance: Normal appearance.  HENT:     Head: Normocephalic and atraumatic.  Cardiovascular:     Rate and Rhythm: Normal rate and regular rhythm.     Heart sounds: Murmur heard.     Comments: Grade 2 systolic murmur best heard at the right upper sternal border Pulmonary:      Effort: No respiratory distress.     Breath sounds: Normal breath sounds. No wheezing.  Abdominal:     General: Bowel sounds are normal. There is no distension.     Palpations: Abdomen is soft.     Tenderness: There is no abdominal tenderness.  Musculoskeletal:        General: No swelling or tenderness.  Neurological:     Mental Status: She is alert. Mental status is at baseline.  Psychiatric:        Mood and Affect: Mood normal.        Behavior: Behavior normal.     No results found for any visits on 08/02/23.      Assessment & Plan:   Problem List Items Addressed This Visit       Cardiovascular and Mediastinum   Essential hypertension   - This problem is chronic and stable -Patient was on amlodipine  10 mg daily at home -Had a recent admission for UTI with E. coli bacteremia and at that admission was noted to have soft blood pressures -Amlodipine  was held at discharge -Blood pressure today was high at 112/68. -Will continue to hold amlodipine  at this time -Continue with propranolol  -No further workup for now        Genitourinary  CKD stage 3a, GFR 45-59 ml/min (HCC)   - This problem is chronic and stable -Patient had a recent ED visit for fatigue and generalized weakness on July 24.  GFR at that time was in the 53s which is at her baseline -No indication to repeat this at this time -Will continue to monitor closely -No further workup at this time      UTI (urinary tract infection) - Primary   - Patient was admitted to the hospital from July 18 July 21 for weakness and was found to have an E. coli urinary tract infection with E. coli bacteremia -Patient was treated with Rocephin  and transition to an oral cephalosporin and has not completed her course of antibiotics -She denies any UTI symptoms currently -Transitions of care attempted to be phone calls but were unable to contact the patient -Medications were reviewed and were appropriate -No further workup at this  time      Relevant Orders   Ambulatory referral to Home Health     Other   Constipation   - Patient does complain of constipation (last bowel movement was Friday morning) -Patient states that she normally has a bowel movement every other day -Patient has been taking senna without any improvement -She denies any abdominal pain and is tolerating oral intake.  No nausea or vomiting. -Abdominal exam shows normal bowel sounds with no tenderness or distention noted -Will add MiraLAX  once daily as needed to her regimen to see if this will help her have a bowel movement -No further workup at this time      Relevant Medications   polyethylene glycol powder (GLYCOLAX /MIRALAX ) 17 GM/SCOOP powder   Fatigue   - Patient complains of fatigue/generalized weakness and states that her strength is not back to baseline -Will put in referral to home health PT -Recent blood work at ED visit on July 24 showed hemoglobin at baseline as well as GFR baseline -Troponins were negative -No further workup at this time      Relevant Orders   Ambulatory referral to Home Health    Meds ordered this encounter  Medications   polyethylene glycol powder (GLYCOLAX /MIRALAX ) 17 GM/SCOOP powder    Sig: Take 17 g by mouth daily as needed.    Dispense:  3350 g    Refill:  1    No follow-ups on file.  Livie Vanderhoof, MD

## 2023-08-04 ENCOUNTER — Telehealth: Payer: Self-pay | Admitting: *Deleted

## 2023-08-04 ENCOUNTER — Telehealth: Payer: Self-pay

## 2023-08-04 DIAGNOSIS — I129 Hypertensive chronic kidney disease with stage 1 through stage 4 chronic kidney disease, or unspecified chronic kidney disease: Secondary | ICD-10-CM | POA: Diagnosis not present

## 2023-08-04 DIAGNOSIS — N1831 Chronic kidney disease, stage 3a: Secondary | ICD-10-CM | POA: Diagnosis not present

## 2023-08-04 DIAGNOSIS — Z602 Problems related to living alone: Secondary | ICD-10-CM | POA: Diagnosis not present

## 2023-08-04 DIAGNOSIS — K5900 Constipation, unspecified: Secondary | ICD-10-CM | POA: Diagnosis not present

## 2023-08-04 DIAGNOSIS — M40209 Unspecified kyphosis, site unspecified: Secondary | ICD-10-CM | POA: Diagnosis not present

## 2023-08-04 DIAGNOSIS — Z79899 Other long term (current) drug therapy: Secondary | ICD-10-CM | POA: Diagnosis not present

## 2023-08-04 DIAGNOSIS — Z9181 History of falling: Secondary | ICD-10-CM | POA: Diagnosis not present

## 2023-08-04 DIAGNOSIS — Z5982 Transportation insecurity: Secondary | ICD-10-CM | POA: Diagnosis not present

## 2023-08-04 DIAGNOSIS — N390 Urinary tract infection, site not specified: Secondary | ICD-10-CM | POA: Diagnosis not present

## 2023-08-04 NOTE — Telephone Encounter (Signed)
 Copied from CRM [id number]. Topic: Clinical - Home Health Verbal Orders >> Aug 04, 2023  3:11 PM Zebedee SAUNDERS wrote: Caller/Agency: [facility] home health per Alfredia Belton Callback Number: [phone] secure line Service Requested: Physical Therapy Frequency: 1w 1, 2w2, 1w2 Any new concerns about the patient? No

## 2023-08-04 NOTE — Telephone Encounter (Signed)
Verbal orders for PT given.  

## 2023-08-04 NOTE — Telephone Encounter (Signed)
 Return call to [facility] - informed not a pt at [facility].

## 2023-08-04 NOTE — Telephone Encounter (Signed)
 Copied from CRM [id number]. Topic: Clinical - Home Health Verbal Orders >> Aug 04, 2023  2:56 PM Mace SQUIBB wrote: Caller/Agency: [facility] Callback Number: [phone] Service Requested: Physical Therapy Frequency: 1 week 1, 2 week 2, 1 week 2 Any new concerns about the patient? No

## 2023-08-09 DIAGNOSIS — Z79899 Other long term (current) drug therapy: Secondary | ICD-10-CM | POA: Diagnosis not present

## 2023-08-09 DIAGNOSIS — Z9181 History of falling: Secondary | ICD-10-CM | POA: Diagnosis not present

## 2023-08-09 DIAGNOSIS — Z602 Problems related to living alone: Secondary | ICD-10-CM | POA: Diagnosis not present

## 2023-08-09 DIAGNOSIS — K5900 Constipation, unspecified: Secondary | ICD-10-CM | POA: Diagnosis not present

## 2023-08-09 DIAGNOSIS — N1831 Chronic kidney disease, stage 3a: Secondary | ICD-10-CM | POA: Diagnosis not present

## 2023-08-09 DIAGNOSIS — Z5982 Transportation insecurity: Secondary | ICD-10-CM | POA: Diagnosis not present

## 2023-08-09 DIAGNOSIS — I129 Hypertensive chronic kidney disease with stage 1 through stage 4 chronic kidney disease, or unspecified chronic kidney disease: Secondary | ICD-10-CM | POA: Diagnosis not present

## 2023-08-09 DIAGNOSIS — M40209 Unspecified kyphosis, site unspecified: Secondary | ICD-10-CM | POA: Diagnosis not present

## 2023-08-11 DIAGNOSIS — Z79899 Other long term (current) drug therapy: Secondary | ICD-10-CM | POA: Diagnosis not present

## 2023-08-11 DIAGNOSIS — Z5982 Transportation insecurity: Secondary | ICD-10-CM | POA: Diagnosis not present

## 2023-08-11 DIAGNOSIS — N1831 Chronic kidney disease, stage 3a: Secondary | ICD-10-CM | POA: Diagnosis not present

## 2023-08-11 DIAGNOSIS — N390 Urinary tract infection, site not specified: Secondary | ICD-10-CM | POA: Diagnosis not present

## 2023-08-11 DIAGNOSIS — Z602 Problems related to living alone: Secondary | ICD-10-CM | POA: Diagnosis not present

## 2023-08-11 DIAGNOSIS — K5900 Constipation, unspecified: Secondary | ICD-10-CM | POA: Diagnosis not present

## 2023-08-11 DIAGNOSIS — M40209 Unspecified kyphosis, site unspecified: Secondary | ICD-10-CM | POA: Diagnosis not present

## 2023-08-11 DIAGNOSIS — I129 Hypertensive chronic kidney disease with stage 1 through stage 4 chronic kidney disease, or unspecified chronic kidney disease: Secondary | ICD-10-CM | POA: Diagnosis not present

## 2023-08-11 DIAGNOSIS — Z9181 History of falling: Secondary | ICD-10-CM | POA: Diagnosis not present

## 2023-08-12 DIAGNOSIS — H353221 Exudative age-related macular degeneration, left eye, with active choroidal neovascularization: Secondary | ICD-10-CM | POA: Diagnosis not present

## 2023-08-12 DIAGNOSIS — H3411 Central retinal artery occlusion, right eye: Secondary | ICD-10-CM | POA: Diagnosis not present

## 2023-08-17 DIAGNOSIS — I129 Hypertensive chronic kidney disease with stage 1 through stage 4 chronic kidney disease, or unspecified chronic kidney disease: Secondary | ICD-10-CM | POA: Diagnosis not present

## 2023-08-19 DIAGNOSIS — Z9181 History of falling: Secondary | ICD-10-CM | POA: Diagnosis not present

## 2023-08-23 DIAGNOSIS — Z602 Problems related to living alone: Secondary | ICD-10-CM | POA: Diagnosis not present

## 2023-08-23 DIAGNOSIS — N1831 Chronic kidney disease, stage 3a: Secondary | ICD-10-CM | POA: Diagnosis not present

## 2023-08-29 ENCOUNTER — Other Ambulatory Visit: Payer: Self-pay | Admitting: Internal Medicine

## 2023-09-08 ENCOUNTER — Telehealth: Payer: Self-pay

## 2023-09-08 NOTE — Telephone Encounter (Signed)
 Dr. Onesimo signed [facility] paperwork then I faxed it to [facility] at [phone]. I received an okay confirmation.

## 2023-09-15 ENCOUNTER — Telehealth: Payer: Self-pay

## 2023-09-15 NOTE — Telephone Encounter (Signed)
 [name] spoke with [facility] and they are going to fax over a new copy of the plan of care with Dr. Lula number on it.

## 2023-09-15 NOTE — Telephone Encounter (Signed)
 Copied from CRM [id number]. Topic: Clinical - Request for Lab/Test Order >> Sep 15, 2023 11:50 AM Logan F wrote: Reason for CRM: [facility] faxed over a plan of care on 8/05, 08/19, and 09/02 to be signed by pt pcp. Following up to see if it was recieved or signed.

## 2023-09-15 NOTE — Telephone Encounter (Signed)
 LMTCB with [facility] home [facility]. We need to have them refax the plan of care over with Dr. Lula name on it not Dr. Cherl name. Please give this information when they call back.

## 2023-09-17 DIAGNOSIS — I129 Hypertensive chronic kidney disease with stage 1 through stage 4 chronic kidney disease, or unspecified chronic kidney disease: Secondary | ICD-10-CM | POA: Diagnosis not present

## 2023-09-17 DIAGNOSIS — K5900 Constipation, unspecified: Secondary | ICD-10-CM | POA: Diagnosis not present

## 2023-09-17 DIAGNOSIS — B9620 Unspecified Escherichia coli [E. coli] as the cause of diseases classified elsewhere: Secondary | ICD-10-CM | POA: Diagnosis not present

## 2023-09-17 DIAGNOSIS — Z5982 Transportation insecurity: Secondary | ICD-10-CM | POA: Diagnosis not present

## 2023-09-17 DIAGNOSIS — N1831 Chronic kidney disease, stage 3a: Secondary | ICD-10-CM | POA: Diagnosis not present

## 2023-09-17 DIAGNOSIS — N390 Urinary tract infection, site not specified: Secondary | ICD-10-CM | POA: Diagnosis not present

## 2023-09-17 DIAGNOSIS — M40209 Unspecified kyphosis, site unspecified: Secondary | ICD-10-CM | POA: Diagnosis not present

## 2023-09-17 DIAGNOSIS — Z602 Problems related to living alone: Secondary | ICD-10-CM | POA: Diagnosis not present

## 2023-09-17 DIAGNOSIS — Z79899 Other long term (current) drug therapy: Secondary | ICD-10-CM | POA: Diagnosis not present

## 2023-09-17 DIAGNOSIS — Z9181 History of falling: Secondary | ICD-10-CM | POA: Diagnosis not present

## 2023-09-19 ENCOUNTER — Encounter: Payer: Self-pay | Admitting: *Deleted

## 2023-09-19 ENCOUNTER — Other Ambulatory Visit: Payer: Self-pay | Admitting: Internal Medicine

## 2023-09-19 NOTE — Telephone Encounter (Signed)
 Requesting: Valium  Contract: No UDS: N/A Last Visit: 04/21/2023 Next Visit: Visit date not found Last Refill: 08/23/2023   Please Advise

## 2023-11-22 ENCOUNTER — Other Ambulatory Visit: Payer: Self-pay | Admitting: Internal Medicine

## 2024-06-14 ENCOUNTER — Ambulatory Visit
